# Patient Record
Sex: Male | Born: 1935 | Race: White | Hispanic: No | Marital: Married | Smoking: Never smoker
Health system: Southern US, Community
[De-identification: ages and names within clinical notes are randomized; demographics above are authoritative.]

## PROBLEM LIST (undated history)

## (undated) DIAGNOSIS — K5790 Diverticulosis of intestine, part unspecified, without perforation or abscess without bleeding: Secondary | ICD-10-CM

## (undated) DIAGNOSIS — K562 Volvulus: Secondary | ICD-10-CM

## (undated) DIAGNOSIS — D369 Benign neoplasm, unspecified site: Secondary | ICD-10-CM

## (undated) DIAGNOSIS — N189 Chronic kidney disease, unspecified: Secondary | ICD-10-CM

## (undated) DIAGNOSIS — I4891 Unspecified atrial fibrillation: Secondary | ICD-10-CM

## (undated) DIAGNOSIS — I509 Heart failure, unspecified: Secondary | ICD-10-CM

## (undated) DIAGNOSIS — C61 Malignant neoplasm of prostate: Secondary | ICD-10-CM

## (undated) DIAGNOSIS — K602 Anal fissure, unspecified: Secondary | ICD-10-CM

## (undated) DIAGNOSIS — D7389 Other diseases of spleen: Secondary | ICD-10-CM

## (undated) DIAGNOSIS — K219 Gastro-esophageal reflux disease without esophagitis: Secondary | ICD-10-CM

## (undated) DIAGNOSIS — D649 Anemia, unspecified: Secondary | ICD-10-CM

## (undated) DIAGNOSIS — M1990 Unspecified osteoarthritis, unspecified site: Secondary | ICD-10-CM

## (undated) DIAGNOSIS — D739 Disease of spleen, unspecified: Secondary | ICD-10-CM

## (undated) DIAGNOSIS — I639 Cerebral infarction, unspecified: Secondary | ICD-10-CM

## (undated) DIAGNOSIS — I739 Peripheral vascular disease, unspecified: Secondary | ICD-10-CM

## (undated) DIAGNOSIS — K648 Other hemorrhoids: Secondary | ICD-10-CM

## (undated) DIAGNOSIS — M436 Torticollis: Secondary | ICD-10-CM

## (undated) HISTORY — DX: Malignant neoplasm of prostate: C61

## (undated) HISTORY — PX: HERNIA REPAIR: SHX51

## (undated) HISTORY — DX: Gastro-esophageal reflux disease without esophagitis: K21.9

## (undated) HISTORY — DX: Benign neoplasm, unspecified site: D36.9

## (undated) HISTORY — DX: Unspecified osteoarthritis, unspecified site: M19.90

## (undated) HISTORY — DX: Other diseases of spleen: D73.89

## (undated) HISTORY — PX: COLONOSCOPY: SHX174

## (undated) HISTORY — DX: Chronic kidney disease, unspecified: N18.9

## (undated) HISTORY — DX: Peripheral vascular disease, unspecified: I73.9

## (undated) HISTORY — PX: HEMORRHOID SURGERY: SHX153

## (undated) HISTORY — PX: JOINT REPLACEMENT: SHX530

## (undated) HISTORY — DX: Other hemorrhoids: K64.8

## (undated) HISTORY — DX: Diverticulosis of intestine, part unspecified, without perforation or abscess without bleeding: K57.90

## (undated) HISTORY — PX: TONSILLECTOMY: SHX5217

## (undated) HISTORY — PX: LUMBAR LAMINECTOMY: SHX95

## (undated) HISTORY — DX: Torticollis: M43.6

## (undated) HISTORY — DX: Disease of spleen, unspecified: D73.9

## (undated) HISTORY — DX: Anal fissure, unspecified: K60.2

---

## 1997-10-22 ENCOUNTER — Other Ambulatory Visit: Admission: RE | Admit: 1997-10-22 | Discharge: 1997-10-22 | Payer: Self-pay | Admitting: Cardiovascular Disease

## 1997-12-01 ENCOUNTER — Other Ambulatory Visit: Admission: RE | Admit: 1997-12-01 | Discharge: 1997-12-01 | Payer: Self-pay | Admitting: Cardiovascular Disease

## 1998-04-12 DIAGNOSIS — K5790 Diverticulosis of intestine, part unspecified, without perforation or abscess without bleeding: Secondary | ICD-10-CM

## 1998-04-12 HISTORY — DX: Diverticulosis of intestine, part unspecified, without perforation or abscess without bleeding: K57.90

## 1998-09-06 ENCOUNTER — Encounter: Payer: Self-pay | Admitting: Orthopedic Surgery

## 1998-09-08 ENCOUNTER — Inpatient Hospital Stay (HOSPITAL_COMMUNITY): Admission: RE | Admit: 1998-09-08 | Discharge: 1998-09-15 | Payer: Self-pay | Admitting: Orthopedic Surgery

## 1998-09-08 ENCOUNTER — Encounter: Payer: Self-pay | Admitting: Orthopedic Surgery

## 1999-11-16 ENCOUNTER — Other Ambulatory Visit: Admission: RE | Admit: 1999-11-16 | Discharge: 1999-11-16 | Payer: Self-pay | Admitting: Urology

## 2000-02-12 ENCOUNTER — Encounter: Payer: Self-pay | Admitting: General Surgery

## 2000-02-13 ENCOUNTER — Encounter (INDEPENDENT_AMBULATORY_CARE_PROVIDER_SITE_OTHER): Payer: Self-pay

## 2000-02-13 ENCOUNTER — Ambulatory Visit (HOSPITAL_COMMUNITY): Admission: RE | Admit: 2000-02-13 | Discharge: 2000-02-13 | Payer: Self-pay | Admitting: General Surgery

## 2000-05-03 ENCOUNTER — Encounter: Payer: Self-pay | Admitting: Orthopedic Surgery

## 2000-05-08 ENCOUNTER — Encounter: Payer: Self-pay | Admitting: Orthopedic Surgery

## 2000-05-08 ENCOUNTER — Inpatient Hospital Stay (HOSPITAL_COMMUNITY): Admission: RE | Admit: 2000-05-08 | Discharge: 2000-05-14 | Payer: Self-pay | Admitting: Orthopedic Surgery

## 2000-07-10 ENCOUNTER — Encounter: Admission: RE | Admit: 2000-07-10 | Discharge: 2000-10-08 | Payer: Self-pay | Admitting: Orthopedic Surgery

## 2005-08-09 ENCOUNTER — Encounter: Admission: RE | Admit: 2005-08-09 | Discharge: 2005-08-09 | Payer: Self-pay | Admitting: Neurology

## 2005-09-17 ENCOUNTER — Encounter: Admission: RE | Admit: 2005-09-17 | Discharge: 2005-09-17 | Payer: Self-pay | Admitting: Family Medicine

## 2005-09-20 ENCOUNTER — Encounter: Admission: RE | Admit: 2005-09-20 | Discharge: 2005-09-20 | Payer: Self-pay | Admitting: Family Medicine

## 2005-10-08 ENCOUNTER — Inpatient Hospital Stay (HOSPITAL_COMMUNITY): Admission: RE | Admit: 2005-10-08 | Discharge: 2005-10-09 | Payer: Self-pay | Admitting: Neurosurgery

## 2007-03-24 ENCOUNTER — Ambulatory Visit (HOSPITAL_COMMUNITY): Admission: RE | Admit: 2007-03-24 | Discharge: 2007-03-24 | Payer: Self-pay | Admitting: Cardiovascular Disease

## 2007-07-29 ENCOUNTER — Encounter (HOSPITAL_BASED_OUTPATIENT_CLINIC_OR_DEPARTMENT_OTHER): Admission: RE | Admit: 2007-07-29 | Discharge: 2007-09-04 | Payer: Self-pay | Admitting: Surgery

## 2007-12-03 ENCOUNTER — Encounter: Admission: RE | Admit: 2007-12-03 | Discharge: 2007-12-03 | Payer: Self-pay | Admitting: Family Medicine

## 2009-01-14 ENCOUNTER — Encounter: Admission: RE | Admit: 2009-01-14 | Discharge: 2009-01-14 | Payer: Self-pay | Admitting: Family Medicine

## 2009-03-10 ENCOUNTER — Ambulatory Visit (HOSPITAL_COMMUNITY): Admission: RE | Admit: 2009-03-10 | Discharge: 2009-03-10 | Payer: Self-pay | Admitting: Cardiovascular Disease

## 2009-07-11 ENCOUNTER — Encounter (HOSPITAL_BASED_OUTPATIENT_CLINIC_OR_DEPARTMENT_OTHER): Admission: RE | Admit: 2009-07-11 | Discharge: 2009-09-12 | Payer: Self-pay | Admitting: Internal Medicine

## 2009-08-29 ENCOUNTER — Encounter (HOSPITAL_COMMUNITY): Admission: RE | Admit: 2009-08-29 | Discharge: 2009-11-27 | Payer: Self-pay | Admitting: Urology

## 2009-09-26 ENCOUNTER — Encounter: Admission: RE | Admit: 2009-09-26 | Discharge: 2009-09-26 | Payer: Self-pay | Admitting: Orthopaedic Surgery

## 2010-03-01 ENCOUNTER — Emergency Department (HOSPITAL_COMMUNITY): Admission: EM | Admit: 2010-03-01 | Discharge: 2010-03-01 | Payer: Self-pay | Admitting: Emergency Medicine

## 2010-04-20 ENCOUNTER — Encounter
Admission: RE | Admit: 2010-04-20 | Discharge: 2010-04-27 | Payer: Self-pay | Source: Home / Self Care | Attending: Physical Medicine & Rehabilitation | Admitting: Physical Medicine & Rehabilitation

## 2010-04-27 ENCOUNTER — Ambulatory Visit: Payer: Self-pay | Admitting: Physical Medicine & Rehabilitation

## 2010-05-04 ENCOUNTER — Ambulatory Visit (HOSPITAL_COMMUNITY)
Admission: RE | Admit: 2010-05-04 | Discharge: 2010-05-04 | Payer: Self-pay | Admitting: Physical Medicine & Rehabilitation

## 2010-07-07 ENCOUNTER — Encounter
Admission: RE | Admit: 2010-07-07 | Discharge: 2010-07-11 | Payer: Self-pay | Source: Home / Self Care | Attending: Physical Medicine & Rehabilitation | Admitting: Physical Medicine & Rehabilitation

## 2010-07-11 ENCOUNTER — Ambulatory Visit
Admission: RE | Admit: 2010-07-11 | Discharge: 2010-07-11 | Payer: Self-pay | Source: Home / Self Care | Attending: Physical Medicine & Rehabilitation | Admitting: Physical Medicine & Rehabilitation

## 2010-08-15 ENCOUNTER — Ambulatory Visit: Payer: Medicare Other | Admitting: Physical Medicine & Rehabilitation

## 2010-08-15 ENCOUNTER — Encounter: Payer: Medicare Other | Attending: Physical Medicine & Rehabilitation

## 2010-08-15 DIAGNOSIS — G243 Spasmodic torticollis: Secondary | ICD-10-CM | POA: Insufficient documentation

## 2010-08-15 DIAGNOSIS — R259 Unspecified abnormal involuntary movements: Secondary | ICD-10-CM | POA: Insufficient documentation

## 2010-09-07 LAB — DIFFERENTIAL
Basophils Absolute: 0 10*3/uL (ref 0.0–0.1)
Basophils Relative: 1 % (ref 0–1)
Eosinophils Absolute: 0 10*3/uL (ref 0.0–0.7)
Monocytes Absolute: 0.3 10*3/uL (ref 0.1–1.0)
Monocytes Relative: 8 % (ref 3–12)
Neutrophils Relative %: 67 % (ref 43–77)

## 2010-09-07 LAB — CK TOTAL AND CKMB (NOT AT ARMC): Total CK: 182 U/L (ref 7–232)

## 2010-09-07 LAB — BRAIN NATRIURETIC PEPTIDE: Pro B Natriuretic peptide (BNP): 38.7 pg/mL (ref 0.0–100.0)

## 2010-09-07 LAB — BASIC METABOLIC PANEL
CO2: 27 mEq/L (ref 19–32)
Calcium: 8.9 mg/dL (ref 8.4–10.5)
Chloride: 107 mEq/L (ref 96–112)
Creatinine, Ser: 1.29 mg/dL (ref 0.4–1.5)
GFR calc Af Amer: >60 mL/min (ref >60)
GFR calc non Af Amer: 53 mL/min — ABNORMAL LOW (ref >60)
Sodium: 141 mEq/L (ref 135–145)

## 2010-09-07 LAB — CBC
RBC: 3.91 MIL/uL — ABNORMAL LOW (ref 4.22–5.81)
RDW: 16.4 % — ABNORMAL HIGH (ref 11.5–15.5)
WBC: 4.1 10*3/uL (ref 4.0–10.5)

## 2010-09-19 ENCOUNTER — Encounter: Payer: Medicare Other | Attending: Physical Medicine & Rehabilitation

## 2010-09-19 ENCOUNTER — Ambulatory Visit: Payer: Medicare Other | Admitting: Physical Medicine & Rehabilitation

## 2010-09-19 DIAGNOSIS — G243 Spasmodic torticollis: Secondary | ICD-10-CM

## 2010-09-19 DIAGNOSIS — M542 Cervicalgia: Secondary | ICD-10-CM

## 2010-09-19 DIAGNOSIS — R259 Unspecified abnormal involuntary movements: Secondary | ICD-10-CM | POA: Insufficient documentation

## 2010-09-26 NOTE — Procedures (Signed)
Eduardo Deleon, ROSKOS NO.:  [id number]  MEDICAL RECORD NO.:  [id number]           PATIENT TYPE:  LOCATION:                                 FACILITY:  PHYSICIAN:  Erick Colace, M.D.DATE OF BIRTH:  1925-06-23  DATE OF PROCEDURE: DATE OF DISCHARGE:                              OPERATIVE REPORT  This is an EMG report.  This EMG without nerve conduction studies is done on multiple cervical musculature.  This is done for the purposes of evaluating cervical dystonia and planning for further injections.  He did not have good response to Dysport injection 300 units, further mapping was obtained.  Following, muscles were studied, right sternocleidomastoid 1+ motor unit action potential activity, left 3+; left scalenes were rated as 2+, right levator scapula 1+, left levator scapula 3+; right splenius capitis 1+, left 3+; and right trapezius 2+, left trapezius 2+.  No signs of abnormal spontaneous activity but this was muscle activity when the patient was trying to be relaxed.  His head tends to deviate downward toward the left side and lateral collis  to left side as well.  The patient will be treated with Botox at next visit.  I discussed with the patient, he agrees with plan, we will plan 200 units.  75 Units L SCM, 50 Units L scalenes, 25 Units L Splenius capitus, 50 Units L Levator scapula     Erick Colace, M.D. Electronically Signed    AEK/MEDQ  D:  09/19/2010 16:53:10  T:  09/20/2010 00:42:23  Job:  [id number]

## 2010-09-29 LAB — PROTIME-INR: Prothrombin Time: 26 seconds — ABNORMAL HIGH (ref 11.6–15.2)

## 2010-11-07 NOTE — Op Note (Signed)
NAMEDEQUAN, KINDRED NO.:  [id number]   MEDICAL RECORD NO.:  [id number]          PATIENT TYPE:  OIB   LOCATION:  2899                         FACILITY:  [facility]   PHYSICIAN:  Richard A. Alanda Amass, M.D.DATE OF BIRTH:  May 16, 1925   DATE OF PROCEDURE:  03/24/2007  DATE OF DISCHARGE:  03/24/2007                               OPERATIVE REPORT   PROCEDURE:  DC cardioversion under IV pentathol anesthesia administered  by Dr. Randa Evens anesthesiologist.   HISTORY:  Mr. Gerstner is an 77 year old white married gentleman  without children.  He is a nonsmoker and was initially seen by me for  evaluation of tachycardia and LV dysfunction with new onset hypertension  and abnormal 2D echo on October 16, 2006.  He was found to be in atrial  flutter with variable response.  He had symptoms of episodic mild  dyspnea and exercise intolerance associated with this.  No chest pain,  syncope or presyncope.  He has had a remote CVA in 1987 under the care  of Dr. Avie Echevaria and had been chronic aspirin and there has been no  recurrences.  A 2D echo in April 2008 showed EF of 35% to 45% with AF  with global hypokinesia, moderate atrial enlargement, and DUST with no  significant valvular disease and mild pulmonary hypertension.  He was  anticoagulated and treated with rate control and seen back as an  outpatient November 26, 2006.  It was elected to proceed with a DC  cardioversion in this setting with atrial flutter-fibrillation.  Length  unknown.  Symptoms as outlined above.  LV dysfunction to be probably  rate related.  The patient has been on adequate anticoagulant documented  as an outpatient.  Had been on digoxin 0.25, Coumadin, Cardizem CD 240,  one aspirin, Flomax, Toprol-XL 25 mg.  Informed consent had been  obtained to proceed with the DC cardioversion.  Physical examination had  not changed.  Blood pressure was stable at 120 to 130.  He was in atrial  fibrillation with ventricular  response of 80 to 90 per minute.  Fluttered waves were not visualized.   He was given a 125 mg of IV pentathol by Dr. Randa Evens for sedation.  A  single shock of 150 joules biphasic with AP paddles was given and  synchronized.  The patient converted to sinus rhythm at 65 per minute.  He awoke in outpatient area without any neurologic sequelae and  tolerated the procedure well.   He will be followed as an outpatient.  We will cut him down to two baby  aspirin a day since he is on Coumadin and continue to follow him as an  outpatient with medication adjustment as outlined.  We will plan to get  followup LV function if he maintains sinus rhythm in the hopes that this  will improve.      Richard A. Alanda Amass, M.D.  Electronically Signed     RAW/MEDQ  D:  03/24/2007  T:  03/25/2007  Job:  [id number]   cc:   [address] Dr.

## 2010-11-07 NOTE — Assessment & Plan Note (Signed)
[facility]   NAME:  Eduardo, Deleon             ACCOUNT NO.:  [id number]   MEDICAL RECORD NO.:  [id number]      DATE OF BIRTH:  Nov 01, 1924   PHYSICIAN:  Theresia Majors. Tanda Rockers, M.D. VISIT DATE:  08/14/2007                                   OFFICE VISIT   SUBJECTIVE:  [name] is an 77 year old man whom we have treated  for stasis ulcer involving his left lower extremity.  He in the interim  he has worn compressive wrap.  There is been no fever or drainage.   OBJECTIVE:  Blood pressure is 128/80, respirations 18, pulse rate 110,  temperature 98.3.  Inspection of the left lower extremity shows that the ulcers completely  resolved.  There remains a 1 to 2+ edema.  There is no inflammation and  no evidence of ischemia.   ASSESSMENT:  Resolved stasis ulcer.   PLAN:  We have made the transition of the patient from the compressive  wrap to of bilateral open toed compression garment of 30-40 mm of  compression to a 20-30 mm gradient.  We will discharge him from active  management in the [facility] and encourage him to keep his follow-up  with his primary care physician Dr. Coralee Pesa.      [name], M.D.  Electronically Signed     HAN/MEDQ  D:  08/14/2007  T:  08/15/2007  Job:  [id number]

## 2010-11-07 NOTE — Consult Note (Signed)
NAMECARON, TARDIF NO.:  [id number]   MEDICAL RECORD NO.:  [id number]          PATIENT TYPE:  REC   LOCATION:  FOOT                         FACILITY:  [facility]   PHYSICIAN:  Theresia Majors. Tanda Rockers, M.D.DATE OF BIRTH:  06-10-1925   DATE OF CONSULTATION:  07/31/2007  DATE OF DISCHARGE:                                 CONSULTATION   SUBJECTIVE:  [name] is an 77 year old man referred by Dr.  Leodis Rains for evaluation of nonhealing wound on the  medial left lower extremity.   IMPRESSION:  Stasis ulceration.   RECOMMENDATIONS:  Proceed with a compression multilayered wrap with  triamcinolone ointment and reevaluation of the patient in one week.   SUBJECTIVE:  [name] is an 77 year old man who noticed severe  swelling and erythema approximately 3 weeks ago.  This area was  subsequently associated with an ulceration and drainage.  There has been  no fever.  There has been no acknowledged trauma.   PAST MEDICAL HISTORY:  1. Chronic atrial fibrillation.  2. Hypertension.  3. Arthritis.   ALLERGIES:  He denies allergies.   CURRENT MEDICATIONS:  1. Avodart 0.5 mg daily.  2. Toprol XL 50 mg daily.  3. Omeprazole 20 mg daily.  4. Lanoxin 0.25 mg daily.  5. Coumadin 7 mg daily.  6. Aspirin 81 mg daily.  7. Diclofenac sodium 50 mg daily.  8. Fosamax 70 mg weekly.  9. Over-the-counter medications include Centrum Silver, calcium,      Metamucil and Colace.   PAST SURGICAL HISTORY:  1. Bilateral hip replacements for degenerative disease.  2. He has had a cardioversion.  3. Herniorrhaphy.   FAMILY HISTORY:  Positive for hypertension, negative for diabetes,  negative for cancer, positive for strokes.   SOCIAL HISTORY:  He is married.  He has no children.  He had his wife  live in [place].   REVIEW OF SYSTEMS:  The patient is relatively active.  He is able to  walk four blocks without difficulty.  He is able to negotiate a flight  of  stairs.  He denies syncope.  He did have some visual impairments  attributed to a mini stroke approximately 6 years ago.  The onset of his  atrial fibrillation was this past October.  He has never smoked.  He  denies hemoptysis.  His appetite is good.  His weight is stable.  He  specifically denies angina pectoris.  His bowel and bladder function are  normal.  His total knee replacements have resulted in relief of all of  his previously bothersome knee pain.  The remainder of his review of  systems is negative.   PHYSICAL EXAMINATION:  GENERAL:  He is an alert pleasant man in no acute  distress.  He is in good contact with reality.  VITAL SIGNS:  Blood pressure is 132/84, respirations 18, temperature is  98, pulse rate is 110 and irregularly irregular.  HEENT:  Exam is clear.  There are prominent V-waves.  Trachea is  midline.  Thyroid is nonpalpable.  LUNGS:  Clear.  HEART:  Sounds were distant with irregularity noted.  ABDOMEN:  Soft with no tenderness.  EXTREMITIES:  The extremity exam is remarkable for bilateral 2+ edema  with chronic changes of stasis the dorsalis pedis pulse is palpable  bilaterally.  On the left medial ankle is a full-thickness ulceration  with a halo of erythema and associated desquamation.  Protective  sensation is preserved as judged by the Semmes-Weinstein filament.   DISCUSSION:  [name] has a classic stasis ulcer with  compression.  We have given him a prescription for 30-40 mm below-the-  knee compression hose.  We have instructed him to procure the hose and  to begin wearing it on the right lower extremity, we anticipate that his  left stasis ulcer should respond to compression therapy.  We will  reevaluate him in one week.  We have given him an opportunity to ask  questions.  He seems to understand and expresses gratitude for having  been seen in the clinic.      [name], M.D.  Electronically Signed     HAN/MEDQ  D:   07/31/2007  T:  08/01/2007  Job:  [id number]   cc:   Samara Snide, MD

## 2010-11-07 NOTE — Assessment & Plan Note (Signed)
[facility]   NAME:  CLAYSON, RILING             ACCOUNT NO.:  [id number]   MEDICAL RECORD NO.:  [id number]      DATE OF BIRTH:  1925-02-12   PHYSICIAN:  Theresia Majors. Tanda Rockers, M.D. VISIT DATE:  08/07/2007                                   OFFICE VISIT   SUBJECTIVE:  Mr. Ditmer is a 77 year old man who we are seeing for  stasis ulcer involving the medial aspect of his left lower extremity.  In the interim he has worn a compressive wrap.  He repots that there has  been no drainage, mal odor, pain or fever.   OBJECTIVE:  Blood pressure 138/78, respirations 18, pulse rate 101,  temperature 97.7.  Inspection of the left medial ankle shows that there is a miniscule  residual ulcer with no evidence of inflammation or infection.  Capillary  refill is brisk.   ASSESSMENT:  Clinical response to compression.   PLAN:  We will resume the compression wrap with PCA ointment.  We will  reevaluate the patient in one week p.r.n.  In the interim the patient  has procured the recommended 30 to 40 mm below the knee compression  hose.  He will bring these stockings with him to his next visit, and  hopefully he will make the transition.      [name], M.D.  Electronically Signed     HAN/MEDQ  D:  08/07/2007  T:  08/08/2007  Job:  [id number]

## 2010-11-10 NOTE — Op Note (Signed)
[place]  Patient:    Eduardo Deleon, Eduardo Deleon                    MRN: [id number] Proc. Date: 05/08/00 Adm. Date:  [id number] Attending:  Colbert Ewing                           Operative Report  PREOPERATIVE DIAGNOSIS:  End-stage degenerative joint disease, right knee, with varus alignment and mild flexion contracture.  POSTOPERATIVE DIAGNOSIS:  End-stage degenerative joint disease, right knee, with varus alignment and mild flexion contracture.  PROCEDURE:  Right total knee replacement utilizing [employer] prosthesis. Press-fit cruciate-retaining #9 femoral component.  Cemented #9 tibial component with 15 mm polyethylene insert.  Cemented recessed non-metal backed 30 mm patellar component.  Appropriate soft tissue balancing with lateral retinacular release.  SURGEON:  Loreta Ave, M.D.  ASSISTANT:  Arlys Jarrett D. Petrarca, P.A.-C.  ANESTHESIA:  General.  ESTIMATED BLOOD LOSS:  Minimal.  TOURNIQUET TIME:  1 hour 10 minutes.  SPECIMENS:  Excised bone and soft tissue.  CULTURES:  None.  COMPLICATIONS:  None.  DRESSING:  Soft compressive.  DRAIN:  Hemovac x 2.  DESCRIPTION OF PROCEDURE:  Patient brought to the operating room and placed on the operating table in the supine position.  After adequate anesthesia had been obtained, the right knee was examined.  A 5 degree flexion contracture, further flexion to 100 degrees.  Alignment in varus, barely correctable to neutral.  Lateral patellofemoral tracking and tethering.  Tourniquet applied, prepped and draped in the usual sterile fashion.  Exsanguinated with elevation and Esmarch, tourniquet inflated to 350 mmHg.  Straight incision above the patella down to the tibial tubercle.  Medial parapatellar arthrotomy, exposing the knee.  Reactive clear effusion.  The knee exposed.  Remnants of menisci, anterior cruciate ligament, intra-articular adhesions all excised.  Extensive periarticular  spurs all debrided back to normal contour of the bony structures.  Distal femur exposed.  Extramedullary guide placed.  Distal cut set at 5 degrees of valgus, removing 10 mm.  Sized for a #9 component.  Jig was put in place, definitive cuts made for the cruciate-retaining #9 component.  Trial put in place and found to fit well.  Trial removed.  Tibial spine cut smoothly with a saw.  Posterior cruciate ligament and popliteal tendon retained.  The intramedullary guide placed in the tibia. Proximal cut, removing 4 mm off the deficient medial side with a 5 degree posterior slope cut.  Trials put in place.  A #9 above and below with a 15 mm insert, which gave the knee full extension, full flexion, and good stability after I did appropriate soft tissue release medially.  Tibia marked for rotation.  It was then hand-reamed for the definitive component.  Trials were removed.  Patella was then sized, reamed, and drilled for the 30 mm component.  All trials put in place.  Full extension, full flexion, nicely balanced knee at 5 degrees of valgus. Lateral tracking and tethering, requiring lateral release, which was performed with cautery from inside out.  Instruments and fluid removed.  The knee was then copiously irrigated with a pulse irrigating device.  Cement prepared and placed on tibial component, which was hammered in place. Polyethylene attached after excessive cement removed.  Patellar component was well-seated and cemented with excessive cement removed.  Femoral component hammered in place.  The knee was held in good position until the cement  hardened.  It was then re-examined.  Full extension, full flexion, and excellent stability.  No component lift-off with flexion.  Good patellofemoral tracking after lateral release.  Wound irrigated.  Hemovac was placed, brought out through separate stab wounds.  Arthrotomy closed with #1 Vicryl, and skin and subcutaneous tissue with Vicryl and staples.   Margins of the wound as well as the knee injected with Marcaine.  A sterile compressive dressing applied. Tourniquet deflated and removed.  Knee immobilizer applied.  Anesthesia reversed and brought to the recovery room.  He tolerated the surgery well with no complications. DD:  05/08/00 TD:  05/08/00 Job: [id number]

## 2010-11-10 NOTE — Op Note (Signed)
[facility]  Patient:    Eduardo Deleon, Eduardo Deleon                    MRN: [id number] Proc. Date: 02/13/00 Adm. Date:  [id number] Disc. Date: [id number] Attending:  Carson Myrtle                           Operative Report  PREOPERATIVE DIAGNOSIS:  Internal and external hemorrhoids, anal fissure.  POSTOPERATIVE DIAGNOSIS:  Internal and external hemorrhoids, anal fissure.  PROCEDURE:  Examination under anesthesia, repair of anal fissure, and hemorrhoidectomy.  SURGEON:  Timothy E. Earlene Plater, M.D.  ANESTHESIA:  General LMA.  INDICATIONS:  The patient has had a difficult time in the past year with anorectal complaints and problems.  He has been treated conservatively in the office with both medication, diet alteration, local wound care, and minor surgeries in the office.  He has continued to have pain, discomfort, and bleeding, and because of the persistence of an anal fissure and failure of the resumption to normal activity, resumption of healing, and the persistence of hemorrhoids, he has elected to proceed with surgery and risks were carefully discussed.  DESCRIPTION OF PROCEDURE:  The patient was brought to the operating room after prep at home, placed supine, and general LMA anesthesia provided.  He was placed in the lithotomy position.  The perianal area inspected, prepped and draped in the usual fashion.  Prominent hemorrhoids were noted to the right and left posterior position off the posterior midline fissure.  Each was thrombosed and quite large.  The fissure itself was acute and angry.  I was surprised that the anus and sphincters relaxed satisfactorily.  I injected Marcaine 0.5% with epinephrine around and about the anal orifice and massaged it in well.  There was no other pathology around the anorectum.  Each of the two hemorrhoids was removed in a similar fashion by placing a suture at the apex of 2-0 chromic, removing the external complex, and  then closing the wound with a running 2-0 chromic at the outside skin.  I was able to do this to remove the hemorrhoidal masses, and leave the skin aligning the fissure intact.  I did not want to remove a complex hemorrhoid on either side of the fissure which would have left the posterior of the anus bare and without mucosa.  However, this was accomplished satisfactorily.  There was skin anoderm and mucosa surrounding the fissure.  It was cauterized out to the perianal skin.  Because of the normal relaxation, I did not do a sphincterotomy in this elderly man.  Again, there was no other pathology, no bleeding, or complications.  Gelfoam gauze and a dry sterile dressing applied. He tolerated it well, was awakened, and taken to the recovery room in good condition.  Written and verbal instructions including Demerol were given to him, and he will be followed as an outpatient. DD:  02/15/00 TD:  02/16/00 Job: [id number]

## 2010-11-10 NOTE — Discharge Summary (Signed)
[place]. [facility]  Patient:    JOSETH, WEIGEL                    MRN: [id number] Adm. Date:  [id number] Disc. Date: [id number] Attending:  Colbert Ewing                           Discharge Summary  ADMISSION DIAGNOSIS:  Advanced degenerative joint disease of the right knee.  DISCHARGE DIAGNOSES: 1. Advanced degenerative joint disease of the right knee. 2. History of hypertension. 3. Constipation.  PROCEDURE:  Total knee replacement on the right.  HISTORY:  A 77 year old white male who has had long-standing difficulty with his right knee with pain/discomfort.  He is noted to have advanced DJD of the right knee radiographically.  He has been tried with conservative treatment but because of persistent pain and discomfort he is now indicated for total replacement.  HOSPITAL COURSE:  A 77 year old white male admitted May 08, 2000.  After appropriate laboratory studies were obtained patient was taken to the operating room where he underwent a right total knee replacement.  He tolerated the procedure well.  He was placed pre and postoperatively on Ancef prophylaxis.  Heparin and Coumadin antithrombotic prophylaxis was also ordered postoperatively ______ by [facility].  PT/OT rehabilitation consults were ordered.  Ambulation, weightbearing as tolerated on the right.  A PCA pump was used for postoperative pain as well as a femoral nerve block.  He had excellent relief in the first several days postoperative with his pain management.  He otherwise had an unremarkable hospital course and was discharged on the twentieth to return back to the office in follow-up in 10 days.  LABORATORIES:  EKG showed normal sinus rhythm with marked sinus arrhythmia with short TR, possible anterior infarct, age undetermined.  Radiographic studies showed satisfactory postoperative appearance of right knee. Preoperative hemoglobin 14.8, hematocrit 42.2%, white  count 7500, platelets 190,000.  Discharge hemoglobin 10.3, hematocrit 29.1%.  Chemistries preoperative:  Sodium 141, potassium 4.0, chloride 104, CO2 29, glucose 122, BUN 14, creatinine 1.1, calcium 9.6, total protein 6.8, albumin 4.0, AST 21, ALT 14, ALP 67, total bilirubin 1.0.  Discharge sodium 133, potassium 4.1, chloride 101, CO2 26, glucose 117, BUN 17, creatinine 0.8, calcium 7.8. Urinalysis was done for voided urine.  Blood type A-.  Antibody screen negative.  DISCHARGE INSTRUCTIONS:  Given prescription for Percocet 5/325 one to two q.4h. p.r.n. pain.  Iron sulfate 325 one p.o. q.d. with meals.  Coumadin 5 mg as directed by [facility] pharmacy.  Weightbearing as tolerated on the right. Regular house diet.  Keep the incision clean and dry.  Change the dressing daily.  Follow up in the office in 10 days for follow-up.  Call if there is any problem in the interim.  CONDITION ON DISCHARGE:  Improved. DD:  05/22/00 TD:  05/22/00 Job: [id number]

## 2010-11-10 NOTE — Op Note (Signed)
NAMEMARILYN, Deleon NO.:  [id number]   MEDICAL RECORD NO.:  [id number]          PATIENT TYPE:  INP   LOCATION:  3041                         FACILITY:  [facility]   PHYSICIAN:  Kathaleen Maser. Pool, M.D.    DATE OF BIRTH:  Dec 09, 1924   DATE OF PROCEDURE:  10/08/2005  DATE OF DISCHARGE:                                 OPERATIVE REPORT   PREOPERATIVE DIAGNOSES:  1.  Right T12-L1 herniated nucleus pulposus with radiculopathy.  2.  L3-4 stenosis.   POSTOPERATIVE DIAGNOSES:  1.  Right T12-L1 herniated nucleus pulposus with radiculopathy.  2.  L3-4 stenosis.   PROCEDURES:  1.  Right T12-L1 transpedicular microdiskectomy.  2.  Bilateral L3-4 decompressive laminotomies and foraminotomies.   SURGEON:  Kathaleen Maser. Pool, M.D.   ASSISTANT:  Donalee Citrin, M.D.   ANESTHESIA:  General orotracheal.   INDICATIONS:  [name] is a 77 year old male with a history of back  and lower extremity pain, paresthesias and weakness consistent with a mixed  problem.  Workup has demonstrated evidence of severe stenosis at L3-4.  The  patient also has evidence of a large right T12-L1 acute disk herniation.  We  have discussed options available for management, including the possibility  of undergoing  a microdiskectomy at T12-L1 and a lumbar decompression at L3-  4.  The patient is aware of the risks and benefits and wishes to proceed.   OPERATIVE NOTE:  The patient was taken to the operating room and placed on  the operating table in supine position.  After an adequate level of  anesthesia was achieved, the patient was positioned prone onto a Wilson  frame and appropriately padded.  The lumbar region was prepped and draped  sterilely.  A 10 blade was used to make a linear skin incision overlying the  L3-4 and T12-L1 levels.  Subperiosteal dissection then performed bilaterally  at L3-4 and on the right at T12-L1.  A deep self-retaining retractor was  placed, x-ray was taken and levels were  confirmed.  Starting at L3-4, a  decompressive laminotomy was then performed using a high-speed drill and  Kerrison used to remove the inferior aspect of the lamina of L3, medial  aspect of the L3-4 facet joint, and the superior rim of the L4 lamina.  The  ligamentum flavum was then elevated and resected in a piecemeal fashion  using the Kerrison rongeurs.  The facet joints were then undercut using the  microscope for wide foraminotomies of the exiting L4 nerve root.  This fully  decompressed the thecal sac and nerve roots.  There was no evidence of any  residual compression.  Attention then placed to the right side at T12-L1.  Laminotomy was then performed using high-speed drill and Kerrison rongeurs  to remove the inferior aspect of the lamina of T12, medial aspect of the T12-  L1 facet joint and the superior rim of the L1 lamina as well as the superior  aspect of the L1 pedicle.  Microscope brought into the field and used for  microdissection of the spinal canal.  The thecal sac was gently mobilized  and retracted toward the midline.  The disk herniation was readily apparent.  This was incised with a 15 blade in a rectangular fashion.  A wide disk  space clean-out was achieved using pituitary rongeurs, upward-angled  pituitary rongeurs and Epstein curettes.  All elements of the disk were  completely resected.  At this point a very thorough decompression had been  achieved.  There was no evidence of injury to the thecal sac or nerve roots.  Both wounds were then copiously irrigated with antibiotic solution.  Gelfoam  was placed topically for hemostasis.  The microscope and retractor system  were removed.  Hemostasis in the muscle was achieved with electrocautery.  The wound was then closed in layers with Vicryl sutures.  Steri-Strips and  sterile dressing were applied.  There were no apparent complications.  The  patient tolerated the procedure well, and he returns to the recovery room   postoperatively.           ______________________________  Kathaleen Maser Pool, M.D.     HAP/MEDQ  D:  10/08/2005  T:  10/08/2005  Job:  [id number]

## 2010-11-21 ENCOUNTER — Ambulatory Visit: Payer: Medicare Other | Admitting: Physical Medicine & Rehabilitation

## 2010-11-21 ENCOUNTER — Encounter: Payer: Medicare Other | Attending: Physical Medicine & Rehabilitation

## 2010-11-21 DIAGNOSIS — G243 Spasmodic torticollis: Secondary | ICD-10-CM | POA: Insufficient documentation

## 2010-11-21 DIAGNOSIS — R259 Unspecified abnormal involuntary movements: Secondary | ICD-10-CM | POA: Insufficient documentation

## 2010-11-22 NOTE — Procedures (Signed)
NAMELADONTE, VERSTRAETE NO.:  [id number]  MEDICAL RECORD NO.:  [id number]           PATIENT TYPE:  O  LOCATION:  TPC                          FACILITY:  [facility]  PHYSICIAN:  Erick Colace, M.D.DATE OF BIRTH:  11-Aug-1924  DATE OF PROCEDURE: DATE OF DISCHARGE:                              OPERATIVE REPORT  Botulinum toxin injection.  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  He has a left laterocollis primarily.  Informed consent was obtained after describing risks and benefits of the procedure with the patient.  These include bleeding, bruising, and infection.  He has reviewed the REMS form, I answered questions personally.  He elects to proceed.  Also signed [facility] standard consent form.  The patient placed in a seated position.  Area marked and prepped with Betadine after pre-marking the areas.  Then a 30-gauge 1 inch needle electrode under needle EMG guidance was inserted.  Positive strong EMG activity obtained.  In the left levator scapula, 50 units injected.  Strong needle EMG activity noted at splenius capitis, 75 units injected, divided into 2 sites. Intermittent moderate EMG activity at the left sternocleidomastoid, 50 units and intermittent mild-to-moderate left scalene, 25 units injected. The patient tolerated the procedure well.  Postprocedure instructions given.  We will see her back in 4 weeks.  Consider response, maybe able to shift the scalene and sternocleidomastoid dosage into the splenius capitis and levator scapula, putting on 100 units into each of those two.     Erick Colace, M.D. Electronically Signed    AEK/MEDQ  D:  11/21/2010 16:39:21  T:  11/22/2010 03:38:47  Job:  [id number]

## 2010-12-19 ENCOUNTER — Ambulatory Visit: Payer: Medicare Other | Admitting: Physical Medicine & Rehabilitation

## 2011-02-08 ENCOUNTER — Inpatient Hospital Stay (INDEPENDENT_AMBULATORY_CARE_PROVIDER_SITE_OTHER)
Admission: RE | Admit: 2011-02-08 | Discharge: 2011-02-08 | Disposition: A | Discharge status: Home / Self Care | Payer: Medicare Other | Source: Ambulatory Visit | Attending: Family Medicine | Admitting: Family Medicine

## 2011-02-08 DIAGNOSIS — M47812 Spondylosis without myelopathy or radiculopathy, cervical region: Secondary | ICD-10-CM

## 2011-02-08 DIAGNOSIS — L97909 Non-pressure chronic ulcer of unspecified part of unspecified lower leg with unspecified severity: Secondary | ICD-10-CM

## 2011-02-20 ENCOUNTER — Encounter (HOSPITAL_BASED_OUTPATIENT_CLINIC_OR_DEPARTMENT_OTHER): Payer: Medicare Other | Attending: General Surgery

## 2011-02-20 DIAGNOSIS — Z79899 Other long term (current) drug therapy: Secondary | ICD-10-CM | POA: Insufficient documentation

## 2011-02-20 DIAGNOSIS — R609 Edema, unspecified: Secondary | ICD-10-CM | POA: Insufficient documentation

## 2011-02-20 DIAGNOSIS — Z8673 Personal history of transient ischemic attack (TIA), and cerebral infarction without residual deficits: Secondary | ICD-10-CM | POA: Insufficient documentation

## 2011-02-20 DIAGNOSIS — Z8546 Personal history of malignant neoplasm of prostate: Secondary | ICD-10-CM | POA: Insufficient documentation

## 2011-02-20 DIAGNOSIS — I4891 Unspecified atrial fibrillation: Secondary | ICD-10-CM | POA: Insufficient documentation

## 2011-02-20 DIAGNOSIS — I83009 Varicose veins of unspecified lower extremity with ulcer of unspecified site: Secondary | ICD-10-CM | POA: Insufficient documentation

## 2011-02-27 ENCOUNTER — Encounter (HOSPITAL_BASED_OUTPATIENT_CLINIC_OR_DEPARTMENT_OTHER): Payer: Medicare Other | Attending: General Surgery

## 2011-02-27 DIAGNOSIS — Z79899 Other long term (current) drug therapy: Secondary | ICD-10-CM | POA: Insufficient documentation

## 2011-02-27 DIAGNOSIS — Z8673 Personal history of transient ischemic attack (TIA), and cerebral infarction without residual deficits: Secondary | ICD-10-CM | POA: Insufficient documentation

## 2011-02-27 DIAGNOSIS — L97909 Non-pressure chronic ulcer of unspecified part of unspecified lower leg with unspecified severity: Secondary | ICD-10-CM | POA: Insufficient documentation

## 2011-02-27 DIAGNOSIS — R609 Edema, unspecified: Secondary | ICD-10-CM | POA: Insufficient documentation

## 2011-02-27 DIAGNOSIS — I83009 Varicose veins of unspecified lower extremity with ulcer of unspecified site: Secondary | ICD-10-CM | POA: Insufficient documentation

## 2011-02-27 DIAGNOSIS — I4891 Unspecified atrial fibrillation: Secondary | ICD-10-CM | POA: Insufficient documentation

## 2011-02-27 DIAGNOSIS — Z8546 Personal history of malignant neoplasm of prostate: Secondary | ICD-10-CM | POA: Insufficient documentation

## 2011-04-05 LAB — PROTIME-INR: INR: 2 — ABNORMAL HIGH

## 2011-04-05 LAB — APTT: aPTT: 39 — ABNORMAL HIGH

## 2011-04-17 NOTE — H&P (Signed)
  Eduardo Deleon, Eduardo Deleon NO.:  [id number]  MEDICAL RECORD NO.:  [id number]  LOCATION:  FOOT                         FACILITY:  [facility]  PHYSICIAN:  Joanne Gavel, M.D.        DATE OF BIRTH:  [birth year]  DATE OF ADMISSION:  02/20/2011 DATE OF DISCHARGE:                             HISTORY & PHYSICAL   CHIEF COMPLAINT:  Wound, right leg.  HISTORY OF PRESENT ILLNESS:  This is an 77 year old male, previously treated at this clinic for the same condition, returns with a varicose venous ulcer near the right lateral malleolus.  He was last treated for this similar condition in January 2011.  PAST MEDICAL HISTORY:  Significant for atrial fibrillation, shortness of breath, swelling of the ankles.  SOCIAL HISTORY:  Cigarettes none.  Alcohol none.  MEDICATIONS:  ASA, warfarin, omeprazole, Avodart, doxazosin, diclofenac, furosemide, and Klor-Con.  ALLERGIES:  None.  REVIEW OF SYSTEMS:  As above.  PAST SURGICAL HISTORY:  Hernia, bilateral total knee replacement, tonsillectomy, herniated disk.  He is status post CVA and prostate cancer.  PHYSICAL EXAMINATION:  VITAL SIGNS:  Temperature 98.6, pulse 80 and regular, respirations 18, blood pressure 123/77. GENERAL:  Well developed, has marked torticollis. CHEST:  Negative. HEART:  Negative. ABDOMEN:  Not examined. EXTREMITIES:  Reveal bilateral 2+ pitting edema.  On the right medial ankle, there is a superficial 0.2 x 0.2 area of weeping and marked redness, which has the appearance of a very slight abrasion.  Right peripheral pulses are palpable.  Normal sensation and motion.  IMPRESSION:  Varicose venous ulcer.  PLAN OF TREATMENT:  Profore Lite and padding.  We will see the patient in 7 days.     Joanne Gavel, M.D.     RA/MEDQ  D:  02/20/2011  T:  02/20/2011  Job:  [id number]  Electronically Signed by Joanne Gavel M.D. on 04/17/2011 09:13:46 AM

## 2011-05-17 ENCOUNTER — Emergency Department (HOSPITAL_COMMUNITY)
Admission: EM | Admit: 2011-05-17 | Discharge: 2011-05-17 | Disposition: A | Discharge status: Home / Self Care | Payer: Medicare Other | Attending: Emergency Medicine | Admitting: Emergency Medicine

## 2011-05-17 ENCOUNTER — Encounter: Payer: Self-pay | Admitting: *Deleted

## 2011-05-17 DIAGNOSIS — Z7901 Long term (current) use of anticoagulants: Secondary | ICD-10-CM | POA: Insufficient documentation

## 2011-05-17 DIAGNOSIS — Z79899 Other long term (current) drug therapy: Secondary | ICD-10-CM | POA: Insufficient documentation

## 2011-05-17 DIAGNOSIS — T148XXA Other injury of unspecified body region, initial encounter: Secondary | ICD-10-CM | POA: Insufficient documentation

## 2011-05-17 DIAGNOSIS — M7989 Other specified soft tissue disorders: Secondary | ICD-10-CM | POA: Insufficient documentation

## 2011-05-17 DIAGNOSIS — Z8673 Personal history of transient ischemic attack (TIA), and cerebral infarction without residual deficits: Secondary | ICD-10-CM | POA: Insufficient documentation

## 2011-05-17 DIAGNOSIS — X58XXXA Exposure to other specified factors, initial encounter: Secondary | ICD-10-CM | POA: Insufficient documentation

## 2011-05-17 DIAGNOSIS — Z7982 Long term (current) use of aspirin: Secondary | ICD-10-CM | POA: Insufficient documentation

## 2011-05-17 HISTORY — DX: Cerebral infarction, unspecified: I63.9

## 2011-05-17 MED ORDER — ACETAMINOPHEN 325 MG PO TABS
650.0000 mg | ORAL_TABLET | Freq: Once | ORAL | Status: AC
  Administered 2011-05-17: 650 mg via ORAL
  Filled 2011-05-17: qty 2

## 2011-05-17 NOTE — ED Provider Notes (Addendum)
History     CSN: [id number] Arrival date & time: 05/17/2011  2:03 PM   First MD Initiated Contact with Patient 05/17/11 1405      Chief Complaint  Patient presents with  . Skin Ulcer    (Consider location/radiation/quality/duration/timing/severity/associated sxs/prior treatment) HPI Complains of fluid draining from right shin onset 2 days ago with large blister. Lesion is painless. No other complaint. No treatment prior to coming here no fever no injury nothing makes symptoms better or worse. No other associated symptom. Past Medical History  Diagnosis Date  . 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 Joint replacement bilaterial knees  . Lumbar laminectomy Per patient  heriated disc in mid spine    No family history on file.  History  Substance Use Topics  . Smoking status: Not on file  . Smokeless tobacco: Never Used  . Alcohol Use: No    Social history no tobacco no alcohol no drugs Review of Systems  Constitutional: Negative.   HENT: Negative.   Respiratory: Negative.   Cardiovascular: Positive for leg swelling.       Chronic leg edema  Gastrointestinal: Negative.   Musculoskeletal: Negative.   Skin: Negative.        Blister left shin  Neurological: Negative.   Hematological: Negative.   Psychiatric/Behavioral: Negative.     Allergies  Review of patient's allergies indicates no known allergies.  Home Medications   Current Outpatient Rx  Name Route Sig Dispense Refill  . AMIODARONE HCL 200 MG PO TABS Oral Take 200 mg by mouth daily.      . ASPIRIN EC 81 MG PO TBEC Oral Take 81 mg by mouth daily.      Marland Kitchen VITAMIN D3 2000 UNITS PO TABS Oral Take by mouth.      . DICLOFENAC SODIUM 50 MG PO TBEC Oral Take 50 mg by mouth 2 (two) times daily.      Marland Kitchen DOXAZOSIN MESYLATE 2 MG PO TABS Oral Take 2 mg by mouth at bedtime.      . DUTASTERIDE 0.5 MG PO CAPS Oral Take 0.5 mg by mouth daily.      . FUROSEMIDE 20 MG PO TABS Oral Take 20  mg by mouth daily.      Marland Kitchen OMEPRAZOLE 20 MG PO CPDR Oral Take 20 mg by mouth daily.      Marland Kitchen POTASSIUM CHLORIDE 10 MEQ PO TBCR Oral Take 10 mEq by mouth daily.      . WARFARIN SODIUM 5 MG PO TABS Oral Take 5 mg by mouth daily.        BP 125/68  Pulse 81  Temp(Src) 98 F (36.7 C) (Oral)  Resp 20  SpO2 97%  Physical Exam  Nursing note and vitals reviewed. Constitutional: He appears well-developed and well-nourished.  HENT:  Head: Normocephalic and atraumatic.  Eyes: Conjunctivae are normal. Pupils are equal, round, and reactive to light.  Neck: Neck supple. No tracheal deviation present. No thyromegaly present.  Cardiovascular: Normal rate and regular rhythm.   No murmur heard. Pulmonary/Chest: Effort normal and breath sounds normal.  Abdominal: Soft. Bowel sounds are normal. He exhibits no distension. There is no tenderness.  Musculoskeletal: Normal range of motion. He exhibits no edema and no tenderness.  Neurological: He is alert. Coordination normal.  Skin: Skin is warm and dry. No rash noted.       Left lower extremity with a big-sized blister containing serous fluid which is draining  spontaneously and loose at distal shin. There is a 2 mm blister at the dorsal surface of the great toe also containing clear fluid. Toenails are thickened. DP pulse 2+  Psychiatric: He has a normal mood and affect.    ED Course  Procedures (including critical care time)  Labs Reviewed - No data to display No results found.   No diagnosis found.  Procedure: Blister on shin was incised and drained by me. The area prepped with an alcohol wipe. The skin was easily incised with sterile scissors and debrided. Clear fluid drained easily. The debrided area was covered with bacitracin ointment and a sterile dressing.  MDM  Plan. Wound check 2 days Lee dressing in place until then. Diagnosis blister . Incised and drained in the emergency department        Doug Sou, MD 05/17/11  1440  Doug Sou, MD 05/17/11 1441

## 2011-05-17 NOTE — ED Notes (Signed)
Patient came in from home via EMS because of a left leg blister that was getting worse and the patient did not know how to treat the blister at home. Patient and his wife do not drive and EMS was their only option for transport.

## 2011-05-17 NOTE — ED Notes (Signed)
Per EMS: Patient called because the water blister on his left shin has gotten worse and he needed help caring for the blister. Patient denies pain, is ambulatory. VS WNL. Patient request help opening the blister and would care to keep from getting infection.

## 2011-05-18 ENCOUNTER — Encounter (HOSPITAL_COMMUNITY): Payer: Self-pay | Admitting: *Deleted

## 2011-05-18 ENCOUNTER — Inpatient Hospital Stay (HOSPITAL_COMMUNITY)
Admission: EM | Admit: 2011-05-18 | Discharge: 2011-05-21 | DRG: 603 | Disposition: A | Discharge status: Home Health | Payer: Medicare Other | Attending: Internal Medicine | Admitting: Internal Medicine

## 2011-05-18 DIAGNOSIS — I509 Heart failure, unspecified: Secondary | ICD-10-CM | POA: Diagnosis present

## 2011-05-18 DIAGNOSIS — I5022 Chronic systolic (congestive) heart failure: Secondary | ICD-10-CM | POA: Diagnosis present

## 2011-05-18 DIAGNOSIS — N400 Enlarged prostate without lower urinary tract symptoms: Secondary | ICD-10-CM | POA: Diagnosis present

## 2011-05-18 DIAGNOSIS — Z7901 Long term (current) use of anticoagulants: Secondary | ICD-10-CM

## 2011-05-18 DIAGNOSIS — L989 Disorder of the skin and subcutaneous tissue, unspecified: Secondary | ICD-10-CM

## 2011-05-18 DIAGNOSIS — Z8673 Personal history of transient ischemic attack (TIA), and cerebral infarction without residual deficits: Secondary | ICD-10-CM

## 2011-05-18 DIAGNOSIS — I4891 Unspecified atrial fibrillation: Secondary | ICD-10-CM | POA: Diagnosis present

## 2011-05-18 DIAGNOSIS — D649 Anemia, unspecified: Secondary | ICD-10-CM | POA: Diagnosis present

## 2011-05-18 DIAGNOSIS — L02419 Cutaneous abscess of limb, unspecified: Principal | ICD-10-CM | POA: Diagnosis present

## 2011-05-18 DIAGNOSIS — L03116 Cellulitis of left lower limb: Secondary | ICD-10-CM

## 2011-05-18 DIAGNOSIS — K219 Gastro-esophageal reflux disease without esophagitis: Secondary | ICD-10-CM | POA: Diagnosis present

## 2011-05-18 DIAGNOSIS — L03119 Cellulitis of unspecified part of limb: Secondary | ICD-10-CM

## 2011-05-18 DIAGNOSIS — Z96659 Presence of unspecified artificial knee joint: Secondary | ICD-10-CM

## 2011-05-18 HISTORY — DX: Heart failure, unspecified: I50.9

## 2011-05-18 HISTORY — DX: Unspecified atrial fibrillation: I48.91

## 2011-05-18 LAB — DIFFERENTIAL
Basophils Relative: 0 % (ref 0–1)
Eosinophils Absolute: 0.1 10*3/uL (ref 0.0–0.7)
Lymphs Abs: 1.6 10*3/uL (ref 0.7–4.0)
Monocytes Absolute: 0.3 10*3/uL (ref 0.1–1.0)
Monocytes Relative: 7 % (ref 3–12)

## 2011-05-18 LAB — CBC
HCT: 33 % — ABNORMAL LOW (ref 39.0–52.0)
Hemoglobin: 10.4 g/dL — ABNORMAL LOW (ref 13.0–17.0)
MCH: 25.1 pg — ABNORMAL LOW (ref 26.0–34.0)
MCHC: 31.5 g/dL (ref 30.0–36.0)
MCV: 79.7 fL (ref 78.0–100.0)

## 2011-05-18 LAB — POCT I-STAT, CHEM 8
Calcium, Ion: 1.17 mmol/L (ref 1.12–1.32)
Creatinine, Ser: 1.2 mg/dL (ref 0.50–1.35)
Glucose, Bld: 102 mg/dL — ABNORMAL HIGH (ref 70–99)
HCT: 33 % — ABNORMAL LOW (ref 39.0–52.0)
Hemoglobin: 11.2 g/dL — ABNORMAL LOW (ref 13.0–17.0)

## 2011-05-18 NOTE — ED Notes (Signed)
He was here yesterday and he had a blister that just came up on his lt lower leg drained.  The area has continued to drain and is not stopping and the pt is concerned.

## 2011-05-18 NOTE — ED Provider Notes (Signed)
History     CSN: [id number] Arrival date & time: 05/18/2011  4:46 PM   First MD Initiated Contact with Patient 05/18/11 2230      Chief Complaint  Patient presents with  . Blister    (Consider location/radiation/quality/duration/timing/severity/associated sxs/prior treatment) HPI Comments: This patient was seen 2 days ago for lower left leg edema.  Dr. Rennis Chris ruptured the blister.  Patient states that it has been draining copious amounts of serosanguineous fluid since he is very concerned that this is now infected.  This patient has been seen at the wound care clinic previously.  Dr. Alanda Amass.  His cardiologist is aware of the lower leg edema and suggested that the patient  increase his Lasix which he has done her appears to be less swelling of the leg, but now has his large draining superficially denuded area on the lateral aspect of the left calf  The history is provided by the patient.    Past Medical History  Diagnosis Date  . 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 Joint replacement bilaterial knees  . Lumbar laminectomy Per patient  heriated disc in mid spine    History reviewed. No pertinent family history.  History  Substance Use Topics  . Smoking status: Not on file  . Smokeless tobacco: Never Used  . Alcohol Use: No      Review of Systems  Constitutional: Negative.   HENT: Negative.   Eyes: Negative.   Respiratory: Negative.   Cardiovascular: Positive for leg swelling. Negative for chest pain.  Genitourinary: Negative.   Musculoskeletal: Negative.   Neurological: Negative.   Hematological: Negative.   Psychiatric/Behavioral: Negative.     Allergies  Review of patient's allergies indicates no known allergies.  Home Medications   Current Outpatient Rx  Name Route Sig Dispense Refill  . AMIODARONE HCL 200 MG PO TABS Oral Take 200 mg by mouth daily.      . ASPIRIN EC 81 MG PO TBEC Oral Take 81 mg by mouth  daily.      Marland Kitchen VITAMIN D3 2000 UNITS PO TABS Oral Take by mouth.     . DICLOFENAC SODIUM 50 MG PO TBEC Oral Take 50 mg by mouth 2 (two) times daily.      Marland Kitchen DOXAZOSIN MESYLATE 2 MG PO TABS Oral Take 2 mg by mouth at bedtime.      . DUTASTERIDE 0.5 MG PO CAPS Oral Take 0.5 mg by mouth daily.      . FUROSEMIDE 20 MG PO TABS Oral Take 20 mg by mouth daily.      Marland Kitchen OMEPRAZOLE 20 MG PO CPDR Oral Take 20 mg by mouth daily.      Marland Kitchen POTASSIUM CHLORIDE 10 MEQ PO TBCR Oral Take 10 mEq by mouth daily.      . WARFARIN SODIUM 5 MG PO TABS Oral Take 5 mg by mouth daily.        BP 106/58  Pulse 82  Temp(Src) 97.9 F (36.6 C) (Oral)  Resp 19  SpO2 99%  Physical Exam  Constitutional: He is oriented to person, place, and time. He appears well-developed.  HENT:  Head: Atraumatic.  Eyes: EOM are normal.  Cardiovascular: Normal rate.   Pulmonary/Chest: Breath sounds normal.  Abdominal: Soft.  Neurological: He is oriented to person, place, and time.  Skin:          5cm oval open oozing area with distal foot swelling + odor  Psychiatric: He has a normal mood and affect.    ED Course  Procedures (including critical care time)   Labs Reviewed  CBC  DIFFERENTIAL  I-STAT, CHEM 8   No results found.   No diagnosis found.    MDM  Statis ulcer Skin lesion      Medical screening examination/treatment/procedure(s) were performed by non-physician practitioner and as supervising physician I was immediately available for consultation/collaboration.    Arman Filter, NP 05/18/11 4782  Sunnie Nielsen, MD 05/19/11 [phone]

## 2011-05-19 ENCOUNTER — Emergency Department (HOSPITAL_COMMUNITY): Payer: Medicare Other

## 2011-05-19 ENCOUNTER — Encounter (HOSPITAL_COMMUNITY): Payer: Self-pay | Admitting: Emergency Medicine

## 2011-05-19 ENCOUNTER — Inpatient Hospital Stay (HOSPITAL_COMMUNITY): Payer: Medicare Other

## 2011-05-19 ENCOUNTER — Encounter (HOSPITAL_COMMUNITY): Payer: Self-pay | Admitting: *Deleted

## 2011-05-19 DIAGNOSIS — I4891 Unspecified atrial fibrillation: Secondary | ICD-10-CM | POA: Diagnosis present

## 2011-05-19 DIAGNOSIS — K219 Gastro-esophageal reflux disease without esophagitis: Secondary | ICD-10-CM | POA: Diagnosis present

## 2011-05-19 DIAGNOSIS — N400 Enlarged prostate without lower urinary tract symptoms: Secondary | ICD-10-CM | POA: Diagnosis present

## 2011-05-19 DIAGNOSIS — L03116 Cellulitis of left lower limb: Secondary | ICD-10-CM | POA: Diagnosis present

## 2011-05-19 DIAGNOSIS — I5022 Chronic systolic (congestive) heart failure: Secondary | ICD-10-CM | POA: Diagnosis present

## 2011-05-19 LAB — BASIC METABOLIC PANEL
CO2: 25 mEq/L (ref 19–32)
Calcium: 8.5 mg/dL (ref 8.4–10.5)
GFR calc non Af Amer: 51 mL/min — ABNORMAL LOW (ref >90)
Glucose, Bld: 105 mg/dL — ABNORMAL HIGH (ref 70–99)
Potassium: 3.9 mEq/L (ref 3.5–5.1)
Sodium: 138 mEq/L (ref 135–145)

## 2011-05-19 LAB — PROTIME-INR: INR: 2.2 — ABNORMAL HIGH (ref 0.00–1.49)

## 2011-05-19 MED ORDER — POTASSIUM CHLORIDE CRYS ER 10 MEQ PO TBCR
10.0000 meq | EXTENDED_RELEASE_TABLET | Freq: Every day | ORAL | Status: DC
  Administered 2011-05-19 – 2011-05-21 (×3): 10 meq via ORAL
  Filled 2011-05-19 (×3): qty 1

## 2011-05-19 MED ORDER — SODIUM CHLORIDE 0.9 % IJ SOLN: 3.0000 mL | INTRAMUSCULAR | Status: DC | PRN

## 2011-05-19 MED ORDER — LEVOFLOXACIN IN D5W 250 MG/50ML IV SOLN
250.0000 mg | INTRAVENOUS | Status: DC
  Administered 2011-05-20: 250 mg via INTRAVENOUS
  Filled 2011-05-19 (×2): qty 50

## 2011-05-19 MED ORDER — VANCOMYCIN HCL IN DEXTROSE 1-5 GM/200ML-% IV SOLN
1000.0000 mg | INTRAVENOUS | Status: DC
  Administered 2011-05-20: 1000 mg via INTRAVENOUS
  Filled 2011-05-19: qty 200

## 2011-05-19 MED ORDER — DICLOFENAC SODIUM 50 MG PO TBEC
50.0000 mg | DELAYED_RELEASE_TABLET | Freq: Two times a day (BID) | ORAL | Status: DC
  Administered 2011-05-19 – 2011-05-21 (×5): 50 mg via ORAL
  Filled 2011-05-19 (×6): qty 1

## 2011-05-19 MED ORDER — VANCOMYCIN HCL IN DEXTROSE 1-5 GM/200ML-% IV SOLN
1000.0000 mg | Freq: Once | INTRAVENOUS | Status: AC
  Administered 2011-05-19: 1000 mg via INTRAVENOUS
  Filled 2011-05-19: qty 200

## 2011-05-19 MED ORDER — AMIODARONE HCL 200 MG PO TABS
200.0000 mg | ORAL_TABLET | Freq: Every day | ORAL | Status: DC
  Administered 2011-05-19 – 2011-05-21 (×3): 200 mg via ORAL
  Filled 2011-05-19 (×3): qty 1

## 2011-05-19 MED ORDER — LEVOFLOXACIN IN D5W 500 MG/100ML IV SOLN
500.0000 mg | INTRAVENOUS | Status: AC
  Administered 2011-05-19: 500 mg via INTRAVENOUS
  Filled 2011-05-19: qty 100

## 2011-05-19 MED ORDER — SODIUM CHLORIDE 0.9 % IV SOLN
250.0000 mL | INTRAVENOUS | Status: DC
  Administered 2011-05-19: 250 mL via INTRAVENOUS

## 2011-05-19 MED ORDER — FUROSEMIDE 20 MG PO TABS
20.0000 mg | ORAL_TABLET | Freq: Every day | ORAL | Status: DC
  Administered 2011-05-20 – 2011-05-21 (×2): 20 mg via ORAL
  Filled 2011-05-19 (×3): qty 1

## 2011-05-19 MED ORDER — WARFARIN SODIUM 5 MG PO TABS: 5.0000 mg | ORAL_TABLET | Freq: Every day | ORAL | Status: DC

## 2011-05-19 MED ORDER — DOXAZOSIN MESYLATE 2 MG PO TABS
2.0000 mg | ORAL_TABLET | Freq: Every day | ORAL | Status: DC
  Administered 2011-05-20 (×2): 2 mg via ORAL
  Filled 2011-05-19 (×3): qty 1

## 2011-05-19 MED ORDER — ASPIRIN EC 81 MG PO TBEC
81.0000 mg | DELAYED_RELEASE_TABLET | Freq: Every day | ORAL | Status: DC
  Administered 2011-05-19 – 2011-05-21 (×3): 81 mg via ORAL
  Filled 2011-05-19 (×3): qty 1

## 2011-05-19 MED ORDER — WARFARIN SODIUM 5 MG PO TABS
5.0000 mg | ORAL_TABLET | Freq: Once | ORAL | Status: AC
  Administered 2011-05-20: 5 mg via ORAL
  Filled 2011-05-19: qty 1

## 2011-05-19 MED ORDER — DUTASTERIDE 0.5 MG PO CAPS
0.5000 mg | ORAL_CAPSULE | Freq: Every day | ORAL | Status: DC
  Administered 2011-05-19: 0.5 mg via ORAL
  Filled 2011-05-19 (×3): qty 1

## 2011-05-19 MED ORDER — PANTOPRAZOLE SODIUM 40 MG PO TBEC
40.0000 mg | DELAYED_RELEASE_TABLET | Freq: Every day | ORAL | Status: DC
  Administered 2011-05-19 – 2011-05-21 (×3): 40 mg via ORAL
  Filled 2011-05-19 (×2): qty 1

## 2011-05-19 MED ORDER — SODIUM CHLORIDE 0.9 % IJ SOLN: 3.0000 mL | Freq: Two times a day (BID) | INTRAMUSCULAR | Status: DC

## 2011-05-19 NOTE — ED Notes (Signed)
Completed on 11-22 

## 2011-05-19 NOTE — H&P (Signed)
PCP:    Dr Maryelizabeth Rowan  Chief Complaint:  Drainage of left leg wound  HPI: Eduardo Deleon came in with drainage of a left leg wound which started as a blister which was incised in the ED a few days ago. He felt that it was draining more, hence coming to the ED. He has had edema of the legs followed by his cardiologist but this was worse. He denies fever but has occasional SOB. Patient denies trauma. He was given vancomycin and was referred to the Hospitalist service for further management.  Review of Systems:  The patient denies anorexia, fever, weight loss,, vision loss, decreased hearing, hoarseness, chest pain, syncope, dyspnea on exertion, peripheral edema, balance deficits, hemoptysis, abdominal pain, melena, hematochezia, severe indigestion/heartburn, hematuria, incontinence, genital sores, muscle weakness, suspicious skin lesions, transient blindness, difficulty walking, depression, unusual weight change, abnormal bleeding, enlarged lymph nodes, angioedema, and breast masses.  Past Medical History: Past Medical History  Diagnosis Date  . 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 Atrial fibrillation   . Congestive heart failure    Past Surgical History  Procedure Date  . Joint replacement bilaterial knees  . Lumbar laminectomy Per patient  heriated disc in mid spine    Medications: Prior to Admission medications   Medication Sig Start Date End Date Taking? Authorizing Provider  amiodarone (PACERONE) 200 MG tablet Take 200 mg by mouth daily.     Yes Historical Provider, MD  aspirin EC 81 MG tablet Take 81 mg by mouth daily.     Yes Historical Provider, MD  diclofenac (VOLTAREN) 50 MG EC tablet Take 50 mg by mouth 2 (two) times daily.     Yes Historical Provider, MD  doxazosin (CARDURA) 2 MG tablet Take 2 mg by mouth at bedtime.     Yes Historical Provider, MD  dutasteride (AVODART) 0.5 MG capsule Take 0.5 mg by mouth daily.     Yes Historical Provider, MD  furosemide (LASIX) 20 MG  tablet Take 20 mg by mouth daily.     Yes Historical Provider, MD  omeprazole (PRILOSEC) 20 MG capsule Take 20 mg by mouth daily.     Yes Historical Provider, MD  potassium chloride (KLOR-CON) 10 MEQ CR tablet Take 10 mEq by mouth daily.     Yes Historical Provider, MD  warfarin (COUMADIN) 5 MG tablet Take 5 mg by mouth daily.     Yes Historical Provider, MD    Allergies:  No Known Allergies  Social History:  does not have a smoking history on file. He has never used smokeless tobacco. He reports that he does not drink alcohol or use illicit drugs.  Family History: History reviewed. No pertinent family history.  Physical Exam: Filed Vitals:   05/18/11 1657 05/19/11 0240 05/19/11 0316 05/19/11 1341  BP: 106/58 123/78 134/69 112/65  Pulse: 82 80 80 82  Temp: 97.9 F (36.6 C) 97.9 F (36.6 C) 97.6 F (36.4 C) 98.1 F (36.7 C)  TempSrc: Oral Oral Oral Oral  Resp: 19 14 18 18   SpO2: 99% 97% 99% 97%   General appearance: alert, cooperative and appears stated age   Labs on Admission:   Basename 05/18/11 2351  NA 143  K 3.8  CL 109  CO2 --  GLUCOSE 102*  BUN 24*  CREATININE 1.20  CALCIUM --  MG --  PHOS --   No results found for this basename: AST:2,ALT:2,ALKPHOS:2,BILITOT:2,PROT:2,ALBUMIN:2 in the last 72 hours No results found for this basename: LIPASE:2,AMYLASE:2 in the last  72 hours  Basename 05/18/11 2351 05/18/11 2317  WBC -- 4.5  NEUTROABS -- 2.6  HGB 11.2* 10.4*  HCT 33.0* 33.0*  MCV -- 79.7  PLT -- 164   No results found for this basename: CKTOTAL:3,CKMB:3,CKMBINDEX:3,TROPONINI:3 in the last 72 hours No results found for this basename: TSH,T4TOTAL,FREET3,T3FREE,THYROIDAB in the last 72 hours No results found for this basename: VITAMINB12:2,FOLATE:2,FERRITIN:2,TIBC:2,IRON:2,RETICCTPCT:2 in the last 72 hours  Radiological Exams on Admission: Dg Tibia/fibula Left  05/19/2011  *RADIOLOGY REPORT*  Clinical Data: Left lower leg pain and swelling; soft tissue  infection.  Assess for osteomyelitis.  LEFT TIBIA AND FIBULA - 2 VIEW  Comparison: None.  Findings: No osseous erosions are seen to suggest osteomyelitis. The tibia and fibula appear intact.  Mild bowing at the proximal fibula likely reflects remote injury.  The ankle mortise is incompletely assessed, but appears grossly unremarkable.  The patient is status post total knee arthroplasty; arthroplasty components appear grossly intact.  No knee joint effusion is identified.  IMPRESSION:  1.  No osseous erosions seen to suggest osteomyelitis; mild bowing of the proximal fibula likely reflects remote injury. 2.  Total knee arthroplasty appears grossly intact.  No knee joint effusion seen.  Original Report Authenticated By: Tonia Ghent, M.D.   Dg Foot Complete Left  05/19/2011  *RADIOLOGY REPORT*  Clinical Data: Left foot pain and swelling; left foot infection. Assess for osteomyelitis.  LEFT FOOT - COMPLETE 3+ VIEW  Comparison: None.  Findings: There is no evidence of fracture or dislocation.  No definite osseous erosions are seen to suggest osteomyelitis.  The joint spaces are preserved.  There is no evidence of talar subluxation; the subtalar joint is unremarkable in appearance.  A small posterior calcaneal spur is noted.  No significant soft tissue abnormalities are seen.  IMPRESSION: No osseous erosions seen to suggest osteomyelitis.  No evidence of fracture or dislocation.  If there is significant clinical concern for osteomyelitis, MRI could be considered for further evaluation.  Original Report Authenticated By: Tonia Ghent, M.D.    Assessment Pleasant 77 year old male who has multiple comorbids, including Afib on coumadin, who is coming in with likely left leg cellulitis. Plan 1. Left leg cellulitis- Will place patient on vanc/levaquin to cover for mrsa/pseudomonas. Consult wound care for wound dressings. Wound maybe colonized already hence will not culture. 2. AFIB on amiodarone- rate controlled.  Continue coumadin per pharmacy. [facility] 2-3. 3. History of CHF- ?EF. Seems compensated, continue home meds per cardiology. Will not need cardiac workup inpatient unless condition changes. 4. Gerd- on ppi, continue. 5. Disposition- eventually home. Condition fair.  Cannon Quinton 05/19/2011, 4:42 PM

## 2011-05-19 NOTE — Progress Notes (Signed)
ANTIBIOTIC CONSULT NOTE - INITIAL  Pharmacy Consult for Vancomycin and Coumadin Indication: Cellulitis, Hx Afib and CVA  No Known Allergies  Patient Measurements: Height: 5\' 8"  (172.7 cm) (per patient) Weight: 165 lb (74.844 kg) (per patient) IBW/kg (Calculated) : 68.4   Vital Signs: Temp: 98.1 F (36.7 C) (11/24 1341) Temp src: Oral (11/24 1341) BP: 112/65 mmHg (11/24 1341) Pulse Rate: 82  (11/24 1341) Intake/Output from previous day: 11/23 0701 - 11/24 0700 In: 62.4 [I.V.:62.4] Out: -  Intake/Output from this shift: Total I/O In: 480 [P.O.:480] Out: -   Labs:  Basename 05/18/11 2351 05/18/11 2317  WBC -- 4.5  HGB 11.2* 10.4*  PLT -- 164  LABCREA -- --  CREATININE 1.20 --   Estimated Creatinine Clearance: 44.3 ml/min (by C-G formula based on Cr of 1.2).  INR 2.2  Microbiology: No results found for this or any previous visit (from the past 720 hour(s)).  Medical History: Past Medical History  Diagnosis Date  . 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 Atrial fibrillation   . Congestive heart failure     Medications:  Anti-infectives     Start     Dose/Rate Route Frequency Ordered Stop   05/19/11 1800   levofloxacin (LEVAQUIN) IVPB 500 mg        500 mg 100 mL/hr over 60 Minutes Intravenous Every 24 hours 05/19/11 1606 05/26/11 1759   05/19/11 0145   vancomycin (VANCOCIN) IVPB 1000 mg/200 mL premix        1,000 mg 200 mL/hr over 60 Minutes Intravenous  Once 05/19/11 0145 05/19/11 1610         He reports taking Warfarin 5mg  daily except for 2.5mg  on Mondays and Fridays.  Assessment: LLE cellulitis:  To continue empiric antibiotic therapy with Vancomycin and Levaquin.  He has some renal insufficiency so will adjust accordingly.  Afib, hx CVA:  INR is therapeutic on his home Coumadin regimen but with reported diarrhea and antibiotic interactions will monitor for need for dose reduction.   Goal of Therapy:  Vancomycin trough level 10-15 mcg/ml INR  2-3  Plan:  Vancomycin 1gm IV q24h Adjust Levaquin to 250mg  IV q24h after today's dose of 500mg . Monitor renal function. Coumadin 5mg  today Daily PT/INR monitoring.  Estella Husk, Pharm.D., BCPS Clinical Pharmacist  Pager [phone] 05/19/2011, 6:47 PM

## 2011-05-19 NOTE — ED Notes (Signed)
Completed on 11-22

## 2011-05-20 DIAGNOSIS — D649 Anemia, unspecified: Secondary | ICD-10-CM | POA: Diagnosis present

## 2011-05-20 LAB — FERRITIN: Ferritin: 19 ng/mL — ABNORMAL LOW (ref 22–322)

## 2011-05-20 LAB — COMPREHENSIVE METABOLIC PANEL
AST: 20 U/L (ref 0–37)
Albumin: 2.7 g/dL — ABNORMAL LOW (ref 3.5–5.2)
Alkaline Phosphatase: 50 U/L (ref 39–117)
Chloride: 109 mEq/L (ref 96–112)
Potassium: 3.8 mEq/L (ref 3.5–5.1)
Sodium: 140 mEq/L (ref 135–145)
Total Bilirubin: 0.4 mg/dL (ref 0.3–1.2)
Total Protein: 5.6 g/dL — ABNORMAL LOW (ref 6.0–8.3)

## 2011-05-20 LAB — RETICULOCYTES
RBC.: 3.99 MIL/uL — ABNORMAL LOW (ref 4.22–5.81)
Retic Ct Pct: 1.1 % (ref 0.4–3.1)

## 2011-05-20 LAB — MAGNESIUM: Magnesium: 2.1 mg/dL (ref 1.5–2.5)

## 2011-05-20 LAB — CBC
MCHC: 31.3 g/dL (ref 30.0–36.0)
Platelets: 158 10*3/uL (ref 150–400)
RDW: 16.9 % — ABNORMAL HIGH (ref 11.5–15.5)
WBC: 4 10*3/uL (ref 4.0–10.5)

## 2011-05-20 LAB — IRON AND TIBC
Iron: 19 ug/dL — ABNORMAL LOW (ref 42–135)
TIBC: 345 ug/dL (ref 215–435)

## 2011-05-20 LAB — PROTIME-INR
INR: 2.45 — ABNORMAL HIGH (ref 0.00–1.49)
Prothrombin Time: 27 seconds — ABNORMAL HIGH (ref 11.6–15.2)

## 2011-05-20 LAB — VITAMIN B12: Vitamin B-12: 556 pg/mL (ref 211–911)

## 2011-05-20 MED ORDER — DUTASTERIDE 0.5 MG PO CAPS
0.5000 mg | ORAL_CAPSULE | Freq: Every day | ORAL | Status: DC
  Administered 2011-05-20 – 2011-05-21 (×2): 0.5 mg via ORAL
  Filled 2011-05-20: qty 1

## 2011-05-20 MED ORDER — WARFARIN SODIUM 5 MG PO TABS
5.0000 mg | ORAL_TABLET | Freq: Once | ORAL | Status: AC
Start: 2011-05-20 — End: 2011-05-20
  Administered 2011-05-20: 5 mg via ORAL
  Filled 2011-05-20: qty 1

## 2011-05-20 NOTE — Progress Notes (Signed)
ANTICOAGULATION CONSULT NOTE - Follow Up Consult  Pharmacy Consult for Coumadin Indication: Hx of Afib and CVA  No Known Allergies  Patient Measurements: Height: 5\' 8"  (172.7 cm) (per patient) Weight: 165 lb (74.844 kg) (per patient) IBW/kg (Calculated) : 68.4   Vital Signs: Temp: 97.8 F (36.6 C) (11/25 0700) Temp src: Oral (11/25 0031) BP: 135/81 mmHg (11/25 0700) Pulse Rate: 79  (11/25 0700)  Labs:  Basename 05/20/11 0625 05/20/11 0500 05/19/11 1728 05/18/11 2351 05/18/11 2317  HGB 9.3* -- -- 11.2* --  HCT 29.7* -- -- 33.0* 33.0*  PLT 158 -- -- -- 164  APTT -- -- -- -- --  LABPROT -- 27.0* 24.8* -- --  INR -- 2.45* 2.20* -- --  HEPARINUNFRC -- -- -- -- --  CREATININE 1.14 -- 1.26 1.20 --  CKTOTAL -- -- -- -- --  CKMB -- -- -- -- --  TROPONINI -- -- -- -- --   Estimated Creatinine Clearance: 46.7 ml/min (by C-G formula based on Cr of 1.14).   Medications:  Scheduled:    . amiodarone  200 mg Oral Daily  . aspirin EC  81 mg Oral Daily  . diclofenac  50 mg Oral BID WC  . doxazosin  2 mg Oral QHS  . dutasteride  0.5 mg Oral Daily  . furosemide  20 mg Oral Daily  . levofloxacin (LEVAQUIN) IV  250 mg Intravenous Q24H  . levofloxacin (LEVAQUIN) IV  500 mg Intravenous Q24H  . pantoprazole  40 mg Oral Q1200  . potassium chloride  10 mEq Oral Daily  . sodium chloride  3 mL Intravenous Q12H  . vancomycin  1,000 mg Intravenous Q24H  . warfarin  5 mg Oral Once  . warfarin  5 mg Oral ONCE-1800  . DISCONTD: warfarin  5 mg Oral Daily   Infusions:    . sodium chloride 250 mL (05/19/11 1531)   Anti-infectives     Start     Dose/Rate Route Frequency Ordered Stop   05/20/11 1800   Levofloxacin (LEVAQUIN) IVPB 250 mg        250 mg 50 mL/hr over 60 Minutes Intravenous Every 24 hours 05/19/11 1846 05/26/11 1759   05/20/11 0100   vancomycin (VANCOCIN) IVPB 1000 mg/200 mL premix        1,000 mg 200 mL/hr over 60 Minutes Intravenous Every 24 hours 05/19/11 1844     05/19/11 1800   levofloxacin (LEVAQUIN) IVPB 500 mg        500 mg 100 mL/hr over 60 Minutes Intravenous Every 24 hours 05/19/11 1606 05/19/11 1939   05/19/11 0145   vancomycin (VANCOCIN) IVPB 1000 mg/200 mL premix        1,000 mg 200 mL/hr over 60 Minutes Intravenous  Once 05/19/11 0145 05/19/11 1610          Assessment: 77 yo M with Hx of Afib and CVA he reports taking coumadin 5mg  daily at home except for 2.5 mg on Mondays and Fridays. INR is therapeutic.   Goal of Therapy:  INR 2-3   Plan:  Coumadin 5mg  po today x1 F/U daily INR Cont vanc and monitor renal fx  Janace Litten, PharmD [phone] 05/20/2011,9:32 AM

## 2011-05-20 NOTE — Progress Notes (Signed)
OT attempted eval.  Upon entering the room, patient was sitting EOB and looked unsteady.  OT verbally cued patient to scoot back on the bed.   Patient informed OT that he had gotten up by himself to use his hand urinal and had gotten urine on the floor, but had just finished cleaning it up.  OT encouraged patient to call for nursing staff and educated about red call button.  Patient said he was just fine to get up on his own.  OT educated re: fall risk.  Patient asked OT to come back another time because "I'm eating dinner".  OT set bed alarm and informed RN Judeth Cornfield) that patient got OOB alone.    OT to attempt eval another time, most likely tomorrow.  05/20/2011 Manfred Laspina K. Beatrice-Mitchell, MS, OTR/L Occupational Therapist Acute Rehabilitation - [facility] Phone: [phone] Pager: [phone] [email or web address]

## 2011-05-20 NOTE — Progress Notes (Signed)
Physical Therapy Evaluation Patient Details Name: Eduardo Deleon MRN: [id number] DOB: [birth year] Today's Date: 05/20/2011  Problem List:  Patient Active Problem List  Diagnoses  . Cellulitis of left leg  . A-fib  . GERD (gastroesophageal reflux disease)  . BPH (benign prostatic hyperplasia)  . CHF (congestive heart failure)    Past Medical History:  Past Medical History  Diagnosis Date  . 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 Atrial fibrillation   . Congestive heart failure    Past Surgical History:  Past Surgical History  Procedure Date  . Joint replacement bilaterial knees  . Lumbar laminectomy Per patient  heriated disc in mid spine    PT Assessment/Plan/Recommendation PT Assessment Clinical Impression Statement: pt mobility not affected by wound on L LE, however has decreased balance, decreased safety awareness and awareness of deficits.   PT Recommendation/Assessment: Patient will need skilled PT in the acute care venue PT Problem List: Decreased activity tolerance;Decreased balance;Decreased mobility;Decreased knowledge of use of DME;Decreased safety awareness Barriers to Discharge: Decreased caregiver support PT Therapy Diagnosis : Difficulty walking PT Plan PT Frequency: Min 3X/week PT Treatment/Interventions: DME instruction;Gait training;Stair training;Functional mobility training;Therapeutic exercise;Balance training;Patient/family education PT Recommendation Recommendations for Other Services: OT consult Follow Up Recommendations: Home health PT;Skilled nursing facility (Would benefit from SNF, however ?if pt would agree.  ) Equipment Recommended: 3 in 1 bedside comode;Tub/shower bench PT Goals  Acute Rehab PT Goals PT Goal Formulation: With patient Time For Goal Achievement: 2 weeks Pt will go Supine/Side to Sit: Independently;with HOB 0 degrees PT Goal: Supine/Side to Sit - Progress: Progressing toward goal Pt will Transfer Sit to Stand/Stand to Sit: with  modified independence;with upper extremity assist PT Transfer Goal: Sit to Stand/Stand to Sit - Progress: Progressing toward goal Pt will Ambulate: >150 feet;with modified independence;with rolling walker PT Goal: Ambulate - Progress: Progressing toward goal Pt will Go Up / Down Stairs: 3-5 stairs;with supervision;with least restrictive assistive device PT Goal: Up/Down Stairs - Progress: Not met  PT Evaluation Precautions/Restrictions  Precautions Precautions: Fall Restrictions Weight Bearing Restrictions: No Prior Functioning  Home Living Lives With: Spouse Receives Help From: [facility]" for house cleaning/chores) Type of Home: House Home Layout: One level Home Access: Stairs to enter Entrance Stairs-Rails: Can reach both (at front steps.  No rail at back steps) Entrance Stairs-Number of Steps: 5 at front, 2 at back Bathroom Shower/Tub: Tub/shower unit (pt sponge bathes at sink) Bathroom Toilet: Handicapped height Home Adaptive Equipment: Walker - rolling (3-wheeled RW) Prior Function Level of Independence: Needs assistance with homemaking;Requires assistive device for independence Able to Take Stairs?: Yes Driving: Yes Vocation: Retired Financial risk analyst Arousal/Alertness: Awake/alert Overall Cognitive Status: Appears within functional limits for tasks assessed Sensation/Coordination   Extremity Assessment RLE Assessment RLE Assessment: Within Functional Limits LLE Assessment LLE Assessment: Within Functional Limits Mobility (including Balance) Bed Mobility Bed Mobility: Yes Supine to Sit: 6: Modified independent (Device/Increase time);With rails Sitting - Scoot to Edge of Bed: 7: Independent Transfers Transfers: Yes Sit to Stand: 4: Min assist;With upper extremity assist;From bed Sit to Stand Details (indicate cue type and reason): cues for use of UEs Stand to Sit: 4: Min assist;With upper extremity assist;To chair/3-in-1 Stand to Sit Details: cues to  control descent and get closer to recliner prior to sitting Ambulation/Gait Ambulation/Gait: Yes Ambulation/Gait Assistance: 4: Min assist Ambulation/Gait Assistance Details (indicate cue type and reason): cues for postioning in RW, safety with RW Ambulation Distance (Feet): 150 Feet Assistive device: Rolling walker Gait Pattern: Shuffle;Trunk  flexed (Very flexed posture)    Exercise    End of Session PT - End of Session Equipment Utilized During Treatment: Gait belt Activity Tolerance: Patient tolerated treatment well Patient left: in chair;with call bell in reach Nurse Communication: Mobility status for transfers (Need for use of RW) General Behavior During Session: [facility] for tasks performed Cognition: [facility] for tasks performed  Sunny Schlein, [place] [phone] 05/20/2011, 12:35 PM

## 2011-05-20 NOTE — Consult Note (Signed)
WOC consult Note Reason for Consult: left lower extremity Wound type: ulceration due to venous insufficiency, initially presented as a fluid-filled blister (unroofed in ED). Patient reports long history of edema being managed/followed by his cardiologist. Measurement:7.5 x 8cm x .2cm  Wound bed: 90% clean, pink and granulating, 10% necrotic slough at 6 o'clock. Drainage (amount, consistency, odor) moderate amount of serous drainage. Periwound: discolored by hemosiderin staining, small open area at 11 o'clock measuring .4cm round that bleeds freely Dressing procedure/placement/frequency: I have implemented a silver hydrofiber dressing (Aquacel Ag+) with a change today and tomorrow, then 3xWeek. (m-w-f).  Elevation of the extremity Patient has been seen twice in the past (albeit not recently) for venous ulcerations, but never one to this extent (size).  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  Treatment is likely to be compression wraps, as when he is home and legs are in the dependent position, edema is worse, which predisposes him to ulceration.  Both options are acceptable and will result in resolution of his ulcer. I will not follow.  Please re-consult if needed. Thanks, Ladona Mow, MSN, RN, [facility], CWOCN [phone])

## 2011-05-20 NOTE — Progress Notes (Signed)
WOC/PT appreciated. SUBJECTIVE Feels ok, no complaints.   1. Cellulitis and abscess of foot   2. Skin lesion of left leg     Past Medical History  Diagnosis Date  . 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 Atrial fibrillation   . Congestive heart failure    Current Facility-Administered Medications  Medication Dose Route Frequency Provider Last Rate Last Dose  . amiodarone (PACERONE) tablet 200 mg  200 mg Oral Daily Adriane Gabbert   200 mg at 05/20/11 1059  . aspirin EC tablet 81 mg  81 mg Oral Daily Mickie Badders   81 mg at 05/20/11 1059  . diclofenac (VOLTAREN) EC tablet 50 mg  50 mg Oral BID WC Kayann Maj   50 mg at 05/20/11 0812  . doxazosin (CARDURA) tablet 2 mg  2 mg Oral QHS Ankush Gintz   2 mg at 05/20/11 0017  . dutasteride (AVODART) capsule 0.5 mg  0.5 mg Oral Daily Hessie Diener Markle, PHARMD      . furosemide (LASIX) tablet 20 mg  20 mg Oral Daily Naba Sneed   20 mg at 05/20/11 1059  . Levofloxacin (LEVAQUIN) IVPB 250 mg  250 mg Intravenous Q24H Madolyn Frieze, PHARMD      . levofloxacin (LEVAQUIN) IVPB 500 mg  500 mg Intravenous Q24H Madolyn Frieze, PHARMD   500 mg at 05/19/11 1839  . pantoprazole (PROTONIX) EC tablet 40 mg  40 mg Oral Q1200 Kimesha Claxton   40 mg at 05/20/11 1104  . potassium chloride (K-DUR,KLOR-CON) CR tablet 10 mEq  10 mEq Oral Daily Anilah Huck   10 mEq at 05/20/11 1059  . vancomycin (VANCOCIN) IVPB 1000 mg/200 mL premix  1,000 mg Intravenous Q24H Madolyn Frieze, PHARMD   1,000 mg at 05/20/11 0017  . warfarin (COUMADIN) tablet 5 mg  5 mg Oral Once Madolyn Frieze, PHARMD   5 mg at 05/20/11 0017  . warfarin (COUMADIN) tablet 5 mg  5 mg Oral ONCE-1800 Christopher T Elder, PHARMD      . DISCONTD: 0.9 %  sodium chloride infusion  250 mL Intravenous Continuous Arman Filter, NP 1 mL/hr at 05/19/11 1531 250 mL at 05/19/11 1531  . DISCONTD: dutasteride (AVODART) capsule 0.5 mg  0.5 mg Oral Daily Marcio Hoque   0.5 mg at  05/19/11 1841  . DISCONTD: sodium chloride 0.9 % injection 3 mL  3 mL Intravenous Q12H Arman Filter, NP      . DISCONTD: sodium chloride 0.9 % injection 3 mL  3 mL Intravenous PRN Arman Filter, NP      . DISCONTD: warfarin (COUMADIN) tablet 5 mg  5 mg Oral Daily Ilamae Geng       No Known Allergies Active Problems:  Cellulitis of left leg  A-fib  GERD (gastroesophageal reflux disease)  BPH (benign prostatic hyperplasia)  CHF (congestive heart failure)   Vital signs in last 24 hours: Temp:  [97.2 F (36.2 C)-97.8 F (36.6 C)] 97.8 F (36.6 C) (11/25 0700) Pulse Rate:  [79-82] 79  (11/25 0700) Resp:  [18] 18  (11/25 0700) BP: (114-135)/(60-81) 135/81 mmHg (11/25 0700) SpO2:  [99 %-100 %] 99 % (11/25 0700) Weight:  [74.844 kg (165 lb)] 165 lb (74.844 kg) (11/24 1700) Weight change:  Last BM Date: 05/18/11  Intake/Output from previous day: 11/24 0701 - 11/25 0700 In: 720 [P.O.:720] Out: -  Intake/Output this shift: Total I/O In: -  Out: 300 [Urine:300]  Lab Results:  [facility] 05/20/11 1610  05/18/11 2351 05/18/11 2317  WBC 4.0 -- 4.5  HGB 9.3* 11.2* --  HCT 29.7* 33.0* --  PLT 158 -- 164   BMET  Basename 05/20/11 0625 05/19/11 1728  NA 140 138  K 3.8 3.9  CL 109 104  CO2 24 25  GLUCOSE 90 105*  BUN 20 21  CREATININE 1.14 1.26  CALCIUM 8.0* 8.5    Studies/Results: Dg Tibia/fibula Left  05/19/2011  *RADIOLOGY REPORT*  Clinical Data: Left lower leg pain and swelling; soft tissue infection.  Assess for osteomyelitis.  LEFT TIBIA AND FIBULA - 2 VIEW  Comparison: None.  Findings: No osseous erosions are seen to suggest osteomyelitis. The tibia and fibula appear intact.  Mild bowing at the proximal fibula likely reflects remote injury.  The ankle mortise is incompletely assessed, but appears grossly unremarkable.  The patient is status post total knee arthroplasty; arthroplasty components appear grossly intact.  No knee joint effusion is identified.  IMPRESSION:   1.  No osseous erosions seen to suggest osteomyelitis; mild bowing of the proximal fibula likely reflects remote injury. 2.  Total knee arthroplasty appears grossly intact.  No knee joint effusion seen.  Original Report Authenticated By: Tonia Ghent, M.D.   Dg Chest Port 1 View  05/19/2011  *RADIOLOGY REPORT*  Clinical Data: Shortness of breath; history of congestive heart failure.  PORTABLE CHEST - 1 VIEW  Comparison: Chest radiograph performed 03/01/2010, and prior CT of the cervical spine performed 09/26/2009  Findings: The lungs are mildly hypoexpanded.  Vascular crowding and vascular congestion are noted, without significant edema.  Mild bilateral atelectasis is noted.  There is no evidence of pleural effusion or pneumothorax.  Density at the right paratracheal region reflects tortuous vasculature, as characterized on prior CT.  The cardiomediastinal silhouette is enlarged; there is mild tortuosity of the thoracic aorta.  No acute osseous abnormalities are seen.  IMPRESSION:  1.  Vascular congestion and cardiomegaly noted, without significant pulmonary edema. 2.  Mild bilateral atelectasis noted; lungs mildly hypoexpanded.  Original Report Authenticated By: Tonia Ghent, M.D.   Dg Foot Complete Left  05/19/2011  *RADIOLOGY REPORT*  Clinical Data: Left foot pain and swelling; left foot infection. Assess for osteomyelitis.  LEFT FOOT - COMPLETE 3+ VIEW  Comparison: None.  Findings: There is no evidence of fracture or dislocation.  No definite osseous erosions are seen to suggest osteomyelitis.  The joint spaces are preserved.  There is no evidence of talar subluxation; the subtalar joint is unremarkable in appearance.  A small posterior calcaneal spur is noted.  No significant soft tissue abnormalities are seen.  IMPRESSION: No osseous erosions seen to suggest osteomyelitis.  No evidence of fracture or dislocation.  If there is significant clinical concern for osteomyelitis, MRI could be considered for  further evaluation.  Original Report Authenticated By: Tonia Ghent, M.D.    Medications: I have reviewed the patient's current medications.   Physical exam GENERAL- alert and well HEAD- normal atraumatic, no neck masses, normal thyroid, no jvd RESPIRATORY- chest clear, no wheezing, crepitations, rhonchi, normal symmetric air entry CVS- regular rate and rhythm, S1, S2 normal, no murmur, click, rub or gallop ABDOMEN- abdomen is soft without significant tenderness, masses, organomegaly or guarding NEURO- Grossly normal EXTREMITIES- no drainage left leg ulcer.  Plan 1. Left leg cellulitis- seems healing. Explore possibility of pvd- check ABI. Wound care appreciated. For home RN at d/c. Will deescalate abx to Augmentin/levaquin for now. 2. AFIB on amiodarone- rate controlled. Pharmacy appreciated.  INR therapeutic,  expect fluctuation with quinolone.  3. History of CHF- ?EF. Seems compensated, continue home meds per cardiology. Will not need cardiac workup inpatient unless condition changes.  4. Gerd- on ppi, continue.  5. Normocytic anemia- anemia panel. 6. Disposition- eventually home with HHS.  Condition fair.     Eduardo Deleon 05/20/2011 1:47 PM Pager: [phone].

## 2011-05-21 DIAGNOSIS — L97909 Non-pressure chronic ulcer of unspecified part of unspecified lower leg with unspecified severity: Secondary | ICD-10-CM

## 2011-05-21 MED ORDER — LEVOFLOXACIN 250 MG PO TABS
250.0000 mg | ORAL_TABLET | Freq: Every day | ORAL | Status: DC
  Administered 2011-05-21: 250 mg via ORAL
  Filled 2011-05-21 (×2): qty 1

## 2011-05-21 MED ORDER — FERROUS SULFATE 325 (65 FE) MG PO TABS: 325.0000 mg | ORAL_TABLET | Freq: Two times a day (BID) | ORAL | Status: DC

## 2011-05-21 MED ORDER — WARFARIN SODIUM 5 MG PO TABS
5.0000 mg | ORAL_TABLET | Freq: Once | ORAL | Status: AC
  Administered 2011-05-21: 5 mg via ORAL
  Filled 2011-05-21: qty 1

## 2011-05-21 MED ORDER — FERROUS SULFATE 325 (65 FE) MG PO TABS
325.0000 mg | ORAL_TABLET | Freq: Two times a day (BID) | ORAL | Status: DC
  Administered 2011-05-21: 325 mg via ORAL
  Filled 2011-05-21 (×2): qty 1

## 2011-05-21 MED ORDER — LEVOFLOXACIN 250 MG PO TABS: 250.0000 mg | ORAL_TABLET | Freq: Every day | ORAL | Status: AC

## 2011-05-21 MED ORDER — OMEPRAZOLE 20 MG PO CPDR: 20.0000 mg | DELAYED_RELEASE_CAPSULE | Freq: Two times a day (BID) | ORAL | Status: DC

## 2011-05-21 MED ORDER — VITAMIN D3 50 MCG (2000 UT) PO TABS: 1.0000 | ORAL_TABLET | ORAL | Status: DC

## 2011-05-21 NOTE — Progress Notes (Signed)
Utilization review completed.  

## 2011-05-21 NOTE — Discharge Summary (Signed)
DISCHARGE SUMMARY  Eduardo Deleon  MR#: [id number]  DOB:1925-01-05  Date of Admission: 05/18/2011 Date of Discharge: 05/21/2011  Attending Physician:Seniah Lawrence  Patient's PCP: Dr Maryelizabeth Rowan.  Consults: Wound Care.  Discharge Diagnoses: Present on Admission:  .Cellulitis of left leg .A-fib .GERD (gastroesophageal reflux disease) .BPH (benign prostatic hyperplasia) .CHF (congestive heart failure) .Anemia- IDA.    Current Discharge Medication List    START taking these medications   Details  ferrous sulfate 325 (65 FE) MG tablet Take 1 tablet (325 mg total) by mouth 2 (two) times daily with a meal. Qty: 30 tablet, Refills: 0    levofloxacin (LEVAQUIN) 250 MG tablet Take 1 tablet (250 mg total) by mouth daily at 6 PM. Qty: 7 tablet, Refills: 0      CONTINUE these medications which have CHANGED   Details  Cholecalciferol (VITAMIN D3) 2000 UNITS TABS Take 1 tablet by mouth 1 day or 1 dose. Qty: 30 tablet, Refills: 0    omeprazole (PRILOSEC) 20 MG capsule Take 1 capsule (20 mg total) by mouth 2 (two) times daily. Qty: 30 capsule, Refills: 0      CONTINUE these medications which have NOT CHANGED   Details  amiodarone (PACERONE) 200 MG tablet Take 200 mg by mouth daily.      aspirin EC 81 MG tablet Take 81 mg by mouth daily.      diclofenac (VOLTAREN) 50 MG EC tablet Take 50 mg by mouth 2 (two) times daily.      doxazosin (CARDURA) 2 MG tablet Take 2 mg by mouth at bedtime.      dutasteride (AVODART) 0.5 MG capsule Take 0.5 mg by mouth daily.      furosemide (LASIX) 20 MG tablet Take 20 mg by mouth daily.      potassium chloride (KLOR-CON) 10 MEQ CR tablet Take 10 mEq by mouth daily.      warfarin (COUMADIN) 5 MG tablet Take 5 mg by mouth daily.           Hospital Course: Mr Sandra Cockayne was admitted with a draining ulcer of the left leg which started as a blister that opened and started oozing. There was concern for cellulitis hence patient started  on vanc/levaquin, later kept on levaquin alone to complete a week. He was seen by WOC who directed the wound dressings, and he will have a nurse help with dressings at home. In this admission, patient was noted to have IDA ? Nutritional vs gi source. I started ferrous sulfate and deferred decision for further w/u to DR Duanne Guess as patient is on coumadin. I am not sure what his baseline is and it seems like he has good gi follow up outpatient. He is discharged in stable condition.   Day of Discharge BP 118/71  Pulse 80  Temp(Src) 98.4 F (36.9 C) (Oral)  Resp 20  Ht 5\' 8"  (1.727 m)  Wt 74.844 kg (165 lb)  BMI 25.09 kg/m2  SpO2 98%  Physical Exam: No drainage from left leg ulcer.  Results for orders placed during the hospital encounter of 05/18/11 (from the past 24 hour(s))  PROTIME-INR     Status: Abnormal   Collection Time   05/21/11  8:00 AM      Component Value Range   Prothrombin Time 24.0 (*) 11.6 - 15.2 (seconds)   INR 2.11 (*) 0.00 - 1.49     Disposition: Home with visiting RN.   Follow-up Appts: Discharge Orders    Future Orders Please Complete By Expires  Diet - low sodium heart healthy      Increase activity slowly      Change dressing (specify)      Comments:   Dressing change: once daily.      Follow-up with Dr. Duanne Guess,  in 1 week.   Tests Needing Follow-up: May need EGD/Colonoscopy for IDA. Time spent for d/c prep 25 minutes. Signed: Madeleine Fenn 05/21/2011, 2:55 PM

## 2011-05-21 NOTE — Progress Notes (Signed)
   CARE MANAGEMENT NOTE 05/21/2011  Patient:  Eduardo Deleon, Eduardo Deleon   Account Number:  [id number]  Date Initiated:  05/21/2011  Documentation initiated by:  Letha Cape  Subjective/Objective Assessment:   dx cellulitis of left leg  admit- lives with wife.     Action/Plan:   pt eval- rec hhpt/ot   Anticipated DC Date:  05/21/2011   Anticipated DC Plan:  HOME W HOME HEALTH SERVICES      DC Planning Services  CM consult      [facility] Choice  HOME HEALTH   Choice offered to / List presented to:  [name]        HH arranged  HH-1 RN      [facility] agency  [facility]   Status of service:  Completed, signed off Medicare Important Message given?   (If response is "NO", the following Medicare IM given date fields will be blank) Date Medicare IM given:   Date Additional Medicare IM given:    Discharge Disposition:  HOME W HOME HEALTH SERVICES  Per UR Regulation:  Reviewed for med. necessity/level of care/duration of stay  Comments:  05/21/11 15:21 Letha Cape RN, BSN [phone] Patient for discharge today with [facility] for dressing changes. Patient chose Eduardo Deleon from agency list, referral sent to [facility] via [facility] and Debbie notified.  Soc will begin 24-48 hrs post discharge.

## 2011-05-21 NOTE — Progress Notes (Signed)
*  PRELIMINARY RESULTS*  ABI has been performed. Right ABI = 1.32 and the Left ABI = 1.21.  Eduardo Deleon 05/21/2011, 10:11 AM

## 2011-05-21 NOTE — Progress Notes (Signed)
Physical Therapy Treatment Patient Details Name: Eduardo Deleon MRN: [id number] DOB: June 28, 1924 Today's Date: 05/21/2011  PT Assessment/Plan  PT - Assessment/Plan PT Plan: Discharge plan remains appropriate PT Frequency: Min 3X/week Follow Up Recommendations: Home health PT Equipment Recommended: 3 in 1 bedside comode;Tub/shower seat PT Goals  Acute Rehab PT Goals PT Goal: Supine/Side to Sit - Progress: Progressing toward goal PT Transfer Goal: Sit to Stand/Stand to Sit - Progress: Progressing toward goal PT Goal: Ambulate - Progress: Progressing toward goal PT Goal: Up/Down Stairs - Progress: Progressing toward goal  PT Treatment Precautions/Restrictions  Precautions Precautions: Fall Restrictions Weight Bearing Restrictions: No Mobility (including Balance) Bed Mobility Supine to Sit: 6: Modified independent (Device/Increase time) Sitting - Scoot to Edge of Bed: 6: Modified independent (Device/Increase time) Sit to Supine - Left: 6: Modified independent (Device/Increase time) Transfers Transfers: Yes Sit to Stand: Other (comment);From bed;From chair/3-in-1;Without upper extremity assist (MinGuard a) Sit to Stand Details (indicate cue type and reason): Practice serial standing/sitting x5 without upper extremities for increased strengthening and challenge.  Stand to Sit: To chair/3-in-1;To bed;Without upper extremity assist (MinGuard A) Stand to Sit Details: See above standing comments. Pt states he has difficulty standing from his recliner at home. Educated to "Build Up" chair with blankets to ease stand and heighten chair at home.  Ambulation/Gait Ambulation/Gait: Yes Ambulation/Gait Assistance: Other (comment) (MinGuard A) Ambulation/Gait Assistance Details (indicate cue type and reason): Cues for safe positioning of RW and to avoided obstacles.  Ambulation Distance (Feet): 320 Feet Assistive device: Rolling walker Gait Pattern: Decreased stride length;Shuffle;Trunk  flexed Gait velocity: 1.29 feet per second. Score less than 1.8 indicates pt at risk for falls and a score of <1.31 puts pt at category to be a household ambulator.  Stairs: Yes Stairs Assistance: Other (comment) (minGuard A) Stair Management Technique: Two rails Number of Stairs: 6  Wheelchair Mobility Wheelchair Mobility: No    Exercise    End of Session PT - End of Session Equipment Utilized During Treatment: Gait belt Activity Tolerance: Patient tolerated treatment well Patient left: in bed;with call bell in reach Nurse Communication: Mobility status for transfers;Mobility status for ambulation General Behavior During Session: [facility] for tasks performed Cognition: [facility] for tasks performed  Arretta Toenjes, Adline Potter 05/21/2011, 12:09 PM 05/21/2011 Fredrich Birks PTA [phone] pager [phone] office

## 2011-05-21 NOTE — Progress Notes (Signed)
ANTICOAGULATION CONSULT NOTE - Follow Up Consult  Pharmacy Consult for Coumadin Indication: atrial fibrillation   Vital Signs: Temp: 98.2 F (36.8 C) (11/26 0543) Temp src: Oral (11/26 0543) BP: 121/82 mmHg (11/26 0543) Pulse Rate: 81  (11/26 0543)  Labs:  Basename 05/21/11 0800 05/20/11 0625 05/20/11 0500 05/19/11 1728 05/18/11 2351 05/18/11 2317  HGB -- 9.3* -- -- 11.2* --  HCT -- 29.7* -- -- 33.0* 33.0*  PLT -- 158 -- -- -- 164  APTT -- -- -- -- -- --  LABPROT 24.0* -- 27.0* 24.8* -- --  INR 2.11* -- 2.45* 2.20* -- --  HEPARINUNFRC -- -- -- -- -- --  CREATININE -- 1.14 -- 1.26 1.20 --  CKTOTAL -- -- -- -- -- --  CKMB -- -- -- -- -- --  TROPONINI -- -- -- -- -- --    Assessment: 77 yo M on coumadin for Hx of Afib and CVA ( PTA dose: 5mg  daily at home except for 2.5 mg on Mondays. INR is low-end therapeutic (2.11) and decreased from yesterday.    Goal of Therapy:  INR 2-3   Plan:  Coumadin 5mg  po today x1  F/U daily INR   Riki Rusk 05/21/2011,10:49 AM

## 2011-05-21 NOTE — Plan of Care (Signed)
Problem: Phase III Progression Outcomes Goal: Activity at appropriate level-compared to baseline (UP IN CHAIR FOR HEMODIALYSIS)  Outcome: Progressing Pt with balance deficits and declines OOB<>chair however was willing to stand at EOB. Pt with lunch present and OT to follow up 05/22/11 to help progress pt further with balance testing.

## 2011-05-21 NOTE — Progress Notes (Signed)
Occupational Therapy Evaluation Patient Details Name: Eduardo Deleon MRN: [id number] DOB: Jul 20, 1924 Today's Date: 05/21/2011  Problem List:  Patient Active Problem List  Diagnoses  . Cellulitis of left leg  . A-fib  . GERD (gastroesophageal reflux disease)  . BPH (benign prostatic hyperplasia)  . CHF (congestive heart failure)  . Anemia    Past Medical History:  Past Medical History  Diagnosis Date  . 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 Atrial fibrillation   . Congestive heart failure    Past Surgical History:  Past Surgical History  Procedure Date  . Joint replacement bilaterial knees  . Lumbar laminectomy Per patient  heriated disc in mid spine    OT Assessment/Plan/Recommendation OT Assessment Clinical Impression Statement: decreased balance, decreased awareness of deficits, decreased activity tolerance OT Recommendation/Assessment: Patient will need skilled OT in the acute care venue Barriers to Discharge: Decreased caregiver support Barriers to Discharge Comments: pt declining HHOT or SNF level assistance. Pt wishes to d/c home I without therapy OT Therapy Diagnosis : Generalized weakness OT Plan OT Frequency: Min 2X/week OT Treatment/Interventions: Self-care/ADL training;Energy conservation;DME and/or AE instruction;Therapeutic activities;Cognitive remediation/compensation;Patient/family education;Balance training OT Recommendation Recommendations for Other Services: PT consult Follow Up Recommendations: Skilled nursing facility (pt declining- recommend outpatient pt only agreeable to considering outpatient services and states "I am not sure about that because someone would have to take me. I just think I'll be all right" Equipment Recommended: 3 in 1 bedside comode;Tub/shower bench Individuals Consulted Consulted and Agree with Results and Recommendations: Patient OT Goals Acute Rehab OT Goals OT Goal Formulation: With patient Time For Goal Achievement: 2  weeks ADL Goals Pt Will Transfer to Toilet: with modified independence;Supine HOB flat, rolling right/left for bedpan;Stand pivot transfer Pt Will Perform Toileting - Clothing Manipulation: with modified independence;with caregiver independent in assisting;Sitting on 3-in-1 or toilet Pt Will Perform Toileting - Hygiene: with modified independence;Sitting on 3-in-1 or toilet Additional ADL Goal #1: pt will perform berg with score >40 to demonstrated decreased fall risk with ADLS  OT Evaluation Precautions/Restrictions  Precautions Precautions: Fall Restrictions Weight Bearing Restrictions: No Prior Functioning Home Living Lives With: Spouse Receives Help From: Family Type of Home: House Home Layout: One level Home Access: Stairs to enter Entrance Stairs-Rails: Can reach both Entrance Stairs-Number of Steps: 5 at front, 2 at back [employer] Shower/Tub: Tub/shower unit [employer]: Handicapped height Bathroom Accessibility: Yes Home Adaptive Equipment: Walker - rolling (only uses when out of house or walking longer than "a block") Additional Comments: pt takes sponge bath sitting on toilet. Fearful of using tub/shower unit Prior Function Level of Independence: Needs assistance with homemaking;Requires assistive device for independence Able to Take Stairs?: Yes Driving: No (family friend or POA drives. sometimes takes a taxi) Vocation: Retired Comments: pt states "my wife does not like people in the house well let me see how to say this we dont like a lot of visitors" (pt declined home health or SNF level assistance) ADL ADL Eating/Feeding: Performed;Set up Where Assessed - Eating/Feeding: Edge of bed (declined OOB with therapist) Toilet Transfer: Simulated;Supervision/safety (definite use of BIL UE on bed surface and environmenta) ADL Comments: pt using urinal per pt statement. pt sit<>stand EOB S level with use of BIL UE for stability. Pt declined RW. pt states "i just hold onto  the doorways" Pt with Bil LE braced against EOB for reaching task. Pt agreeable to inpatient OT only. Declines all further follow up . However pt demonstrates high fall risk during limited mobility  during session. Pt BIL UE WFL ROM shoulder elbow and digits. Pt able to reach buttock for hyigene with standing. Pt describes SOB with mobility. Pt declined OOB<>chair stating "the lasix they have been on keeps me using the bottle and this is better" Pt supine<>sit EOB MOD I with rails and hob ~30 degrees (pt describes wife with balance deficits and "bad knees" ) Vision/Perception  Vision - History Baseline Vision: No visual deficits Patient Visual Report: No change from baseline Vision - Assessment Additional Comments: Pt with Lt lateral neck flexion due to contracture sitting EOB. pt unable to straighten neck to midline. Cognition Cognition Arousal/Alertness: Awake/alert Overall Cognitive Status: Appears within functional limits for tasks assessed Cognition - Other Comments: decreased awareness of balance deficits and when discussing deficits pt acknowledged deficits at baseline but states "i dont think i need all that right now" Sensation/Coordination   Extremity Assessment   Mobility    Exercises   End of Session OT - End of Session Activity Tolerance: Patient tolerated treatment well Patient left: in bed;with call bell in reach General Behavior During Session: [name] for tasks performed Cognition: [name] for tasks performed   Eduardo Deleon, Eduardo Deleon 05/21/2011, 12:03 PM  Pager: [phone]

## 2011-06-17 ENCOUNTER — Emergency Department (HOSPITAL_COMMUNITY): Payer: Medicare Other

## 2011-06-17 ENCOUNTER — Emergency Department (HOSPITAL_COMMUNITY)
Admission: EM | Admit: 2011-06-17 | Discharge: 2011-06-17 | Disposition: A | Discharge status: Home / Self Care | Payer: Medicare Other | Attending: Emergency Medicine | Admitting: Emergency Medicine

## 2011-06-17 DIAGNOSIS — Z7901 Long term (current) use of anticoagulants: Secondary | ICD-10-CM | POA: Insufficient documentation

## 2011-06-17 DIAGNOSIS — Z8673 Personal history of transient ischemic attack (TIA), and cerebral infarction without residual deficits: Secondary | ICD-10-CM | POA: Insufficient documentation

## 2011-06-17 DIAGNOSIS — Z79899 Other long term (current) drug therapy: Secondary | ICD-10-CM | POA: Insufficient documentation

## 2011-06-17 DIAGNOSIS — R55 Syncope and collapse: Secondary | ICD-10-CM | POA: Insufficient documentation

## 2011-06-17 DIAGNOSIS — I4891 Unspecified atrial fibrillation: Secondary | ICD-10-CM | POA: Insufficient documentation

## 2011-06-17 DIAGNOSIS — R51 Headache: Secondary | ICD-10-CM | POA: Insufficient documentation

## 2011-06-17 DIAGNOSIS — Z96659 Presence of unspecified artificial knee joint: Secondary | ICD-10-CM | POA: Insufficient documentation

## 2011-06-17 LAB — BASIC METABOLIC PANEL
CO2: 25 mEq/L (ref 19–32)
Calcium: 8.9 mg/dL (ref 8.4–10.5)
Chloride: 105 mEq/L (ref 96–112)
Creatinine, Ser: 2.12 mg/dL — ABNORMAL HIGH (ref 0.50–1.35)
Glucose, Bld: 104 mg/dL — ABNORMAL HIGH (ref 70–99)

## 2011-06-17 LAB — CBC
HCT: 30.7 % — ABNORMAL LOW (ref 39.0–52.0)
Hemoglobin: 9.6 g/dL — ABNORMAL LOW (ref 13.0–17.0)
MCHC: 31.3 g/dL (ref 30.0–36.0)
MCV: 82.3 fL (ref 78.0–100.0)
RDW: 20.1 % — ABNORMAL HIGH (ref 11.5–15.5)

## 2011-06-17 NOTE — ED Notes (Signed)
Brain stem stroke x 1 yr. Ago.

## 2011-06-17 NOTE — ED Notes (Signed)
~   1hr. Ago near syncope episode- dizzy, weak; fell against door in house; no loc. No pain. Wants to be evaluated for blurry vision around the edges. Head doesn't feel right. States, "stuffiness."

## 2011-06-17 NOTE — ED Provider Notes (Signed)
History     CSN: [id number]  Arrival date & time 06/17/11  1545   First MD Initiated Contact with Patient 06/17/11 1549      Chief Complaint  Patient presents with  . Near Syncope    (Consider location/radiation/quality/duration/timing/severity/associated sxs/prior treatment) The history is provided by the patient.   patient reports developing an acute sudden abnormal sensation in his head.  He describes it as a "weakness" of his brain.  He denies nausea and vomiting.  He denies chest pain shortness of breath.  He has a history of atrial fibrillation and is on chronic Coumadin therapy.  He denies recent fall or head trauma.  He denies weakness in his upper lower extremities.  He denies upper respiratory symptoms.  He denies difficulty swallowing.  There's been no changes in his vision.  He reports when this occurred he had a near-syncopal episode without syncope.  He denied palpitations.  He has not tried any medication for his abnormal sensation in his head.  Nothing relieves his symptoms.  Nothing improves his symptoms.  Symptoms are improving on their own.  Past Medical History  Diagnosis Date  . 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 Atrial fibrillation   . Congestive heart failure     Past Surgical History  Procedure Date  . Joint replacement bilaterial knees  . Lumbar laminectomy Per patient  heriated disc in mid spine    No family history on file.  History  Substance Use Topics  . Smoking status: Not on file  . Smokeless tobacco: Never Used  . Alcohol Use: No      Review of Systems  All other systems reviewed and are negative.    Allergies  Review of patient's allergies indicates no known allergies.  Home Medications   Current Outpatient Rx  Name Route Sig Dispense Refill  . AMIODARONE HCL 200 MG PO TABS Oral Take 200 mg by mouth daily.      . ASPIRIN EC 81 MG PO TBEC Oral Take 81 mg by mouth daily.      Marland Kitchen VITAMIN D3 2000 UNITS PO TABS Oral Take 1 tablet by  mouth 1 day or 1 dose. 30 tablet 0  . DICLOFENAC SODIUM 50 MG PO TBEC Oral Take 50 mg by mouth 2 (two) times daily.      Marland Kitchen DOXAZOSIN MESYLATE 2 MG PO TABS Oral Take 2 mg by mouth at bedtime.      . DUTASTERIDE 0.5 MG PO CAPS Oral Take 0.5 mg by mouth daily.      Marland Kitchen FERROUS SULFATE 325 (65 FE) MG PO TABS Oral Take 1 tablet (325 mg total) by mouth 2 (two) times daily with a meal. 30 tablet 0  . FUROSEMIDE 20 MG PO TABS Oral Take 20 mg by mouth daily.      Marland Kitchen OMEPRAZOLE 20 MG PO CPDR Oral Take 1 capsule (20 mg total) by mouth 2 (two) times daily. 30 capsule 0  . POTASSIUM CHLORIDE 10 MEQ PO TBCR Oral Take 10 mEq by mouth daily.      . WARFARIN SODIUM 5 MG PO TABS Oral Take 5 mg by mouth daily.       BP 132/70  Pulse 54  Temp(Src) 98.2 F (36.8 C) (Oral)  Resp 18  SpO2 100%  Physical Exam  Nursing note and vitals reviewed. Constitutional: He is oriented to person, place, and time. He appears well-developed and well-nourished.  HENT:  Head: Normocephalic and atraumatic.  Eyes: EOM are normal.  Pupils are equal, round, and reactive to light.  Neck: Normal range of motion. Neck supple.       Patient with what appears to be torticollis and chronic tilting of his head to the left.  Cardiovascular: Normal rate, regular rhythm, normal heart sounds and intact distal pulses.   Pulmonary/Chest: Effort normal and breath sounds normal. No respiratory distress.  Abdominal: Soft. He exhibits no distension. There is no tenderness.  Musculoskeletal: Normal range of motion.  Neurological: He is alert and oriented to person, place, and time.  Skin: Skin is warm and dry.  Psychiatric: He has a normal mood and affect. Judgment normal.    ED Course  Procedures (including critical care time)  Labs Reviewed  CBC - Abnormal; Notable for the following:    RBC 3.73 (*)    Hemoglobin 9.6 (*)    HCT 30.7 (*)    MCH 25.7 (*)    RDW 20.1 (*)    All other components within normal limits  PROTIME-INR -  Abnormal; Notable for the following:    Prothrombin Time 29.9 (*)    INR 2.79 (*)    All other components within normal limits  BASIC METABOLIC PANEL - Abnormal; Notable for the following:    Glucose, Bld 104 (*)    BUN 44 (*)    Creatinine, Ser 2.12 (*)    GFR calc non Af Amer 27 (*)    GFR calc Af Amer 31 (*)    All other components within normal limits   Ct Head Wo Contrast  06/17/2011  *RADIOLOGY REPORT*  Clinical Data: Altered mental status.  Confusion.  CT HEAD WITHOUT CONTRAST  Technique:  Contiguous axial images were obtained from the base of the skull through the vertex without contrast.  Comparison: None.  Findings: Positioning is nonstandard as the patient cannot straighten his head.  No evidence of acute abnormality including acute infarction, hemorrhage, mass lesion, mass effect, midline shift or abnormal extra-axial fluid collection is identified.  No fracture. No hydrocephalus is identified.  IMPRESSION: No acute finding.  Original Report Authenticated By: Bernadene Bell. Maricela Curet, M.D.   I personally reviewed his CT scan  1. Headache       MDM  Unclear what this patient's symptoms are however given his discomfort in his head as well as his Coumadin therapy and will obtain a CT scan of his head to evaluate for intracranial pathology including bleeding.  His normal upper lower extremity exam and a normal neurologic exam.  Altered his basic labs and evaluate his INR today  5:44 PM Patient continues to feel better at this time.  Unclear etiology.  Close followup with his primary care doctor.  Doubt stroke.  Normal neurologic exam.        Lyanne Co, MD 06/17/11 1744

## 2011-06-17 NOTE — ED Notes (Signed)
Pt. Started on new blood pressure medications and doses upped on some bp medications. Symptoms started last night.

## 2011-06-28 ENCOUNTER — Inpatient Hospital Stay (HOSPITAL_COMMUNITY)
Admission: EM | Admit: 2011-06-28 | Discharge: 2011-07-01 | DRG: 603 | Disposition: A | Discharge status: Home Health | Payer: Medicare Other | Attending: Internal Medicine | Admitting: Internal Medicine

## 2011-06-28 ENCOUNTER — Other Ambulatory Visit: Payer: Self-pay

## 2011-06-28 ENCOUNTER — Encounter (HOSPITAL_COMMUNITY): Payer: Self-pay

## 2011-06-28 DIAGNOSIS — L03116 Cellulitis of left lower limb: Secondary | ICD-10-CM | POA: Diagnosis present

## 2011-06-28 DIAGNOSIS — I872 Venous insufficiency (chronic) (peripheral): Secondary | ICD-10-CM

## 2011-06-28 DIAGNOSIS — Z96659 Presence of unspecified artificial knee joint: Secondary | ICD-10-CM

## 2011-06-28 DIAGNOSIS — I4891 Unspecified atrial fibrillation: Secondary | ICD-10-CM | POA: Diagnosis present

## 2011-06-28 DIAGNOSIS — D649 Anemia, unspecified: Secondary | ICD-10-CM | POA: Diagnosis present

## 2011-06-28 DIAGNOSIS — M436 Torticollis: Secondary | ICD-10-CM | POA: Diagnosis present

## 2011-06-28 DIAGNOSIS — L97909 Non-pressure chronic ulcer of unspecified part of unspecified lower leg with unspecified severity: Secondary | ICD-10-CM | POA: Diagnosis present

## 2011-06-28 DIAGNOSIS — R195 Other fecal abnormalities: Secondary | ICD-10-CM | POA: Diagnosis present

## 2011-06-28 DIAGNOSIS — I8310 Varicose veins of unspecified lower extremity with inflammation: Secondary | ICD-10-CM | POA: Diagnosis present

## 2011-06-28 DIAGNOSIS — R001 Bradycardia, unspecified: Secondary | ICD-10-CM | POA: Diagnosis present

## 2011-06-28 DIAGNOSIS — L03115 Cellulitis of right lower limb: Secondary | ICD-10-CM | POA: Diagnosis present

## 2011-06-28 DIAGNOSIS — I83009 Varicose veins of unspecified lower extremity with ulcer of unspecified site: Secondary | ICD-10-CM | POA: Diagnosis present

## 2011-06-28 DIAGNOSIS — Z7901 Long term (current) use of anticoagulants: Secondary | ICD-10-CM

## 2011-06-28 DIAGNOSIS — M7989 Other specified soft tissue disorders: Secondary | ICD-10-CM

## 2011-06-28 DIAGNOSIS — I878 Other specified disorders of veins: Secondary | ICD-10-CM | POA: Diagnosis present

## 2011-06-28 DIAGNOSIS — L02419 Cutaneous abscess of limb, unspecified: Principal | ICD-10-CM | POA: Diagnosis present

## 2011-06-28 DIAGNOSIS — I5022 Chronic systolic (congestive) heart failure: Secondary | ICD-10-CM | POA: Diagnosis present

## 2011-06-28 DIAGNOSIS — K219 Gastro-esophageal reflux disease without esophagitis: Secondary | ICD-10-CM | POA: Diagnosis present

## 2011-06-28 DIAGNOSIS — Z9229 Personal history of other drug therapy: Secondary | ICD-10-CM

## 2011-06-28 DIAGNOSIS — N289 Disorder of kidney and ureter, unspecified: Secondary | ICD-10-CM

## 2011-06-28 DIAGNOSIS — N179 Acute kidney failure, unspecified: Secondary | ICD-10-CM | POA: Diagnosis present

## 2011-06-28 DIAGNOSIS — I509 Heart failure, unspecified: Secondary | ICD-10-CM | POA: Diagnosis present

## 2011-06-28 DIAGNOSIS — D500 Iron deficiency anemia secondary to blood loss (chronic): Secondary | ICD-10-CM | POA: Diagnosis present

## 2011-06-28 DIAGNOSIS — N189 Chronic kidney disease, unspecified: Secondary | ICD-10-CM | POA: Diagnosis present

## 2011-06-28 HISTORY — DX: Anemia, unspecified: D64.9

## 2011-06-28 LAB — COMPREHENSIVE METABOLIC PANEL
ALT: 17 U/L (ref 0–53)
AST: 20 U/L (ref 0–37)
Albumin: 3.4 g/dL — ABNORMAL LOW (ref 3.5–5.2)
Alkaline Phosphatase: 74 U/L (ref 39–117)
CO2: 26 mEq/L (ref 19–32)
Chloride: 106 mEq/L (ref 96–112)
GFR calc non Af Amer: 36 mL/min — ABNORMAL LOW (ref >90)
Potassium: 3.8 mEq/L (ref 3.5–5.1)
Sodium: 142 mEq/L (ref 135–145)
Total Bilirubin: 0.4 mg/dL (ref 0.3–1.2)

## 2011-06-28 LAB — PROTIME-INR: Prothrombin Time: 27.2 seconds — ABNORMAL HIGH (ref 11.6–15.2)

## 2011-06-28 LAB — CBC
HCT: 27.7 % — ABNORMAL LOW (ref 39.0–52.0)
MCHC: 31.8 g/dL (ref 30.0–36.0)
RDW: 20.1 % — ABNORMAL HIGH (ref 11.5–15.5)
WBC: 4.1 10*3/uL (ref 4.0–10.5)

## 2011-06-28 LAB — DIFFERENTIAL
Basophils Absolute: 0 10*3/uL (ref 0.0–0.1)
Basophils Relative: 1 % (ref 0–1)
Lymphocytes Relative: 24 % (ref 12–46)
Neutro Abs: 2.8 10*3/uL (ref 1.7–7.7)
Neutrophils Relative %: 68 % (ref 43–77)

## 2011-06-28 LAB — URINALYSIS, ROUTINE W REFLEX MICROSCOPIC
Bilirubin Urine: NEGATIVE
Glucose, UA: NEGATIVE mg/dL
Hgb urine dipstick: NEGATIVE
Ketones, ur: NEGATIVE mg/dL
Protein, ur: NEGATIVE mg/dL

## 2011-06-28 LAB — OCCULT BLOOD, POC DEVICE: Fecal Occult Bld: POSITIVE

## 2011-06-28 MED ORDER — ACETAMINOPHEN 325 MG PO TABS: 650.0000 mg | ORAL_TABLET | Freq: Four times a day (QID) | ORAL | Status: DC | PRN

## 2011-06-28 MED ORDER — METOPROLOL TARTRATE 25 MG PO TABS
25.0000 mg | ORAL_TABLET | Freq: Two times a day (BID) | ORAL | Status: DC
  Administered 2011-06-29: 25 mg via ORAL
  Filled 2011-06-28 (×3): qty 1

## 2011-06-28 MED ORDER — ACETAMINOPHEN 650 MG RE SUPP: 650.0000 mg | Freq: Four times a day (QID) | RECTAL | Status: DC | PRN

## 2011-06-28 MED ORDER — PANTOPRAZOLE SODIUM 40 MG PO TBEC
40.0000 mg | DELAYED_RELEASE_TABLET | Freq: Every day | ORAL | Status: DC
  Administered 2011-06-29 – 2011-07-01 (×3): 40 mg via ORAL
  Filled 2011-06-28 (×3): qty 1

## 2011-06-28 MED ORDER — VANCOMYCIN HCL IN DEXTROSE 1-5 GM/200ML-% IV SOLN
1000.0000 mg | Freq: Once | INTRAVENOUS | Status: AC
  Administered 2011-06-28: 1000 mg via INTRAVENOUS
  Filled 2011-06-28: qty 200

## 2011-06-28 MED ORDER — WARFARIN SODIUM 5 MG PO TABS: 5.0000 mg | ORAL_TABLET | ORAL | Status: DC

## 2011-06-28 MED ORDER — DUTASTERIDE 0.5 MG PO CAPS
0.5000 mg | ORAL_CAPSULE | Freq: Every day | ORAL | Status: DC
  Administered 2011-06-29 – 2011-07-01 (×3): 0.5 mg via ORAL
  Filled 2011-06-28 (×3): qty 1

## 2011-06-28 MED ORDER — DOXAZOSIN MESYLATE 2 MG PO TABS
2.0000 mg | ORAL_TABLET | Freq: Every day | ORAL | Status: DC
  Administered 2011-06-29 – 2011-06-30 (×2): 2 mg via ORAL
  Filled 2011-06-28 (×3): qty 1

## 2011-06-28 MED ORDER — SODIUM CHLORIDE 0.9 % IV SOLN: 250.0000 mL | INTRAVENOUS | Status: DC | PRN

## 2011-06-28 MED ORDER — VANCOMYCIN HCL IN DEXTROSE 1-5 GM/200ML-% IV SOLN
1000.0000 mg | INTRAVENOUS | Status: DC
  Filled 2011-06-28: qty 200

## 2011-06-28 MED ORDER — VANCOMYCIN HCL 10 G IV SOLR: 1000.0000 mg | Freq: Once | INTRAVENOUS | Status: DC

## 2011-06-28 MED ORDER — MUPIROCIN CALCIUM 2 % EX CREA
TOPICAL_CREAM | Freq: Two times a day (BID) | CUTANEOUS | Status: DC
  Administered 2011-06-28 – 2011-07-01 (×6): via TOPICAL
  Filled 2011-06-28: qty 15

## 2011-06-28 MED ORDER — WARFARIN SODIUM 2.5 MG PO TABS: 2.5000 mg | ORAL_TABLET | Freq: Every day | ORAL | Status: DC

## 2011-06-28 MED ORDER — FERROUS SULFATE 325 (65 FE) MG PO TABS
325.0000 mg | ORAL_TABLET | Freq: Every day | ORAL | Status: DC
Start: 2011-06-29 — End: 2011-07-01
  Administered 2011-06-29 – 2011-07-01 (×3): 325 mg via ORAL
  Filled 2011-06-28 (×3): qty 1

## 2011-06-28 MED ORDER — SENNOSIDES-DOCUSATE SODIUM 8.6-50 MG PO TABS
1.0000 | ORAL_TABLET | Freq: Every evening | ORAL | Status: DC | PRN
  Filled 2011-06-28: qty 1

## 2011-06-28 MED ORDER — ASPIRIN EC 81 MG PO TBEC
81.0000 mg | DELAYED_RELEASE_TABLET | Freq: Every day | ORAL | Status: DC
  Administered 2011-06-29 – 2011-07-01 (×3): 81 mg via ORAL
  Filled 2011-06-28 (×3): qty 1

## 2011-06-28 MED ORDER — FUROSEMIDE 20 MG PO TABS
20.0000 mg | ORAL_TABLET | Freq: Every day | ORAL | Status: DC
  Administered 2011-06-29: 20 mg via ORAL
  Filled 2011-06-28: qty 1

## 2011-06-28 MED ORDER — LEVOFLOXACIN IN D5W 750 MG/150ML IV SOLN
750.0000 mg | INTRAVENOUS | Status: DC
  Administered 2011-06-28 – 2011-06-30 (×2): 750 mg via INTRAVENOUS
  Filled 2011-06-28 (×2): qty 150

## 2011-06-28 MED ORDER — SODIUM CHLORIDE 0.9 % IJ SOLN: 3.0000 mL | INTRAMUSCULAR | Status: DC | PRN

## 2011-06-28 MED ORDER — POTASSIUM CHLORIDE CRYS ER 10 MEQ PO TBCR
10.0000 meq | EXTENDED_RELEASE_TABLET | Freq: Every day | ORAL | Status: DC
  Administered 2011-06-29: 10 meq via ORAL
  Filled 2011-06-28: qty 1

## 2011-06-28 MED ORDER — WARFARIN SODIUM 5 MG PO TABS
5.0000 mg | ORAL_TABLET | Freq: Once | ORAL | Status: DC
  Filled 2011-06-28: qty 1

## 2011-06-28 MED ORDER — INFLUENZA VIRUS VACC SPLIT PF IM SUSP
0.5000 mL | INTRAMUSCULAR | Status: AC
  Administered 2011-06-29: 0.5 mL via INTRAMUSCULAR
  Filled 2011-06-28: qty 0.5

## 2011-06-28 MED ORDER — SODIUM CHLORIDE 0.9 % IJ SOLN
3.0000 mL | Freq: Two times a day (BID) | INTRAMUSCULAR | Status: DC
  Administered 2011-06-29 – 2011-07-01 (×4): 3 mL via INTRAVENOUS

## 2011-06-28 MED ORDER — HYDROCODONE-ACETAMINOPHEN 5-325 MG PO TABS
1.0000 | ORAL_TABLET | Freq: Four times a day (QID) | ORAL | Status: DC | PRN
  Administered 2011-06-29: 1 via ORAL
  Filled 2011-06-28: qty 1

## 2011-06-28 MED ORDER — WARFARIN SODIUM 2.5 MG PO TABS
2.5000 mg | ORAL_TABLET | ORAL | Status: DC
  Filled 2011-06-28: qty 1

## 2011-06-28 NOTE — ED Notes (Signed)
Pts family friend left contact information. Beth Bramhall [phone]

## 2011-06-28 NOTE — ED Provider Notes (Addendum)
History     CSN: [id number]  Arrival date & time 06/28/11  1241   First MD Initiated Contact with Patient 06/28/11 1524      Chief Complaint  Patient presents with  . Wound Infection    (Consider location/radiation/quality/duration/timing/severity/associated sxs/prior treatment) The history is provided by the patient.  Chief complaint is a draining blister of his right leg. 78 year old male had been admitted to the hospital in November for a draining blister on his left leg which apparently had been treated cellulitis. He was discharged home with wound care and has been having dressings changed 3 times a week. He states that 2 weeks ago, the drainage from his left leg finally stopped. At about that time, his right leg has started having some increased swelling and blistering and it started draining yesterday. He denies pain and denies fever, chills, or sweats. He denies dyspnea. He is also noted some swelling of his thighs. Symptoms are moderate to severe. Nothing makes it better and nothing makes it worse.  Past Medical History  Diagnosis Date  . 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 Campath-induced atrial fibrillation   . Congestive heart failure     Past Surgical History  Procedure Date  . Joint replacement bilaterial knees  . Lumbar laminectomy Per patient  heriated disc in mid spine    No family history on file.  History  Substance Use Topics  . Smoking status: Not on file  . Smokeless tobacco: Never Used  . Alcohol Use: No      Review of Systems  All other systems reviewed and are negative.    Allergies  Review of patient's allergies indicates no known allergies.  Home Medications   Current Outpatient Rx  Name Route Sig Dispense Refill  . ASPIRIN EC 81 MG PO TBEC Oral Take 81 mg by mouth daily.      Marland Kitchen VITAMIN D 1000 UNITS PO TABS Oral Take 2,000 Units by mouth daily.      Marland Kitchen DOXAZOSIN MESYLATE 2 MG PO TABS Oral Take 2 mg by mouth at bedtime.      . DUTASTERIDE  0.5 MG PO CAPS Oral Take 0.5 mg by mouth daily.      . FUROSEMIDE 20 MG PO TABS Oral Take 20 mg by mouth daily.      Marland Kitchen METOPROLOL TARTRATE 25 MG PO TABS Oral Take 25 mg by mouth 2 (two) times daily.      Marland Kitchen OMEPRAZOLE 20 MG PO CPDR Oral Take 1 capsule (20 mg total) by mouth 2 (two) times daily. 30 capsule 0  . POTASSIUM CHLORIDE 10 MEQ PO TBCR Oral Take 10 mEq by mouth daily.     . WARFARIN SODIUM 5 MG PO TABS Oral Take 2.5-5 mg by mouth at bedtime. 1 tablet daily except on Fridays and Mondays patient takes 0.5 tablet    . FERROUS SULFATE 325 (65 FE) MG PO TABS Oral Take 1 tablet (325 mg total) by mouth 2 (two) times daily with a meal. 30 tablet 0    BP 116/54  Pulse 49  Temp(Src) 98.4 F (36.9 C) (Oral)  Resp 18  SpO2 99%  Physical Exam  Nursing note and vitals reviewed.  78 year old male who is resting comfortably and in no acute distress. Vital signs are significant for bradycardia with pulse rate of 49. Oxygen saturation is 99% which is normal. Head is normocephalic and atraumatic. A left facial droop is noted, and he holds his head tilted to the left.  Neck is supple without adenopathy or JVD. Back is nontender with no presacral edema. Lungs are clear without rales, wheezes, rhonchi. Heart tones are crisp and no murmur is heard. Abdomen is soft, flat, nontender without masses or hepatosplenomegaly. Rectal exam shows decreased sphincter tone, small external hemorrhoids present, stool greenish black and sent for Hemoccult testing. Extremities: Severe venous stasis changes are present bilaterally. Dressings are present in both lower legs which are removed. There is 2-3+ edema of the legs extending up to the proximal thighs. There is no tenderness to palpation over the inguinal vessels. A large bulla is present on the left leg posteriorly, and a smaller bulla present on the left lower leg anteromedially. There is rather intense erythema of the lower portion of the lower leg with significant warmth.  There is no significant tenderness. Right lower leg has less intense erythema but is also warm to the touch. There is an ulceration which has slight drainage but does not appear overtly infected at this time. Distal neurovascular exam is intact with capillary refill of one to 2 seconds and normal pulses. Skin is otherwise warm and moist without other rash present. Neurologic: Mental status is normal, cranial nerves are significant for the facial droop and head being held tilted to the left. Otherwise, there no focal motor or sensory deficits. Psychiatric: No abnormalities of mood or affect.  ED Course  Procedures (including critical care time)   Labs Reviewed  CBC  DIFFERENTIAL  COMPREHENSIVE METABOLIC PANEL  URINALYSIS, ROUTINE W REFLEX MICROSCOPIC  PROTIME-INR  SEDIMENTATION RATE   No results found. Results for orders placed during the hospital encounter of 06/28/11  CBC      Component Value Range   WBC 4.1  4.0 - 10.5 (K/uL)   RBC 3.36 (*) 4.22 - 5.81 (MIL/uL)   Hemoglobin 8.8 (*) 13.0 - 17.0 (g/dL)   HCT 16.1 (*) 09.6 - 52.0 (%)   MCV 82.4  78.0 - 100.0 (fL)   MCH 26.2  26.0 - 34.0 (pg)   MCHC 31.8  30.0 - 36.0 (g/dL)   RDW 04.5 (*) 40.9 - 15.5 (%)   Platelets 156  150 - 400 (K/uL)  DIFFERENTIAL      Component Value Range   Neutrophils Relative 68  43 - 77 (%)   Neutro Abs 2.8  1.7 - 7.7 (K/uL)   Lymphocytes Relative 24  12 - 46 (%)   Lymphs Abs 1.0  0.7 - 4.0 (K/uL)   Monocytes Relative 6  3 - 12 (%)   Monocytes Absolute 0.2  0.1 - 1.0 (K/uL)   Eosinophils Relative 2  0 - 5 (%)   Eosinophils Absolute 0.1  0.0 - 0.7 (K/uL)   Basophils Relative 1  0 - 1 (%)   Basophils Absolute 0.0  0.0 - 0.1 (K/uL)  COMPREHENSIVE METABOLIC PANEL      Component Value Range   Sodium 142  135 - 145 (mEq/L)   Potassium 3.8  3.5 - 5.1 (mEq/L)   Chloride 106  96 - 112 (mEq/L)   CO2 26  19 - 32 (mEq/L)   Glucose, Bld 94  70 - 99 (mg/dL)   BUN 30 (*) 6 - 23 (mg/dL)   Creatinine, Ser 8.11  (*) 0.50 - 1.35 (mg/dL)   Calcium 8.9  8.4 - 91.4 (mg/dL)   Total Protein 6.6  6.0 - 8.3 (g/dL)   Albumin 3.4 (*) 3.5 - 5.2 (g/dL)   AST 20  0 - 37 (U/L)   ALT  17  0 - 53 (U/L)   Alkaline Phosphatase 74  39 - 117 (U/L)   Total Bilirubin 0.4  0.3 - 1.2 (mg/dL)   GFR calc non Af Amer 36 (*) >90 (mL/min)   GFR calc Af Amer 42 (*) >90 (mL/min)  URINALYSIS, ROUTINE W REFLEX MICROSCOPIC      Component Value Range   Color, Urine YELLOW  YELLOW    APPearance CLEAR  CLEAR    Specific Gravity, Urine 1.012  1.005 - 1.030    pH 5.5  5.0 - 8.0    Glucose, UA NEGATIVE  NEGATIVE (mg/dL)   Hgb urine dipstick NEGATIVE  NEGATIVE    Bilirubin Urine NEGATIVE  NEGATIVE    Ketones, ur NEGATIVE  NEGATIVE (mg/dL)   Protein, ur NEGATIVE  NEGATIVE (mg/dL)   Urobilinogen, UA 0.2  0.0 - 1.0 (mg/dL)   Nitrite NEGATIVE  NEGATIVE    Leukocytes, UA NEGATIVE  NEGATIVE   PROTIME-INR      Component Value Range   Prothrombin Time 27.2 (*) 11.6 - 15.2 (seconds)   INR 2.47 (*) 0.00 - 1.49   SEDIMENTATION RATE      Component Value Range   Sed Rate 16  0 - 16 (mm/hr)  OCCULT BLOOD, POC DEVICE      Component Value Range   Fecal Occult Bld POSITIVE     Ct Head Wo Contrast  06/17/2011  *RADIOLOGY REPORT*  Clinical Data: Altered mental status.  Confusion.  CT HEAD WITHOUT CONTRAST  Technique:  Contiguous axial images were obtained from the base of the skull through the vertex without contrast.  Comparison: None.  Findings: Positioning is nonstandard as the patient cannot straighten his head.  No evidence of acute abnormality including acute infarction, hemorrhage, mass lesion, mass effect, midline shift or abnormal extra-axial fluid collection is identified.  No fracture. No hydrocephalus is identified.  IMPRESSION: No acute finding.  Original Report Authenticated By: Bernadene Bell. Maricela Curet, M.D.    Date: 06/28/2011  Rate: 51  Rhythm: sinus bradycardia  QRS Axis: normal  Intervals: normal  ST/T Wave abnormalities:  normal  Conduction Disutrbances:Incomplete right bundle-branch block  Narrative Interpretation: Low voltage, incomplete right bundle branch block, sinus bradycardia. When compared with ECG of 03/01/2010, rate is slower, but no other significant changes are present.  Old EKG Reviewed: unchanged     No diagnosis found.  Case is discussed with Dr. Laural Benes of [facility] who agrees to come and admit the patient. Because of recurrent episode of cellulitis, you started empirically on vancomycin.  MDM  Previous records have been reviewed. He had been admitted for cellulitis and draining ulceration of his left leg in November. He had been discharged with referral to the wound care clinic. Today, I am very concerned about possibility of superinfection with recurrence of cellulitis. I am also concerned about possibility of DVT in spite of therapy with Coumadin.       Dione Booze, MD 06/28/11 1807  Dione Booze, MD 06/28/11 [phone]

## 2011-06-28 NOTE — Progress Notes (Signed)
*  PRELIMINARY RESULTS* Bilateral lower extremity venous duplex completed. No evidence of DVT, superficial thrombosis, or Baker's cystMilta Deiters, [place] D 06/28/2011, 4:50 PM

## 2011-06-28 NOTE — Progress Notes (Signed)
ANTIBIOTIC/ANTICOAGULATION CONSULT NOTE - INITIAL  Pharmacy Consult for Levaquin, Vanc, Warfarin Indication: bilateral LLE cellulitis, afib  No Known Allergies  Patient Measurements:   Adjusted Body Weight:   Vital Signs: Temp: 98.4 F (36.9 C) (01/03 1323) Temp src: Oral (01/03 1323) BP: 116/54 mmHg (01/03 1323) Pulse Rate: 49  (01/03 1323) Intake/Output from previous day:   Intake/Output from this shift:    Labs:  Basename 06/28/11 1600  WBC 4.1  HGB 8.8*  PLT 156  LABCREA --  CREATININE 1.66*   Labs:  Basename 06/28/11 1600  HGB 8.8*  HCT 27.7*  PLT 156  APTT --  LABPROT 27.2*  INR 2.47*  HEPARINUNFRC --  CREATININE 1.66*  CKTOTAL --  CKMB --  TROPONINI --   The CrCl is unknown because both a height and weight (above a minimum accepted value) are required for this calculation.  No results found for this basename: VANCOTROUGH:2,VANCOPEAK:2,VANCORANDOM:2,GENTTROUGH:2,GENTPEAK:2,GENTRANDOM:2,TOBRATROUGH:2,TOBRAPEAK:2,TOBRARND:2,AMIKACINPEAK:2,AMIKACINTROU:2,AMIKACIN:2, in the last 72 hours   Microbiology: No results found for this or any previous visit (from the past 720 hour(s)).  Medical History: Past Medical History  Diagnosis Date  . 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 Campath-induced atrial fibrillation   . Congestive heart failure   . Anemia   . Arthritis     oa    Medications:  Scheduled:    . influenza  inactive virus vaccine  0.5 mL Intramuscular Tomorrow-1000  . mupirocin cream   Topical BID  . vancomycin  1,000 mg Intravenous Once  . DISCONTD: vancomycin  1,000 mg Intravenous Once   Infusions:   Assessment:  21 YOM admitted with recurrent lower extremity cellulitis, MD concerning for superinfection to start Vanc and Levaquin per pharmacy. No X-ray to r/o osteo so will aim for higher vanc trough for now.   Vancomycin 1gm IV x 1 given at 1851 on 06/28/11  Scr 1.66, CrCl 32 ml/min   Coumadin 5 mg daily except 2.5 mg on Mondays for  h/o afib. INR therapeutic at 2.45, last Coumadin dose 06/27/2011  Hgb 8.8, no bleeding/complications reported  Goal of Therapy:  Vancomycin trough level 15-20 mcg/ml  Plan:   Levaquin 750 mg IV q48hours, first dose now  Vancomycin 1 gm IV q24h, next dose due 1/4 at 1900  Coumadin 5 mg po x 1 tonight  Daily PT/INR  If no concern for osteo, would decrease Vanc to achieve trough level of 10-15 mcg/mL  Geoffry Paradise Thi 06/28/2011,9:35 PM

## 2011-06-28 NOTE — H&P (Signed)
History and Physical Examination  Date: 06/28/2011  Patient name: Eduardo Deleon Medical record number: [id number] Date of birth: September 30, 1924 Age: 78 y.o. Gender: male PCP: Dr. Hulda Marin [facility]  Attending physician: Osvaldo Shipper MD   Chief Complaint  Patient presents with  . Wound Infection - right leg looks red    History of Present Illness: Eduardo Deleon is an 78 y.o. male with a long history of chronic venous stasis dermatitis and edema of both lower extremities with peripheral vascular disease who presents to the emergency department today because he became concerned about noticing increasing redness in the right leg above the knee cap.  He has recently traveled cellulitis of the left lower extremity just recently completing a course of antibiotics.  He was seen in the emergency department in November of 2012 for the left leg.  He has been receiving wound care at home.  He develops extensive bullous lesions on the lower extremities that have become fluid-filled and require incision and drainage. He has developed two large bullous lesions on the left lower extremity in addition to redness and drainage in some areas. He denies fever and chills.  He does report that he experienced pain in the left  leg last night and current discomfort in the right leg. He has atrial fibrillation and taking Coumadin and his PT is currently therapeutic.  Hospital admission was requested for treatment of his presumed cellulitis of the right lower extremity and questionable cellulitis of the left lower extremity.  Past Medical History Past Medical History  Diagnosis Date  . 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 Campath-induced atrial fibrillation   . Congestive heart failure   . Anemia   . Arthritis     oa  chronic venous stasis bilateral lower extremities Current Torticollis of neck Cerebrovascular disease Peripheral vascular disease  Past Surgical History Past Surgical History    Procedure Date  . Joint replacement bilaterial knees  . Lumbar laminectomy Per patient  heriated disc in mid spine  . Hernia repair     [phone], right - 1985    Home Meds: Prior to Admission medications   Medication Sig Start Date End Date Taking? Authorizing Provider  aspirin EC 81 MG tablet Take 81 mg by mouth daily.     Yes Historical Provider, MD  cholecalciferol (VITAMIN D) 1000 UNITS tablet Take 2,000 Units by mouth daily.     Yes Historical Provider, MD  doxazosin (CARDURA) 2 MG tablet Take 2 mg by mouth at bedtime.     Yes Historical Provider, MD  dutasteride (AVODART) 0.5 MG capsule Take 0.5 mg by mouth daily.     Yes Historical Provider, MD  furosemide (LASIX) 20 MG tablet Take 20 mg by mouth daily.     Yes Historical Provider, MD  metoprolol tartrate (LOPRESSOR) 25 MG tablet Take 25 mg by mouth 2 (two) times daily.     Yes Historical Provider, MD  omeprazole (PRILOSEC) 20 MG capsule Take 1 capsule (20 mg total) by mouth 2 (two) times daily. 05/21/11  Yes Simbiso Ranga  potassium chloride (KLOR-CON) 10 MEQ CR tablet Take 10 mEq by mouth daily.    Yes Historical Provider, MD  warfarin (COUMADIN) 5 MG tablet Take 2.5-5 mg by mouth at bedtime. 1 tablet daily except on Fridays and Mondays patient takes 0.5 tablet   Yes Historical Provider, MD  ferrous sulfate 325 (65 FE) MG tablet Take 1 tablet (325 mg total) by mouth 2 (two) times daily with  a meal. 05/21/11 05/20/12  Simbiso Ranga    Allergies: Review of patient's allergies indicates no known allergies.  Social History:  History   Social History  . Marital Status: Married    Spouse Name: N/A    Number of Children: N/A  . Years of Education: N/A   Occupational History  . Not on file.   Social History Main Topics  . Smoking status: Never Smoker   . Smokeless tobacco: Never Used  . Alcohol Use: No  . Drug Use: No  . Sexually Active:    Other Topics Concern  . Not on file   Social History Narrative  . No  narrative on file   Family History: History reviewed. No pertinent family history.  Review of Systems: Pertinent items are noted in HPI. All other systems reviewed and reported as negative.   Physical Exam: Blood pressure 116/54, pulse 49, temperature 98.4 F (36.9 C), temperature source Oral, resp. rate 18, SpO2 99.00%. General appearance: alert, cooperative, appears older than stated age and no distress Head: Normocephalic, without obvious abnormality, atraumatic Eyes: negative Nose: Nares normal. Septum midline. Mucosa normal. No drainage or sinus tenderness., no discharge Throat: moist mucous membranes Neck: no adenopathy, no JVD, supple, symmetrical, trachea midline, thyroid not enlarged, symmetric, no tenderness/mass/nodules and torticollis Lungs: clear to auscultation bilaterally Heart: irregularly irregular rhythm and S1, S2 normal Abdomen: soft, non-tender; bowel sounds normal; no masses,  no organomegaly Rectal: guaiac-positive stool Extremities: varicose veins noted, venous stasis dermatitis noted and scan up over her right knee with surrounding erythema and heat consistent with cellulitis, multiple areas in the right lower extremity redness and ulceration,,  excoriation scaling and ulceration noted on the left lower extremity with 2 large bolus lesions fluid-filled present,  toe amputation noted  wound  healing Pulses: 1+ symmetric pulses bilateral lower extremities Skin: skin exam as above Neurologic: Grossly normal  Lab  And Imaging results:  Results for orders placed during the hospital encounter of 06/28/11 (from the past 24 hour(s))  URINALYSIS, ROUTINE W REFLEX MICROSCOPIC     Status: Normal   Collection Time   06/28/11  3:52 PM      Component Value Range   Color, Urine YELLOW  YELLOW    APPearance CLEAR  CLEAR    Specific Gravity, Urine 1.012  1.005 - 1.030    pH 5.5  5.0 - 8.0    Glucose, UA NEGATIVE  NEGATIVE (mg/dL)   Hgb urine dipstick NEGATIVE  NEGATIVE     Bilirubin Urine NEGATIVE  NEGATIVE    Ketones, ur NEGATIVE  NEGATIVE (mg/dL)   Protein, ur NEGATIVE  NEGATIVE (mg/dL)   Urobilinogen, UA 0.2  0.0 - 1.0 (mg/dL)   Nitrite NEGATIVE  NEGATIVE    Leukocytes, UA NEGATIVE  NEGATIVE   CBC     Status: Abnormal   Collection Time   06/28/11  4:00 PM      Component Value Range   WBC 4.1  4.0 - 10.5 (K/uL)   RBC 3.36 (*) 4.22 - 5.81 (MIL/uL)   Hemoglobin 8.8 (*) 13.0 - 17.0 (g/dL)   HCT 81.1 (*) 91.4 - 52.0 (%)   MCV 82.4  78.0 - 100.0 (fL)   MCH 26.2  26.0 - 34.0 (pg)   MCHC 31.8  30.0 - 36.0 (g/dL)   RDW 78.2 (*) 95.6 - 15.5 (%)   Platelets 156  150 - 400 (K/uL)  DIFFERENTIAL     Status: Normal   Collection Time   06/28/11  4:00 PM      Component Value Range   Neutrophils Relative 68  43 - 77 (%)   Neutro Abs 2.8  1.7 - 7.7 (K/uL)   Lymphocytes Relative 24  12 - 46 (%)   Lymphs Abs 1.0  0.7 - 4.0 (K/uL)   Monocytes Relative 6  3 - 12 (%)   Monocytes Absolute 0.2  0.1 - 1.0 (K/uL)   Eosinophils Relative 2  0 - 5 (%)   Eosinophils Absolute 0.1  0.0 - 0.7 (K/uL)   Basophils Relative 1  0 - 1 (%)   Basophils Absolute 0.0  0.0 - 0.1 (K/uL)  COMPREHENSIVE METABOLIC PANEL     Status: Abnormal   Collection Time   06/28/11  4:00 PM      Component Value Range   Sodium 142  135 - 145 (mEq/L)   Potassium 3.8  3.5 - 5.1 (mEq/L)   Chloride 106  96 - 112 (mEq/L)   CO2 26  19 - 32 (mEq/L)   Glucose, Bld 94  70 - 99 (mg/dL)   BUN 30 (*) 6 - 23 (mg/dL)   Creatinine, Ser 0.45 (*) 0.50 - 1.35 (mg/dL)   Calcium 8.9  8.4 - 40.9 (mg/dL)   Total Protein 6.6  6.0 - 8.3 (g/dL)   Albumin 3.4 (*) 3.5 - 5.2 (g/dL)   AST 20  0 - 37 (U/L)   ALT 17  0 - 53 (U/L)   Alkaline Phosphatase 74  39 - 117 (U/L)   Total Bilirubin 0.4  0.3 - 1.2 (mg/dL)   GFR calc non Af Amer 36 (*) >90 (mL/min)   GFR calc Af Amer 42 (*) >90 (mL/min)  PROTIME-INR     Status: Abnormal   Collection Time   06/28/11  4:00 PM      Component Value Range   Prothrombin Time 27.2 (*) 11.6 -  15.2 (seconds)   INR 2.47 (*) 0.00 - 1.49   SEDIMENTATION RATE     Status: Normal   Collection Time   06/28/11  4:00 PM      Component Value Range   Sed Rate 16  0 - 16 (mm/hr)  OCCULT BLOOD, POC DEVICE     Status: Normal   Collection Time   06/28/11  6:01 PM      Component Value Range   Fecal Occult Bld POSITIVE      EKG Results:  Orders placed during the hospital encounter of 06/28/11  . EKG 12-LEAD  . EKG 12-LEAD     Impression  Principal Problem:  *Cellulitis of right lower extremity Active Problems:  Cellulitis of left leg  A-fib  GERD (gastroesophageal reflux disease)  CHF (congestive heart failure)  Anemia  Lower extremity venous stasis  Venous stasis ulcer  Bradycardia  Torticollis  CRI (chronic renal insufficiency)  Lower GI bleeding  HX: long term anticoagulant use   Plan  The patient has been started on vancomycin and Levaquin IV through the emergency department we'll continue the IV antibiotics given this patient's long history of recurrent episodes of cellulitis related to his venous stasis in the lower terminates.  I'm concerned about his bradycardia we'll place him on telemetry for monitoring and consulting his cardiologist.  Local wound care and wound care team consult has been ordered.  Unless his pharmacy to monitor his Coumadin therapy in the hospital and follow PT.  Also asked for the pharmacists assistance with dosing his antibiotics.  Please see orders.  Standley Dakins MD  [facility] [place], [place] [phone] 06/28/2011, 6:53 PM

## 2011-06-28 NOTE — ED Notes (Signed)
Patient arrived by EMS with complaint of ongoing lower extremity blistering and drainage, arrived with bilateral legs wrapped. Seen in ED in November and December for same and has had blisters drained and re-bandged. Was admitted to hospital for same, reports ongoing edema and wound care center for same. Here for further evaluation and treatment,

## 2011-06-28 NOTE — ED Notes (Signed)
Attempted to give report x1 - RN unavailable at this time d/t getting report on another pt.

## 2011-06-29 ENCOUNTER — Other Ambulatory Visit: Payer: Self-pay

## 2011-06-29 DIAGNOSIS — R195 Other fecal abnormalities: Secondary | ICD-10-CM | POA: Diagnosis present

## 2011-06-29 LAB — COMPREHENSIVE METABOLIC PANEL
ALT: 14 U/L (ref 0–53)
Alkaline Phosphatase: 62 U/L (ref 39–117)
BUN: 28 mg/dL — ABNORMAL HIGH (ref 6–23)
CO2: 26 mEq/L (ref 19–32)
GFR calc Af Amer: 44 mL/min — ABNORMAL LOW (ref >90)
GFR calc non Af Amer: 38 mL/min — ABNORMAL LOW (ref >90)
Glucose, Bld: 97 mg/dL (ref 70–99)
Potassium: 4.2 mEq/L (ref 3.5–5.1)
Sodium: 141 mEq/L (ref 135–145)

## 2011-06-29 LAB — CBC
HCT: 27 % — ABNORMAL LOW (ref 39.0–52.0)
Hemoglobin: 8.8 g/dL — ABNORMAL LOW (ref 13.0–17.0)
MCH: 27 pg (ref 26.0–34.0)
RBC: 3.26 MIL/uL — ABNORMAL LOW (ref 4.22–5.81)

## 2011-06-29 LAB — C-REACTIVE PROTEIN: CRP: 0.55 mg/dL — ABNORMAL LOW (ref <0.60)

## 2011-06-29 LAB — PROTIME-INR
INR: 2.73 — ABNORMAL HIGH (ref 0.00–1.49)
Prothrombin Time: 29.4 seconds — ABNORMAL HIGH (ref 11.6–15.2)

## 2011-06-29 MED ORDER — VANCOMYCIN HCL 1000 MG IV SOLR
750.0000 mg | INTRAVENOUS | Status: DC
  Administered 2011-06-29 – 2011-06-30 (×2): 750 mg via INTRAVENOUS
  Filled 2011-06-29 (×3): qty 750

## 2011-06-29 MED ORDER — METOPROLOL TARTRATE 12.5 MG HALF TABLET
12.5000 mg | ORAL_TABLET | Freq: Every day | ORAL | Status: DC
  Filled 2011-06-29: qty 1

## 2011-06-29 MED ORDER — FUROSEMIDE 10 MG/ML IJ SOLN
40.0000 mg | Freq: Every day | INTRAMUSCULAR | Status: DC
  Administered 2011-06-29 – 2011-07-01 (×3): 40 mg via INTRAVENOUS
  Filled 2011-06-29 (×3): qty 4

## 2011-06-29 MED ORDER — WARFARIN SODIUM 2.5 MG PO TABS
2.5000 mg | ORAL_TABLET | Freq: Once | ORAL | Status: AC
  Administered 2011-06-29: 2.5 mg via ORAL
  Filled 2011-06-29: qty 1

## 2011-06-29 MED ORDER — POTASSIUM CHLORIDE CRYS ER 20 MEQ PO TBCR
20.0000 meq | EXTENDED_RELEASE_TABLET | Freq: Every day | ORAL | Status: DC
  Administered 2011-06-30 – 2011-07-01 (×2): 20 meq via ORAL
  Filled 2011-06-29 (×2): qty 1

## 2011-06-29 NOTE — Progress Notes (Signed)
Physical Therapy Evaluation Patient Details Name: Eduardo Deleon MRN: [id number] DOB: 1924-08-21 Today's Date: 06/29/2011 [id number] Ev2 Discharge Recommendation: Will benefit from HHPT and social services upon d/c  Problem List:  Patient Active Problem List  Diagnoses  . Cellulitis of left leg  . A-fib  . GERD (gastroesophageal reflux disease)  . BPH (benign prostatic hyperplasia)  . CHF (congestive heart failure)  . Anemia  . Lower extremity venous stasis  . Venous stasis ulcer  . Cellulitis of right lower extremity  . Bradycardia  . Torticollis  . CRI (chronic renal insufficiency)  . HX: long term anticoagulant use  . Heme positive stool    Past Medical History:  Past Medical History  Diagnosis Date  . 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 Campath-induced atrial fibrillation   . Congestive heart failure   . Anemia   . Arthritis     oa   Past Surgical History:  Past Surgical History  Procedure Date  . Joint replacement bilaterial knees  . Lumbar laminectomy Per patient  heriated disc in mid spine  . Hernia repair     [id number], right - 1985    PT Assessment/Plan/Recommendation PT Assessment Clinical Impression Statement: Patient admitted with LE cellulitis presents with history of falls in the home and with family member reporting unsafe home situation.  Likely to benefit from HHPT and social services to address issues and improve safety.  Will benefit from skilled PT in acute setting to maximize independence with gait/balance and stair negotiation for safe return home, PT Recommendation/Assessment: Patient will need skilled PT in the acute care venue PT Problem List: Decreased balance;Decreased mobility;Decreased safety awareness Barriers to Discharge: Inaccessible home environment PT Therapy Diagnosis : Abnormality of gait PT Plan PT Frequency: Min 3X/week PT Treatment/Interventions: Gait training;DME instruction;Patient/family education;Stair training;Balance  training PT Recommendation Follow Up Recommendations: Home health PT (HH social services) Equipment Recommended: None recommended by PT PT Goals  Acute Rehab PT Goals PT Goal Formulation: With patient Time For Goal Achievement: 7 days Pt will Ambulate: 51 - 150 feet;with modified independence;with least restrictive assistive device PT Goal: Ambulate - Progress: Not met Pt will Go Up / Down Stairs: 1-2 stairs;with least restrictive assistive device;with supervision PT Goal: Up/Down Stairs - Progress: Not met Pt will Perform Home Exercise Program: with supervision, verbal cues required/provided (for balance training) PT Goal: Perform Home Exercise Program - Progress: Not met  PT Evaluation Precautions/Restrictions  Precautions Precautions: Fall Required Braces or Orthoses: No Restrictions Weight Bearing Restrictions: No Prior Functioning  Home Living Lives With: Spouse (wife not in good health.) Receives Help From: Other (Comment) (Wife has HH aide.) Type of Home: House Home Layout: One level Home Access: Stairs to enter Entrance Stairs-Rails: None Entrance Stairs-Number of Steps: 2 Bathroom Shower/Tub: Other (comment);Tub/shower unit (Pt only spongebathes.) Bathroom Toilet: Handicapped height Bathroom Accessibility: No Home Adaptive Equipment: Walker - four wheeled;Straight cane;Bedside commode/3-in-1 Prior Function Level of Independence: Independent with basic ADLs;Requires assistive device for independence;Needs assistance with homemaking Meal Prep: Minimal Light Housekeeping: Total Driving: No Comments: reports not using device in the home, but uses walker when out (daughter states no room for walker in the home and reports both patient and wife tripping over obstacles in the home) Cognition Cognition Arousal/Alertness: Awake/alert Overall Cognitive Status: Appears within functional limits for tasks assessed Orientation Level: Oriented X4 Cognition - Other Comments:  potentially impaired judgement as per daughter 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 Sensation/Coordination Sensation Light Touch: Appears Intact  Extremity Assessment RUE Assessment RUE Assessment: Within Functional Limits LUE Assessment LUE Assessment: Within Functional Limits RLE Assessment RLE Assessment: Within Functional Limits (wrapped with Kerlix bandage length of lower leg and knee) LLE Assessment LLE Assessment: Within Functional Limits (wrapped with Kerlix bandage length of lower leg) Mobility (including Balance) Bed Mobility Bed Mobility: Yes Supine to Sit: 6: Modified independent (Device/Increase time) Sit to Supine - Right: 6: Modified independent (Device/Increase time) Transfers Sit to Stand: 5: Supervision;From bed Sit to Stand Details (indicate cue type and reason): for safety Stand to Sit: 6: Modified independent (Device/Increase time);To bed Stand to Sit Details: Pt states he only uses RW when out in the community. Ambulation/Gait Ambulation/Gait: Yes Ambulation/Gait Assistance: 5: Supervision Ambulation/Gait Assistance Details (indicate cue type and reason): for safety  Ambulation Distance (Feet): 50 Feet Assistive device: Rolling walker Gait Pattern: Decreased stride length  Posture/Postural Control Posture/Postural Control: Postural limitations Postural Limitations: h/o brain stem stroke with impaired cervical muscular control head/ neck rotated right with left lateral flexion and capital flexion Balance Balance Assessed: No Exercise    End of Session PT - End of Session Activity Tolerance: Patient tolerated treatment well Patient left: in bed;with call bell in reach General Behavior During Session: [facility] for tasks performed Cognition: [facility] for tasks performed  [facility] 06/29/2011, 4:32 PM

## 2011-06-29 NOTE — Consult Note (Signed)
WOC consult Note Reason for Consult: Bilateral LE wounds and blisters.  Patient is well known to me from previous admissions. He is supported at home by [facility] [name], he states), who visit every M-W-F.  Patient spends a great deal of time each day sitting in a recliner chair at home. Wound type: Venous insufficiency related ulcerations Measurement: Right knee:  3cm x 2.5cm.  Wound bed obscured by necrotic slough (brown). Right medial LE: 7cm x 6cm x .2cm  Unroofed blister/partial thickness wound.   Right lateral LE: 3cm x 3cm x .2cm  Partial thickness open area.  Left medial LE: 2cm x 1.5cm x .2cm partial thickness open area. Wound bed: except for right patellar wound, all are clean, pink, moist, wounds secondary to unroofed blisters. Drainage (amount, consistency, odor)  Wound drainage is moderate to large as LEs are quite edematous. Periwound:Three to five Intact blisters (smaller than those previously ruptured)  are present, Dry, scaling skin is noted on left LE at medial and lateral malleolus.  No maceration noted. Dressing procedure/placement/frequency:I will opt for conservative management at this time: compression is not indicated when LEs are weeping to this degree.  I will cover the open areas with xeroform gauze (antimicrobial and astringent) except for the right knee which we will cover with bactroban as initially ordered by MD.  Dressing changes will be once today, then twice daily for two days, then decrease to daily on Monday, 07/02/11.  Elevation of both LEs on pillows with heels floated while in bed will remedy the edema more quickly than compression. It may be appropriate to order case management to evaluate for a level of care for this patient that is higher than at home supported by [facility].  If you agree, please order. I will not follow, but will remain available to this patient and his medical team.  Please re-consult if needed. Thanks, Ladona Mow, MSN, RN, [facility], CWOCN  [phone])

## 2011-06-29 NOTE — Progress Notes (Signed)
PCP: Maryelizabeth Rowan, MD, MD  Brief HPI:  Eduardo Deleon is an 78 y.o. male with a long history of chronic venous stasis dermatitis and edema of both lower extremities with peripheral vascular disease who presents to the emergency department today because he became concerned about noticing increasing redness in the right leg above the knee cap. He has recently completed a course of antibiotics. He was seen in the emergency department in November of 2012 for the left leg. He has been receiving wound care at home. He develops extensive bullous lesions on the lower extremities that have become fluid-filled and require incision and drainage. He has developed two large bullous lesions on the left lower extremity in addition to redness and drainage in some areas. He denies fever and chills. He does report that he experienced pain in the left leg the night before admission. He has atrial fibrillation and taking Coumadin and his INR is currently therapeutic. Hospital admission was requested for treatment of his presumed cellulitis of the right lower extremity and questionable cellulitis of the left lower extremity.   Past medical history:   Past Medical History   Diagnosis  Date   .  Stroke  1978 lower brain stem   .  Campath-induced atrial fibrillation    .  Congestive heart failure    .  Anemia    .  Arthritis      oa   chronic venous stasis bilateral lower extremities  Current  Torticollis of neck  Cerebrovascular disease  Peripheral vascular disease  Consultants: [facility]  Procedures: None so far  Subjective: Patient concerned about his legs and the blisters. Denies pain at this time. No red blood in stool. No black stools.  Objective: Vital signs in last 24 hours: Temp:  [97.9 F (36.6 C)-98.4 F (36.9 C)] 98.2 F (36.8 C) (01/04 0459) Pulse Rate:  [42-51] 51  (01/04 0459) Resp:  [18-20] 18  (01/04 0459) BP: (98-119)/(46-69) 119/69 mmHg (01/04 0459) SpO2:  [95 %-99 %] 95 % (01/04  0459) Weight:  [78.2 kg (172 lb 6.4 oz)] 172 lb 6.4 oz (78.2 kg) (01/03 2157) Weight change:  Last BM Date: 06/28/11  Intake/Output from previous day: 01/03 0701 - 01/04 0700 In: 150 [IV Piggyback:150] Out: 400 [Urine:400] Intake/Output this shift:    General appearance: alert, cooperative, appears stated age and no distress Head: Normocephalic, without obvious abnormality, atraumatic Neck: no adenopathy, no carotid bruit, no JVD, supple, symmetrical, trachea midline and thyroid not enlarged, symmetric, no tenderness/mass/nodules Resp: clear to auscultation bilaterally Cardio: regular rate and rhythm, S1, S2 normal, no murmur, click, rub or gallop GI: soft, non-tender; bowel sounds normal; no masses,  no organomegaly Extremities: blisters and bulla noted in both legs. Draining clear liquid. Some edema and erythema is noted. Some warmth is present. Pulses: poor pulses Skin: as above Neurologic: Grossly normal  Lab Results:  Basename 06/29/11 0500 06/28/11 1600  WBC 3.6* 4.1  HGB 8.8* 8.8*  HCT 27.0* 27.7*  PLT 152 156   BMET  Basename 06/29/11 0500 06/28/11 1600  NA 141 142  K 4.2 3.8  CL 108 106  CO2 26 26  GLUCOSE 97 94  BUN 28* 30*  CREATININE 1.59* 1.66*  CALCIUM 8.6 8.9    Studies/Results: No results found.  Medications: I have reviewed the patient's current medications.  Assessment/Plan:  Principal Problem:  *Cellulitis of right lower extremity Active Problems:  Cellulitis of left leg  A-fib  GERD (gastroesophageal reflux disease)  CHF (congestive  heart failure)  Anemia  Lower extremity venous stasis  Venous stasis ulcer  Bradycardia  Torticollis  CRI (chronic renal insufficiency)  HX: long term anticoagulant use  Heme positive stool    1. Cellulitis of bilateral Lower Extremities with blisters: Continue IV antibiotics (Levaquin and Vanc). Wound care consult. No DVT noted. Try IV lasix to reduce edema.  2. Heme Positive Stool: Is also anemic  though its normocytic. Will consult GI as patient is on warfarin. He is followed by Dr. Juanda Chance as OP. Reports history of hemorrhoids. No recent colonoscopy.  3. A-fib: rate is controlled though somewhat bradycardic. Cut back on dose of metoprolol. BP sable. On warfarin as well. Pharmacy managing.  4. Acute on Chronic Renal Failure: Stable.  5. H/o GERD: PPI.  6. H/o CHF Unknown type.: No ECHo reports available. Patient is compensated. No need to obtain ECHo as inpatient. Can f/u with his cardiologist.  7. Anemia: ? Sec to GI loss. Hgb was 11.2 in November 2012. Await GI input.  8. Other issues stable.  9. Full Code  10. DVT Proph: On warfarin.   LOS: 1 day   [facility] Pager [phone] 06/29/2011, 10:46 AM

## 2011-06-29 NOTE — Progress Notes (Signed)
ANTIBIOTIC/ ANTICOAGULATION CONSULT NOTE - Follow Up Consult  Pharmacy Consult for Levaquin, Vancomycin; Warfarin Indication: LE Cellulitis; Atrial Fibrillation  No Known Allergies  Patient Measurements: Height: 5\' 8"  (172.7 cm) Weight: 172 lb 6.4 oz (78.2 kg) IBW/kg (Calculated) : 68.4    Vital Signs: Temp: 98.2 F (36.8 C) (01/04 0459) Temp src: Oral (01/04 0459) BP: 119/69 mmHg (01/04 0459) Pulse Rate: 51  (01/04 0459)  Labs:  Basename 06/29/11 0500 06/28/11 1600  HGB 8.8* 8.8*  HCT 27.0* 27.7*  PLT 152 156  APTT -- --  LABPROT 29.4* 27.2*  INR 2.73* 2.47*  HEPARINUNFRC -- --  CREATININE 1.59* 1.66*  CKTOTAL -- --  CKMB -- --  TROPONINI -- --   Estimated Creatinine Clearance: 33.5 ml/min (by C-G formula based on Cr of 1.59).   Medications:  Scheduled:    . aspirin EC  81 mg Oral Daily  . doxazosin  2 mg Oral QHS  . dutasteride  0.5 mg Oral Daily  . ferrous sulfate  325 mg Oral QPC breakfast  . furosemide  20 mg Oral Daily  . influenza  inactive virus vaccine  0.5 mL Intramuscular Tomorrow-1000  . levofloxacin (LEVAQUIN) IV  750 mg Intravenous Q48H  . metoprolol tartrate  25 mg Oral BID  . mupirocin cream   Topical BID  . pantoprazole  40 mg Oral Q1200  . potassium chloride  10 mEq Oral Daily  . sodium chloride  3 mL Intravenous Q12H  . vancomycin  1,000 mg Intravenous Once  . vancomycin  1,000 mg Intravenous Q24H  . warfarin  2.5 mg Oral Custom  . warfarin  5 mg Oral ONCE-1800  . warfarin  5 mg Oral Custom  . DISCONTD: vancomycin  1,000 mg Intravenous Once  . DISCONTD: warfarin  2.5-5 mg Oral QHS    Patient's usual warfarin dosage is reportedly 5mg  daily except 2.5mg  on Fridays.   Infusions:   PRN: sodium chloride, acetaminophen, acetaminophen, HYDROcodone-acetaminophen, senna-docusate, sodium chloride   Goal of Therapy:  -Vancomycin trough 10-15 -INR 2-3  Assessment: -78 y/o M on empiric Levaquin / Vancomycin for LE cellulitis, continuing  on warfarin for atrial fibrillation. -SCr stable. -INR therapeutic.    Plan:  -Reduce Vancomycin to 750mg  IV q24h to match targeted trough levels. Follow SCr.  Trough at steady-state. -Continue present Levaquin dosage (750mg  IV q48h). -Warfarin 2.5mg  PO today as per patient's usual regimen. Potential noted for change in warfarin requirements while on Levaquin; follow INR daily while inpatient.  Elie Goody, Pharm.D.  [phone] 06/29/2011 8:06 AM

## 2011-06-29 NOTE — Consult Note (Signed)
[facility] Gastroenterology Consultation Note  Referring Provider: Dr. Osvaldo Shipper  Reason for Consultation:  Anemia, hemoccult-positive stool  HPI: Eduardo Deleon is a 78 y.o. male admitted with recurrent weeping edema of lower extremities.  Was asked to see him for anemia with hemoccult-positive stool.  No abdominal pain and no overt bleeding.  Endorses history of colonoscopy > 20 years ago per report, with few intermixed sigmoidoscopies, no obvious findings per patient.  Has seen Dr. Juanda Chance in past, > 10 years ago last appointment per patient.  No nausea, vomiting, dysphagia, loss-of-appetite, unintentional weight loss.  No change in bowel habits.  There is no known family history of colon cancer or polyps.   Past Medical History  Diagnosis Date  . 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 Campath-induced atrial fibrillation   . Congestive heart failure   . Anemia   . Arthritis     oa    Past Surgical History  Procedure Date  . Joint replacement bilaterial knees  . Lumbar laminectomy Per patient  heriated disc in mid spine  . Hernia repair     [phone], right - 1985    Prior to Admission medications   Medication Sig Start Date End Date Taking? Authorizing Provider  aspirin EC 81 MG tablet Take 81 mg by mouth daily.     Yes Historical Provider, MD  cholecalciferol (VITAMIN D) 1000 UNITS tablet Take 2,000 Units by mouth daily.     Yes Historical Provider, MD  doxazosin (CARDURA) 2 MG tablet Take 2 mg by mouth at bedtime.     Yes Historical Provider, MD  dutasteride (AVODART) 0.5 MG capsule Take 0.5 mg by mouth daily.     Yes Historical Provider, MD  furosemide (LASIX) 20 MG tablet Take 20 mg by mouth daily.     Yes Historical Provider, MD  metoprolol tartrate (LOPRESSOR) 25 MG tablet Take 25 mg by mouth 2 (two) times daily.     Yes Historical Provider, MD  omeprazole (PRILOSEC) 20 MG capsule Take 1 capsule (20 mg total) by mouth 2 (two) times daily. 05/21/11  Yes Simbiso Ranga  potassium  chloride (KLOR-CON) 10 MEQ CR tablet Take 10 mEq by mouth daily.    Yes Historical Provider, MD  warfarin (COUMADIN) 5 MG tablet Take 2.5-5 mg by mouth at bedtime. 1 tablet daily except on Fridays and Mondays patient takes 0.5 tablet   Yes Historical Provider, MD  ferrous sulfate 325 (65 FE) MG tablet Take 1 tablet (325 mg total) by mouth 2 (two) times daily with a meal. 05/21/11 05/20/12  Simbiso Ranga    Current Facility-Administered Medications  Medication Dose Route Frequency Provider Last Rate Last Dose  . 0.9 %  sodium chloride infusion  250 mL Intravenous PRN Clanford Cyndie Mull, MD      . acetaminophen (TYLENOL) tablet 650 mg  650 mg Oral Q6H PRN Clanford Cyndie Mull, MD       Or  . acetaminophen (TYLENOL) suppository 650 mg  650 mg Rectal Q6H PRN Clanford Cyndie Mull, MD      . aspirin EC tablet 81 mg  81 mg Oral Daily Clanford Cyndie Mull, MD   81 mg at 06/29/11 0956  . doxazosin (CARDURA) tablet 2 mg  2 mg Oral QHS Clanford L Johnson, MD      . dutasteride (AVODART) capsule 0.5 mg  0.5 mg Oral Daily Clanford Cyndie Mull, MD   0.5 mg at 06/29/11 0956  . ferrous sulfate tablet 325 mg  325  mg Oral QPC breakfast Clanford Cyndie Mull, MD   325 mg at 06/29/11 0955  . furosemide (LASIX) injection 40 mg  40 mg Intravenous Daily Gokul Krishnan   40 mg at 06/29/11 1212  . HYDROcodone-acetaminophen (NORCO) 5-325 MG per tablet 1 tablet  1 tablet Oral Q6H PRN Clanford L Johnson, MD      . influenza  inactive virus vaccine (FLUZONE/FLUARIX) injection 0.5 mL  0.5 mL Intramuscular Tomorrow-1000 Hurman Horn, MD   0.5 mL at 06/29/11 0953  . Levofloxacin (LEVAQUIN) IVPB 750 mg  750 mg Intravenous Q48H Thuyvan Thi Phan, PHARMD   750 mg at 06/28/11 2343  . metoprolol tartrate (LOPRESSOR) tablet 12.5 mg  12.5 mg Oral Daily Gokul Krishnan      . mupirocin cream (BACTROBAN) 2 %   Topical BID Clanford L Johnson, MD      . pantoprazole (PROTONIX) EC tablet 40 mg  40 mg Oral Q1200 Clanford Cyndie Mull, MD   40 mg at  06/29/11 1212  . potassium chloride SA (K-DUR,KLOR-CON) CR tablet 20 mEq  20 mEq Oral Daily Gokul Krishnan      . senna-docusate (Senokot-S) tablet 1 tablet  1 tablet Oral QHS PRN Clanford L Johnson, MD      . sodium chloride 0.9 % injection 3 mL  3 mL Intravenous Q12H Clanford Cyndie Mull, MD   3 mL at 06/29/11 0956  . sodium chloride 0.9 % injection 3 mL  3 mL Intravenous PRN Clanford L Johnson, MD      . vancomycin (VANCOCIN) 750 mg in sodium chloride 0.9 % 150 mL IVPB  750 mg Intravenous Q24H Randall K Absher, PHARMD      . vancomycin (VANCOCIN) IVPB 1000 mg/200 mL premix  1,000 mg Intravenous Once Gokul Krishnan   1,000 mg at 06/28/11 1851  . warfarin (COUMADIN) tablet 2.5 mg  2.5 mg Oral ONCE-1800 Randall K Absher, PHARMD      . DISCONTD: furosemide (LASIX) tablet 20 mg  20 mg Oral Daily Clanford Cyndie Mull, MD   20 mg at 06/29/11 0955  . DISCONTD: metoprolol tartrate (LOPRESSOR) tablet 25 mg  25 mg Oral BID Clanford Cyndie Mull, MD   25 mg at 06/29/11 0956  . DISCONTD: potassium chloride (K-DUR,KLOR-CON) CR tablet 10 mEq  10 mEq Oral Daily Clanford Cyndie Mull, MD   10 mEq at 06/29/11 0955  . DISCONTD: vancomycin (VANCOCIN) injection 1,000 mg  1,000 mg Intravenous Once Dione Booze, MD      . DISCONTD: vancomycin (VANCOCIN) IVPB 1000 mg/200 mL premix  1,000 mg Intravenous Q24H Thuyvan Stacey Drain, PHARMD      . DISCONTD: warfarin (COUMADIN) tablet 2.5 mg  2.5 mg Oral Custom Lorenza Evangelist, PHARMD      . DISCONTD: warfarin (COUMADIN) tablet 2.5-5 mg  2.5-5 mg Oral QHS Clanford Cyndie Mull, MD      . DISCONTD: warfarin (COUMADIN) tablet 5 mg  5 mg Oral ONCE-1800 Thuyvan Thi Marshall Cork, PHARMD      . DISCONTD: warfarin (COUMADIN) tablet 5 mg  5 mg Oral Custom Lorenza Evangelist, PHARMD        Allergies as of 06/28/2011  . (No Known Allergies)    History reviewed. No pertinent family history.  History   Social History  . Marital Status: Married    Spouse Name: N/A    Number of Children: N/A  . Years  of Education: N/A   Occupational History  . Not on file.   Social History Main  Topics  . Smoking status: Never Smoker   . Smokeless tobacco: Never Used  . Alcohol Use: No  . Drug Use: No  . Sexually Active:    Other Topics Concern  . Not on file   Social History Narrative  . No narrative on file    Review of Systems: Positive for: anorexia, fatigue, weakness, malaise, unhealing ulcers,  joint pain or swelling Gen: Denies any fever, chills, rigors, night sweats, , involuntary weight loss, and sleep disorder CV: Denies chest pain, angina, palpitations, syncope, orthopnea, PND, peripheral edema, and claudication. Resp: Denies dyspnea, cough, sputum, wheezing, coughing up blood. GI: Described in detail in HPI.    GU : Denies urinary burning, blood in urine, urinary frequency, urinary hesitancy, nocturnal urination, and urinary incontinence. MS: Denies.  Denies muscle weakness, cramps, atrophy.  Derm: Denies rash, itching, oral ulcerations, hives,   Psych: Denies depression, anxiety, memory loss, suicidal ideation, hallucinations,  and confusion. Heme: Denies bruising, bleeding, and enlarged lymph nodes. Neuro:  Denies any headaches, dizziness, paresthesias. Endo:  Denies any problems with DM, thyroid, adrenal function.  Physical Exam: Vital signs in last 24 hours: Temp:  [97.9 F (36.6 C)-98.2 F (36.8 C)] 98.2 F (36.8 C) (01/04 1353) Pulse Rate:  [41-51] 41  (01/04 1353) Resp:  [18-20] 20  (01/04 1353) BP: (91-119)/(46-69) 91/51 mmHg (01/04 1353) SpO2:  [95 %-98 %] 95 % (01/04 1353) Weight:  [78.2 kg (172 lb 6.4 oz)] 172 lb 6.4 oz (78.2 kg) (01/03 2157) Last BM Date: 06/28/11 General:   Alert,  Chronically ill-appearing, is in no acute distress. Head:  Normocephalic and atraumatic. Eyes:  Sclera clear, no icterus.   Conjunctiva pink. Ears:  Normal auditory acuity. Nose:  No deformity, discharge,  or lesions. Mouth:  No deformity or lesions.  Oropharynx pink &  moist. Neck:  Supple; no masses or thyromegaly. Lungs:  Clear throughout to auscultation.   No wheezes, crackles, or rhonchi. No acute distress. Heart:  Regular rate and rhythm; no murmurs, clicks, rubs,  or gallops. Abdomen:  Soft, nontender and nondistended. No masses, hepatosplenomegaly or hernias noted. Normal bowel sounds, without guarding, and without rebound.     Msk:  Symmetrical without gross deformities. Normal posture. Pulses:  Normal pulses noted. Extremities:  Bilateral lower extremities in wrapping. Neurologic:  Alert and  oriented x4;  Neck contortion to left Skin:  Weeping non-healing lesions bilateral lower extremities (wrapped). Cervical Nodes:  No significant cervical adenopathy. Psych:  Alert and cooperative. Normal mood and affect.   Lab Results:  Basename 06/29/11 0500 06/28/11 1600  WBC 3.6* 4.1  HGB 8.8* 8.8*  HCT 27.0* 27.7*  PLT 152 156   BMET  Basename 06/29/11 0500 06/28/11 1600  NA 141 142  K 4.2 3.8  CL 108 106  CO2 26 26  GLUCOSE 97 94  BUN 28* 30*  CREATININE 1.59* 1.66*  CALCIUM 8.6 8.9   LFT  Basename 06/29/11 0500  PROT 5.6*  ALBUMIN 2.7*  AST 17  ALT 14  ALKPHOS 62  BILITOT 0.3  BILIDIR --  IBILI --   PT/INR  Basename 06/29/11 0500 06/28/11 1600  LABPROT 29.4* 27.2*  INR 2.73* 2.47*    Studies/Results: No results found.  Impression:  1.  Anemia, normocytic, with hemoccult-positive stool. 2.  Weeping non-healing lower extremity lesions. 3.  Atrial fibrillation, on warfarin.  Plan:  1.  I have discussed endoscopy and colonoscopy with patient. 2.  At this time, his non-healing weeping lower extremity lesions seem  to be the most pressing issue for patient.   3.  I would suggest medical management of his lower extremity lesions (atbx, wound care, etc.) 4.  Once his lower extremity weeping lesions resolve (or at least markedly improve), we can consider colonoscopy and endoscopy at that time.  He would have to be off  warfarin as well, with INR </= 1.8 in order to consider endoscopic evaluation as well. 5.  Please call us back once patient is ready for colonoscopy, or upon discharge (whereupon we can arrange outpatient follow-up for his endoscopic procedures). 6.  Thank you for the consultation.  Will sign off for now (see #5 above).   LOS: 1 day   Boniface Goffe M  06/29/2011, 2:49 PM

## 2011-06-29 NOTE — Progress Notes (Signed)
UR COMPLETED.QUALIFES FOR INDIGENT FUNDS. FROM HOME.

## 2011-06-29 NOTE — Progress Notes (Signed)
Occupational Therapy Evaluation Patient Details Name: Eduardo Deleon MRN: [id number] DOB: 1924/09/09 Today's Date: 06/29/2011 EV2 [id number] Problem List:  Patient Active Problem List  Diagnoses  . Cellulitis of left leg  . A-fib  . GERD (gastroesophageal reflux disease)  . BPH (benign prostatic hyperplasia)  . CHF (congestive heart failure)  . Anemia  . Lower extremity venous stasis  . Venous stasis ulcer  . Cellulitis of right lower extremity  . Bradycardia  . Torticollis  . CRI (chronic renal insufficiency)  . HX: long term anticoagulant use  . Heme positive stool    Past Medical History:  Past Medical History  Diagnosis Date  . 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 Campath-induced atrial fibrillation   . Congestive heart failure   . Anemia   . Arthritis     oa   Past Surgical History:  Past Surgical History  Procedure Date  . Joint replacement bilaterial knees  . Lumbar laminectomy Per patient  heriated disc in mid spine  . Hernia repair     [phone], right - 1985    OT Assessment/Plan/Recommendation OT Assessment Clinical Impression Statement: Pt appears to be close to functional baseline and will likely not need f/u OT at home. Pt is demonstrating a decline from baseline. Skilled OT recommended to improve BADL performance back to baseline for safe d/c home. OT Recommendation/Assessment: Patient will need skilled OT in the acute care venue OT Problem List: Decreased activity tolerance;Decreased safety awareness;Decreased knowledge of use of DME or AE;Increased edema;Decreased range of motion;Impaired tone OT Therapy Diagnosis : Generalized weakness OT Plan OT Frequency: Min 2X/week OT Treatment/Interventions: Self-care/ADL training;DME and/or AE instruction;Therapeutic activities;Patient/family education OT Recommendation Follow Up Recommendations: None Equipment Recommended: None recommended by OT Individuals Consulted- Recommend SW consult. Per pt's family  member, pt is a Chartered loss adjuster, house is cluttered & is unsafe for the pt and his wife. Consulted and Agree with Results and Recommendations: Patient OT Goals Acute Rehab OT Goals OT Goal Formulation: With patient Time For Goal Achievement: 2 weeks ADL Goals Pt Will Perform Grooming: with modified independence;Standing at sink (X 3 tasks to improve standing activity tolerancer.) ADL Goal: Grooming - Progress: Progressing toward goals Pt Will Perform Lower Body Bathing: with modified independence;with adaptive equipment;Sit to stand from chair;Sit to stand from bed ADL Goal: Lower Body Bathing - Progress: Not met Pt Will Perform Lower Body Dressing: with modified independence;Sit to stand from chair;Sit to stand from bed ADL Goal: Lower Body Dressing - Progress: Not met Pt Will Transfer to Toilet: with modified independence;Ambulation;with DME;Regular height toilet ADL Goal: Toilet Transfer - Progress: Progressing toward goals Pt Will Perform Toileting - Clothing Manipulation: with modified independence;Standing ADL Goal: Toileting - Clothing Manipulation - Progress: Progressing toward goals Pt Will Perform Toileting - Hygiene: with modified independence;Sit to stand from 3-in-1/toilet ADL Goal: Toileting - Hygiene - Progress: Progressing toward goals  OT Evaluation Precautions/Restrictions  Precautions Precautions: Fall Required Braces or Orthoses: No Restrictions Weight Bearing Restrictions: No Prior Functioning Home Living Lives With: Spouse (wife not in good health.) Receives Help From: Other (Comment) (Wife has HH aide.) Type of Home: House Home Layout: One level Home Access: Stairs to enter Entrance Stairs-Rails: None Entrance Stairs-Number of Steps: 2 Bathroom Shower/Tub: Other (comment);Tub/shower unit (Pt only spongebathes.) Bathroom Toilet: Handicapped height Bathroom Accessibility: No Home Adaptive Equipment: Walker - four wheeled;Straight cane;Bedside commode/3-in-1 Prior  Function Level of Independence: Independent with basic ADLs;Requires assistive device for independence;Needs assistance with homemaking Meal Prep: Minimal Light Housekeeping:  Total Driving: No ADL ADL Grooming: Performed;Wash/dry hands;Supervision/safety Where Assessed - Grooming: Standing at sink Upper Body Bathing: Simulated;Set up Where Assessed - Upper Body Bathing: Sitting, bed;Unsupported Lower Body Bathing: Simulated;Minimal assistance Where Assessed - Lower Body Bathing: Sit to stand from bed Upper Body Dressing: Simulated;Set up Where Assessed - Upper Body Dressing: Unsupported;Sitting, chair Lower Body Dressing: Simulated;Minimal assistance Where Assessed - Lower Body Dressing: Sit to stand from bed Toilet Transfer: Performed;Supervision/safety Toilet Transfer Method: Ambulating Toilet Transfer Equipment: Regular height toilet Toileting - Clothing Manipulation: Performed;Supervision/safety Where Assessed - Toileting Clothing Manipulation: Sit to stand from 3-in-1 or toilet Toileting - Hygiene: Supervision/safety Where Assessed - Toileting Hygiene: Sit to stand from 3-in-1 or toilet Tub/Shower Transfer: Not assessed Tub/Shower Transfer Method: Not assessed ADL Comments: Pt appears close to baseline with regards to ADLs. No f/u OT likely. TBD based on progress. Vision/Perception  Vision - History Baseline Vision: Wears glasses only for reading Patient Visual Report: No change from baseline Vision - Assessment Eye Alignment: Within Functional Limits Vision Assessment: Vision not tested Cognition Cognition Arousal/Alertness: Awake/alert Overall Cognitive Status: Appears within functional limits for tasks assessed Orientation Level: Oriented X4 Sensation/Coordination   Pt w/severe torticollis. Fixed forward, lateral flexion of the head to the L Extremity Assessment RUE Assessment RUE Assessment: Within Functional Limits LUE Assessment LUE Assessment: Within  Functional Limits Mobility  Bed Mobility Bed Mobility: Yes Supine to Sit: 6: Modified independent (Device/Increase time);With rails;HOB elevated (Comment degrees) Transfers Transfers: Yes Sit to Stand: 5: Supervision;From bed;From toilet Stand to Sit: 5: Supervision;To bed;To toilet Stand to Sit Details: Pt states he only uses RW when out in the community. Exercises   End of Session OT - End of Session Activity Tolerance: Patient tolerated treatment well Patient left: in bed;with call bell in reach;with family/visitor present General Behavior During Session: [facility] for tasks performed Cognition: [facility] for tasks performed   Alvaretta Eisenberger [phone] 06/29/2011, 3:48 PM

## 2011-06-30 LAB — BASIC METABOLIC PANEL
GFR calc Af Amer: 38 mL/min — ABNORMAL LOW (ref >90)
GFR calc non Af Amer: 33 mL/min — ABNORMAL LOW (ref >90)
Potassium: 3.9 mEq/L (ref 3.5–5.1)
Sodium: 137 mEq/L (ref 135–145)

## 2011-06-30 LAB — PROTIME-INR
INR: 2.28 — ABNORMAL HIGH (ref 0.00–1.49)
Prothrombin Time: 25.5 seconds — ABNORMAL HIGH (ref 11.6–15.2)

## 2011-06-30 LAB — CBC
MCHC: 31.3 g/dL (ref 30.0–36.0)
Platelets: 155 10*3/uL (ref 150–400)
RDW: 20.3 % — ABNORMAL HIGH (ref 11.5–15.5)
WBC: 4.1 10*3/uL (ref 4.0–10.5)

## 2011-06-30 MED ORDER — INFLUENZA VIRUS VACC SPLIT PF IM SUSP: 0.5000 mL | INTRAMUSCULAR | Status: DC

## 2011-06-30 MED ORDER — WARFARIN SODIUM 5 MG PO TABS
5.0000 mg | ORAL_TABLET | Freq: Once | ORAL | Status: AC
Start: 2011-06-30 — End: 2011-06-30
  Administered 2011-06-30: 5 mg via ORAL
  Filled 2011-06-30: qty 1

## 2011-06-30 MED ORDER — SODIUM CHLORIDE 0.9 % IV SOLN: 250.0000 mL | INTRAVENOUS | Status: DC | PRN

## 2011-06-30 NOTE — Progress Notes (Signed)
ANTICOAGULATION CONSULT NOTE - Follow Up Consult  Pharmacy Consult for Warfarin Indication: Atrial Fibrillation  No Known Allergies  Patient Measurements: Height: 5\' 8"  (172.7 cm) Weight: 172 lb 6.4 oz (78.2 kg) IBW/kg (Calculated) : 68.4    Vital Signs: Temp: 97.6 F (36.4 C) (01/05 0603) Temp src: Oral (01/05 0603) BP: 109/56 mmHg (01/05 0603) Pulse Rate: 42  (01/05 0603)  Labs:  Basename 06/30/11 0635 06/29/11 0500 06/28/11 1600  HGB 9.3* 8.8* --  HCT 29.7* 27.0* 27.7*  PLT 155 152 156  APTT -- -- --  LABPROT 25.5* 29.4* 27.2*  INR 2.28* 2.73* 2.47*  HEPARINUNFRC -- -- --  CREATININE -- 1.59* 1.66*  CKTOTAL -- -- --  CKMB -- -- --  TROPONINI -- -- --   Estimated Creatinine Clearance: 33.5 ml/min (by C-G formula based on Cr of 1.59).   Medications:  Scheduled:    . aspirin EC  81 mg Oral Daily  . doxazosin  2 mg Oral QHS  . dutasteride  0.5 mg Oral Daily  . ferrous sulfate  325 mg Oral QPC breakfast  . furosemide  40 mg Intravenous Daily  . influenza  inactive virus vaccine  0.5 mL Intramuscular Tomorrow-1000  . levofloxacin (LEVAQUIN) IV  750 mg Intravenous Q48H  . mupirocin cream   Topical BID  . pantoprazole  40 mg Oral Q1200  . potassium chloride  20 mEq Oral Daily  . sodium chloride  3 mL Intravenous Q12H  . vancomycin  750 mg Intravenous Q24H  . warfarin  2.5 mg Oral ONCE-1800  . DISCONTD: furosemide  20 mg Oral Daily  . DISCONTD: metoprolol tartrate  12.5 mg Oral Daily  . DISCONTD: metoprolol tartrate  25 mg Oral BID  . DISCONTD: potassium chloride  Eduardo mEq Oral Daily   Infusions:   PRN: sodium chloride, acetaminophen, acetaminophen, HYDROcodone-acetaminophen, senna-docusate, sodium chloride  Assessment:  78 yo Deleon continuing coumadin for A.fib  Home dose = 5 mg daily except 2.5 mg on mondays  INR therapeutic on Home regimen, although trended down from yesterday. Will monitor.   Anemia with heme + stool noted.  No overt bleeding, will  continue with coumadin dosing now as colonoscopy will be scheduled after resolution/improvement of LLE cellulitis/wounds.   CBC stable today  Potential DI with levaquin started 1/3  Goal INR 2-3  Plan:  1.) Continue with home dosing - Coumadin 5 mg x 1 today 2.) f/u with AM PT/INR  Sebrina Kessner, Loma Messing PharmD 8:35 AM 06/30/2011

## 2011-06-30 NOTE — Progress Notes (Signed)
PCP: Maryelizabeth Rowan, MD, MD  Brief HPI:  Eduardo Deleon is an 78 y.o. male with a long history of chronic venous stasis dermatitis and edema of both lower extremities with peripheral vascular disease who presents to the emergency department today because he became concerned about noticing increasing redness in the right leg above the knee cap. He has recently completed a course of antibiotics. He was seen in the emergency department in November of 2012 for the left leg. He has been receiving wound care at home. He develops extensive bullous lesions on the lower extremities that have become fluid-filled and require incision and drainage. He has developed two large bullous lesions on the left lower extremity in addition to redness and drainage in some areas. He denies fever and chills. He does report that he experienced pain in the left leg the night before admission. He has atrial fibrillation and taking Coumadin and his INR is currently therapeutic. Hospital admission was requested for treatment of his presumed cellulitis of the right lower extremity and questionable cellulitis of the left lower extremity.   Past medical history:   Past Medical History   Diagnosis  Date   .  Stroke  1978 lower brain stem   .  Campath-induced atrial fibrillation    .  Congestive heart failure    .  Anemia    .  Arthritis      oa   chronic venous stasis bilateral lower extremities  Current  Torticollis of neck  Cerebrovascular disease  Peripheral vascular disease  Consultants: [facility]  Procedures: None so far  Subjective: Patient feels well. Denies any pain. No blood in stool.   Objective: Vital signs in last 24 hours: Temp:  [97.3 F (36.3 C)-98.1 F (36.7 C)] 97.6 F (36.4 C) (01/05 0603) Pulse Rate:  [42-44] 42  (01/05 0603) Resp:  [16] 16  (01/05 0603) BP: (97-109)/(56-60) 109/56 mmHg (01/05 0603) SpO2:  [96 %-99 %] 96 % (01/05 0603) Weight change:  Last BM Date:  06/28/11  Intake/Output from previous day: 01/04 0701 - 01/05 0700 In: 240 [P.O.:240] Out: 1576 [Urine:1575; Stool:1] Intake/Output this shift: Total I/O In: 240 [P.O.:240] Out: 200 [Urine:200]  General appearance: alert, cooperative, appears stated age and no distress Head: Normocephalic, without obvious abnormality, atraumatic Resp: clear to auscultation bilaterally Cardio: regular rate and rhythm, S1, S2 normal, no murmur, click, rub or gallop GI: soft, non-tender; bowel sounds normal; no masses,  no organomegaly Extremities: Covered in dressing. Did not remove today. See yesterday's note. Pulses: poor pulses Neurologic: Grossly normal  Lab Results:  Basename 06/30/11 0635 06/29/11 0500  WBC 4.1 3.6*  HGB 9.3* 8.8*  HCT 29.7* 27.0*  PLT 155 152   BMET  Basename 06/30/11 0635 06/29/11 0500  NA 137 141  K 3.9 4.2  CL 104 108  CO2 27 26  GLUCOSE 93 97  BUN 26* 28*  CREATININE 1.80* 1.59*  CALCIUM 8.5 8.6    Studies/Results: No results found.  Medications: I have reviewed the patient's current medications.  Assessment/Plan:  Principal Problem:  *Cellulitis of right lower extremity Active Problems:  Cellulitis of left leg  A-fib  GERD (gastroesophageal reflux disease)  CHF (congestive heart failure)  Anemia  Lower extremity venous stasis  Venous stasis ulcer  Bradycardia  Torticollis  CRI (chronic renal insufficiency)  HX: long term anticoagulant use  Heme positive stool    1. Cellulitis of bilateral Lower Extremities with blisters: Continue IV antibiotics (Levaquin and Vanc). Wound care consult  done. No DVT noted. Try IV lasix to reduce edema.  2. Heme Positive Stool: Is also anemic though its normocytic. Outpatient colonoscopy per GI note. HGb stable.  3. A-fib: Rate is controlled though somewhat bradycardic. Had asymptomatic pause last night. Metoprolol discontinued. Will wait and watch for now. If HR doesn't improve will need to involve  cardiology.On warfarin as well. Pharmacy managing.  4. Acute on Chronic Renal Failure: Worsening in creatinine today. Baseline creat is about 1.2 - 1.4. Start IVF. May need to stop lasix. Recheck in AM.  5. H/o GERD: PPI.  6. H/o CHF Unknown type.: No ECHO reports available. Patient is compensated. No need to obtain ECHo as inpatient.   7. Anemia: ? Sec to GI loss. Hgb was 11.2 in November 2012. Appreciate GI input. W/u as OP.  8. Full Code  9. DVT Proph: On warfarin.  Hopefully can be discharged in 1-2 days.   LOS: 2 days   [facility] Pager [phone] 06/30/2011, 3:13 PM

## 2011-06-30 NOTE — Progress Notes (Signed)
Nursing: Patient Hr 39-41 monitor tech called secondary to patient experiencing a 2.78 sec pause, EKG showed SB 39-40.  Monitor stated that patient has experienced pauses in the past, mo documentation noted. PA on call made aware pt BP 106/60 patient asymptomatic.  No orders given by PA will continue to monitor patient closely.  PA will inform attending physician in the am.  Quenton Fetter RN

## 2011-07-01 LAB — PROTIME-INR
INR: 1.95 — ABNORMAL HIGH (ref 0.00–1.49)
Prothrombin Time: 22.6 seconds — ABNORMAL HIGH (ref 11.6–15.2)

## 2011-07-01 LAB — BASIC METABOLIC PANEL
Calcium: 8.6 mg/dL (ref 8.4–10.5)
GFR calc Af Amer: 46 mL/min — ABNORMAL LOW (ref >90)
GFR calc non Af Amer: 40 mL/min — ABNORMAL LOW (ref >90)
Glucose, Bld: 91 mg/dL (ref 70–99)
Sodium: 139 mEq/L (ref 135–145)

## 2011-07-01 LAB — TSH: TSH: 2.726 u[IU]/mL (ref 0.350–4.500)

## 2011-07-01 MED ORDER — HYDROCODONE-ACETAMINOPHEN 5-325 MG PO TABS: 1.0000 | ORAL_TABLET | Freq: Four times a day (QID) | ORAL | Status: AC | PRN

## 2011-07-01 MED ORDER — WARFARIN SODIUM 6 MG PO TABS
6.0000 mg | ORAL_TABLET | Freq: Once | ORAL | Status: DC
  Filled 2011-07-01: qty 1

## 2011-07-01 MED ORDER — LEVOFLOXACIN 500 MG PO TABS: 500.0000 mg | ORAL_TABLET | Freq: Every day | ORAL | Status: AC

## 2011-07-01 NOTE — Plan of Care (Signed)
Problem: Discharge Progression Outcomes Goal: Discharge plan in place and appropriate Outcome: Completed/Met Date Met:  07/01/11 Home with hh

## 2011-07-01 NOTE — Discharge Summary (Signed)
Physician Discharge Summary  Patient ID: Eduardo Deleon MRN: [id number] DOB/AGE: 76-Feb-1926 76 y.o.  Admit date: 06/28/2011 Discharge date: 07/01/2011  Discharge Diagnoses:  Principal Problem:  *Cellulitis of right lower extremity Active Problems:  Cellulitis of left leg  A-fib  GERD (gastroesophageal reflux disease)  CHF (congestive heart failure)  Anemia  Lower extremity venous stasis  Venous stasis ulcer  Bradycardia  Torticollis  CRI (chronic renal insufficiency)  HX: long term anticoagulant use  Heme positive stool   Discharged Condition: fair  Initial History: Eduardo Deleon is an 78 y.o. male with a long history of chronic venous stasis dermatitis and edema of both lower extremities with peripheral vascular disease who presents to the emergency department today because he became concerned about noticing increasing redness in the right leg above the knee cap. He has recently completed a course of antibiotics. He was seen in the emergency department in November of 2012 for the left leg. He has been receiving wound care at home. He develops extensive bullous lesions on the lower extremities that have become fluid-filled and require incision and drainage. He has developed two large bullous lesions on the left lower extremity in addition to redness and drainage in some areas. He denies fever and chills. He does report that he experienced pain in the left leg the night before admission. He has atrial fibrillation and taking Coumadin and his INR is currently therapeutic. Hospital admission was requested for treatment of his presumed cellulitis of the right lower extremity and questionable cellulitis of the left lower extremity.  Hospital Course:   1. Cellulitis of bilateral Lower Extremities with blisters: Patient was put on intravenous antibiotics. Wound care nurse saw the patient as well. His legs appear to have improved some. Antibiotics will be changed over to by mouth today  and he will be allowed to go home. Patient may benefit from referral to wound care center and he will like to discuss this issue with the Dr. Alanda Amass. Patient is very keen on going home today as he wants to go home and take care of his wife. No DVT noted on venous Dopplers. He was given intravenous Lasix to help with the edema , which could help the wounds. Home health care for his wound care will be resumed.   2. Heme Positive Stool: This was detected in the hospital. This was in response to a hemoglobin that was between 8 and 9. Patient's hemoglobin is about 10-11 at baseline. Because the patient is on warfarin at home I consulted gastroenterology. They would like to do a colonoscopy when his wound care issues have stabilized. I have informed the on-call physician for [facility] today, so that they can schedule an outpatient colonoscopy.  3. A-fib: Rate is controlled though somewhat bradycardic. Had asymptomatic pause 2 nights ago. As a result of the persistent bradycardia the metoprolol has been discontinued. His blood pressures are stable and he is asymptomatic. He will need to followup with Dr. Alanda Amass for the same. Was also maintained on warfarin during this hospitalization. INR will be followed by Dr. Alanda Amass as before. Patient will call his office tomorrow to schedule an appointment.His INR today is 1.95.   4. Acute on Chronic Renal Failure: Baseline creat is about 1.2 - 1.4. Over the last couple of months his creatinine has been about 1.7- 2.2. We gave him some IV fluids. He was also given intravenous Lasix because of the mild edema in his lower extremities. His creatinine has come down today  to 1.54. He can have followup labs when he sees Dr. Alanda Amass in his clinic.    5. normocytic Anemia: This could be secondary to GI loss. Please see above for discussion. He did not require blood transfusions. Outpatient colonoscopy will be scheduled by the gastroenterologist. His warfarin will need to be  stopped before he can have a colonoscopy  Overall patient is stable. His other medical issues are stable. Since he is requesting to go home today to take care of his wife I feel comfortable in discharging him. Even though he is bradycardic he is completely stable. He will need to followup with his cardiologist.  He tells me that his cardiologist is the one who has been actively taking care of his lower extremity wounds, and I have explained to the patient that the patient may require referral to a wound care Center, which the patient tells me his cardiologist has arranged in the past and should, be able to do so in the future. He will call his cardiologist tomorrow to schedule an appointment   Per PT recommendations Home Health has been arranged. Home health wound care will be resumed as well.   IMAGING STUDIES Ct Head Wo Contrast  06/17/2011  *RADIOLOGY REPORT*  Clinical Data: Altered mental status.  Confusion.  CT HEAD WITHOUT CONTRAST  Technique:  Contiguous axial images were obtained from the base of the skull through the vertex without contrast.  Comparison: None.  Findings: Positioning is nonstandard as the patient cannot straighten his head.  No evidence of acute abnormality including acute infarction, hemorrhage, mass lesion, mass effect, midline shift or abnormal extra-axial fluid collection is identified.  No fracture. No hydrocephalus is identified.  IMPRESSION: No acute finding.  Original Report Authenticated By: Bernadene Bell. Maricela Curet, M.D.    Discharge Exam: Blood pressure 113/60, pulse 60, temperature 98.9 F (37.2 C), temperature source Oral, resp. rate 20, height 5\' 8"  (1.727 m), weight 78.2 kg (172 lb 6.4 oz), SpO2 95.00%. General appearance: alert, cooperative, appears stated age and no distress Head: Normocephalic, without obvious abnormality, atraumatic Resp: clear to auscultation bilaterally Cardio: regular rate and rhythm, S1, S2 normal, no murmur, click, rub or gallop GI:  soft, non-tender; bowel sounds normal; no masses,  no organomegaly Extremities: legs covered in dressing Pulses: poor pulses Skin: mild erythema lower legs. no warmth Neurologic: Grossly normal Legs are still with wounds draining clear liquid but blisters are better.  Disposition: Home or Self Care  Discharge Orders    Future Orders Please Complete By Expires   Diet - low sodium heart healthy      Increase activity slowly      Discharge instructions      Comments:   Wound care at home as before.     Current Discharge Medication List    START taking these medications   Details  HYDROcodone-acetaminophen (NORCO) 5-325 MG per tablet Take 1 tablet by mouth every 6 (six) hours as needed. Qty: 30 tablet, Refills: 0    levofloxacin (LEVAQUIN) 500 MG tablet Take 1 tablet (500 mg total) by mouth daily. Qty: 11 tablet, Refills: 0      CONTINUE these medications which have NOT CHANGED   Details  cholecalciferol (VITAMIN D) 1000 UNITS tablet Take 2,000 Units by mouth daily.      doxazosin (CARDURA) 2 MG tablet Take 2 mg by mouth at bedtime.      dutasteride (AVODART) 0.5 MG capsule Take 0.5 mg by mouth daily.      furosemide (  LASIX) 20 MG tablet Take 20 mg by mouth daily.      omeprazole (PRILOSEC) 20 MG capsule Take 1 capsule (20 mg total) by mouth 2 (two) times daily. Qty: 30 capsule, Refills: 0    potassium chloride (KLOR-CON) 10 MEQ CR tablet Take 10 mEq by mouth daily.     warfarin (COUMADIN) 5 MG tablet Take 2.5-5 mg by mouth at bedtime. 1 tablet daily except on Fridays and Mondays patient takes 0.5 tablet    ferrous sulfate 325 (65 FE) MG tablet Take 1 tablet (325 mg total) by mouth 2 (two) times daily with a meal. Qty: 30 tablet, Refills: 0      STOP taking these medications     aspirin EC 81 MG tablet      metoprolol tartrate (LOPRESSOR) 25 MG tablet         Follow-up Information    Follow up with Susa Griffins A, MD. Call in 1 day. (post hospitalization  follow up. To check Blood work [employer])).)    Contact information:   [address]  [place] [address] [phone]       Follow up with Freddy Jaksch, MD. (His office will call to schedule colonoscopy.)    Contact information:   [address], [address] [employer], [place]. [place] [address] [phone]          Total Discharge Time: 40 mins  [facility]  [facility] Pager [phone]  07/01/2011, 11:35 AM

## 2011-07-01 NOTE — Progress Notes (Signed)
ANTICOAGULATION CONSULT NOTE - Follow Up Consult  Pharmacy Consult for Warfarin Indication: Atrial Fibrillation  No Known Allergies  Patient Measurements: Height: 5\' 8"  (172.7 cm) Weight: 172 lb 6.4 oz (78.2 kg) IBW/kg (Calculated) : 68.4    Vital Signs: Temp: 98.9 F (37.2 C) (01/06 0600) Temp src: Oral (01/06 0600) BP: 113/60 mmHg (01/06 0600) Pulse Rate: 60  (01/06 0600)  Labs:  Basename 07/01/11 0550 06/30/11 0635 06/29/11 0500 06/28/11 1600  HGB -- 9.3* 8.8* --  HCT -- 29.7* 27.0* 27.7*  PLT -- 155 152 156  APTT -- -- -- --  LABPROT 22.6* 25.5* 29.4* --  INR 1.95* 2.28* 2.73* --  HEPARINUNFRC -- -- -- --  CREATININE 1.54* 1.80* 1.59* --  CKTOTAL -- -- -- --  CKMB -- -- -- --  TROPONINI -- -- -- --   Estimated Creatinine Clearance: 34.5 ml/min (by C-G formula based on Cr of 1.54).   Medications:  Scheduled:     . aspirin EC  81 mg Oral Daily  . doxazosin  2 mg Oral QHS  . dutasteride  0.5 mg Oral Daily  . ferrous sulfate  325 mg Oral QPC breakfast  . furosemide  40 mg Intravenous Daily  . levofloxacin (LEVAQUIN) IV  750 mg Intravenous Q48H  . mupirocin cream   Topical BID  . pantoprazole  40 mg Oral Q1200  . potassium chloride  20 mEq Oral Daily  . sodium chloride  3 mL Intravenous Q12H  . vancomycin  750 mg Intravenous Q24H  . warfarin  5 mg Oral ONCE-1800  . DISCONTD: influenza  inactive virus vaccine  0.5 mL Intramuscular Tomorrow-1000   Infusions:   PRN: sodium chloride, acetaminophen, acetaminophen, HYDROcodone-acetaminophen, senna-docusate, sodium chloride, DISCONTD: sodium chloride  Assessment:  78 yo M continuing coumadin for A.fib  Home dose = 5 mg daily except 2.5 mg on mondays  INR dow slightly subtherapeutic on Home regimen x 1 day  Anemia with heme + stool on 1/3 noted.  No overt bleeding, will continue with coumadin dosing now as colonoscopy will be scheduled as outpatient after resolution/improvement of LLE cellulitis/wounds.    CBC improved today  Potential DI with levaquin started 1/3  Goal INR 2-3  Plan:  1.) Increase coumadin slightly today - 6mg  2.) f/u with AM PT/INR  Loralee Pacas R PharmD 8:51 AM 07/01/2011 Pager: [phone]

## 2011-07-01 NOTE — Progress Notes (Signed)
CM spoke with pt concerning d/c planning. Per pt previously active before coming into hospital for [facility] for wound care. Per pt choice [name] to  continue [facility] services.  Md order to add HHPT/OT. Pt's spouse to assist in home care. Pt states able to get rx, no indigent funds needed.  Leonie Green [phone]

## 2011-07-10 ENCOUNTER — Encounter (HOSPITAL_BASED_OUTPATIENT_CLINIC_OR_DEPARTMENT_OTHER): Payer: Medicare Other | Attending: General Surgery

## 2011-07-10 DIAGNOSIS — Z8546 Personal history of malignant neoplasm of prostate: Secondary | ICD-10-CM | POA: Insufficient documentation

## 2011-07-10 DIAGNOSIS — Z79899 Other long term (current) drug therapy: Secondary | ICD-10-CM | POA: Insufficient documentation

## 2011-07-10 DIAGNOSIS — L97809 Non-pressure chronic ulcer of other part of unspecified lower leg with unspecified severity: Secondary | ICD-10-CM | POA: Insufficient documentation

## 2011-07-10 DIAGNOSIS — Z96659 Presence of unspecified artificial knee joint: Secondary | ICD-10-CM | POA: Insufficient documentation

## 2011-07-10 DIAGNOSIS — I739 Peripheral vascular disease, unspecified: Secondary | ICD-10-CM | POA: Insufficient documentation

## 2011-07-10 DIAGNOSIS — K219 Gastro-esophageal reflux disease without esophagitis: Secondary | ICD-10-CM | POA: Insufficient documentation

## 2011-07-10 DIAGNOSIS — I509 Heart failure, unspecified: Secondary | ICD-10-CM | POA: Insufficient documentation

## 2011-07-10 DIAGNOSIS — I4891 Unspecified atrial fibrillation: Secondary | ICD-10-CM | POA: Insufficient documentation

## 2011-07-10 DIAGNOSIS — I872 Venous insufficiency (chronic) (peripheral): Secondary | ICD-10-CM | POA: Insufficient documentation

## 2011-07-10 DIAGNOSIS — Z8673 Personal history of transient ischemic attack (TIA), and cerebral infarction without residual deficits: Secondary | ICD-10-CM | POA: Insufficient documentation

## 2011-07-11 NOTE — Progress Notes (Signed)
[facility]  NAME:  Eduardo Deleon, Eduardo Deleon             ACCOUNT NO.:  [id number]  MEDICAL RECORD NO.:  [id number]      DATE OF BIRTH:  1924-12-20  PHYSICIAN:  Joanne Gavel, M.D.         VISIT DATE:  07/10/2011                                  OFFICE VISIT   CHIEF COMPLAINT:  Wound, both lower extremities.  HISTORY OF PRESENT ILLNESS:  This patient was recently admitted to the hospital with cellulitis of the right lower extremity.  He has a long history of chronic venous stasis.  He developed a sizable blister of the right leg during this hospitalization and has been treated with antibiotics.  Since hospital discharge, he has been seen by [place] with multiple dressing changes.  PAST MEDICAL HISTORY:  Significant for atrial fibrillation, GERD, congestive heart failure, anemia, CVA, peripheral vascular disease, prostate cancer, and torticollis.  PAST SURGICAL HISTORY:  Bilateral hernia repair, knee replacements, tonsillectomy, and herniated disk.  SOCIAL HISTORY:  Cigarettes none.  Alcohol, none.  ALLERGIES:  None.  MEDICATIONS:  Hydrocodone, levofloxacin, Cardura, Lasix, omeprazole, potassium, warfarin, iron, and Avodart.  REVIEW OF SYSTEMS:  Otherwise noncontributory.  PHYSICAL EXAMINATION:  VITAL SIGNS:  Temperature 97.9 pulse 70, respirations 18, blood pressure 133/87. GENERAL:  Awake, alert with torticollis.  General appearance, well developed, well nourished.  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 CHEST AND HEART:  Within normal limits. ABDOMEN:  Not examined. HEAD, EYES, EARS, NOSE, THROAT:  Essentially negative, risks of torticollis. EXTREMITIES:  Examination of the lower extremities reveals very good peripheral pulses, bilateral.  There is marked stasis changes bilaterally.  There is a 7.5 x 12.3 very superficial blister covered ulceration right anteriorly and a much smaller 1.8 x 0.5, left anterior lower extremity  wound.  IMPRESSION:  Venous stasis with ulceration.  PLAN OF TREATMENT:  Elevation, Profore Lite with silver alginate.  I will see him in 7 days.     Joanne Gavel, M.D.     RA/MEDQ  D:  07/10/2011  T:  07/10/2011  Job:  [id number]

## 2011-07-26 ENCOUNTER — Encounter: Payer: Self-pay | Admitting: Internal Medicine

## 2011-08-14 ENCOUNTER — Encounter (HOSPITAL_BASED_OUTPATIENT_CLINIC_OR_DEPARTMENT_OTHER): Payer: Medicare Other

## 2011-08-16 ENCOUNTER — Encounter: Payer: Self-pay | Admitting: *Deleted

## 2011-08-24 ENCOUNTER — Encounter: Payer: Self-pay | Admitting: Internal Medicine

## 2011-08-24 ENCOUNTER — Ambulatory Visit (INDEPENDENT_AMBULATORY_CARE_PROVIDER_SITE_OTHER): Payer: Medicare Other | Admitting: Internal Medicine

## 2011-08-24 DIAGNOSIS — R195 Other fecal abnormalities: Secondary | ICD-10-CM

## 2011-08-24 DIAGNOSIS — D509 Iron deficiency anemia, unspecified: Secondary | ICD-10-CM

## 2011-08-24 DIAGNOSIS — D689 Coagulation defect, unspecified: Secondary | ICD-10-CM

## 2011-08-24 MED ORDER — PEG-KCL-NACL-NASULF-NA ASC-C 100 G PO SOLR: 1.0000 | Freq: Once | ORAL | Status: DC

## 2011-08-24 NOTE — Patient Instructions (Addendum)
You have been scheduled for a colonoscopy with propofol. Please follow written instructions given to you at your visit today.  Please pick up your prep kit at the pharmacy within the next 1-3 days. We have scheduled you for an appointment with Dr [name] on 01/29/12 @ 9:30 am. Please arrive at 9:15 am for registration. Bring a copy of your insruance cards, your copay and all of your medications including over the counter vitamins. CC: Dr Alanda Amass, Dr [name]

## 2011-08-24 NOTE — Progress Notes (Signed)
Eduardo Deleon Nov 12, 1924 MRN [id number]   History of Present Illness:  This is an 78 year old white male who is here to discuss a colonoscopy. He was found to have Hemoccult-positive stool while in the hospital in January 2013 for cellulitis of his leg. He was treated with Levaquin. He has a history of atrial fibrillation and has been on Coumadin. He has been followed by Dr.Weintraub for congestive heart failure and chronic renal insufficiency. We saw him in 1999 for an injection of symptomatic hemorrhoids. His flexible sigmoidoscopy showed diverticulosis of the left colon. His last full colonoscopy was in 1998. It showed moderate to severe diverticulosis. He had a prior hemorrhoidectomy in 1967 and 1971. He denies any abdominal pain or visible blood per rectum. There is no family history of colon cancer.    Past Medical History  Diagnosis Date  . 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 Atrial fibrillation   . Congestive heart failure   . Anemia   . Arthritis     oa  . Splenic lesion   . Fecal impaction   . Venous stasis dermatitis   . Prostate cancer   . Anal fissure     Hx of  . Internal hemorrhoids   . Diverticulosis 04/12/1998    Left colon--Flex Sig   Past Surgical History  Procedure Date  . Joint replacement bilaterial knees  . Lumbar laminectomy Per patient  heriated disc in mid spine  . Hernia repair     [phone], right - 1985  . Hemorrhoid surgery     reports that he has never smoked. He has never used smokeless tobacco. He reports that he does not drink alcohol or use illicit drugs. family history includes Breast cancer in his mother; Diabetes in his brother and unspecified family member; and Heart disease in his father.  There is no history of Colon cancer. No Known Allergies      Review of Systems:Denies dysphagia, odynophagia, change in bowel habits constipation or use of laxatives  The remainder of the 10 point ROS is negative except as outlined in  H&P   Physical Exam: General appearance  Well developed, in no distress.Severe cervical scoliosis.  Eyes- non icteric. HEENT nontraumatic, normocephalic. Mouth no lesions, tongue papillated, no cheilosis. Neck supple without adenopathy, thyroid not enlarged, no carotid bruits, no JVD. Lungs Clear to auscultation bilaterally. Cor normal S1, normal S2, regular rhythm, no murmur,  quiet precordium. Abdomen: Soft abdomen with normal active bowel sounds. Small umbilical hernia. No tenderness. Liver edge at costal margin.  Rectal:Soft Hemoccult negative stool  Extremities no pedal edema. Skin no lesions. Neurological alert and oriented x 3. Psychological normal mood and affect.  Assessment and Plan:  Problem #1 This is an 78 year old gentleman who had Hemoccult-positive stool while on Coumadin during his recent hospitalization. His last colonoscopy was in 1998. He is Hemoccult-negative today. We will proceed with a colonoscopy to rule out a colon neoplasm. He will be taken off Coumadin for 5 days prior to the colonoscopy and return back on Coumadin after the procedure.   08/24/2011 Lina Sar

## 2011-08-29 ENCOUNTER — Ambulatory Visit (INDEPENDENT_AMBULATORY_CARE_PROVIDER_SITE_OTHER): Payer: Medicare Other | Admitting: Internal Medicine

## 2011-08-29 ENCOUNTER — Encounter: Payer: Self-pay | Admitting: Internal Medicine

## 2011-08-29 DIAGNOSIS — I872 Venous insufficiency (chronic) (peripheral): Secondary | ICD-10-CM

## 2011-08-29 DIAGNOSIS — N400 Enlarged prostate without lower urinary tract symptoms: Secondary | ICD-10-CM

## 2011-08-29 DIAGNOSIS — M436 Torticollis: Secondary | ICD-10-CM

## 2011-08-29 DIAGNOSIS — I878 Other specified disorders of veins: Secondary | ICD-10-CM

## 2011-08-29 DIAGNOSIS — I4891 Unspecified atrial fibrillation: Secondary | ICD-10-CM

## 2011-08-29 NOTE — Assessment & Plan Note (Signed)
Chronic skin changes hosp 06/2011 and 04/2011 for cellulitis and ulceration reviewed - legs healed back to baseline, completed HH wound care Continue compression hose support - lasix as needed per cards

## 2011-08-29 NOTE — Assessment & Plan Note (Signed)
Follows with uro for same (grapey) - also indolent prostate ca symptoms controlled on current meds The current medical regimen is effective;  continue present plan and medications.

## 2011-08-29 NOTE — Patient Instructions (Signed)
It was good to see you today. we will send to your prior provider(s) for "release of records" as discussed today - Duanne Guess and Alanda Amass Medications reviewed, no changes at this time. Will check on possible alternative treatments for your neck spasm - no change recommended at this time Please schedule followup in 6-12 weeks for "physical" and medicare wellness review, call sooner if problems.

## 2011-08-29 NOTE — Progress Notes (Signed)
Subjective:    Patient ID: Eduardo Deleon, male    DOB: 10/28/1924, 78 y.o.   MRN: [id number]  HPI  New to me and my division, known to our GI division - here to establish PC closer to home (prev followed with Dr Duanne Guess) Reviewed chronic medical issues today:  Cervical torticollis - onset 2008, head "falls to left" chronically - unsuccessful tx by PMR in 2011 and prior eval by spine specialist (cohen) and injections in 2010 - also little help with PT  atrial fibrillation - chronic anticoag - followed with cards Alanda Amass) for same - no chest pain or palpitations  Recent hosp 06/2011 and 04/2011 (and 2011) for venous insuff with ulceration/cellulitis (L in Nov, R in Jan) - uses compression hose to control same, has completed HH wound care - no active ulcers, weeping or problems  BPH and prostate cancer -follows with uro for same; last PSA 6, no tx for cancer at this time, monitor PSA q11mo - voiding ok - meds reviewed  Past Medical History  Diagnosis Date  . 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 Atrial fibrillation     chronic anticaog - weintraub  . Congestive heart failure   . Anemia   . Osteoarthritis   . Splenic lesion   . Venous stasis dermatitis     hx venous ulcer and cellulitis 06/2011  . Prostate cancer   . Anal fissure     Hx of  . Internal hemorrhoids   . Diverticulosis 04/12/1998    Left colon--Flex Sig  . CRI (chronic renal insufficiency)   . GERD (gastroesophageal reflux disease)   . PVD (peripheral vascular disease)    Family History  Problem Relation Age of Onset  . Heart disease Father   . Breast cancer Mother   . Diabetes Brother   . Diabetes      grandmother  . Colon cancer Neg Hx    History  Substance Use Topics  . Smoking status: Never Smoker   . Smokeless tobacco: Never Used  . Alcohol Use: No   married, lives with wife. 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   Review of Systems Constitutional: Negative for  fever or weight change.  Respiratory: Negative for cough and shortness of breath.   Cardiovascular: Negative for chest pain or palpitations.  Gastrointestinal: Negative for abdominal pain, no bowel changes.  Musculoskeletal: Negative for gait problem or joint swelling.  Skin: Negative for rash.  Neurological: Negative for dizziness or headache.  No other specific complaints in a complete review of systems (except as listed in HPI above).     Objective:   Physical Exam  BP 142/82  Pulse 86  Temp(Src) 97.5 F (36.4 C) (Oral)  Ht 5\' 6"  (1.676 m)  Wt 157 lb 12.8 oz (71.578 kg)  BMI 25.47 kg/m2  SpO2 94% Wt Readings from Last 3 Encounters:  08/29/11 157 lb 12.8 oz (71.578 kg)  08/24/11 160 lb (72.576 kg)  06/28/11 172 lb 6.4 oz (78.2 kg)   Constitutional:  He appears well-developed and well-nourished. No distress.  Neck: marked torticollis with leftward lean - supports head with his hand during conversation. Diminished  Rotation and flexion/extension. No LAD or JVD present. No thyromegaly present.  Cardiovascular: Normal rate, irregular rhythm and normal heart sounds.  No murmur heard. trace BLE edema with bilateral, distal chronic venous stasis changes. Pulmonary/Chest: Effort normal and breath sounds normal. No respiratory distress. no wheezes.  Abdominal: Soft. Bowel sounds are  normal. Patient exhibits no distension. There is no tenderness.  Musculoskeletal: Neck deformity as noted above. Also kyphosis. No other gross deformities. Patient walks with 3 prong cane support Neurological: he is alert and oriented to person, place, and time. No cranial nerve deficit. Speech and cognition normal.  Skin: Chronic venous changes bilateral distal legs  No erythema or ulceration.  Psychiatric: he has a normal mood and affect. behavior is normal. Judgment and thought content normal.   Lab Results  Component Value Date   WBC 4.1 06/30/2011   HGB 9.3* 06/30/2011   HCT 29.7* 06/30/2011   PLT 155  06/30/2011   GLUCOSE 91 07/01/2011   ALT 14 06/29/2011   AST 17 06/29/2011   NA 139 07/01/2011   K 4.0 07/01/2011   CL 102 07/01/2011   CREATININE 1.54* 07/01/2011   BUN 27* 07/01/2011   CO2 27 07/01/2011   TSH 2.726 07/01/2011   INR 1.95* 07/01/2011   CT C-spine 09/2009:IMPRESSION:    1.  Severe cervical spondylosis with irregular erosions of the left C2-C3 facet joint and multiple disc spaces.  There is 5 mm of anterolisthesis of C2 on C3 resulting in mild to moderate central and biforaminal stenosis.     Assessment & Plan:  See problem list. Medications and labs reviewed today.  Time spent with pt today 45 minutes, greater than 50% time spent counseling patient on 06/2011 and 04/2011 hosp for cellulitis and venous insuff, torticollis and chronic neck symptom, BPH/prostate ca and medication review. Also review of prior records

## 2011-08-29 NOTE — Assessment & Plan Note (Signed)
Severe symptoms with chronic leftward bend in neck since 2008, progressive per pt Unsuccessful steroid injections and PT in last few years - kirstens and Noel Gerold Will review other possible options for treatment - no change recommended at this time

## 2011-08-29 NOTE — Assessment & Plan Note (Signed)
Chronic, on anticoag Rate controlled - follows regularly with weintraub The current medical regimen is effective;  continue present plan and medications.

## 2011-08-31 ENCOUNTER — Telehealth: Payer: Self-pay | Admitting: *Deleted

## 2011-08-31 NOTE — Telephone Encounter (Signed)
I have advised patient that per Dr Alanda Amass, it is okay to hold Coumadin 5 days prior to procedure. See Note under "media" in Epic. Patient verbalizes understanding and has also rescheduled his appointment to 09-12-11 due to lack of transportation.

## 2011-09-04 ENCOUNTER — Encounter: Payer: Medicare Other | Admitting: Internal Medicine

## 2011-09-12 ENCOUNTER — Encounter: Payer: Self-pay | Admitting: Internal Medicine

## 2011-09-12 ENCOUNTER — Ambulatory Visit (AMBULATORY_SURGERY_CENTER): Payer: Medicare Other | Admitting: Internal Medicine

## 2011-09-12 VITALS — BP 118/65 | HR 90 | Temp 96.8°F | Resp 22 | Ht 68.0 in | Wt 160.0 lb

## 2011-09-12 DIAGNOSIS — R195 Other fecal abnormalities: Secondary | ICD-10-CM

## 2011-09-12 DIAGNOSIS — D649 Anemia, unspecified: Secondary | ICD-10-CM

## 2011-09-12 DIAGNOSIS — D509 Iron deficiency anemia, unspecified: Secondary | ICD-10-CM

## 2011-09-12 DIAGNOSIS — D126 Benign neoplasm of colon, unspecified: Secondary | ICD-10-CM

## 2011-09-12 MED ORDER — SODIUM CHLORIDE 0.9 % IV SOLN: 500.0000 mL | INTRAVENOUS | Status: DC

## 2011-09-12 NOTE — Patient Instructions (Signed)
A clip was placed in your colon at 60 cm where a polyp was removed today.  Resume your prior medications today except hold his coumadin for 2 weeks until 09-26-11.  Handouts were given on polyps, diverticulosis and high fiber diet to your care partner.  Please call if any questions or concerns.   YOU HAD AN ENDOSCOPIC PROCEDURE TODAY AT THE [facility]: Refer to the procedure report that was given to you for any specific questions about what was found during the examination.  If the procedure report does not answer your questions, please call your gastroenterologist to clarify.  If you requested that your care partner not be given the details of your procedure findings, then the procedure report has been included in a sealed envelope for you to review at your convenience later.  YOU SHOULD EXPECT: Some feelings of bloating in the abdomen. Passage of more gas than usual.  Walking can help get rid of the air that was put into your GI tract during the procedure and reduce the bloating. If you had a lower endoscopy (such as a colonoscopy or flexible sigmoidoscopy) you may notice spotting of blood in your stool or on the toilet paper. If you underwent a bowel prep for your procedure, then you may not have a normal bowel movement for a few days.  DIET: Your first meal following the procedure should be a light meal and then it is ok to progress to your normal diet.  A half-sandwich or bowl of soup is an example of a good first meal.  Heavy or fried foods are harder to digest and may make you feel nauseous or bloated.  Likewise meals heavy in dairy and vegetables can cause extra gas to form and this can also increase the bloating.  Drink plenty of fluids but you should avoid alcoholic beverages for 24 hours.  ACTIVITY: Your care partner should take you home directly after the procedure.  You should plan to take it easy, moving slowly for the rest of the day.  You can resume normal activity the day  after the procedure however you should NOT DRIVE or use heavy machinery for 24 hours (because of the sedation medicines used during the test).    SYMPTOMS TO REPORT IMMEDIATELY: A gastroenterologist can be reached at any hour.  During normal business hours, 8:30 AM to 5:00 PM Monday through Friday, call [phone].  After hours and on weekends, please call the GI answering service at [phone] who will take a message and have the physician on call contact you.   Following lower endoscopy (colonoscopy or flexible sigmoidoscopy):  Excessive amounts of blood in the stool  Significant tenderness or worsening of abdominal pains  Swelling of the abdomen that is new, acute  Fever of 100F or higher    FOLLOW UP: If any biopsies were taken you will be contacted by phone or by letter within the next 1-3 weeks.  Call your gastroenterologist if you have not heard about the biopsies in 3 weeks.  Our staff will call the home number listed on your records the next business day following your procedure to check on you and address any questions or concerns that you may have at that time regarding the information given to you following your procedure. This is a courtesy call and so if there is no answer at the home number and we have not heard from you through the emergency physician on call, we will assume that you have  returned to your regular daily activities without incident.  SIGNATURES/CONFIDENTIALITY: You and/or your care partner have signed paperwork which will be entered into your electronic medical record.  These signatures attest to the fact that that the information above on your After Visit Summary has been reviewed and is understood.  Full responsibility of the confidentiality of this discharge information lies with you and/or your care-partner.

## 2011-09-12 NOTE — Op Note (Signed)
[facility] [address] [place], [place]  [address]  COLONOSCOPY PROCEDURE REPORT  PATIENT:  Deleon, Eduardo  MR#:  [id number] BIRTHDATE:  02-25-1925, 86 yrs. old  GENDER:  male ENDOSCOPIST:  Hedwig Morton. Juanda Chance, MD REF. BY:  Rene Paci, M.D. PROCEDURE DATE:  09/12/2011 PROCEDURE:  Colonoscopy with snare polypectomy ASA CLASS:  Class III INDICATIONS:  heme positive stool, Anemia 1998 colon- hemorrhoids  pt has been holding Coumadin for 5 days MEDICATIONS:   MAC sedation, administered by CRNA, propofol (Diprivan) 400 mg  DESCRIPTION OF PROCEDURE:   After the risks and benefits and of the procedure were explained, informed consent was obtained. Digital rectal exam was performed and revealed no rectal masses. The LB [id number] endoscope was introduced through the anus and advanced to the cecum, which was identified by both the appendix and ileocecal valve.  The quality of the prep was Moviprep fair.  The instrument was then slowly withdrawn as the colon was fully examined. <<PROCEDUREIMAGES>>  FINDINGS:  A sessile polyp was found. 10mm sessikle polyp at 60cm Polyp was snared without cautery. Retrieval was successful (see image4 and image5). snare polyp polyp removed in 2 pieces, Endo clip applied to the polypectomy site  Moderate diverticulosis was found (see image6).  This was otherwise a normal examination of the colon (see image1, image2, image3, and image7).   Retroflexed views in the rectum revealed no abnormalities.    The scope was then withdrawn from the patient and the procedure completed.  COMPLICATIONS:  None ENDOSCOPIC IMPRESSION: 1) Sessile polyp 2) Moderate diverticulosis 3) Otherwise normal examination cold snare polypectomy, Endo clip applied to the post polypectomy site in view of pt going back o n Coumadin RECOMMENDATIONS: 1) Await pathology results hold Coumadin x 2 weeks  REPEAT EXAM:  In 0 year(s) for.  no recall due to  age,  ______________________________ Hedwig Morton. Juanda Chance, MD  CC:  n. eSIGNED:   Hedwig Morton. Eduardo Deleon at 09/12/2011 10:58 AM  Myra Gianotti, [phone]

## 2011-09-12 NOTE — Progress Notes (Signed)
No complaints noted in the recovery room.  I assited the pr with dressing.  He did fine.  No complaints noted. Maw  Patient did not experience any of the following events: a burn prior to discharge; a fall within the facility; wrong site/side/patient/procedure/implant event; or a hospital transfer or hospital admission upon discharge from the facility. [phone]) Patient did not have preoperative order for IV antibiotic SSI prophylaxis. [phone])

## 2011-09-13 ENCOUNTER — Telehealth: Payer: Self-pay | Admitting: *Deleted

## 2011-09-13 NOTE — Telephone Encounter (Signed)
No answer, left message to call office if questions or concerns. 

## 2011-09-18 ENCOUNTER — Encounter: Payer: Self-pay | Admitting: Internal Medicine

## 2011-10-30 ENCOUNTER — Telehealth: Payer: Self-pay | Admitting: Internal Medicine

## 2011-10-30 NOTE — Telephone Encounter (Signed)
Received copies from [facility],on 10/30/11. Forwarded  2 pages to Dr. Morrison Old review.

## 2011-11-29 ENCOUNTER — Encounter: Payer: Medicare Other | Admitting: Internal Medicine

## 2011-12-13 ENCOUNTER — Encounter: Payer: Medicare Other | Admitting: Internal Medicine

## 2012-01-15 ENCOUNTER — Telehealth: Payer: Self-pay | Admitting: Internal Medicine

## 2012-01-15 NOTE — Telephone Encounter (Signed)
Received 4 pages of medical records. Sent to Dr. [name]. 01/15/12/SD

## 2012-01-29 ENCOUNTER — Ambulatory Visit: Payer: Medicare Other | Admitting: Internal Medicine

## 2012-02-08 ENCOUNTER — Ambulatory Visit: Payer: Medicare Other | Admitting: Physical Medicine & Rehabilitation

## 2012-02-22 ENCOUNTER — Encounter: Payer: Medicare Other | Attending: Physical Medicine & Rehabilitation

## 2012-02-22 ENCOUNTER — Ambulatory Visit (HOSPITAL_BASED_OUTPATIENT_CLINIC_OR_DEPARTMENT_OTHER): Payer: Medicare Other | Admitting: Physical Medicine & Rehabilitation

## 2012-02-22 ENCOUNTER — Encounter: Payer: Self-pay | Admitting: Physical Medicine & Rehabilitation

## 2012-02-22 VITALS — BP 108/79 | HR 165 | Resp 16 | Ht 66.0 in | Wt 155.0 lb

## 2012-02-22 DIAGNOSIS — M436 Torticollis: Secondary | ICD-10-CM | POA: Insufficient documentation

## 2012-02-22 DIAGNOSIS — M79609 Pain in unspecified limb: Secondary | ICD-10-CM

## 2012-02-22 DIAGNOSIS — M79602 Pain in left arm: Secondary | ICD-10-CM

## 2012-02-22 DIAGNOSIS — R209 Unspecified disturbances of skin sensation: Secondary | ICD-10-CM

## 2012-02-22 NOTE — Patient Instructions (Signed)
I suspect you have a severe carpal tunnel syndrome and we will do I nerve study to confirm this. If the nerve study is negative we may need to do an MRI of your neck Also once we determine the cause of your hand problems, we can focus on the abnormal muscle tone in your neck.

## 2012-02-22 NOTE — Progress Notes (Signed)
Subjective:    Patient ID: Eduardo Deleon, male    DOB: [birth year], 78 y.o.   MRN: [id number]  HPIArm pain and  forearm pain.  Several month history of Bilateral hand and finger numbness mainly at night.  Has more proximal Symptoms as well. There is mild neck pain. He has a history of severe torticollis. Has not had much relief with previous attempt at Botox injection but did not followup for a repeat injection with a higher dose. He's had no new bowel or bladder difficulties. Pain Inventory Average Pain 3 Pain Right Now 3 My pain is intermittent, tingling and aching  In the last 24 hours, has pain interfered with the following? General activity 2 Relation with others 0 Enjoyment of life 2 What TIME of day is your pain at its worst? morning Sleep (in general) Good  Pain is worse with: sitting and inactivity Pain improves with: therapy/exercise Relief from Meds: 0  Mobility walk without assistance use a cane ability to climb steps?  yes do you drive?  no  Function retired I need assistance with the following:  shopping  Neuro/Psych weakness numbness tingling trouble walking  Prior Studies Any changes since last visit?  no  Physicians involved in your care Any changes since last visit?  no   Family History  Problem Relation Age of Onset  . Heart disease Father   . Breast cancer Mother   . Diabetes Brother   . Diabetes      grandmother  . Colon cancer Neg Hx    History   Social History  . Marital Status: Married    Spouse Name: N/A    Number of Children: N/A  . Years of Education: N/A   Social History Main Topics  . Smoking status: Never Smoker   . Smokeless tobacco: Never Used  . Alcohol Use: No  . Drug Use: No  . Sexually Active:    Other Topics Concern  . None   Social History Narrative  . None   Past Surgical History  Procedure Date  . Joint replacement bilaterial knees  . Lumbar laminectomy Per patient  heriated disc in mid spine    . Hernia repair     [phone], right - 1985  . Hemorrhoid surgery   . Tonsillectomy   . Colonoscopy     1988   Past Medical History  Diagnosis Date  . 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 Atrial fibrillation     chronic anticaog - weintraub  . Congestive heart failure   . Anemia   . Osteoarthritis   . Splenic lesion   . Venous stasis dermatitis     hx venous ulcer/cellulitis 06/2011 R and 04/2011 L  . Prostate cancer   . Anal fissure     Hx of  . Internal hemorrhoids   . Diverticulosis 04/12/1998    Left colon--Flex Sig  . CRI (chronic renal insufficiency)   . GERD (gastroesophageal reflux disease)   . PVD (peripheral vascular disease)   . Torticollis, acquired    BP 108/79  Pulse 165  Resp 16  Ht 5\' 6"  (1.676 m)  Wt 155 lb (70.308 kg)  BMI 25.02 kg/m2  SpO2 95%      Review of Systems  HENT: Negative.   Eyes: Negative.   Respiratory: Negative.   Cardiovascular: Negative.   Gastrointestinal: Negative.   Genitourinary: Negative.   Musculoskeletal: Positive for gait problem.  Neurological: Positive for weakness and numbness.  Hematological: Negative.  Psychiatric/Behavioral: Negative.        Objective:   Physical Exam  Neck: Rigidity present.   Negative Tinel's and negative Phalen's test Severe atrophy of the right and moderate atrophy of  left thenar eminence. No wasting of the first dorsal interosseous muscle Neck has severe lateral Towards the left side. Motor strength is 4/5 in bilateral grip  5/5 in the deltoid, biceps, triceps. Sensory exam is reduced over the entire hand to pinprick. It is worse on the left than on the right  Shoulder exam shows no evidence of impingement. He has full range of motion at the elbows. He is limited range of motion at bilateral wrists.       Assessment & Plan:  1. 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 Torticollis this seems to be worsening with time. He could be developed developing some foraminal narrowing  which could also mimic a carpal tunnel however his other C8 myotome muscles do not show any atrophy. Plan EMG NCV of both upper extremities Will address torticollis once the upper extremity diagnosis and treatment plan is clear

## 2012-03-14 ENCOUNTER — Ambulatory Visit: Payer: Medicare Other | Admitting: Physical Medicine & Rehabilitation

## 2012-04-22 ENCOUNTER — Encounter: Payer: Medicare Other | Admitting: Internal Medicine

## 2012-06-16 ENCOUNTER — Encounter: Payer: Medicare Other | Admitting: Internal Medicine

## 2012-07-07 ENCOUNTER — Telehealth: Payer: Self-pay | Admitting: Internal Medicine

## 2012-07-07 MED ORDER — DICYCLOMINE HCL 20 MG PO TABS: ORAL_TABLET | ORAL | Status: DC

## 2012-07-07 MED ORDER — RIFAXIMIN 550 MG PO TABS: ORAL_TABLET | ORAL | Status: DC

## 2012-07-07 NOTE — Telephone Encounter (Signed)
Spoke with patient and he had a colonoscopy in spring. He was doing okay until November. He reports he started having a change in stool. Stool became soft, diarrhea like but not watery. His stomach would growl and he feels like he needs to have a bowel movement. He would go to bathroom and have gas but no stool. This might happen 2-3 times. Then his stomach would growl and he would have urgent bowel movement.  Last few weeks, he has been ok during the day but is up and down at night when this starts.  Gas and bloating is excessive. Denies recent antibiotics or medication changes. Please, advise.

## 2012-07-07 NOTE — Telephone Encounter (Signed)
Patient notified of Dr. Regino Schultze recommendations. He will call me if Rifaximin is too expensive. Rx sent to [facility] on [address].

## 2012-07-07 NOTE — Telephone Encounter (Signed)
Please give Rifaximin 550 mg po bid x 10 days, if to expensive, Flagyl 250 mg po tid x 1 week. Also Bentyl 20 mg po bid, #40, 1 refill

## 2012-07-08 ENCOUNTER — Telehealth: Payer: Self-pay | Admitting: *Deleted

## 2012-07-08 MED ORDER — METRONIDAZOLE 250 MG PO TABS: 250.0000 mg | ORAL_TABLET | Freq: Three times a day (TID) | ORAL | Status: AC

## 2012-07-08 NOTE — Telephone Encounter (Signed)
Rx sent for Flagyl. Patient will call MD that manages his Coumadin and let them know he will be on Flagyl.

## 2012-07-08 NOTE — Telephone Encounter (Signed)
Insurance will not cover Xifaxan. Rx sent for Flagyl 250 mg po TID x 1 week as per Dr. Juanda Chance.

## 2012-07-14 ENCOUNTER — Telehealth: Payer: Self-pay | Admitting: Internal Medicine

## 2012-07-14 NOTE — Telephone Encounter (Signed)
Spoke with patient and he tried DIcyclomine and he was dizzy, light headed and weak. He tried it two more times and the same thing happened. He took the Flagyl but stopped the Dicyclomine. He is still having problems with IBS. States he was up last night. Feels like he needs to have bowel movement but nothing happens. Then, has urgent bowel movement. Has lots of gas that does not want to "come out." Please, advise.

## 2012-07-15 NOTE — Telephone Encounter (Signed)
Please try Linzess  266ug daily,  #30, 1 refill, do we have a coupon or samples,? thanx DB

## 2012-07-16 NOTE — Telephone Encounter (Signed)
Dr. Juanda Chance, Do you want Linzess 290 mcg or 145 mcg. He can double the 146 mcg if no results. Please, advise.

## 2012-07-16 NOTE — Telephone Encounter (Signed)
Spoke with patient and he is doing much better the last 2 days. States "I have been the most normal I have ever been the last 2 days." He states he continued taking his Flagyl and feels this helped him. He states he has one more day of Flagyl to take. He is wondering if he should take a couple more days of Flagyl. He would like to wait for a few days and see if he continues to do well before starting Linzess. He will call me early next week with update.

## 2012-07-16 NOTE — Telephone Encounter (Signed)
Since he is better, hold of on sending Linzess. Next time ,we could give him a refill on Flagyl 250 mg, #21, 1 po tid for bacterial overgrowth

## 2012-07-16 NOTE — Telephone Encounter (Signed)
Spoke with patient and gave him Dr. Regino Schultze recommendation. He will call with update next week.

## 2012-07-16 NOTE — Telephone Encounter (Signed)
Clarified with Dr. Juanda Chance. Dose will be 290 mcg daily.

## 2012-07-18 ENCOUNTER — Telehealth: Payer: Self-pay | Admitting: Internal Medicine

## 2012-07-18 NOTE — Telephone Encounter (Signed)
Patient called back today and he states he had the same feeling of needing to have a bowel movement but had nothing but gas, then urgent bowel movement later. He has decided he should try Linzess.

## 2012-07-18 NOTE — Telephone Encounter (Signed)
OK 

## 2012-07-21 MED ORDER — LINACLOTIDE 290 MCG PO CAPS: 290.0000 ug | ORAL_CAPSULE | Freq: Every day | ORAL | Status: DC

## 2012-07-21 NOTE — Telephone Encounter (Signed)
Samples up front for pickup. Patient aware.

## 2012-07-22 ENCOUNTER — Telehealth: Payer: Self-pay | Admitting: Internal Medicine

## 2012-07-22 NOTE — Telephone Encounter (Signed)
error 

## 2012-09-10 ENCOUNTER — Telehealth: Payer: Self-pay | Admitting: Internal Medicine

## 2012-09-10 NOTE — Telephone Encounter (Signed)
Spoke with patient and he tried Bentyl again. He did not have any problems this time. He will take it BID as needed.

## 2012-10-22 ENCOUNTER — Telehealth: Payer: Self-pay | Admitting: Internal Medicine

## 2012-10-22 MED ORDER — DICYCLOMINE HCL 20 MG PO TABS: ORAL_TABLET | ORAL | Status: DC

## 2012-10-22 NOTE — Telephone Encounter (Signed)
rx sent

## 2012-11-13 ENCOUNTER — Telehealth: Payer: Self-pay | Admitting: Pharmacist Clinician (PhC)/ Clinical Pharmacy Specialist

## 2012-11-13 NOTE — Telephone Encounter (Signed)
Pt calling, wanting more than 30 tabs/month on warfarin.  Per our records pt was switched to Xarelto 15mg  qd in April because of problems getting to INR appointments. Spoke w/ pt who states took xarelto x 3 days but caused to have increased IBS sympotms/ urinary frequency, so he switched himself back to warfarin 5mg  qd x 7.5mg  MF.   Scheduled appt for INR check next week, will adjust Rx accordingly at that time.  Pt voiced understanding

## 2012-11-21 ENCOUNTER — Ambulatory Visit (INDEPENDENT_AMBULATORY_CARE_PROVIDER_SITE_OTHER): Payer: Medicare Other | Admitting: Pharmacist Clinician (PhC)/ Clinical Pharmacy Specialist

## 2012-11-21 VITALS — BP 142/84 | HR 76

## 2012-11-21 DIAGNOSIS — I4891 Unspecified atrial fibrillation: Secondary | ICD-10-CM

## 2012-11-21 DIAGNOSIS — Z7901 Long term (current) use of anticoagulants: Secondary | ICD-10-CM | POA: Insufficient documentation

## 2012-12-18 ENCOUNTER — Ambulatory Visit: Payer: Medicare Other | Admitting: Pharmacist Clinician (PhC)/ Clinical Pharmacy Specialist

## 2012-12-24 ENCOUNTER — Ambulatory Visit (INDEPENDENT_AMBULATORY_CARE_PROVIDER_SITE_OTHER): Payer: Medicare Other | Admitting: Pharmacist Clinician (PhC)/ Clinical Pharmacy Specialist

## 2012-12-24 VITALS — BP 132/80 | HR 68

## 2012-12-24 DIAGNOSIS — Z7901 Long term (current) use of anticoagulants: Secondary | ICD-10-CM

## 2012-12-24 DIAGNOSIS — I4891 Unspecified atrial fibrillation: Secondary | ICD-10-CM

## 2012-12-24 LAB — POCT INR: INR: 2

## 2013-01-02 ENCOUNTER — Other Ambulatory Visit: Payer: Self-pay

## 2013-01-02 MED ORDER — DILTIAZEM HCL ER COATED BEADS 180 MG PO CP24: 180.0000 mg | ORAL_CAPSULE | Freq: Every day | ORAL | Status: DC

## 2013-01-02 NOTE — Telephone Encounter (Signed)
Rx was sent to pharmacy electronically via [employer].  

## 2013-01-19 ENCOUNTER — Other Ambulatory Visit: Payer: Self-pay | Admitting: *Deleted

## 2013-01-19 MED ORDER — DICYCLOMINE HCL 20 MG PO TABS: ORAL_TABLET | ORAL | Status: DC

## 2013-01-22 ENCOUNTER — Ambulatory Visit (INDEPENDENT_AMBULATORY_CARE_PROVIDER_SITE_OTHER): Payer: Medicare Other | Admitting: Pharmacist Clinician (PhC)/ Clinical Pharmacy Specialist

## 2013-01-22 DIAGNOSIS — Z7901 Long term (current) use of anticoagulants: Secondary | ICD-10-CM

## 2013-01-22 DIAGNOSIS — I4891 Unspecified atrial fibrillation: Secondary | ICD-10-CM

## 2013-01-22 LAB — POCT INR: INR: 2.7

## 2013-01-28 ENCOUNTER — Emergency Department (HOSPITAL_COMMUNITY)
Admission: EM | Admit: 2013-01-28 | Discharge: 2013-01-28 | Disposition: A | Discharge status: Home / Self Care | Payer: Medicare Other | Attending: Emergency Medicine | Admitting: Emergency Medicine

## 2013-01-28 ENCOUNTER — Encounter (HOSPITAL_COMMUNITY): Payer: Self-pay

## 2013-01-28 DIAGNOSIS — Y92009 Unspecified place in unspecified non-institutional (private) residence as the place of occurrence of the external cause: Secondary | ICD-10-CM | POA: Insufficient documentation

## 2013-01-28 DIAGNOSIS — Z043 Encounter for examination and observation following other accident: Secondary | ICD-10-CM | POA: Insufficient documentation

## 2013-01-28 DIAGNOSIS — N189 Chronic kidney disease, unspecified: Secondary | ICD-10-CM | POA: Insufficient documentation

## 2013-01-28 DIAGNOSIS — Y9389 Activity, other specified: Secondary | ICD-10-CM | POA: Insufficient documentation

## 2013-01-28 DIAGNOSIS — Z872 Personal history of diseases of the skin and subcutaneous tissue: Secondary | ICD-10-CM | POA: Insufficient documentation

## 2013-01-28 DIAGNOSIS — Z8679 Personal history of other diseases of the circulatory system: Secondary | ICD-10-CM | POA: Insufficient documentation

## 2013-01-28 DIAGNOSIS — I4891 Unspecified atrial fibrillation: Secondary | ICD-10-CM | POA: Insufficient documentation

## 2013-01-28 DIAGNOSIS — Z8546 Personal history of malignant neoplasm of prostate: Secondary | ICD-10-CM | POA: Insufficient documentation

## 2013-01-28 DIAGNOSIS — W19XXXA Unspecified fall, initial encounter: Secondary | ICD-10-CM

## 2013-01-28 DIAGNOSIS — Z79899 Other long term (current) drug therapy: Secondary | ICD-10-CM | POA: Insufficient documentation

## 2013-01-28 DIAGNOSIS — Z8673 Personal history of transient ischemic attack (TIA), and cerebral infarction without residual deficits: Secondary | ICD-10-CM | POA: Insufficient documentation

## 2013-01-28 DIAGNOSIS — Z8719 Personal history of other diseases of the digestive system: Secondary | ICD-10-CM | POA: Insufficient documentation

## 2013-01-28 DIAGNOSIS — R296 Repeated falls: Secondary | ICD-10-CM | POA: Insufficient documentation

## 2013-01-28 DIAGNOSIS — Z7901 Long term (current) use of anticoagulants: Secondary | ICD-10-CM | POA: Insufficient documentation

## 2013-01-28 DIAGNOSIS — Z8739 Personal history of other diseases of the musculoskeletal system and connective tissue: Secondary | ICD-10-CM | POA: Insufficient documentation

## 2013-01-28 DIAGNOSIS — Z862 Personal history of diseases of the blood and blood-forming organs and certain disorders involving the immune mechanism: Secondary | ICD-10-CM | POA: Insufficient documentation

## 2013-01-28 DIAGNOSIS — I509 Heart failure, unspecified: Secondary | ICD-10-CM | POA: Insufficient documentation

## 2013-01-28 NOTE — ED Provider Notes (Signed)
CSN: [id number]     Arrival date & time 01/28/13  0135 History     First MD Initiated Contact with Patient 01/28/13 [phone]     Chief Complaint  Patient presents with  . Fall   (Consider location/radiation/quality/duration/timing/severity/associated sxs/prior Treatment) HPI This 79 year old male lives at home alone, he has chronic atrial fibrillation on chronic Coumadin, he reached for a urinal this evening and lost his balance falling but did not hurt himself at all, his lungs are clear his cardiac exam is irregularly irregular without audible murmur his neck is nontender he had no head trauma. Past Medical History  Diagnosis Date  . 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 Atrial fibrillation     chronic anticaog - weintraub  . Congestive heart failure   . Anemia   . Osteoarthritis   . Splenic lesion   . Venous stasis dermatitis     hx venous ulcer/cellulitis 06/2011 R and 04/2011 L  . Prostate cancer   . Anal fissure     Hx of  . Internal hemorrhoids   . Diverticulosis 04/12/1998    Left colon--Flex Sig  . CRI (chronic renal insufficiency)   . GERD (gastroesophageal reflux disease)   . PVD (peripheral vascular disease)   . Torticollis, acquired    Past Surgical History  Procedure Laterality Date  . Joint replacement  bilaterial knees  . Lumbar laminectomy  Per patient  heriated disc in mid spine  . Hernia repair      [phone], right - 1985  . Hemorrhoid surgery    . Tonsillectomy    . Colonoscopy      1988   Family History  Problem Relation Age of Onset  . Heart disease Father   . Breast cancer Mother   . Diabetes Brother   . Diabetes      grandmother  . Colon cancer Neg Hx    History  Substance Use Topics  . Smoking status: Never Smoker   . Smokeless tobacco: Never Used  . Alcohol Use: No    Review of Systems 10 Systems reviewed and are negative for acute change except as noted in the HPI. Allergies  Review of patient's allergies indicates no known  allergies.  Home Medications   Current Outpatient Rx  Name  Route  Sig  Dispense  Refill  . cholecalciferol (VITAMIN D) 1000 UNITS tablet   Oral   Take 2,000 Units by mouth daily.           Marland Kitchen dicyclomine (BENTYL) 20 MG tablet      Take one po BID   60 tablet   2   . diltiazem (CARDIZEM CD) 180 MG 24 hr capsule   Oral   Take 1 capsule (180 mg total) by mouth daily.   30 capsule   2   . doxazosin (CARDURA) 2 MG tablet   Oral   Take 2 mg by mouth at bedtime.           . dutasteride (AVODART) 0.5 MG capsule   Oral   Take 0.5 mg by mouth daily.           Marland Kitchen Fe Bisgly-Succ-C-Thre-B12-FA (IRON-150 PO)   Oral   Take by mouth as directed.         . finasteride (PROSCAR) 5 MG tablet   Oral   Take 5 mg by mouth daily.         . furosemide (LASIX) 20 MG tablet   Oral   Take 20  mg by mouth daily.           . Linaclotide (LINZESS) 290 MCG CAPS   Oral   Take 290 mcg by mouth daily.   8 capsule   0     Lot # [id number] Exp 04/14   . magnesium oxide (MAG-OXIDE) 400 MG tablet   Oral   Take 400 mg by mouth daily.         Marland Kitchen omeprazole (PRILOSEC) 20 MG capsule   Oral   Take 1 capsule (20 mg total) by mouth 2 (two) times daily.   30 capsule   0   . potassium chloride (KLOR-CON) 10 MEQ CR tablet   Oral   Take 10 mEq by mouth daily.          . rifaximin (XIFAXAN) 550 MG TABS      Take 550 mg BID x 10 days.   20 tablet   0   . warfarin (COUMADIN) 5 MG tablet   Oral   Take 2.5-5 mg by mouth at bedtime. 1 tablet daily except on Fridays and Mondays patient takes 0.5 tablet          BP 122/69  Pulse 68  Temp(Src) 98.2 F (36.8 C) (Oral)  Resp 18  SpO2 94% Physical Exam  Nursing note and vitals reviewed. Constitutional:  Awake, alert, nontoxic appearance.  HENT:  Head: Atraumatic.  Eyes: Right eye exhibits no discharge. Left eye exhibits no discharge.  Neck: Neck supple.  Cervical spine back nontender  Cardiovascular: Normal rate.   No murmur  heard. Irregular rhythm  Pulmonary/Chest: Effort normal and breath sounds normal. No respiratory distress. He has no wheezes. He has no rales. He exhibits no tenderness.  Abdominal: Soft. Bowel sounds are normal. He exhibits no distension. There is no tenderness. There is no rebound.  Musculoskeletal: He exhibits no edema and no tenderness.  Baseline ROM, no obvious new focal weakness.  Neurological: He is alert.  Mental status and motor strength appears baseline for patient and situation.  Skin: No rash noted.  Psychiatric: He has a normal mood and affect.    ED Course   Procedures (including critical care time)  Labs Reviewed - No data to display No results found. 1. Fall, initial encounter     MDM  I doubt any other EMC precluding discharge at this time including, but not necessarily limited to the following:TBI, CSI.  Hurman Horn, MD 01/28/13 [phone]

## 2013-01-28 NOTE — ED Notes (Addendum)
0300  Pt relaxing in the bed at this time will continue to monitor the pt.  Pt denies any pain   0400  Pt still relaxing with no complaints   0530  Pt up to bedside to urinate.  Pt has no pain moving and is waiting patiently.   No change in status at this time

## 2013-01-28 NOTE — ED Notes (Signed)
EMS-pt lives at home by himself. Pt was just set up with home health care, has had 2 visits. Pt uses walker for assistance. Pt reports he was standing up to use his urinal and lost balance and fell backwards. Pt pushed life alert. Pt denies hitting head or injuring himself. Pt states that he did not want to be at home by himself.

## 2013-02-16 ENCOUNTER — Other Ambulatory Visit: Payer: Self-pay | Admitting: *Deleted

## 2013-03-05 ENCOUNTER — Ambulatory Visit (INDEPENDENT_AMBULATORY_CARE_PROVIDER_SITE_OTHER): Payer: Medicare Other | Admitting: Pharmacist Clinician (PhC)/ Clinical Pharmacy Specialist

## 2013-03-05 DIAGNOSIS — Z7901 Long term (current) use of anticoagulants: Secondary | ICD-10-CM

## 2013-03-05 DIAGNOSIS — I4891 Unspecified atrial fibrillation: Secondary | ICD-10-CM

## 2013-03-05 LAB — POCT INR: INR: 2.2

## 2013-03-12 ENCOUNTER — Other Ambulatory Visit: Payer: Self-pay

## 2013-03-12 MED ORDER — MAGNESIUM OXIDE 400 MG PO TABS: 400.0000 mg | ORAL_TABLET | Freq: Every day | ORAL | Status: DC

## 2013-03-12 NOTE — Telephone Encounter (Signed)
Rx faxed to pharmacy  

## 2013-03-23 ENCOUNTER — Other Ambulatory Visit: Payer: Self-pay | Admitting: Cardiovascular Disease

## 2013-03-23 ENCOUNTER — Ambulatory Visit (INDEPENDENT_AMBULATORY_CARE_PROVIDER_SITE_OTHER): Payer: Medicare Other | Admitting: Pharmacist Clinician (PhC)/ Clinical Pharmacy Specialist

## 2013-03-23 DIAGNOSIS — Z7901 Long term (current) use of anticoagulants: Secondary | ICD-10-CM

## 2013-03-23 DIAGNOSIS — I4891 Unspecified atrial fibrillation: Secondary | ICD-10-CM

## 2013-03-23 LAB — CBC WITH DIFFERENTIAL/PLATELET
Eosinophils Absolute: 0 10*3/uL (ref 0.0–0.7)
Hemoglobin: 13.4 g/dL (ref 13.0–17.0)
Lymphocytes Relative: 29 % (ref 12–46)
Lymphs Abs: 1.7 10*3/uL (ref 0.7–4.0)
MCH: 28 pg (ref 26.0–34.0)
Neutro Abs: 3.7 10*3/uL (ref 1.7–7.7)
Neutrophils Relative %: 63 % (ref 43–77)
Platelets: 159 10*3/uL (ref 150–400)
RBC: 4.78 MIL/uL (ref 4.22–5.81)
WBC: 5.8 10*3/uL (ref 4.0–10.5)

## 2013-03-23 LAB — POCT INR: INR: 2

## 2013-03-24 ENCOUNTER — Encounter: Payer: Self-pay | Admitting: Cardiovascular Disease

## 2013-03-24 LAB — COMPREHENSIVE METABOLIC PANEL
ALT: 10 U/L (ref 0–53)
Albumin: 4.4 g/dL (ref 3.5–5.2)
Alkaline Phosphatase: 72 U/L (ref 39–117)
CO2: 26 mEq/L (ref 19–32)
Potassium: 4 mEq/L (ref 3.5–5.3)
Sodium: 136 mEq/L (ref 135–145)
Total Bilirubin: 0.8 mg/dL (ref 0.3–1.2)
Total Protein: 7.2 g/dL (ref 6.0–8.3)

## 2013-03-24 LAB — TSH: TSH: 2.624 u[IU]/mL (ref 0.350–4.500)

## 2013-04-07 ENCOUNTER — Other Ambulatory Visit: Payer: Self-pay | Admitting: *Deleted

## 2013-04-07 MED ORDER — DILTIAZEM HCL ER COATED BEADS 180 MG PO CP24: 180.0000 mg | ORAL_CAPSULE | Freq: Every day | ORAL | Status: AC

## 2013-04-13 ENCOUNTER — Other Ambulatory Visit: Payer: Self-pay | Admitting: *Deleted

## 2013-04-13 MED ORDER — MAGNESIUM OXIDE 400 MG PO TABS: 400.0000 mg | ORAL_TABLET | Freq: Every day | ORAL | Status: DC

## 2013-04-13 MED ORDER — DICYCLOMINE HCL 20 MG PO TABS: ORAL_TABLET | ORAL | Status: DC

## 2013-04-16 ENCOUNTER — Ambulatory Visit: Payer: Medicare Other | Admitting: Pharmacist Clinician (PhC)/ Clinical Pharmacy Specialist

## 2013-05-27 ENCOUNTER — Ambulatory Visit (INDEPENDENT_AMBULATORY_CARE_PROVIDER_SITE_OTHER): Payer: Medicare Other | Admitting: Internal Medicine

## 2013-05-27 ENCOUNTER — Encounter: Payer: Self-pay | Admitting: Internal Medicine

## 2013-05-27 VITALS — BP 122/78 | HR 79 | Temp 97.8°F | Wt 140.8 lb

## 2013-05-27 DIAGNOSIS — M436 Torticollis: Secondary | ICD-10-CM

## 2013-05-27 DIAGNOSIS — Z0000 Encounter for general adult medical examination without abnormal findings: Secondary | ICD-10-CM

## 2013-05-27 DIAGNOSIS — I872 Venous insufficiency (chronic) (peripheral): Secondary | ICD-10-CM

## 2013-05-27 DIAGNOSIS — R269 Unspecified abnormalities of gait and mobility: Secondary | ICD-10-CM

## 2013-05-27 DIAGNOSIS — I4891 Unspecified atrial fibrillation: Secondary | ICD-10-CM

## 2013-05-27 DIAGNOSIS — I878 Other specified disorders of veins: Secondary | ICD-10-CM

## 2013-05-27 NOTE — Progress Notes (Signed)
Pre-visit discussion using our clinic review tool. No additional management support is needed unless otherwise documented below in the visit note.  

## 2013-05-27 NOTE — Assessment & Plan Note (Signed)
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

## 2013-05-27 NOTE — Patient Instructions (Addendum)
It was good to see you today.  We have reviewed your prior records including labs and tests today  Health Maintenance reviewed - all recommended immunizations and age-appropriate screenings are up-to-date.  Medications reviewed and updated, no changes recommended at this time.  we'll make referral to [place] home health Our office will contact you regarding appointment(s) once made.  Please schedule followup in 12 months for annual exam, call sooner if problems.  Health Maintenance, Males A healthy lifestyle and preventative care can promote health and wellness.  Maintain regular health, dental, and eye exams.  Eat a healthy diet. Foods like vegetables, fruits, whole grains, low-fat dairy products, and lean protein foods contain the nutrients you need without too many calories. Decrease your intake of foods high in solid fats, added sugars, and salt. Get information about a proper diet from your caregiver, if necessary.  Regular physical exercise is one of the most important things you can do for your health. Most adults should get at least 150 minutes of moderate-intensity exercise (any activity that increases your heart rate and causes you to sweat) each week. In addition, most adults need muscle-strengthening exercises on 2 or more days a week.   Maintain a healthy weight. The body mass index (BMI) is a screening tool to identify possible weight problems. It provides an estimate of body fat based on height and weight. Your caregiver can help determine your BMI, and can help you achieve or maintain a healthy weight. For adults 20 years and older:  A BMI below 18.5 is considered underweight.  A BMI of 18.5 to 24.9 is normal.  A BMI of 25 to 29.9 is considered overweight.  A BMI of 30 and above is considered obese.  Maintain normal blood lipids and cholesterol by exercising and minimizing your intake of saturated fat. Eat a balanced diet with plenty of fruits and vegetables. Blood  tests for lipids and cholesterol should begin at age 45 and be repeated every 5 years. If your lipid or cholesterol levels are high, you are over 50, or you are a high risk for heart disease, you may need your cholesterol levels checked more frequently.Ongoing high lipid and cholesterol levels should be treated with medicines, if diet and exercise are not effective.  If you smoke, find out from your caregiver how to quit. If you do not use tobacco, do not start.  Lung cancer screening is recommended for adults aged 52 80 years who are at high risk for developing lung cancer because of a history of smoking. Yearly low-dose computed tomography (CT) is recommended for people who have at least a 30-pack-year history of smoking and are a current smoker or have quit within the past 15 years. A pack year of smoking is smoking an average of 1 pack of cigarettes a day for 1 year (for example: 1 pack a day for 30 years or 2 packs a day for 15 years). Yearly screening should continue until the smoker has stopped smoking for at least 15 years. Yearly screening should also be stopped for people who develop a health problem that would prevent them from having lung cancer treatment.  If you choose to drink alcohol, do not exceed 2 drinks per day. One drink is considered to be 12 ounces (355 mL) of beer, 5 ounces (148 mL) of wine, or 1.5 ounces (44 mL) of liquor.  Avoid use of street drugs. Do not share needles with anyone. Ask for help if you need support or instructions about  stopping the use of drugs.  High blood pressure causes heart disease and increases the risk of stroke. Blood pressure should be checked at least every 1 to 2 years. Ongoing high blood pressure should be treated with medicines if weight loss and exercise are not effective.  If you are 72 to 77 years old, ask your caregiver if you should take aspirin to prevent heart disease.  Diabetes screening involves taking a blood sample to check your  fasting blood sugar level. This should be done once every 3 years, after age 65, if you are within normal weight and without risk factors for diabetes. Testing should be considered at a younger age or be carried out more frequently if you are overweight and have at least 1 risk factor for diabetes.  Colorectal cancer can be detected and often prevented. Most routine colorectal cancer screening begins at the age of 74 and continues through age 33. However, your caregiver may recommend screening at an earlier age if you have risk factors for colon cancer. On a yearly basis, your caregiver may provide home test kits to check for hidden blood in the stool. Use of a small camera at the end of a tube, to directly examine the colon (sigmoidoscopy or colonoscopy), can detect the earliest forms of colorectal cancer. Talk to your caregiver about this at age 71, when routine screening begins. Direct examination of the colon should be repeated every 5 to 10 years through age 58, unless early forms of pre-cancerous polyps or small growths are found.  Hepatitis C blood testing is recommended for all people born from [birth year] through 1963 and any individual with known risks for hepatitis C.  Healthy men should no longer receive prostate-specific antigen (PSA) blood tests as part of routine cancer screening. Consult with your caregiver about prostate cancer screening.  Testicular cancer screening is not recommended for adolescents or adult males who have no symptoms. Screening includes self-exam, caregiver exam, and other screening tests. Consult with your caregiver about any symptoms you have or any concerns you have about testicular cancer.  Practice safe sex. Use condoms and avoid high-risk sexual practices to reduce the spread of sexually transmitted infections (STIs).  Use sunscreen. Apply sunscreen liberally and repeatedly throughout the day. You should seek shade when your shadow is shorter than you. Protect yourself  by wearing long sleeves, pants, a wide-brimmed hat, and sunglasses year round, whenever you are outdoors.  Notify your caregiver of new moles or changes in moles, especially if there is a change in shape or color. Also notify your caregiver if a mole is larger than the size of a pencil eraser.  A one-time screening for abdominal aortic aneurysm (AAA) and surgical repair of large AAAs by sound wave imaging (ultrasonography) is recommended for ages 18 to 89 years who are current or former smokers.  Stay current with your immunizations. Document Released: 12/08/2007 Document Revised: 10/06/2012 Document Reviewed: 11/06/2010 [facility] Patient Information 2014 [place], [place].

## 2013-05-27 NOTE — Assessment & Plan Note (Signed)
Chronic, on anticoag Rate controlled - follows regularly with cards, prev weintraub, now kelly The current medical regimen is effective;  continue present plan and medications.

## 2013-05-27 NOTE — Progress Notes (Signed)
Subjective:    Patient ID: Eduardo Deleon, male    DOB: [birth year], 79 y.o.   MRN: [id number]  HPI  Last OV 08/2011 Here for "IBS" flare  Also reviewed chronic medical issues today:  Cervical torticollis - onset 2008, head "falls to left" chronically - unsuccessful tx by PMR in 2011 and prior eval by spine specialist (Eduardo Deleon) and injections in 2010 - also little help with PT  atrial fibrillation - chronic anticoag - followed with cards Eduardo Deleon) for same - no chest pain or palpitations  Hx recurrent hospitalizations for venous insuff with ulceration and cellulitis: RLE 06/2011 and LLE 04/2011 (and 2011)- uses compression hose to control same, has completed HH wound care - no active ulcers, weeping or problems  BPH and prostate cancer -follows with uro for same; last PSA 6, no tx for cancer at this time, monitor PSA q30mo - voiding ok - meds reviewed  Past Medical History  Diagnosis Date  . 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 Atrial fibrillation     chronic anticaog - Eduardo Deleon  . Congestive heart failure   . Anemia   . Osteoarthritis   . Splenic lesion   . Venous stasis dermatitis     hx venous ulcer/cellulitis 06/2011 R and 04/2011 L  . Prostate cancer   . Anal fissure     Hx of  . Internal hemorrhoids   . Diverticulosis 04/12/1998    Left colon--Flex Sig  . CRI (chronic renal insufficiency)   . GERD (gastroesophageal reflux disease)   . PVD (peripheral vascular disease)   . Torticollis, acquired    Family History  Problem Relation Age of Onset  . Heart disease Father   . Breast cancer Mother   . Diabetes Brother   . Diabetes      grandmother  . Colon cancer Neg Hx    History  Substance Use Topics  . Smoking status: Never Smoker   . Smokeless tobacco: Never Used  . Alcohol Use: No    Review of Systems  Constitutional: Negative for fever, activity change, appetite change, fatigue and unexpected weight change.  Respiratory: Negative for cough, chest tightness,  shortness of breath and wheezing.   Cardiovascular: Negative for chest pain, palpitations and leg swelling.  Musculoskeletal: Positive for myalgias. Negative for joint swelling. Gait problem: increasing balance problems but no falls.  Neurological: Negative for dizziness, weakness and headaches.  Psychiatric/Behavioral: Negative for dysphoric mood and decreased concentration. The patient is not nervous/anxious.   All other systems reviewed and are negative.        Objective:   Physical Exam  BP 122/78  Pulse 79  Temp(Src) 97.8 F (36.6 C) (Oral)  Wt 140 lb 12.8 oz (63.866 kg)  SpO2 95% Wt Readings from Last 3 Encounters:  05/27/13 140 lb 12.8 oz (63.866 kg)  02/22/12 155 lb (70.308 kg)  09/12/11 160 lb (72.576 kg)   Constitutional:  He appears well-developed and well-nourished. No distress.  Neck: marked torticollis with leftward lean - supports head with his hand during conversation. Diminished rotation and flexion/extension. No LAD or JVD present. No thyromegaly present.  Cardiovascular: Normal rate, irregular rhythm and normal heart sounds.  No murmur heard. trace BLE edema with bilateral, distal chronic venous stasis changes. Pulmonary/Chest: Effort normal and breath sounds normal. No respiratory distress. no wheezes.  Abdominal: Soft. Bowel sounds are normal. Patient exhibits no distension. There is no tenderness.  Musculoskeletal: Neck deformity as noted above. Also kyphosis. No other gross deformities. Patient  walks with RW support Neurological: he is alert and oriented to person, place, and time. No cranial nerve deficit. Speech and cognition normal.  Skin: Chronic venous changes bilateral distal legs  No erythema or ulceration.  Psychiatric: he has a normal mood and affect. behavior is normal. Judgment and thought content normal.   Lab Results  Component Value Date   WBC 5.8 03/23/2013   HGB 13.4 03/23/2013   HCT 39.3 03/23/2013   PLT 159 03/23/2013   GLUCOSE 105*  03/23/2013   ALT 10 03/23/2013   AST 16 03/23/2013   NA 136 03/23/2013   K 4.0 03/23/2013   CL 100 03/23/2013   CREATININE 1.14 03/23/2013   BUN 13 03/23/2013   CO2 26 03/23/2013   TSH 2.624 03/23/2013   INR 2 03/23/2013   CT C-spine 09/2009:IMPRESSION:    1.  Severe cervical spondylosis with irregular erosions of the left C2-C3 facet joint and multiple disc spaces.  There is 5 mm of anterolisthesis of C2 on C3 resulting in mild to moderate central and biforaminal stenosis.     Assessment & Plan:   AWV/v70.0 - Today patient counseled on age appropriate routine health concerns for screening and prevention, each reviewed and up to date or declined. Immunizations reviewed and up to date or declined. Labs/ECG reviewed. Risk factors for depression reviewed and negative. Hearing function and visual acuity are intact. ADLs screened and addressed as needed. Functional ability and level of safety reviewed and appropriate. Education, counseling and referrals performed based on assessed risks today. Patient provided with a copy of personalized plan for preventive services.   Also see problem list. Medications and labs reviewed today.  Time spent with pt today 45 minutes, greater than 50% time spent counseling patient on prior 06/2011 and 04/2011 hosp for cellulitis and venous insuff, torticollis and chronic neck symptom contributing to balance and gait issues and medication review. Also review of prior records

## 2013-05-27 NOTE — Assessment & Plan Note (Signed)
Chronic skin changes hosp 06/2011 and 04/2011 for cellulitis and ulceration reviewed - legs healed back to baseline Continue compression hose support - lasix as needed per cards [facility] aide and RN to assist as needed - ordered today

## 2013-05-29 ENCOUNTER — Other Ambulatory Visit: Payer: Self-pay | Admitting: *Deleted

## 2013-05-29 MED ORDER — POTASSIUM CHLORIDE ER 10 MEQ PO TBCR: 10.0000 meq | EXTENDED_RELEASE_TABLET | Freq: Every day | ORAL | Status: DC

## 2013-06-02 ENCOUNTER — Telehealth: Payer: Self-pay | Admitting: *Deleted

## 2013-06-02 NOTE — Telephone Encounter (Signed)
Left msg on vm 06/01/13 requesting verbal orders for home health nurse to come out 1 x week for 4 weeks...Raechel Chute

## 2013-06-02 NOTE — Telephone Encounter (Signed)
Called [facility] no answer LMOM md response...lmb

## 2013-06-02 NOTE — Telephone Encounter (Signed)
Verbal ok?

## 2013-06-03 ENCOUNTER — Telehealth: Payer: Self-pay | Admitting: *Deleted

## 2013-06-03 NOTE — Telephone Encounter (Signed)
[facility] PT called requesting verbal order for PT twice weekly for 4 weeks.  Please advise

## 2013-06-03 NOTE — Telephone Encounter (Signed)
Verbal ok. thanks

## 2013-06-04 NOTE — Telephone Encounter (Signed)
Left detailed message on VM of MDs message. 

## 2013-06-10 DIAGNOSIS — R269 Unspecified abnormalities of gait and mobility: Secondary | ICD-10-CM

## 2013-06-10 DIAGNOSIS — I129 Hypertensive chronic kidney disease with stage 1 through stage 4 chronic kidney disease, or unspecified chronic kidney disease: Secondary | ICD-10-CM

## 2013-06-10 DIAGNOSIS — M436 Torticollis: Secondary | ICD-10-CM

## 2013-06-10 DIAGNOSIS — D649 Anemia, unspecified: Secondary | ICD-10-CM

## 2013-06-10 DIAGNOSIS — I872 Venous insufficiency (chronic) (peripheral): Secondary | ICD-10-CM

## 2013-07-17 ENCOUNTER — Other Ambulatory Visit: Payer: Self-pay | Admitting: *Deleted

## 2013-07-17 MED ORDER — DICYCLOMINE HCL 20 MG PO TABS: ORAL_TABLET | ORAL | Status: DC

## 2013-07-24 ENCOUNTER — Encounter: Payer: Medicare Other | Admitting: Internal Medicine

## 2013-08-13 ENCOUNTER — Ambulatory Visit: Payer: Medicare Other | Admitting: Cardiovascular Disease

## 2013-09-09 ENCOUNTER — Encounter: Payer: Self-pay | Admitting: Cardiovascular Disease

## 2013-09-09 ENCOUNTER — Ambulatory Visit (INDEPENDENT_AMBULATORY_CARE_PROVIDER_SITE_OTHER): Payer: Medicare Other | Admitting: Pharmacist Clinician (PhC)/ Clinical Pharmacy Specialist

## 2013-09-09 ENCOUNTER — Ambulatory Visit (INDEPENDENT_AMBULATORY_CARE_PROVIDER_SITE_OTHER): Payer: Medicare Other | Admitting: Cardiovascular Disease

## 2013-09-09 VITALS — BP 120/76 | HR 99 | Ht 66.0 in | Wt 140.6 lb

## 2013-09-09 DIAGNOSIS — R5381 Other malaise: Secondary | ICD-10-CM

## 2013-09-09 DIAGNOSIS — Z7901 Long term (current) use of anticoagulants: Secondary | ICD-10-CM

## 2013-09-09 DIAGNOSIS — I4891 Unspecified atrial fibrillation: Secondary | ICD-10-CM

## 2013-09-09 DIAGNOSIS — Z13220 Encounter for screening for lipoid disorders: Secondary | ICD-10-CM

## 2013-09-09 DIAGNOSIS — K219 Gastro-esophageal reflux disease without esophagitis: Secondary | ICD-10-CM

## 2013-09-09 DIAGNOSIS — R5383 Other fatigue: Secondary | ICD-10-CM

## 2013-09-09 DIAGNOSIS — Z79899 Other long term (current) drug therapy: Secondary | ICD-10-CM

## 2013-09-09 DIAGNOSIS — C61 Malignant neoplasm of prostate: Secondary | ICD-10-CM

## 2013-09-09 LAB — POCT INR: INR: 1.5

## 2013-09-09 MED ORDER — APIXABAN 2.5 MG PO TABS: 2.5000 mg | ORAL_TABLET | Freq: Two times a day (BID) | ORAL | Status: DC

## 2013-09-09 NOTE — Patient Instructions (Signed)
Your physician recommends that you schedule a follow-up appointment in: 6 months  Your physician has recommended you make the following change in your medication: Stop Warfarin and Start Eliqius 2.5 mg twice daily  Your physician recommends that you return for lab work in: CMP, CBC, TSH, FASTING LIPIDS

## 2013-09-25 ENCOUNTER — Other Ambulatory Visit: Payer: Self-pay | Admitting: *Deleted

## 2013-09-25 ENCOUNTER — Telehealth: Payer: Self-pay | Admitting: Gastroenterology

## 2013-09-25 MED ORDER — DICYCLOMINE HCL 20 MG PO TABS: ORAL_TABLET | ORAL | Status: DC

## 2013-09-25 MED ORDER — POLYSACCHARIDE IRON COMPLEX 150 MG PO CAPS: 150.0000 mg | ORAL_CAPSULE | Freq: Every day | ORAL | Status: AC

## 2013-09-25 NOTE — Telephone Encounter (Signed)
Rx refill sent to patients pharmacy  

## 2013-09-25 NOTE — Telephone Encounter (Signed)
He called for bentyl refill, he says this really helps.  I called in 60 pills, no refills (EPIC note says he needs office visit for more refills).    He needs call from the office to schedule ROV

## 2013-09-28 NOTE — Telephone Encounter (Signed)
Attempted to call patient. There was no response and no voicemail. I will attempt to call back at a later time.

## 2013-09-29 ENCOUNTER — Other Ambulatory Visit: Payer: Self-pay | Admitting: *Deleted

## 2013-09-29 NOTE — Telephone Encounter (Signed)
Patient states that he does not have transportation to get to appointment with Dr Olevia Perches. He will speak with his godson who comes up every 2-3 weeks to check on him and they will call back with some dates that they can come for visit.

## 2013-10-08 ENCOUNTER — Telehealth: Payer: Self-pay | Admitting: Cardiovascular Disease

## 2013-10-08 ENCOUNTER — Encounter (HOSPITAL_COMMUNITY): Payer: Self-pay | Admitting: Emergency Medicine

## 2013-10-08 ENCOUNTER — Observation Stay (HOSPITAL_COMMUNITY)
Admission: EM | Admit: 2013-10-08 | Discharge: 2013-10-12 | Disposition: A | Discharge status: Home / Self Care | Payer: Medicare Other | Attending: Internal Medicine | Admitting: Internal Medicine

## 2013-10-08 ENCOUNTER — Emergency Department (HOSPITAL_COMMUNITY): Payer: Medicare Other

## 2013-10-08 DIAGNOSIS — I509 Heart failure, unspecified: Secondary | ICD-10-CM | POA: Insufficient documentation

## 2013-10-08 DIAGNOSIS — N289 Disorder of kidney and ureter, unspecified: Secondary | ICD-10-CM

## 2013-10-08 DIAGNOSIS — R7989 Other specified abnormal findings of blood chemistry: Secondary | ICD-10-CM | POA: Diagnosis present

## 2013-10-08 DIAGNOSIS — M436 Torticollis: Secondary | ICD-10-CM

## 2013-10-08 DIAGNOSIS — R4182 Altered mental status, unspecified: Principal | ICD-10-CM | POA: Diagnosis present

## 2013-10-08 DIAGNOSIS — N400 Enlarged prostate without lower urinary tract symptoms: Secondary | ICD-10-CM | POA: Diagnosis present

## 2013-10-08 DIAGNOSIS — N189 Chronic kidney disease, unspecified: Secondary | ICD-10-CM | POA: Diagnosis present

## 2013-10-08 DIAGNOSIS — I872 Venous insufficiency (chronic) (peripheral): Secondary | ICD-10-CM | POA: Insufficient documentation

## 2013-10-08 DIAGNOSIS — Z7901 Long term (current) use of anticoagulants: Secondary | ICD-10-CM | POA: Insufficient documentation

## 2013-10-08 DIAGNOSIS — L89309 Pressure ulcer of unspecified buttock, unspecified stage: Secondary | ICD-10-CM | POA: Insufficient documentation

## 2013-10-08 DIAGNOSIS — I4891 Unspecified atrial fibrillation: Secondary | ICD-10-CM

## 2013-10-08 DIAGNOSIS — Z8673 Personal history of transient ischemic attack (TIA), and cerebral infarction without residual deficits: Secondary | ICD-10-CM | POA: Insufficient documentation

## 2013-10-08 DIAGNOSIS — Z79899 Other long term (current) drug therapy: Secondary | ICD-10-CM | POA: Insufficient documentation

## 2013-10-08 DIAGNOSIS — I739 Peripheral vascular disease, unspecified: Secondary | ICD-10-CM | POA: Insufficient documentation

## 2013-10-08 DIAGNOSIS — R443 Hallucinations, unspecified: Secondary | ICD-10-CM | POA: Insufficient documentation

## 2013-10-08 DIAGNOSIS — K219 Gastro-esophageal reflux disease without esophagitis: Secondary | ICD-10-CM | POA: Insufficient documentation

## 2013-10-08 DIAGNOSIS — Z96659 Presence of unspecified artificial knee joint: Secondary | ICD-10-CM | POA: Insufficient documentation

## 2013-10-08 DIAGNOSIS — R799 Abnormal finding of blood chemistry, unspecified: Secondary | ICD-10-CM | POA: Insufficient documentation

## 2013-10-08 DIAGNOSIS — L8992 Pressure ulcer of unspecified site, stage 2: Secondary | ICD-10-CM | POA: Insufficient documentation

## 2013-10-08 DIAGNOSIS — I878 Other specified disorders of veins: Secondary | ICD-10-CM | POA: Diagnosis present

## 2013-10-08 DIAGNOSIS — Z8546 Personal history of malignant neoplasm of prostate: Secondary | ICD-10-CM | POA: Insufficient documentation

## 2013-10-08 DIAGNOSIS — I5022 Chronic systolic (congestive) heart failure: Secondary | ICD-10-CM

## 2013-10-08 LAB — ETHANOL: Alcohol, Ethyl (B): <11 mg/dL (ref 0–11)

## 2013-10-08 LAB — URINALYSIS, ROUTINE W REFLEX MICROSCOPIC
Bilirubin Urine: NEGATIVE
Glucose, UA: NEGATIVE mg/dL
Hgb urine dipstick: NEGATIVE
Ketones, ur: NEGATIVE mg/dL
Leukocytes, UA: NEGATIVE
Nitrite: NEGATIVE
Protein, ur: NEGATIVE mg/dL
Specific Gravity, Urine: 1.017 (ref 1.005–1.030)
Urobilinogen, UA: 1 mg/dL (ref 0.0–1.0)
pH: 5.5 (ref 5.0–8.0)

## 2013-10-08 LAB — ACETAMINOPHEN LEVEL: Acetaminophen (Tylenol), Serum: <15 ug/mL (ref 10–30)

## 2013-10-08 LAB — RAPID URINE DRUG SCREEN, HOSP PERFORMED
Amphetamines: NOT DETECTED
Barbiturates: NOT DETECTED
Benzodiazepines: NOT DETECTED
Cocaine: NOT DETECTED
Opiates: NOT DETECTED
Tetrahydrocannabinol: NOT DETECTED

## 2013-10-08 LAB — SALICYLATE LEVEL: Salicylate Lvl: <2 mg/dL — ABNORMAL LOW (ref 2.8–20.0)

## 2013-10-08 LAB — COMPREHENSIVE METABOLIC PANEL
ALT: 20 U/L (ref 0–53)
AST: 44 U/L — ABNORMAL HIGH (ref 0–37)
Albumin: 3.2 g/dL — ABNORMAL LOW (ref 3.5–5.2)
Alkaline Phosphatase: 82 U/L (ref 39–117)
BUN: 20 mg/dL (ref 6–23)
CO2: 26 mEq/L (ref 19–32)
Calcium: 8.7 mg/dL (ref 8.4–10.5)
Chloride: 102 mEq/L (ref 96–112)
Creatinine, Ser: 1.02 mg/dL (ref 0.50–1.35)
GFR calc Af Amer: 75 mL/min — ABNORMAL LOW (ref >90)
GFR calc non Af Amer: 64 mL/min — ABNORMAL LOW (ref >90)
Glucose, Bld: 97 mg/dL (ref 70–99)
Potassium: 3.8 mEq/L (ref 3.7–5.3)
Sodium: 137 mEq/L (ref 137–147)
Total Bilirubin: 0.9 mg/dL (ref 0.3–1.2)
Total Protein: 5.9 g/dL — ABNORMAL LOW (ref 6.0–8.3)

## 2013-10-08 LAB — CBC WITH DIFFERENTIAL/PLATELET
Basophils Absolute: 0 10*3/uL (ref 0.0–0.1)
Basophils Relative: 0 % (ref 0–1)
Eosinophils Absolute: 0 10*3/uL (ref 0.0–0.7)
Eosinophils Relative: 0 % (ref 0–5)
HCT: 35.7 % — ABNORMAL LOW (ref 39.0–52.0)
Hemoglobin: 12 g/dL — ABNORMAL LOW (ref 13.0–17.0)
Lymphocytes Relative: 15 % (ref 12–46)
Lymphs Abs: 0.7 10*3/uL (ref 0.7–4.0)
MCH: 28 pg (ref 26.0–34.0)
MCHC: 33.6 g/dL (ref 30.0–36.0)
MCV: 83.4 fL (ref 78.0–100.0)
Monocytes Absolute: 0.2 10*3/uL (ref 0.1–1.0)
Monocytes Relative: 5 % (ref 3–12)
Neutro Abs: 3.6 10*3/uL (ref 1.7–7.7)
Neutrophils Relative %: 79 % — ABNORMAL HIGH (ref 43–77)
Platelets: 158 10*3/uL (ref 150–400)
RBC: 4.28 MIL/uL (ref 4.22–5.81)
RDW: 15.4 % (ref 11.5–15.5)
WBC: 4.6 10*3/uL (ref 4.0–10.5)

## 2013-10-08 MED ORDER — DOXAZOSIN MESYLATE 2 MG PO TABS
2.0000 mg | ORAL_TABLET | Freq: Every day | ORAL | Status: DC
  Administered 2013-10-09 – 2013-10-11 (×4): 2 mg via ORAL
  Filled 2013-10-08 (×5): qty 1

## 2013-10-08 MED ORDER — FUROSEMIDE 20 MG PO TABS
20.0000 mg | ORAL_TABLET | Freq: Two times a day (BID) | ORAL | Status: DC
  Administered 2013-10-09 – 2013-10-12 (×7): 20 mg via ORAL
  Filled 2013-10-08 (×9): qty 1

## 2013-10-08 MED ORDER — FINASTERIDE 5 MG PO TABS
5.0000 mg | ORAL_TABLET | Freq: Every day | ORAL | Status: DC
  Administered 2013-10-09 – 2013-10-12 (×5): 5 mg via ORAL
  Filled 2013-10-08 (×5): qty 1

## 2013-10-08 MED ORDER — POLYSACCHARIDE IRON COMPLEX 150 MG PO CAPS
150.0000 mg | ORAL_CAPSULE | Freq: Every day | ORAL | Status: DC
  Administered 2013-10-09 – 2013-10-12 (×4): 150 mg via ORAL
  Filled 2013-10-08 (×4): qty 1

## 2013-10-08 MED ORDER — DILTIAZEM HCL ER COATED BEADS 180 MG PO CP24
180.0000 mg | ORAL_CAPSULE | Freq: Every day | ORAL | Status: DC
  Administered 2013-10-09 – 2013-10-12 (×4): 180 mg via ORAL
  Filled 2013-10-08 (×5): qty 1

## 2013-10-08 MED ORDER — PANTOPRAZOLE SODIUM 40 MG PO TBEC
40.0000 mg | DELAYED_RELEASE_TABLET | Freq: Every day | ORAL | Status: DC
  Administered 2013-10-09 – 2013-10-12 (×5): 40 mg via ORAL
  Filled 2013-10-08 (×5): qty 1

## 2013-10-08 MED ORDER — MAGNESIUM OXIDE 400 (241.3 MG) MG PO TABS
400.0000 mg | ORAL_TABLET | Freq: Every day | ORAL | Status: DC
  Administered 2013-10-09 – 2013-10-12 (×5): 400 mg via ORAL
  Filled 2013-10-08 (×5): qty 1

## 2013-10-08 MED ORDER — POTASSIUM CHLORIDE CRYS ER 10 MEQ PO TBCR
10.0000 meq | EXTENDED_RELEASE_TABLET | Freq: Every day | ORAL | Status: DC
  Administered 2013-10-09 – 2013-10-12 (×5): 10 meq via ORAL
  Filled 2013-10-08 (×5): qty 1

## 2013-10-08 MED ORDER — APIXABAN 2.5 MG PO TABS
2.5000 mg | ORAL_TABLET | Freq: Two times a day (BID) | ORAL | Status: DC
  Administered 2013-10-09 – 2013-10-12 (×8): 2.5 mg via ORAL
  Filled 2013-10-08 (×9): qty 1

## 2013-10-08 MED ORDER — DICYCLOMINE HCL 20 MG PO TABS
20.0000 mg | ORAL_TABLET | Freq: Two times a day (BID) | ORAL | Status: DC
  Administered 2013-10-09 – 2013-10-12 (×8): 20 mg via ORAL
  Filled 2013-10-08 (×9): qty 1

## 2013-10-08 NOTE — Progress Notes (Signed)
   CARE MANAGEMENT ED NOTE 10/08/2013  Patient:  Eduardo Deleon, Eduardo Deleon   Account Number:  [id number]  Date Initiated:  10/08/2013  Documentation initiated by:  Jackelyn Poling  Subjective/Objective Assessment:   80 yr old medicare & tricare for life found on floor at home by a friend who said he was hallucinating about being in jail. Pt is alert & oriented & mad about being at the hospital& feels like he is being tricked into being here by POA     Subjective/Objective Assessment Detail:   related to money Pt is very [facility] He immediately apologized to CM shortly after CM entered the room that Hearing aid at home.  Pt noted with neck contracture to left side.  Pt informed CM he has a "god nephew" from [facility] that visits, reports his wife is in snf. States he previously had HHPT but found not to be beneficial for him He states he was ok with mobility as long as he did not turn too quick considering his neck contracture. Reports having private duty nursing (PDN) services [facility] that provides services for laundry, errands, meals, etc Pt voiced preference with PDN vs HH services  pcp Gwendolyn Grant     Action/Plan:   ED CM spoke with ED Sw about voiced concerns of pt living alone, possible home health needs, CM sent [facility] referral via email   Action/Plan Detail:   Returned call from [facility] about pt will attempt to call pt at home number Clarified that is not hallucinating but Very HOH CM spoke with pt about [facility] duty nursing.  CM left lists of home health and PDN services for pt   Anticipated DC Date:       Status Recommendation to Physician:   Result of Recommendation:    Other ED Services  Consult Working Plan   In-house referral  Clinical Social Worker   DC Forensic scientist  Other  Outpatient Services - Pt will follow up  Patient refused services    Choice offered to / List presented to:           [place]    Status of service:   Completed, signed off  ED Comments:   ED Comments Detail:  CM discussed with pt the referral from [facility] for CM and CM community resources

## 2013-10-08 NOTE — Telephone Encounter (Signed)
Spoke with Delia Heady, patient's HCPOA, who is in town from [place]. Was notified that patient signaled his [place] on Sunday 4/11 (was stuck in chair and couldn't get out) and EMS evaluated him but did not see need to transport to hospital. Vicente Males reports increased confusion, with patient stating that he thought someone was in his house, although yesterday seemed more at baseline. This AM, patient was found on the floor hallucinating, stating he was in jail and was very confused. Vicente Males is concerned given the change in his disposition from Sunday til today. Of note, patient does not have overt history of confusion/dementia and does take eliquis.Vicente Males is concerned that patient's blood sugar may be off, and RN informed that sometimes confusion is a SE of a UTI in the elderly. Vicente Males wishes to take patient to ED, but patient refuses saying he just needs to see his [facility] doctor. RN advised that patient he taken to hospital, either voluntarily or with EMS, given this change in disposition, h/o fall and being on anticoagulant. Vicente Males will notify EMS to come evaluate patient and take to ED if necessary. Will call back with further questions.

## 2013-10-08 NOTE — ED Notes (Signed)
Reviewed the falls policy with patient.

## 2013-10-08 NOTE — ED Notes (Signed)
[place] and Jonathon Jordan [phone] is hoping to find placement for Mr Alabi.

## 2013-10-08 NOTE — ED Notes (Signed)
Pt back from CT and in bed.

## 2013-10-08 NOTE — Progress Notes (Addendum)
CSW met with patient at bedside.  Patient presents as alert, oriented x3, and cooperative.  Patient answered all questions appropriately.  CSW provided the patient with ALF and SNF resources for [employer]  CSW explained the differences in the types of facilities.  Patient is aware of the [place] facility as this is where his wife resides.  Patient reports that he would not like to go into a facility at this time because he do not like the facility programming.  He would like to be able to go the bed, watch tv, or eat when ever he wants.  Patient spoke with [facility] about receiving home health services for some assistance in the home.  He reports having had some services with [facility] where an assistant came 3 hours a day but he would accept what ever we have to offered.      Chesley Noon, MSW, [place], 10/08/2013 Evening Clinical Social Worker [phone]

## 2013-10-08 NOTE — ED Notes (Signed)
Family/POA at bedside, EDPA notified in order to update family.

## 2013-10-08 NOTE — ED Notes (Signed)
Pt does not appear to be hallucinating at this time. Pt A&Ox4 and talking with RN about current health condition.

## 2013-10-08 NOTE — ED Notes (Signed)
Pt to CT

## 2013-10-08 NOTE — ED Notes (Signed)
Bed: WA04 Expected date:  Expected time:  Means of arrival:  Comments: EMS/AMS 

## 2013-10-08 NOTE — ED Provider Notes (Signed)
CSN: [id number]     Arrival date & time 10/08/13  1121 History   First MD Initiated Contact with Patient 10/08/13 1143     Chief Complaint  Patient presents with  . Medical Clearance     (Consider location/radiation/quality/duration/timing/severity/associated sxs/prior Treatment) HPI Pt is an 80yo male brought to ED via EMS from home as per triage note from EMS, pt was found on floor by a friend who stated he was hallucinating about being in jail. Pt was sitting in chair upon EMS arrival. In triage, pt alert and oriented  And mad about being at the hospital and feels like he is being tricked into being her so his POA can steal all his money. Per friends, pt has hx of UTIs.  In exam room, pt is awake an alert, hx limited as pt is hard of hearing.  Pt states he feels like he was tried into coming to ED. States he was told he was going to a meeting and EMS brought him here.  Per pt's POA, pt's [name], pt has activated his LifeAlert twice since "Sunday, 4/12. POA is from [place] and came up 3 days ago to check on pt.  Pt was doing well yesterday, but this morning pt was found at home by a relative on the floor hallucinating.  POA called pt's cardiologist who recommended pt be taking to ED for further evaluation.  POA states pt's wife is currently at [facility] nursing home and is wondering if it is time for pt to be placed in a nursing home as POA does not feel pt will be safe living on his own anymore.   Past Medical History  Diagnosis Date  . 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 Atrial fibrillation     chronic anticaog - weintraub  . Congestive heart failure   . Anemia   . Osteoarthritis   . Splenic lesion   . Venous stasis dermatitis     hx venous ulcer/cellulitis 06/2011 R and 04/2011 L  . Prostate cancer   . Anal fissure     Hx of  . Internal hemorrhoids   . Diverticulosis 04/12/1998    Left colon--Flex Sig  . CRI (chronic renal insufficiency)   . GERD (gastroesophageal reflux disease)    . PVD (peripheral vascular disease)   . Torticollis, acquired    Past Surgical History  Procedure Laterality Date  . Joint replacement  bilaterial knees  . Lumbar laminectomy  Per patient  heriated disc in mid spine  . Hernia repair      left-1982, right - 1985  . Hemorrhoid surgery    . Tonsillectomy    . Colonoscopy      19" 24   Family History  Problem Relation Age of Onset  . Heart disease Father   . Breast cancer Mother   . Diabetes Brother   . Diabetes      grandmother  . Colon cancer Neg Hx    History  Substance Use Topics  . Smoking status: Never Smoker   . Smokeless tobacco: Never Used     Comment: married, wife in [place] since 2013  . Alcohol Use: No    Review of Systems  Constitutional: Negative for chills.  All other systems reviewed and are negative.     Allergies  Review of patient's allergies indicates no known allergies.  Home Medications   Prior to Admission medications   Medication Sig Start Date End Date Taking? Authorizing Provider  apixaban (ELIQUIS) 2.5 MG TABS  tablet Take 2.5 mg by mouth 2 (two) times daily. 09/09/13  Yes Troy Sine, MD  cholecalciferol (VITAMIN D) 1000 UNITS tablet Take 2,000 Units by mouth daily.     Yes Historical Provider, MD  dicyclomine (BENTYL) 20 MG tablet Take 20 mg by mouth 2 (two) times daily.   Yes Historical Provider, MD  diltiazem (CARDIZEM CD) 180 MG 24 hr capsule Take 1 capsule (180 mg total) by mouth daily. 04/07/13  Yes Lorretta Harp, MD  doxazosin (CARDURA) 2 MG tablet Take 2 mg by mouth at bedtime.     Yes Historical Provider, MD  Fe Bisgly-Succ-C-Thre-B12-FA (IRON-150 PO) Take 150 mg by mouth daily.    Yes Historical Provider, MD  finasteride (PROSCAR) 5 MG tablet Take 5 mg by mouth daily.   Yes Historical Provider, MD  furosemide (LASIX) 20 MG tablet Take 20 mg by mouth 2 (two) times daily.    Yes Historical Provider, MD  iron polysaccharides (FERREX 150) 150 MG capsule Take 1 capsule (150 mg total)  by mouth daily. 09/25/13  Yes Troy Sine, MD  magnesium oxide (MAG-OXIDE) 400 MG tablet Take 1 tablet (400 mg total) by mouth daily. 04/13/13  Yes Lorretta Harp, MD  omeprazole (PRILOSEC) 20 MG capsule Take 20 mg by mouth daily.   Yes Historical Provider, MD  potassium chloride (KLOR-CON) 10 MEQ CR tablet Take 10 mEq by mouth daily.    Yes Historical Provider, MD   BP 129/72  Pulse 89  Temp(Src) 98.2 F (36.8 C) (Oral)  Resp 18  SpO2 99% Physical Exam  Nursing note and vitals reviewed. Constitutional: He is oriented to person, place, and time. He appears well-developed and well-nourished.  Pt sitting up in exam bed, NAD.   HENT:  Head: Normocephalic and atraumatic.  Right Ear: Decreased hearing is noted.  Left Ear: Decreased hearing is noted.  Eyes: Conjunctivae are normal. No scleral icterus.  Neck:  Left sided torticollis present  Cardiovascular: Normal rate, regular rhythm and normal heart sounds.   Pulmonary/Chest: Effort normal and breath sounds normal. No respiratory distress. He has no wheezes. He has no rales. He exhibits no tenderness.  Abdominal: Soft. Bowel sounds are normal. He exhibits no distension and no mass. There is no tenderness. There is no rebound and no guarding.  Neurological: He is alert and oriented to person, place, and time.  Skin: Skin is warm and dry.  Psychiatric: He has a normal mood and affect. His speech is normal and behavior is normal. He is not actively hallucinating. He expresses no homicidal and no suicidal ideation. He expresses no suicidal plans and no homicidal plans.    ED Course  Procedures (including critical care time) Labs Review Labs Reviewed  URINALYSIS, ROUTINE W REFLEX MICROSCOPIC - Abnormal; Notable for the following:    Color, Urine AMBER (*)    All other components within normal limits  CBC WITH DIFFERENTIAL - Abnormal; Notable for the following:    Hemoglobin 12.0 (*)    HCT 35.7 (*)    Neutrophils Relative % 79 (*)     All other components within normal limits  COMPREHENSIVE METABOLIC PANEL - Abnormal; Notable for the following:    Total Protein 5.9 (*)    Albumin 3.2 (*)    AST 44 (*)    GFR calc non Af Amer 64 (*)    GFR calc Af Amer 75 (*)    All other components within normal limits  SALICYLATE LEVEL - Abnormal; Notable  for the following:    Salicylate Lvl <0.2 (*)    All other components within normal limits  URINE RAPID DRUG SCREEN (HOSP PERFORMED)  ETHANOL  ACETAMINOPHEN LEVEL    Imaging Review Ct Head Wo Contrast  10/08/2013   CLINICAL DATA:  Medical clearance. Found on floor. Hallucinations. Prostate cancer.  EXAM: CT HEAD WITHOUT CONTRAST  TECHNIQUE: Contiguous axial images were obtained from the base of the skull through the vertex without contrast.  COMPARISON:  06/17/2011.  FINDINGS: Advanced atrophy with chronic microvascular ischemic change. No acute stroke or hemorrhage is evident. Calvarium intact. No visible blastic metastases. No acute sinus or mastoid fluid. Similar appearance to priors.  IMPRESSION: Atrophy and chronic microvascular ischemic change. No acute intracranial findings.   Electronically Signed   By: Rolla Flatten M.D.   On: 10/08/2013 14:29     EKG Interpretation None      MDM   Final diagnoses:  None    Pt brought to ED by EMS from home due to reported hallucinations. Family concerned pt cannot live alone anymore.  Medical clearance labs ordered.   UA: no evidence of UTI.  CT head: atrophy and chronic microvascular ischemic change.  No acute intracranial findings.  No medical cause of pt's symptoms identified at this time.  Likely due to worsening dementia.    2:56 PM Consulted with social work who agreed to come see pt and family to help provide resources and possibly set up home health to help with pt until able to be placed in nursing home.    4:01 PM Pt signed out to Margarita Mail, PA-C at shift change. Plan is to f/u with social work to determine  appropriate plan. Pt will likely be discharged home with family.      Noland Fordyce, PA-C 10/08/13 1601

## 2013-10-08 NOTE — ED Notes (Signed)
Dr. Gardener at bedside. 

## 2013-10-08 NOTE — Progress Notes (Signed)
Clinical Social Work Department BRIEF PSYCHOSOCIAL ASSESSMENT 10/08/2013  Patient:  Eduardo Deleon, Eduardo Deleon     Account Number:  [id number]     Admit date:  10/08/2013  Clinical Social Worker:  Luretha Rued  Date/Time:  10/08/2013 04:41 PM  Referred by:  CSW  Date Referred:  10/08/2013  Other Referral:   Interview type:  Patient Other interview type:   CSW attempted to contact the family but was only able to page.    PSYCHOSOCIAL DATA Living Status:  ALONE Admitted from facility:   Level of care:   Primary support name:  Eduardo Deleon Primary support relationship to patient:  FAMILY Degree of support available:   CSW paged the family and is awaiting a contact.    CURRENT CONCERNS  Other Concerns:    SOCIAL WORK ASSESSMENT / PLAN CSW met with patient at bedside.  Patient presents as alert, oriented x3, and cooperative.  Patient answered all questions appropriately.  CSW provided the patient with ALF and SNF resources for [employer]  CSW explained the differences in the types of facilities.  Patient is aware of the [facility] facility as this is where his wife resides.  Patient reports that he would not like to go into a facility at this time because he do not like the facility programming.  He would like to be able to go the bed, watch tv, or eat when ever he wants.  Patient spoke with [facility] about receiving home health services for some assistance in the home.  He reports having had some services with [facility] where an assistant came 3 hours a day but he would accept what ever we have to offered.      CSW was accompanied by [facility] as we attempted to reengage the patient.  Patient presents as lethargic, incoherent, mumbling, no eye contact, and irritable.  The patient's presentation has changed since CSW spoke with him a couple hours ago.  CSW attempted to get in contact with the patient's POA and only able to reach a pager system.  CSW will continue to follow up with the  patient and his needs for potential placement.   Assessment/plan status:  Psychosocial Support/Ongoing Assessment of Needs Other assessment/ plan:   Information/referral to community resources:   SNF, ALF    PATIENT'S/FAMILY'S RESPONSE TO PLAN OF CARE: After the intial conversation the patient expressed his appreciation for the support of the social work department. On the second encounter the patient was inchoherent.       Chesley Noon, MSW, [place], 10/08/2013 Evening Clinical Social Worker [phone]

## 2013-10-08 NOTE — Progress Notes (Signed)
Received referral for [facility] Care Management services. Will follow up with patient post hospital discharge telephonically if he does indeed go home. Made ED RN case manager aware. Marthenia Rolling, MSN- [facility] [phone]

## 2013-10-08 NOTE — ED Notes (Addendum)
Patient is unable to urinate at this time, will try again in thirty minutes.  

## 2013-10-08 NOTE — ED Notes (Signed)
Per EMS. Pt was found on floor at home by a friend and friends said he was hallucinating about being in jail. Sitting in chair upon EMS arival. Pt is alert and oriented and mad about being at the hospital and feels like he is being tricked into being here so his POA can steal all his money. Told by Friends on seen that he has a hx of UTI's. BP 118/80, HR 70, Resp 18 CBG 114 and 100% on Room Air.

## 2013-10-08 NOTE — H&P (Signed)
[facility] History and Physical  Eduardo Deleon NFA:[id number] DOB: [birth year] DOA: 10/08/2013  Referring physician: EDP PCP: Gwendolyn Grant, MD   Chief Complaint: AMS   HPI: Eduardo Deleon is a 80 y.o. male who is sent in by family for 3-4 days of AMS.  AMS is episodic, and consists of patient being somnolent, answering questions incoherently and babbling.  Initially in the ED patient was actually appropriate, competent to make medical decisions.  Medical work up was negative, they had planned to discharge the patient home, but when they went to wake him up at 7 pm his mental status had changed and he was somnolent and babbling.  Review of Systems: Unable to perform due to AMS  Past Medical History  Diagnosis Date  . 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 Atrial fibrillation     chronic anticaog - weintraub  . Congestive heart failure   . Anemia   . Osteoarthritis   . Splenic lesion   . Venous stasis dermatitis     hx venous ulcer/cellulitis 06/2011 R and 04/2011 L  . Prostate cancer   . Anal fissure     Hx of  . Internal hemorrhoids   . Diverticulosis 04/12/1998    Left colon--Flex Sig  . CRI (chronic renal insufficiency)   . GERD (gastroesophageal reflux disease)   . PVD (peripheral vascular disease)   . Torticollis, acquired    Past Surgical History  Procedure Laterality Date  . Joint replacement  bilaterial knees  . Lumbar laminectomy  Per patient  heriated disc in mid spine  . Hernia repair      [phone], right - 1985  . Hemorrhoid surgery    . Tonsillectomy    . Colonoscopy      1988   Social History:  reports that he has never smoked. He has never used smokeless tobacco. He reports that he does not drink alcohol or use illicit drugs.  No Known Allergies  Family History  Problem Relation Age of Onset  . Heart disease Father   . Breast cancer Mother   . Diabetes Brother   . Diabetes      grandmother  . Colon cancer Neg Hx      Prior to  Admission medications   Medication Sig Start Date End Date Taking? Authorizing Provider  apixaban (ELIQUIS) 2.5 MG TABS tablet Take 2.5 mg by mouth 2 (two) times daily. 09/09/13  Yes Troy Sine, MD  cholecalciferol (VITAMIN D) 1000 UNITS tablet Take 2,000 Units by mouth daily.     Yes Historical Provider, MD  dicyclomine (BENTYL) 20 MG tablet Take 20 mg by mouth 2 (two) times daily.   Yes Historical Provider, MD  diltiazem (CARDIZEM CD) 180 MG 24 hr capsule Take 1 capsule (180 mg total) by mouth daily. 04/07/13  Yes Lorretta Harp, MD  doxazosin (CARDURA) 2 MG tablet Take 2 mg by mouth at bedtime.     Yes Historical Provider, MD  Fe Bisgly-Succ-C-Thre-B12-FA (IRON-150 PO) Take 150 mg by mouth daily.    Yes Historical Provider, MD  finasteride (PROSCAR) 5 MG tablet Take 5 mg by mouth daily.   Yes Historical Provider, MD  furosemide (LASIX) 20 MG tablet Take 20 mg by mouth 2 (two) times daily.    Yes Historical Provider, MD  iron polysaccharides (FERREX 150) 150 MG capsule Take 1 capsule (150 mg total) by mouth daily. 09/25/13  Yes Troy Sine, MD  magnesium oxide (MAG-OXIDE) 400 MG tablet Take  1 tablet (400 mg total) by mouth daily. 04/13/13  Yes Lorretta Harp, MD  omeprazole (PRILOSEC) 20 MG capsule Take 20 mg by mouth daily.   Yes Historical Provider, MD  potassium chloride (KLOR-CON) 10 MEQ CR tablet Take 10 mEq by mouth daily.    Yes Historical Provider, MD   Physical Exam: Filed Vitals:   10/08/13 2035  BP: 110/60  Pulse: 81  Temp:   Resp:     BP 110/60  Pulse 81  Temp(Src) 98.2 F (36.8 C) (Oral)  Resp 16  SpO2 97%  General Appearance:    Somnolent, babbling, no distress, appears stated age  Head:    Normocephalic, atraumatic  Eyes:    PERRL, EOMI, sclera non-icteric        Nose:   Nares without drainage or epistaxis. Mucosa, turbinates normal  Throat:   Moist mucous membranes. Oropharynx without erythema or exudate.  Neck:   Torticollis. No carotid bruits.  No  thyromegaly.  No lymphadenopathy.   Back:     No CVA tenderness, no spinal tenderness  Lungs:     Clear to auscultation bilaterally, without wheezes, rhonchi or rales  Chest wall:    No tenderness to palpitation  Heart:    Regular rate and rhythm without murmurs, gallops, rubs  Abdomen:     Soft, non-tender, nondistended, normal bowel sounds, no organomegaly  Genitalia:    deferred  Rectal:    deferred  Extremities:   No clubbing, cyanosis or edema.  Pulses:   2+ and symmetric all extremities  Skin:   Skin color, texture, turgor normal, no rashes or lesions  Lymph nodes:   Cervical, supraclavicular, and axillary nodes normal  Neurologic:   CNII-XII intact. Normal strength, sensation and reflexes      throughout    Labs on Admission:  Basic Metabolic Panel:  Recent Labs Lab 10/08/13 1159  NA 137  K 3.8  CL 102  CO2 26  GLUCOSE 97  BUN 20  CREATININE 1.02  CALCIUM 8.7   Liver Function Tests:  Recent Labs Lab 10/08/13 1159  AST 44*  ALT 20  ALKPHOS 82  BILITOT 0.9  PROT 5.9*  ALBUMIN 3.2*   No results found for this basename: LIPASE, AMYLASE,  in the last 168 hours No results found for this basename: AMMONIA,  in the last 168 hours CBC:  Recent Labs Lab 10/08/13 1159  WBC 4.6  NEUTROABS 3.6  HGB 12.0*  HCT 35.7*  MCV 83.4  PLT 158   Cardiac Enzymes: No results found for this basename: CKTOTAL, CKMB, CKMBINDEX, TROPONINI,  in the last 168 hours  BNP (last 3 results) No results found for this basename: PROBNP,  in the last 8760 hours CBG: No results found for this basename: GLUCAP,  in the last 168 hours  Radiological Exams on Admission: Ct Head Wo Contrast  10/08/2013   CLINICAL DATA:  Medical clearance. Found on floor. Hallucinations. Prostate cancer.  EXAM: CT HEAD WITHOUT CONTRAST  TECHNIQUE: Contiguous axial images were obtained from the base of the skull through the vertex without contrast.  COMPARISON:  06/17/2011.  FINDINGS: Advanced atrophy with  chronic microvascular ischemic change. No acute stroke or hemorrhage is evident. Calvarium intact. No visible blastic metastases. No acute sinus or mastoid fluid. Similar appearance to priors.  IMPRESSION: Atrophy and chronic microvascular ischemic change. No acute intracranial findings.   Electronically Signed   By: Rolla Flatten M.D.   On: 10/08/2013 14:29    EKG: Independently  reviewed.  Assessment/Plan Active Problems:   Altered mental status   1. AMS - unclear as to what is causing this, could just be vascular dementia with "sundowning", but do want to get him admitted for EEG to make sure he isnt having occult seizure in setting of prior stroke.  Mental status is waxing and waning as one would expect from delirium; however, at this point medical work up negative thus far.    Code Status: Full Code  Family Communication: [name] at bedside Disposition Plan: Admit to obs   Time spent: 70 min  [facility] Pager [phone]  If 7AM-7PM, please contact the day team taking care of the patient [email or web address] Password [facility] 10/08/2013, 9:13 PM

## 2013-10-08 NOTE — Progress Notes (Signed)
CSW recideved referral regarding placement. Patient currently lives at home alone, and pt wife is currently in short term rehab at [facility]. Per pt son, POA, patient is unsafe to return home alone at this time. Pt was found hallucinating. Per discussion with PA, patient is confused at times however medically stable for discharge home for placement. CSW to speak with pt and pt son regarding disposition needs.   Noreene Larsson [phone]  ED CSW 10/08/2013 1458pm

## 2013-10-08 NOTE — Progress Notes (Signed)
CSW was accompanied by [facility] as we attempted to reengage the patient.  Patient presents as lethargic, incoherent, mumbling, no eye contact, and irritable.  The patient's presentation has changed since CSW spoke with him a couple hours ago.  CSW attempted to get in contact with the patient's POA and only able to reach a pager system.  CSW will continue to follow up with the patient and his needs for potential placement.       Chesley Noon, MSW, [place], 10/08/2013 Evening Clinical Social Worker [phone]

## 2013-10-08 NOTE — ED Provider Notes (Signed)
4:06 PM BP 129/72  Pulse 89  Temp(Src) 98.2 F (36.8 C) (Oral)  Resp 18  SpO2 99% Assumed care of the patient from [place].  Patient BIB EMS for reproted confusion and hallucinations at home.  In handoff I was toldthe patient was going to be placed in SNF, however in following up with the case manager, the patient appeared to be competent to make his decisions and refused placement at time of assessment.  and had not been placed.  The pateint's labs, CT and UA/UDS negative. Patient ocfiemed to have capacity by previous PA Hilda Blades), Nursing staff and Education officer, museum. On my examination the patient has had an acute change in his mental status. Somnolent, Patient answere questions incohrently  With babbling, nonsensical speech. GCS of 10 (2,3,5) I sternal rubbed the patient and put ammonia under his nose with change in his mental status or LOC.   Filed Vitals:   10/08/13 1900 10/08/13 1930 10/08/13 1949 10/08/13 1950  BP: 96/55 94/58 99/52  99/52  Pulse: 87 76 87   Temp:      TempSrc:      Resp:      SpO2: 95% 96% 96%     I have discussed with Dr. Audie Pinto. Due to negative work up I do not feel this is due to medical cause, Likely sundowning.  Patient will be admitted by Dr. Alcario Drought. He is unsafe for discharge at this time.  The patient appears reasonably stabilized for admission considering the current resources, flow, and capabilities available in the ED at this time, and I doubt any other [facility] requiring further screening and/or treatment in the ED prior to admission.   Margarita Mail, PA-C 10/08/13 2031

## 2013-10-08 NOTE — ED Notes (Signed)
Went into room with PA to assess Pt. Pt no longer able to answer questions. Pt lethargic and difficult to arouse even with ammonia. Pt speaking but incomprehensible.

## 2013-10-08 NOTE — ED Notes (Signed)
Initial contact - pt resting on stretcher, given meal with EDPA verbal ok.  Tolerating well at this time.  Denies further needs/complaints.  NAD.

## 2013-10-08 NOTE — Progress Notes (Signed)
[id number]   CSW spoke with the PA Abigail and she agreed to order PT.    Chesley Noon, MSW, [place], 10/08/2013 Evening Clinical Social Worker [phone]

## 2013-10-09 ENCOUNTER — Observation Stay (HOSPITAL_COMMUNITY)
Admit: 2013-10-09 | Discharge: 2013-10-09 | Disposition: A | Discharge status: Home / Self Care | Payer: Medicare Other | Attending: Internal Medicine | Admitting: Internal Medicine

## 2013-10-09 ENCOUNTER — Other Ambulatory Visit: Payer: Self-pay | Admitting: *Deleted

## 2013-10-09 ENCOUNTER — Observation Stay (HOSPITAL_COMMUNITY): Payer: Medicare Other

## 2013-10-09 ENCOUNTER — Encounter (HOSPITAL_COMMUNITY): Payer: Self-pay | Admitting: *Deleted

## 2013-10-09 DIAGNOSIS — R4182 Altered mental status, unspecified: Secondary | ICD-10-CM

## 2013-10-09 DIAGNOSIS — I4891 Unspecified atrial fibrillation: Secondary | ICD-10-CM

## 2013-10-09 DIAGNOSIS — I509 Heart failure, unspecified: Secondary | ICD-10-CM

## 2013-10-09 DIAGNOSIS — I5022 Chronic systolic (congestive) heart failure: Secondary | ICD-10-CM

## 2013-10-09 DIAGNOSIS — N189 Chronic kidney disease, unspecified: Secondary | ICD-10-CM

## 2013-10-09 LAB — BASIC METABOLIC PANEL
BUN: 20 mg/dL (ref 6–23)
CO2: 25 mEq/L (ref 19–32)
Calcium: 8.3 mg/dL — ABNORMAL LOW (ref 8.4–10.5)
Chloride: 106 mEq/L (ref 96–112)
Creatinine, Ser: 1.13 mg/dL (ref 0.50–1.35)
GFR, EST AFRICAN AMERICAN: 66 mL/min — AB (ref >90)
GFR, EST NON AFRICAN AMERICAN: 57 mL/min — AB (ref >90)
Glucose, Bld: 96 mg/dL (ref 70–99)
POTASSIUM: 3.8 meq/L (ref 3.7–5.3)
SODIUM: 139 meq/L (ref 137–147)

## 2013-10-09 LAB — AMMONIA: AMMONIA: 22 umol/L (ref 11–60)

## 2013-10-09 LAB — CBC
HEMATOCRIT: 34.9 % — AB (ref 39.0–52.0)
Hemoglobin: 11.3 g/dL — ABNORMAL LOW (ref 13.0–17.0)
MCH: 28 pg (ref 26.0–34.0)
MCHC: 32.4 g/dL (ref 30.0–36.0)
MCV: 86.4 fL (ref 78.0–100.0)
PLATELETS: 155 10*3/uL (ref 150–400)
RBC: 4.04 MIL/uL — ABNORMAL LOW (ref 4.22–5.81)
RDW: 15.5 % (ref 11.5–15.5)
WBC: 4 10*3/uL (ref 4.0–10.5)

## 2013-10-09 LAB — TSH: TSH: 2.94 u[IU]/mL (ref 0.350–4.500)

## 2013-10-09 LAB — PRO B NATRIURETIC PEPTIDE: Pro B Natriuretic peptide (BNP): 2371 pg/mL — ABNORMAL HIGH (ref 0–450)

## 2013-10-09 MED ORDER — FUROSEMIDE 20 MG PO TABS: 20.0000 mg | ORAL_TABLET | Freq: Two times a day (BID) | ORAL | Status: DC

## 2013-10-09 MED ORDER — ENSURE COMPLETE PO LIQD
237.0000 mL | Freq: Two times a day (BID) | ORAL | Status: DC
  Administered 2013-10-09 – 2013-10-12 (×7): 237 mL via ORAL

## 2013-10-09 NOTE — Progress Notes (Signed)
INITIAL NUTRITION ASSESSMENT  DOCUMENTATION CODES Per approved criteria  -Not Applicable   INTERVENTION: -Recommend Ensure Complete po BID, each supplement provides 350 kcal and 13 grams of protein -Wife/caregiver would benefit from additional education regarding high protein foods for skin integrity -Modified to Room Service-Not appropriate d/t waning mentation -Will continue to monitor  NUTRITION DIAGNOSIS: Increased nutrient needs (protein/kcal) related to increased demand for nutrients as evidenced by stage 3 ulcer.   Goal: Pt to meet >/= 90% of their estimated nutrition needs    Monitor:  Total protein/kcal intake, labs, weights, skin integrity  Reason for Assessment: MST  80 y.o. male  Admitting Dx: <principal problem not specified>  ASSESSMENT: IKAIKA SHOWERS is a 80 y.o. male who is sent in by family for 3-4 days of AMS. AMS is episodic, and consists of patient being somnolent, answering questions incoherently and babbling. Initially in the ED patient was actually appropriate, competent to make medical decisions. Medical work up was negative, they had planned to discharge the patient home, but when they went to wake him up at 7 pm his mental status had changed and he was somnolent and babbling.  -Pt endorsed gradually decreasing weight over several years. In 2012, pt's usual weight was around 160 lbs, which decreased to 150-155 lbs in one year. -Pt noted he maintained 150 lbs up until one year ago, and eventually dropped weight to 140 lbs. Reported he had weighed 140 lbs for past 2-3 months -Diet recall indicates pt consumes 3 meals/day of microwave dinners and canned foods. Breakfast is typically hot or cold cereal -Pt denied any recent changes in appetite. Assisted pt in ordering breakfast -RN noted pt with stage 3 sacral ulcer on right buttock. WOC consulted -Encouraged pt to consume proteins at each meal for skin integrity. Would also benefit from Ensure Complete  for weight maintanence, skin integreity and muscle mass preservation -Pt interested in learning about ways to add protein into diet. Will revisit education needs once wife/caregiver present  Height: Ht Readings from Last 1 Encounters:  10/09/13 5\' 8"  (1.727 m)    Weight: Wt Readings from Last 1 Encounters:  10/09/13 136 lb 11 oz (62 kg)    Ideal Body Weight: 154 lbs  % Ideal Body Weight: 88%  Wt Readings from Last 10 Encounters:  10/09/13 136 lb 11 oz (62 kg)  09/09/13 140 lb 9.6 oz (63.776 kg)  05/27/13 140 lb 12.8 oz (63.866 kg)  02/22/12 155 lb (70.308 kg)  09/12/11 160 lb (72.576 kg)  08/29/11 157 lb 12.8 oz (71.578 kg)  08/24/11 160 lb (72.576 kg)  06/28/11 172 lb 6.4 oz (78.2 kg)  05/19/11 165 lb (74.844 kg)    Usual Body Weight: 140 lbs  % Usual Body Weight: 97%  BMI:  Body mass index is 20.79 kg/(m^2).  Estimated Nutritional Needs: Kcal: 1850-2050 Protein: 80-90 gram Fluid: >/=2000 ml/daily  Skin: stage 3 buttock ulcer  Diet Order: General  EDUCATION NEEDS: -Education not appropriate at this time   Intake/Output Summary (Last 24 hours) at 10/09/13 1055 Last data filed at 10/09/13 0750  Gross per 24 hour  Intake    240 ml  Output    125 ml  Net    115 ml    Last BM: pta   Labs:   Recent Labs Lab 10/08/13 1159 10/09/13 0458  NA 137 139  K 3.8 3.8  CL 102 106  CO2 26 25  BUN 20 20  CREATININE 1.02 1.13  CALCIUM 8.7  8.3*  GLUCOSE 97 96    CBG (last 3)  No results found for this basename: GLUCAP,  in the last 72 hours  Scheduled Meds: . apixaban  2.5 mg Oral BID  . dicyclomine  20 mg Oral BID  . diltiazem  180 mg Oral Daily  . doxazosin  2 mg Oral QHS  . feeding supplement (ENSURE COMPLETE)  237 mL Oral BID BM  . finasteride  5 mg Oral Daily  . furosemide  20 mg Oral BID  . iron polysaccharides  150 mg Oral Daily  . magnesium oxide  400 mg Oral Daily  . pantoprazole  40 mg Oral Daily  . potassium chloride  10 mEq Oral Daily     Continuous Infusions:   Past Medical History  Diagnosis Date  . 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 Atrial fibrillation     chronic anticaog - weintraub  . Congestive heart failure   . Anemia   . Osteoarthritis   . Splenic lesion   . Venous stasis dermatitis     hx venous ulcer/cellulitis 06/2011 R and 04/2011 L  . Prostate cancer   . Anal fissure     Hx of  . Internal hemorrhoids   . Diverticulosis 04/12/1998    Left colon--Flex Sig  . CRI (chronic renal insufficiency)   . GERD (gastroesophageal reflux disease)   . PVD (peripheral vascular disease)   . Torticollis, acquired     Past Surgical History  Procedure Laterality Date  . Joint replacement  bilaterial knees  . Lumbar laminectomy  Per patient  heriated disc in mid spine  . Hernia repair      [phone], right - 1985  . Hemorrhoid surgery    . Tonsillectomy    . Colonoscopy      S[place]ney LDN Clinical Dietitian [phone]

## 2013-10-09 NOTE — Progress Notes (Signed)
Clinical Social Work  CSW followed up with POA in order to discuss plans. POA has toured ALF and even though PT is recommending SNF placement, POA feels that patient would do well at ALF and prefers placement at [facility] (off [facility]). CSW spoke with POA who requested that CSW contact ALF and sent requested information. CSW spoke with Willy Eddy (cell: [phone]) who reported that CSW could fax information but patient appeared appropriate.   CSW followed up with ALF who reports they do not accept weekend admissions and patient would have to be evaluated prior to admission. CSW relayed this information to MD who reports he will talk with family and evaluate patient. If patient DC over the weekend then patient and family would be responsible for following up on Monday with ALF.   CSW will leave hand off for weekend CSW.  [place], [place] [phone]

## 2013-10-09 NOTE — Progress Notes (Signed)
[facility] PROGRESS NOTE  Eduardo Deleon WUJ:[id number] DOB: [birth year] DOA: 10/08/2013 PCP: Gwendolyn Grant, MD  Assessment/Plan: Active Problems:   A-fib: Rate control. On Eliquis    BPH (benign prostatic hyperplasia): Stable on Proscar    Chronic systolic CHF (congestive heart failure): Will check BNP. Echocardiogram from 2009 shows mild decrease in ejection fraction    Lower extremity venous stasis   Torticollis: Chronic   CRI (chronic renal insufficiency): Looks to be at baseline   Altered mental status: EEG done to rule out underlying seizure disorder. EEG unremarkable. In discussion with Eduardo Deleon, patient lives at home by himself and in mostly stays there. Suspect that he likely has underlying dementia which has been kept in check do to stable living arrangements. For completeness sake, we'll check MRI, TSH and ammonia level. No signs of infection, dehydration, electrolyte abnormalities or drug-induced. Patient's power of attorney is realistic and understands that likely patient will end up needing placement in assisted living facility if workup is otherwise negative for anything acute and reversible  Code Status: Full Code Family Communication: Spoke with Eduardo Deleon (POA) by phone Disposition Plan: continue workup, suspect likely will need placement   Consultants:  None  Procedures:  None  Antibiotics:  None  HPI/Subjective: Pt seen this evening.  Somnolent.  Objective: Filed Vitals:   10/09/13 1500  BP: 103/70  Pulse: 100  Temp: 98 F (36.7 C)  Resp: 16    Intake/Output Summary (Last 24 hours) at 10/09/13 1825 Last data filed at 10/09/13 1501  Gross per 24 hour  Intake    960 ml  Output    325 ml  Net    635 ml   Filed Weights   10/09/13 0022  Weight: 62 kg (136 lb 11 oz)    Exam:   General:  Somnolent,   Cardiovascular: Regular rate and rhythm, S1-S2  Respiratory: Clear to auscultation bilaterally  Abdomen: Soft, nondistended,  positive bowel sounds  Musculoskeletal: No clubbing or cyanosis or edema   Data Reviewed: Basic Metabolic Panel:  Recent Labs Lab 10/08/13 1159 10/09/13 0458  NA 137 139  Deleon 3.8 3.8  CL 102 106  CO2 26 25  GLUCOSE 97 96  BUN 20 20  CREATININE 1.02 1.13  CALCIUM 8.7 8.3*   Liver Function Tests:  Recent Labs Lab 10/08/13 1159  AST 44*  ALT 20  ALKPHOS 82  BILITOT 0.9  PROT 5.9*  ALBUMIN 3.2*   No results found for this basename: LIPASE, AMYLASE,  in the last 168 hours No results found for this basename: AMMONIA,  in the last 168 hours CBC:  Recent Labs Lab 10/08/13 1159 10/09/13 0458  WBC 4.6 4.0  NEUTROABS 3.6  --   HGB 12.0* 11.3*  HCT 35.7* 34.9*  MCV 83.4 86.4  PLT 158 155   Cardiac Enzymes: No results found for this basename: CKTOTAL, CKMB, CKMBINDEX, TROPONINI,  in the last 168 hours BNP (last 3 results) No results found for this basename: PROBNP,  in the last 8760 hours CBG: No results found for this basename: GLUCAP,  in the last 168 hours  No results found for this or any previous visit (from the past 240 hour(s)).   Studies: Ct Head Wo Contrast  10/08/2013   CLINICAL DATA:  Medical clearance. Found on floor. Hallucinations. Prostate cancer.  EXAM: CT HEAD WITHOUT CONTRAST  TECHNIQUE: Contiguous axial images were obtained from the base of the skull through the vertex without contrast.  COMPARISON:  06/17/2011.  FINDINGS:  Advanced atrophy with chronic microvascular ischemic change. No acute stroke or hemorrhage is evident. Calvarium intact. No visible blastic metastases. No acute sinus or mastoid fluid. Similar appearance to priors.  IMPRESSION: Atrophy and chronic microvascular ischemic change. No acute intracranial findings.   Electronically Signed   By: Rolla Flatten M.D.   On: 10/08/2013 14:29    Scheduled Meds: . apixaban  2.5 mg Oral BID  . dicyclomine  20 mg Oral BID  . diltiazem  180 mg Oral Daily  . doxazosin  2 mg Oral QHS  . feeding  supplement (ENSURE COMPLETE)  237 mL Oral BID BM  . finasteride  5 mg Oral Daily  . furosemide  20 mg Oral BID  . iron polysaccharides  150 mg Oral Daily  . magnesium oxide  400 mg Oral Daily  . pantoprazole  40 mg Oral Daily  . potassium chloride  10 mEq Oral Daily   Continuous Infusions:   Active Problems:   A-fib   BPH (benign prostatic hyperplasia)   Chronic systolic CHF (congestive heart failure)   Lower extremity venous stasis   Torticollis   CRI (chronic renal insufficiency)   Altered mental status    Time spent: 25 minutes    Eduardo Deleon [facility] Pager [phone]. If 7PM-7AM, please contact night-coverage at [email or web address], password [facility] 10/09/2013, 6:25 PM  LOS: 1 day

## 2013-10-09 NOTE — Evaluation (Signed)
Physical Therapy Evaluation Patient Details Name: Eduardo Deleon MRN: [id number] DOB: [birth year] Today's Date: 10/09/2013   History of Present Illness    Eduardo Deleon is a 80 y.o. male who is sent in by family for 3-4 days of AMS. AMS is episodic, and consists of patient being somnolent, answering questions incoherently and babbling. Initially in the ED patient was actually appropriate, competent to make medical decisions. Medical work up was negative, they had planned to discharge the patient home, but when they went to wake him up at 7 pm his mental status had changed and he was somnolent and babbling.   Clinical Impression  Pt pleasant and cooperative but requiring min assist for safe mobilization.  Pt would benefit from follow up rehab at SNF level to maximize IND and safety.    Follow Up Recommendations SNF    Equipment Recommendations  None recommended by PT    Recommendations for Other Services       Precautions / Restrictions Precautions Precautions: Fall Restrictions Weight Bearing Restrictions: No      Mobility  Bed Mobility Overal bed mobility: Needs Assistance Bed Mobility: Supine to Sit;Sit to Supine     Supine to sit: Supervision Sit to supine: Supervision      Transfers Overall transfer level: Needs assistance Equipment used: Rolling walker (2 wheeled) Transfers: Sit to/from Stand Sit to Stand: Min guard         General transfer comment: cues for transition positiion and use of UEs to self assist  Ambulation/Gait Ambulation/Gait assistance: Min assist Ambulation Distance (Feet): 250 Feet Assistive device: Rolling walker (2 wheeled);None Gait Pattern/deviations: Step-through pattern;Shuffle;Staggering left;Trunk flexed     General Gait Details: Cues for position from RW and saftey awareness.  Stability deteriorated with ambulation without RW  Stairs            Wheelchair Mobility    Modified Rankin (Stroke Patients Only)       Balance                                             Pertinent Vitals/Pain No c/o pain at this time    Home Living Family/patient expects to be discharged to:: Skilled nursing facility                      Prior Function Level of Independence: Independent with assistive device(s)         Comments: Pt states largely used furntiure to stabilize in home and RW outside     Hand Dominance   Dominant Hand: Right    Extremity/Trunk Assessment   Upper Extremity Assessment: Generalized weakness           Lower Extremity Assessment: RLE deficits/detail;LLE deficits/detail RLE Deficits / Details: Ltd knee flex to 90 LLE Deficits / Details: Ltd knee flex to 90  Cervical / Trunk Assessment: Normal  Communication   Communication: HOH  Cognition Arousal/Alertness: Awake/alert Behavior During Therapy: WFL for tasks assessed/performed Overall Cognitive Status: Within Functional Limits for tasks assessed                      General Comments      Exercises        Assessment/Plan    PT Assessment Patient needs continued PT services  PT Diagnosis Difficulty walking   PT Problem List Decreased strength;Decreased  range of motion;Decreased activity tolerance;Decreased balance;Decreased mobility;Decreased knowledge of use of DME  PT Treatment Interventions DME instruction;Gait training;Stair training;Functional mobility training;Therapeutic activities;Therapeutic exercise;Patient/family education   PT Goals (Current goals can be found in the Care Plan section) Acute Rehab PT Goals Patient Stated Goal: Get back home PT Goal Formulation: With patient Time For Goal Achievement: 10/23/13 Potential to Achieve Goals: Good    Frequency Min 3X/week   Barriers to discharge Decreased caregiver support      Co-evaluation               End of Session Equipment Utilized During Treatment: Gait belt Activity Tolerance: Patient tolerated  treatment well Patient left: in bed;with call bell/phone within reach Nurse Communication: Mobility status    Functional Assessment Tool Used: Clinical judgement Functional Limitation: Mobility: Walking and moving around Mobility: Walking and Moving Around Current Status ([id number]): At least 1 percent but less than 20 percent impaired, limited or restricted Mobility: Walking and Moving Around Goal Status [phone]): At least 1 percent but less than 20 percent impaired, limited or restricted    Time: 1128-1200 PT Time Calculation (min): 32 min   Charges:   PT Evaluation $Initial PT Evaluation Tier I: 1 Procedure PT Treatments $Gait Training: 23-37 mins   PT G Codes:   Functional Assessment Tool Used: Clinical judgement Functional Limitation: Mobility: Walking and moving around    [employer] 10/09/2013, 1:31 PM

## 2013-10-09 NOTE — Telephone Encounter (Signed)
Rx refill sent to patient pharmacy   

## 2013-10-09 NOTE — Progress Notes (Signed)
Pt back from MRI. Unable to perform d/t pt's neck deformity. Will pass on to day shift.

## 2013-10-09 NOTE — Progress Notes (Addendum)
Clinical Social Work Department CLINICAL SOCIAL WORK PLACEMENT NOTE 10/09/2013  Patient:  AYVIN, LIPINSKI  Account Number:  [id number] Admit date:  10/08/2013  Clinical Social Worker:  Sindy Messing, LCSW  Date/time:  10/09/2013 11:15 AM  Clinical Social Work is seeking post-discharge placement for this patient at the following level of care:   SKILLED NURSING   (*CSW will update this form in Epic as items are completed)   10/09/2013  Patient/family provided with [place] Department of Clinical Social Work's list of facilities offering this level of care within the geographic area requested by the patient (or if unable, by the patient's family).  10/09/2013  Patient/family informed of their freedom to choose among providers that offer the needed level of care, that participate in Medicare, Medicaid or managed care program needed by the patient, have an available bed and are willing to accept the patient.  10/09/2013  Patient/family informed of [facility]' ownership interest in [facility], as well as of the fact that they are under no obligation to receive care at this facility.  PASARR submitted to EDS on 10/08/2013 PASARR number received from EDS on 10/08/2013  FL2 transmitted to all facilities in geographic area requested by pt/family on   FL2 transmitted to all facilities within larger geographic area on   Patient informed that his/her managed care company has contracts with or will negotiate with  certain facilities, including the following:     Patient/family informed of bed offers received:   Patient chooses bed at  Physician recommends and patient chooses bed at    Patient to be transferred to  on   Patient to be transferred to facility by   The following physician request were entered in Epic:   Additional Comments:  10/12/13- patient will DC home with [facility].

## 2013-10-09 NOTE — Progress Notes (Signed)
Off site EEG completed at [facility].

## 2013-10-09 NOTE — Progress Notes (Signed)
Clinical Social Work  CSW met with patient and POA [phone]) in order to determine patient's needs. Patient lives alone and wife is at [employer]. POA is very concerned about patient returning home because POA lives in [place] and patient has fallen several times. Patient does not drive and relies on neighbors for assistance as well. POA reports he has already started speaking to SNF and ALF re: placement at [place].  CSW explained that patient is under observation status at this time which means Medicare would not cover SNF stay. POA is aware and reports that he will look into ALF as well because patient could afford to pay privately. CSW provided POA with SNF and ALF lists. CSW will await PT recommendations and will discuss in further detail if patient and POA want SNF or ALF placement.  [place], [place] [phone]

## 2013-10-09 NOTE — Progress Notes (Signed)
UR completed 

## 2013-10-09 NOTE — Procedures (Signed)
History: 80 yo M with a history of altered mental status  Sedation: None  Technique: This is a 17 channel routine scalp EEG performed at the bedside with bipolar and monopolar montages arranged in accordance to the international 10/20 system of electrode placement. One channel was dedicated to EKG recording.    Background: There is a well defined posterior dominant rhythm of 8.5 Hz that attenuates with eye opening. There is some irregular delta associated with drowsiness, and this is present frequently, though during periods of maximal wakefulness this is not present. Marland Kitchen   Photic stimulation: Physiologic driving is not performed  EEG Abnormalities: 1) possible increase in generalized irregular delta activity.   Clinical Interpretation: This normal EEG is recorded in the waking and drowsy state. The possibility of a mild generalized non-specific cerebral dysfunction(encephalopathy) as can be seen with dementia is not completely excluded. There was no seizure or seizure predisposition recorded on this study.   Roland Rack, MD [facility] [phone]  If 7pm- 7am, please page neurology on call as listed in [place].

## 2013-10-10 NOTE — Progress Notes (Addendum)
[facility] PROGRESS NOTE  Eduardo Deleon [id number] DOB: Mar 12, 1925 DOA: 10/08/2013 PCP: Gwendolyn Grant, MD  Interim Summary I have seen and examined patient at bedside and reviewed his chart and also spoke with his Eduardo Deleon over the phone. Eduardo Deleon is a 80 y.o. male who was sent in by family for 3-4 days of AMS described as episodic, with somnolen, answering questions incoherently and babbling. Initially in the ED patient was actually appropriate, competent to make medical decisions. Medical work up was negative, they had planned to discharge the patient home, but when they went to wake him up at 7 pm his mental status had changed and he was somnolent and babbling. CT brain was unremarkable. Patient could not get MRI because of neck deformity. His BNP is elevated therefore will get 2-D echocardiogram. Patient needs 24-hour supervision which he can not get at home. Plan  A-fib: Rate control. On Eliquis  BPH (benign prostatic hyperplasia): Stable on Proscar  Chronic systolic CHF (congestive heart failure): BNP elevated. Echocardiogram from 2009 shows mild decrease in ejection fraction. Obtain echocardiogram and continue Lasix. Lower extremity venous stasis  Torticollis: Chronic  CRI (chronic renal insufficiency): Looks to be at baseline  Altered mental status: EEG done to rule out underlying seizure disorder. EEG unremarkable. In discussion with Eduardo Deleon, patient lives at home by himself and in mostly stays there. Suspect that he likely has underlying dementia which has been kept in check do to stable living arrangements.  TSH and ammonia level normal. No signs of infection, dehydration, electrolyte abnormalities or drug-induced. Patient's power of attorney is realistic and understands that likely patient will end up needing placement in assisted living facility if workup is otherwise negative for anything acute and reversible  Code Status: Full Code  Family Communication: Spoke  with Eduardo Deleon (POA) by phone. Discussed disposition options. Son says he he has found an ALF. Will let case management look into the logistics. Disposition Plan: continue workup, suspect likely will need placement  Consultants:  None Procedures:  None Antibiotics:  None  HPI/Subjective: Says he is ok.  Objective: Filed Vitals:   10/10/13 1337  BP: 103/72  Pulse: 76  Temp: 98.1 F (36.7 C)  Resp: 18    Intake/Output Summary (Last 24 hours) at 10/10/13 1542 Last data filed at 10/10/13 1300  Gross per 24 hour  Intake    480 ml  Output   1400 ml  Net   -920 ml   Filed Weights   10/09/13 0022  Weight: 62 kg (136 lb 11 oz)    Exam:   General:  Hard of hearing  Cardiovascular: RRR. S1S2 normal. No murmurs.  Respiratory: Lungs clear  Abdomen: soft, nontender.  Musculoskeletal: Torticollis of the neck   Data Reviewed: Basic Metabolic Panel:  Recent Labs Lab 10/08/13 1159 10/09/13 0458  NA 137 139  K 3.8 3.8  CL 102 106  CO2 26 25  GLUCOSE 97 96  BUN 20 20  CREATININE 1.02 1.13  CALCIUM 8.7 8.3*   Liver Function Tests:  Recent Labs Lab 10/08/13 1159  AST 44*  ALT 20  ALKPHOS 82  BILITOT 0.9  PROT 5.9*  ALBUMIN 3.2*   No results found for this basename: LIPASE, AMYLASE,  in the last 168 hours  Recent Labs Lab 10/09/13 1850  AMMONIA 22   CBC:  Recent Labs Lab 10/08/13 1159 10/09/13 0458  WBC 4.6 4.0  NEUTROABS 3.6  --   HGB 12.0* 11.3*  HCT 35.7* 34.9*  MCV 83.4 86.4  PLT 158 155   Cardiac Enzymes: No results found for this basename: CKTOTAL, CKMB, CKMBINDEX, TROPONINI,  in the last 168 hours BNP (last 3 results)  Recent Labs  10/09/13 1850  PROBNP 2371.0*   CBG: No results found for this basename: GLUCAP,  in the last 168 hours  No results found for this or any previous visit (from the past 240 hour(s)).   Studies: No results found.  Scheduled Meds: . apixaban  2.5 mg Oral BID  . dicyclomine  20 mg Oral BID  .  diltiazem  180 mg Oral Daily  . doxazosin  2 mg Oral QHS  . feeding supplement (ENSURE COMPLETE)  237 mL Oral BID BM  . finasteride  5 mg Oral Daily  . furosemide  20 mg Oral BID  . iron polysaccharides  150 mg Oral Daily  . magnesium oxide  400 mg Oral Daily  . pantoprazole  40 mg Oral Daily  . potassium chloride  10 mEq Oral Daily   Continuous Infusions:    [facility] [facility] Pager [phone]. If 7PM-7AM, please contact night-coverage at [email or web address], password [email or web address] 10/10/2013, 3:42 PM  LOS: 2 days

## 2013-10-11 ENCOUNTER — Encounter: Payer: Self-pay | Admitting: Cardiovascular Disease

## 2013-10-11 DIAGNOSIS — I059 Rheumatic mitral valve disease, unspecified: Secondary | ICD-10-CM

## 2013-10-11 DIAGNOSIS — R7989 Other specified abnormal findings of blood chemistry: Secondary | ICD-10-CM | POA: Diagnosis present

## 2013-10-11 DIAGNOSIS — C61 Malignant neoplasm of prostate: Secondary | ICD-10-CM | POA: Insufficient documentation

## 2013-10-11 MED ORDER — HYDROCERIN EX CREA
TOPICAL_CREAM | Freq: Two times a day (BID) | CUTANEOUS | Status: DC
  Administered 2013-10-11 – 2013-10-12 (×2): via TOPICAL
  Filled 2013-10-11: qty 113

## 2013-10-11 NOTE — Progress Notes (Signed)
Patient ID: Eduardo Deleon, male   DOB: 09/13/1924, 80 y.o.   MRN: [id number]     PATIENT PROFILE:  Eduardo Deleon presents to the office today to establish cardiology care with me.  He is a former patient of Dr. 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    HPI: Eduardo Deleon is an 80 year old, married gentleman without children, who has a history of sick sinus syndrome.  Recently, his wife has been placed in a nursing home.  He does have a history of irritable bowel syndrome.  In 2009.  He underwent cardioversion for atrial fibrillation and subsequently went back into atrial fibrillation.  He has been treated with Coumadin therapy.  He does have difficulty with transportation as a result has difficulty in checking his protimes.  In October 2010.  He had a negative Myoview for ischemia.  His last echo Doppler study in September 2000 at 15 showed an ejection fraction of 40-45% with atrial fibrillation without significant valvular pathology.  He also has a history of GERD, a history of slow-growing prostate CA followed by Dr. Risa Grill.  He also has a history of irratable bowel syndrome.  He presents for evaluation and establishment of cardiology care with me.  He denies recent chest pain.  He does have permanent atrial fibrillation.  He has been unreliable with protime checks.  Past Medical History  Diagnosis Date  . 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 Atrial fibrillation     chronic anticaog - weintraub  . Congestive heart failure   . Anemia   . Osteoarthritis   . Splenic lesion   . Venous stasis dermatitis     hx venous ulcer/cellulitis 06/2011 R and 04/2011 L  . Prostate cancer   . Anal fissure     Hx of  . Internal hemorrhoids   . Diverticulosis 04/12/1998    Left colon--Flex Sig  . CRI (chronic renal insufficiency)   . GERD (gastroesophageal reflux disease)   . PVD (peripheral vascular disease)   . Torticollis, acquired     Past Surgical History  Procedure  Laterality Date  . Joint replacement  bilaterial knees  . Lumbar laminectomy  Per patient  heriated disc in mid spine  . Hernia repair      [phone], right - 1985  . Hemorrhoid surgery    . Tonsillectomy    . Colonoscopy      1988    No Known Allergies  No current facility-administered medications for this visit.   Current Outpatient Prescriptions  Medication Sig Dispense Refill  . furosemide (LASIX) 20 MG tablet Take 1 tablet (20 mg total) by mouth 2 (two) times daily.  60 tablet  10   Facility-Administered Medications Ordered in Other Visits  Medication Dose Route Frequency Provider Last Rate Last Dose  . apixaban (ELIQUIS) tablet 2.5 mg  2.5 mg Oral BID Etta Quill, DO   2.5 mg at 10/11/13 3790  . dicyclomine (BENTYL) tablet 20 mg  20 mg Oral BID Etta Quill, DO   20 mg at 10/11/13 2409  . diltiazem (CARDIZEM CD) 24 hr capsule 180 mg  180 mg Oral Daily Etta Quill, DO   180 mg at 10/11/13 7353  . doxazosin (CARDURA) tablet 2 mg  2 mg Oral QHS Etta Quill, DO   2 mg at 10/10/13 2314  . feeding supplement (ENSURE COMPLETE) (ENSURE COMPLETE) liquid 237 mL  237 mL Oral BID BM Hazle Coca, RD  237 mL at 10/11/13 0952  . finasteride (PROSCAR) tablet 5 mg  5 mg Oral Daily Etta Quill, DO   5 mg at 10/11/13 7829  . furosemide (LASIX) tablet 20 mg  20 mg Oral BID Etta Quill, DO   20 mg at 10/11/13 5621  . iron polysaccharides (NIFEREX) capsule 150 mg  150 mg Oral Daily Etta Quill, DO   150 mg at 10/11/13 3086  . magnesium oxide (MAG-OX) tablet 400 mg  400 mg Oral Daily Etta Quill, DO   400 mg at 10/11/13 5784  . pantoprazole (PROTONIX) EC tablet 40 mg  40 mg Oral Daily Etta Quill, DO   40 mg at 10/11/13 6962  . potassium chloride SA (K-DUR,KLOR-CON) CR tablet 10 mEq  10 mEq Oral Daily Etta Quill, DO   10 mEq at 10/11/13 9528    @SOCIALHX @  Family History  Problem Relation Age of Onset  . Heart disease Father   . Breast cancer  Mother   . Diabetes Brother   . Diabetes      grandmother  . Colon cancer Neg Hx     ROS is negative for fever chills or night sweats.  He denies change in weight.  He does have difficulty with hearing, particularly left ear.  He denies awareness of lymphadenopathy.  He is unaware of bleeding.  He denies shortness of breath.  He denies chest pain.  He is unaware of palpitations.  He denies blood in stool or urine.  He denies claudication.  He does have slow-growing prostate CA.  He denies significant edema.  He denies rash.  He denies tremor.  He denies difficulty with sleep.  Other comprehensive 14 point system review is negative.  PE BP 120/76  Pulse 99  Ht 5\' 6"  (1.676 m)  Wt 140 lb 9.6 oz (63.776 kg)  BMI 22.70 kg/m2 General: Alert, oriented, no distress.  Skin: normal turgor, no rashes HEENT: Normocephalic, atraumatic. Pupils round and reactive; sclera anicteric; extraocular muscles intact; Fundi ** Nose without nasal septal hypertrophy Mouth/Parynx benign; Mallinpatti scale 2 Neck: No JVD, no carotid bruits; normal carotid upstroke Lungs: clear to ausculatation and percussion; no wheezing or rales Chest wall: without tenderness to palpitation Heart: Irregularly irregular rhythm with a ventricular rate in the 90s., s1 s2 normal 1/6 systolic murmur.  No S3 gallop. Abdomen: soft, nontender; no hepatosplenomehaly, BS+; abdominal aorta nontender and not dilated by palpation. Back: no CVA tenderness Pulses 2+ Extremities: no clubbinbg cyanosis or edema, Homan's sign negative  Neurologic: grossly nonfocal; Cranial nerves grossly wnl Psychologic: Normal mood and affect    ECG (independently read by me): Atrial fibrillation with a ventricular rate in the 90s.  Previously noted T-wave changes anteriorly.  Left axis deviation, with left anterior hemiblock.  LABS:  BMET    Component Value Date/Time   NA 139 10/09/2013 0458   K 3.8 10/09/2013 0458   CL 106 10/09/2013 0458   CO2 25  10/09/2013 0458   GLUCOSE 96 10/09/2013 0458   BUN 20 10/09/2013 0458   CREATININE 1.13 10/09/2013 0458   CREATININE 1.14 03/23/2013 1648   CALCIUM 8.3* 10/09/2013 0458   GFRNONAA 57* 10/09/2013 0458   GFRAA 66* 10/09/2013 0458     Hepatic Function Panel     Component Value Date/Time   PROT 5.9* 10/08/2013 1159   ALBUMIN 3.2* 10/08/2013 1159   AST 44* 10/08/2013 1159   ALT 20 10/08/2013 1159   ALKPHOS 82 10/08/2013 1159  BILITOT 0.9 10/08/2013 1159     CBC    Component Value Date/Time   WBC 4.0 10/09/2013 0458   RBC 4.04* 10/09/2013 0458   RBC 3.99* 05/20/2011 1352   HGB 11.3* 10/09/2013 0458   HCT 34.9* 10/09/2013 0458   PLT 155 10/09/2013 0458   MCV 86.4 10/09/2013 0458   MCH 28.0 10/09/2013 0458   MCHC 32.4 10/09/2013 0458   RDW 15.5 10/09/2013 0458   LYMPHSABS 0.7 10/08/2013 1159   MONOABS 0.2 10/08/2013 1159   EOSABS 0.0 10/08/2013 1159   BASOSABS 0.0 10/08/2013 1159     BNP    Component Value Date/Time   PROBNP 2371.0* 10/09/2013 1850    Lipid Panel  No results found for this basename: chol, trig, hdl, cholhdl, vldl, ldlcalc     RADIOLOGY: Ct Head Wo Contrast  10/08/2013   CLINICAL DATA:  Medical clearance. Found on floor. Hallucinations. Prostate cancer.  EXAM: CT HEAD WITHOUT CONTRAST  TECHNIQUE: Contiguous axial images were obtained from the base of the skull through the vertex without contrast.  COMPARISON:  06/17/2011.  FINDINGS: Advanced atrophy with chronic microvascular ischemic change. No acute stroke or hemorrhage is evident. Calvarium intact. No visible blastic metastases. No acute sinus or mastoid fluid. Similar appearance to priors.  IMPRESSION: Atrophy and chronic microvascular ischemic change. No acute intracranial findings.   Electronically Signed   By: Rolla Flatten M.D.   On: 10/08/2013 14:29     ASSESSMENT AND PLAN: Eduardo Deleon is an 80 year old gentleman with sick sinus syndrome, who now is in permanent atrial fibrillation.  He did undergo  remote cardioversion in 2009, but has developed recurrent atrial fibrillation.  He has been on chronic Coumadin therapy, but unfortunately due to transportation difficulty.  This has been difficult for him to have close followup of his protimes.  His wife is in a nursing home.  I want discussion with him and my recommendation is that warfarin be discontinued.  I recommended starting in an Ellik was 2.5 twice a day, which will allow for anticoagulation without need for protime checks.  He seems to be compensated without CHF symptoms.  He is unaware of his heart rate being irregular.  I am recommending that blood work be checked in the fasting state, consisting of CMP, TSH, CBC, and fasting lipid panel.  I will see him in 6 months for cardiology reevaluation.   Troy Sine, MD, [facility] 10/11/2013 11:44 AM

## 2013-10-11 NOTE — Progress Notes (Signed)
Paged [name] NP ok to leave NSL out for now.  The Pt pulled out NSL, he is taking po's  well, and is not receiving any running fluids through it at this time. Will monitor.  Eduardo Deleon 6:50 AM 10/11/2013

## 2013-10-11 NOTE — Consult Note (Signed)
WOC wound consult note Reason for Consult: evaluation of buttock wound.  Pt very talkative about his LE at the time of my visit.  It was difficult to really talk to him as he will not allow you to speak much, he is extremely HOH this may be why he does not hear me when I try to talk with him. He has a history of venous stasis managed with compression stockings at home. He currently has venous stasis dermatitis but not severe. He has as small lesion on his left pretibial region but they are scabbed over and not open.  He has bilateral hemosiderin staining, but no edema. He reports he is ambulatory with a walker but is in need of a lift chair per his report. I have asked to evaluate his buttock wound. He reports he is not incontinent of bowel or bladder, but does sit in his recliner a great deal of time.   Wound type:  Stage II Pressure ulcer right buttock  Pressure Ulcer POA: Yes Measurement: 4.5cm x 2.5cm x 0.2cm with some superficial skin sloughing Wound bed: pink, moist, some skin peeling  Drainage (amount, consistency, odor) minimal, serosanguinous  Periwound: intact  Dressing procedure/placement/frequency: Silicone foam to protect and insulate the right buttock wound, add silicone foam for the left pretibial lesions per the patient's request (appaerently when he has any type of lesions on the LE he covers with dressing).  Turn and reposition every 2 hours, limit moisture if possible. Chair pressure redistribution pad for when patient up and to be taking at discharge for use.   Discussed POC with patient and bedside nurse.  Re consult if needed, will not follow at this time. Thanks  Jurnie Garritano [place], [place] [phone])

## 2013-10-11 NOTE — Progress Notes (Signed)
  Echocardiogram 2D Echocardiogram has been performed.  Eduardo Deleon 10/11/2013, 1:54 PM

## 2013-10-11 NOTE — Progress Notes (Signed)
[facility] PROGRESS NOTE  TRAYLON SCHIMMING JME:[id number] DOB: 27-May-1925 DOA: 10/08/2013 PCP: Gwendolyn Grant, MD  Interim Summary  Eduardo Deleon is a 80 y.o. male who was sent in by family for 3-4 days of AMS described as episodic, with somnolence, answering questions incoherently and babbling. Initially in the ED patient was actually appropriate, competent to make medical decisions. Medical work up was negative, they had planned to discharge the patient home, but when they went to wake him up at 7 pm his mental status had changed and he was somnolent and babbling. CT brain was unremarkable. Patient could not get MRI because of neck deformity. His BNP is elevated therefore will get 2-D echocardiogram. Patient needs 24-hour supervision which he can not get at home as he lives by himself. I spoke with his [name] by phone [phone]) 10/10/13 to update him on patients condition and discussed disposition options. [name] has identified what he believes to be a suitable ALFe he is afraid that the patient may not support the idea of ALF. Based on interactions with the patient in the last 2 days, it is clear that it will be a challenge for him to continue to manage by himself as he is at high risk of fall. Will therefore defer to case management to finalize suitable disposition. Plan  A-fib: Rate control. On Eliquis  BPH (benign prostatic hyperplasia): Stable on Proscar  Chronic systolic CHF (congestive heart failure): BNP elevated. Echocardiogram from 2009 shows mild decrease in ejection fraction. 2D echocardiogram pending. Continue Lasix, and check electrolytes in am.  Lower extremity venous stasis  Torticollis: Chronic  CRI (chronic renal insufficiency): Looks to be at baseline  Altered mental status: EEG done to rule out underlying seizure disorder. EEG unremarkable. In discussion with [name], patient lives at home by himself and in mostly stays there. Suspect that he  likely has underlying dementia which has been kept in check do to stable living arrangements. TSH and ammonia level normal. No signs of infection, dehydration, electrolyte abnormalities or drug-induced. Patient's power of attorney is realistic and understands that likely patient will end up needing placement in assisted living facility if workup is otherwise negative for anything acute and reversible. Will also check Vitamin B12/RPR level. Code Status: Full Code  Family Communication: Spoke with [name] (POA) by phone. Discussed disposition options. Son says he he has found an ALF. Will let case management look into the logistics.  Disposition Plan: continue workup, suspect likely will need placement  Consultants:  None Procedures:  None Antibiotics:  None  HPI/Subjective: States he feels okay. He is more himself today and denies that he ever fell. He does not believe that he was found on the floor.  Objective: Filed Vitals:   10/11/13 0516  BP: 127/90  Pulse: 90  Temp: 97.9 F (36.6 C)  Resp: 18    Intake/Output Summary (Last 24 hours) at 10/11/13 1218 Last data filed at 10/11/13 1136  Gross per 24 hour  Intake    240 ml  Output   2400 ml  Net  -2160 ml   Filed Weights   10/09/13 0022  Weight: 62 kg (136 lb 11 oz)    Exam:  General: Hard of hearing  Cardiovascular: RRR. S1S2 normal. No murmurs.  Respiratory: Lungs clear  Abdomen: soft, nontender.  Musculoskeletal: Torticollis of the neck   Data Reviewed: Basic Metabolic Panel:  Recent Labs Lab 10/08/13 1159 10/09/13 0458  NA 137 139  K 3.8 3.8  CL 102  106  CO2 26 25  GLUCOSE 97 96  BUN 20 20  CREATININE 1.02 1.13  CALCIUM 8.7 8.3*   Liver Function Tests:  Recent Labs Lab 10/08/13 1159  AST 44*  ALT 20  ALKPHOS 82  BILITOT 0.9  PROT 5.9*  ALBUMIN 3.2*   No results found for this basename: LIPASE, AMYLASE,  in the last 168 hours  Recent Labs Lab 10/09/13 1850  AMMONIA 22   CBC:  Recent  Labs Lab 10/08/13 1159 10/09/13 0458  WBC 4.6 4.0  NEUTROABS 3.6  --   HGB 12.0* 11.3*  HCT 35.7* 34.9*  MCV 83.4 86.4  PLT 158 155   Cardiac Enzymes: No results found for this basename: CKTOTAL, CKMB, CKMBINDEX, TROPONINI,  in the last 168 hours BNP (last 3 results)  Recent Labs  10/09/13 1850  PROBNP 2371.0*   CBG: No results found for this basename: GLUCAP,  in the last 168 hours  No results found for this or any previous visit (from the past 240 hour(s)).   Studies: No results found.  Scheduled Meds: . apixaban  2.5 mg Oral BID  . dicyclomine  20 mg Oral BID  . diltiazem  180 mg Oral Daily  . doxazosin  2 mg Oral QHS  . feeding supplement (ENSURE COMPLETE)  237 mL Oral BID BM  . finasteride  5 mg Oral Daily  . furosemide  20 mg Oral BID  . iron polysaccharides  150 mg Oral Daily  . magnesium oxide  400 mg Oral Daily  . pantoprazole  40 mg Oral Daily  . potassium chloride  10 mEq Oral Daily   Continuous Infusions:   [facility] Pager [phone]. If 7PM-7AM, please contact night-coverage at [email or web address], password [facility] 10/11/2013, 12:18 PM  LOS: 3 days

## 2013-10-12 LAB — BASIC METABOLIC PANEL
BUN: 21 mg/dL (ref 6–23)
CHLORIDE: 101 meq/L (ref 96–112)
CO2: 28 meq/L (ref 19–32)
Calcium: 8.9 mg/dL (ref 8.4–10.5)
Creatinine, Ser: 1.06 mg/dL (ref 0.50–1.35)
GFR calc non Af Amer: 61 mL/min — ABNORMAL LOW (ref >90)
GFR, EST AFRICAN AMERICAN: 71 mL/min — AB (ref >90)
Glucose, Bld: 105 mg/dL — ABNORMAL HIGH (ref 70–99)
Potassium: 4.3 mEq/L (ref 3.7–5.3)
SODIUM: 138 meq/L (ref 137–147)

## 2013-10-12 LAB — CBC
HCT: 36.6 % — ABNORMAL LOW (ref 39.0–52.0)
Hemoglobin: 12.2 g/dL — ABNORMAL LOW (ref 13.0–17.0)
MCH: 28.2 pg (ref 26.0–34.0)
MCHC: 33.3 g/dL (ref 30.0–36.0)
MCV: 84.5 fL (ref 78.0–100.0)
PLATELETS: 158 10*3/uL (ref 150–400)
RBC: 4.33 MIL/uL (ref 4.22–5.81)
RDW: 15.4 % (ref 11.5–15.5)
WBC: 3.9 10*3/uL — ABNORMAL LOW (ref 4.0–10.5)

## 2013-10-12 LAB — RPR

## 2013-10-12 LAB — VITAMIN B12: VITAMIN B 12: 405 pg/mL (ref 211–911)

## 2013-10-12 NOTE — Care Management Note (Unsigned)
    Page 1 of 1   10/12/2013     4:15:23 PM CARE MANAGEMENT NOTE 10/12/2013  Patient:  Eduardo Deleon, Eduardo Deleon   Account Number:  [id number]  Date Initiated:  10/12/2013  Documentation initiated by:  [facility]  Subjective/Objective Assessment:   80 year old male admitted with AMS.     Action/Plan:   From home alone, refuses ALF placement as well as [facility] services.   Anticipated DC Date:  10/12/2013   Anticipated DC Plan:  HOME/SELF CARE  In-house referral  Clinical Social Worker      DC Planning Services  CM consult  Patient refused services      Choice offered to / List presented to:             Status of service:  Completed, signed off Medicare Important Message given?  NA - LOS <3 / Initial given by admissions (If response is "NO", the following Medicare IM given date fields will be blank) Date Medicare IM given:   Date Additional Medicare IM given:    Discharge Disposition:  HOME/SELF CARE  Per UR Regulation:  Reviewed for med. necessity/level of care/duration of stay  If discussed at [place] of Stay Meetings, dates discussed:    Comments:  10/12/13 Allene Dillon RN BSN [phone] Met with pt at bedside to discuss home health services. Pt declined having services set up stating"physical therapy will not help me". He informed me he has servcies form [employer] who come to clean, do laundry, bath him and also cook for him. He stated he has a friend that is going to transport him home.

## 2013-10-12 NOTE — Progress Notes (Signed)
Discharge instructions given to pt, verbalized understanding. Left the unit in stable condition. 

## 2013-10-12 NOTE — Progress Notes (Signed)
Clinical Social Work  CSW discussed case with attending MD who reports patient has capacity to make his own decisions and patient is refusing placement. CSW met with patient who reports he is anxious to get home and has already contacted a family friend [phone]) who will transport him home. CSW spoke with POA Vicente Males) about patient having capacity and refusing placement. POA is concerned about patient returning home and reports that patient has limited support since he lives in [place]. POA reports he wants to talk with MD re: DC plans and reports he will call [employer] to determine if they will pay for placement. CSW explained that even if [employer] agrees to cover SNF that patient is refusing SNF or ALF placement. CSW provided POA with APS contact information if he felt it was needed to make a report. CSW explained that patient does have capacity to make his own decisions at this time and is agreeable for assistance at home. Patient excited about DC home and reports he likes being independent.  CSW will continue to follow in order to assist with DC planning.  [place], [place] [phone]

## 2013-10-12 NOTE — Progress Notes (Signed)
Physical Therapy Treatment Patient Details Name: LAWERNCE EARLL MRN: [id number] DOB: 05/18/1925 Today's Date: 10/12/2013    History of Present Illness AMS    PT Comments    Pt OOB in recliner.  Assisted with amb in hallway with out any AD as pt demon increased cognition and balance/tolerance. Positioned in recliner.  Follow Up Recommendations  SNF     Equipment Recommendations       Recommendations for Other Services       Precautions / Restrictions Precautions Precautions: Fall Restrictions Weight Bearing Restrictions: No    Mobility  Bed Mobility               General bed mobility comments: Pt OOB in recliner  Transfers Overall transfer level: Needs assistance Equipment used: None Transfers: Sit to/from Stand Sit to Stand: Min guard;Min assist         General transfer comment: amb with out RW this session  Ambulation/Gait Ambulation/Gait assistance: Min assist Ambulation Distance (Feet): 125 Feet Assistive device: None Gait Pattern/deviations: Step-through pattern;Trunk flexed Gait velocity: WFL   General Gait Details: increased balance and cognition to point pt tolerated amb with out any AD.   Stairs            Wheelchair Mobility    Modified Rankin (Stroke Patients Only)       Balance                                    Cognition                            Exercises      General Comments        Pertinent Vitals/Pain     Home Living                      Prior Function            PT Goals (current goals can now be found in the care plan section) Progress towards PT goals: Progressing toward goals    Frequency  Min 3X/week    PT Plan      Co-evaluation             End of Session Equipment Utilized During Treatment: Gait belt Activity Tolerance: Patient tolerated treatment well Patient left: in chair;with call bell/phone within reach     Time: 1108-1120 PT Time  Calculation (min): 12 min  Charges:  $Gait Training: 8-22 mins                    G Codes:      Rica Koyanagi  PTA [facility]  Acute  Rehab Pager      [phone]

## 2013-10-12 NOTE — Discharge Summary (Signed)
Physician Discharge Summary  Eduardo Deleon KDT:[id number] DOB: July 27, 1924 DOA: 10/08/2013  PCP: Gwendolyn Grant, MD  Admit date: 10/08/2013 Discharge date: 10/12/2013  Time spent: 25 minutes  Recommendations for Outpatient Follow-up:  1. Patient was offered home health services including PT, OT and aide. He declined. 2. Patient will followup with his cardiologist, Dr. Shelva Majestic in the next 2 weeks  Discharge Diagnoses:  Active Problems:   A-fib   BPH (benign prostatic hyperplasia)   Chronic systolic CHF (congestive heart failure)   Lower extremity venous stasis   Torticollis   CRI (chronic renal insufficiency)   Altered mental status   Elevated brain natriuretic peptide (BNP) level   Discharge Condition: Improved, being discharged home  Diet recommendation: Heart healthy  Filed Weights   10/09/13 0022  Weight: 62 kg (136 lb 11 oz)    History of present illness:  On 4/16 evening:Eduardo Deleon is a 80 y.o. male who is sent in by family for 3-4 days of AMS. AMS is episodic, and consists of patient being somnolent, answering questions incoherently and babbling. Initially in the ED patient was actually appropriate, competent to make medical decisions. Medical work up was negative, they had planned to discharge the patient home, but when they went to wake him up at 7 pm his mental status had changed and he was somnolent and babbling.   Hospital Course:  Active Problems:   A-fib: Patient was noted to be in atrial fibrillation. He is already on eliquis. Is a chronic medical problem. Rate control. He is been in and out of this during his hospitalization.    BPH (benign prostatic hyperplasia): Stable. Continued on Flomax. No urinary retention.    Chronic systolic CHF (congestive heart failure): Stable. BNP checked and found to be within normal limits. Unremarkable.    Lower extremity venous stasis   Torticollis: : Stable. This precluded patient getting MRI, but no  evidence of acute neurological difficulty    CRI (chronic renal insufficiency): Stable. Creatinine on discharge 1.06    Altered mental status: Principal problem. Following hydration and the following day, patient looks to be a bit more alert. Workup included a BNP which is moderately elevated and felt to be more secondary to atrial fibrillation. Patient was hypoxic. TSH and ammonia levels were normal. Unable to get MRI secondary to torticollis. However, the following days, patient was much more alert and appropriate. He is able to talk about his entire medical history. He tells that his home life. He states that he does have some problems with unsteady balance when trying to baby himself, but otherwise does okay. This is not a view showed by his.son who would like patient to be placed. However, am finding no evidence of any mental difficulties and 5 the patient is completely within his own mental capacity. Patient adamantly refuses any form of placement. Initially he was receptive to home health services including PT and OT and aide, however right before discharge, he declined all of these services. I suspect the patient does not live in the most ideal conditions and he does not take very good care of himself at home as he lives alone-wife is in a skilled nursing, however the patient is capable of making his own decisions at this time and declines any sort of placement or assistance, despite his [name]'s Concerns. There Are No Acute Medical Issues to Further Keep the Patient in the Hospital.    Elevated brain natriuretic peptide (BNP) level: Stable. No signs of any  volume overload. Mild elevation at 2371 likely secondary to atrial fibrillation  Procedures:  Echocardiogram done 4/19: Noting atrial fibrillation, but no commentary on diastolic dysfunction. Preserved ejection fraction.  Consultations:  Wound care  Discharge Exam: Filed Vitals:   10/12/13 1353  BP: 110/60  Pulse: 98  Temp: 98.7 F  (37.1 C)  Resp: 18    General: Alert and oriented x3, no acute distress Cardiovascular: Regular rate and rhythm, S1-S2 Respiratory: Clear to auscultation bilaterally  Discharge Instructions You were cared for by a hospitalist during your hospital stay. If you have any questions about your discharge medications or the care you received while you were in the hospital after you are discharged, you can call the unit and asked to speak with the hospitalist on call if the hospitalist that took care of you is not available. Once you are discharged, your primary care physician will handle any further medical issues. Please note that NO REFILLS for any discharge medications will be authorized once you are discharged, as it is imperative that you return to your primary care physician (or establish a relationship with a primary care physician if you do not have one) for your aftercare needs so that they can reassess your need for medications and monitor your lab values.  Discharge Orders   Future Appointments Provider Department Dept Phone   06/02/2014 2:00 PM Rowe Clack, MD [facility] [phone]   Future Orders Complete By Expires   Diet - low sodium heart healthy  As directed    Increase activity slowly  As directed        Medication List         apixaban 2.5 MG Tabs tablet  Commonly known as:  ELIQUIS  Take 2.5 mg by mouth 2 (two) times daily.     cholecalciferol 1000 UNITS tablet  Commonly known as:  VITAMIN D  Take 2,000 Units by mouth daily.     dicyclomine 20 MG tablet  Commonly known as:  BENTYL  Take 20 mg by mouth 2 (two) times daily.     diltiazem 180 MG 24 hr capsule  Commonly known as:  CARDIZEM CD  Take 1 capsule (180 mg total) by mouth daily.     doxazosin 2 MG tablet  Commonly known as:  CARDURA  Take 2 mg by mouth at bedtime.     finasteride 5 MG tablet  Commonly known as:  PROSCAR  Take 5 mg by mouth daily.     furosemide 20  MG tablet  Commonly known as:  LASIX  Take 1 tablet (20 mg total) by mouth 2 (two) times daily.     iron polysaccharides 150 MG capsule  Commonly known as:  FERREX 150  Take 1 capsule (150 mg total) by mouth daily.     IRON-150 PO  Take 150 mg by mouth daily.     magnesium oxide 400 MG tablet  Commonly known as:  MAG-OXIDE  Take 1 tablet (400 mg total) by mouth daily.     omeprazole 20 MG capsule  Commonly known as:  PRILOSEC  Take 20 mg by mouth daily.     potassium chloride 10 MEQ CR tablet  Commonly known as:  KLOR-CON  Take 10 mEq by mouth daily.       No Known Allergies     Follow-up Information   Follow up with KELLY,THOMAS A, MD In 2 weeks.   Specialty:  Cardiology   Contact information:   [address] [place] [address] [phone]        The results of significant diagnostics from this hospitalization (including imaging, microbiology, ancillary and laboratory) are listed below for reference.    Significant Diagnostic Studies: Ct Head Wo Contrast  10/08/2013   CLINICAL DATA:  Medical clearance. Found on floor. Hallucinations. Prostate cancer.  EXAM: CT HEAD WITHOUT CONTRAST  TECHNIQUE: Contiguous axial images were obtained from the base of the skull through the vertex without contrast.  COMPARISON:  06/17/2011.  FINDINGS: Advanced atrophy with chronic microvascular ischemic change. No acute stroke or hemorrhage is evident. Calvarium intact. No visible blastic metastases. No acute sinus or mastoid fluid. Similar appearance to priors.  IMPRESSION: Atrophy and chronic microvascular ischemic change. No acute intracranial findings.   Electronically Signed   By: Rolla Flatten M.D.   On: 10/08/2013 14:29    Microbiology: No results found for this or any previous visit (from the past 240 hour(s)).   Labs: Basic Metabolic Panel:  Recent Labs Lab 10/08/13 1159 10/09/13 0458 10/12/13 0420  NA 137 139 138  K 3.8 3.8 4.3  CL 102 106 101  CO2  26 25 28   GLUCOSE 97 96 105*  BUN 20 20 21   CREATININE 1.02 1.13 1.06  CALCIUM 8.7 8.3* 8.9   Liver Function Tests:  Recent Labs Lab 10/08/13 1159  AST 44*  ALT 20  ALKPHOS 82  BILITOT 0.9  PROT 5.9*  ALBUMIN 3.2*   No results found for this basename: LIPASE, AMYLASE,  in the last 168 hours  Recent Labs Lab 10/09/13 1850  AMMONIA 22   CBC:  Recent Labs Lab 10/08/13 1159 10/09/13 0458 10/12/13 0420  WBC 4.6 4.0 3.9*  NEUTROABS 3.6  --   --   HGB 12.0* 11.3* 12.2*  HCT 35.7* 34.9* 36.6*  MCV 83.4 86.4 84.5  PLT 158 155 158   Cardiac Enzymes: No results found for this basename: CKTOTAL, CKMB, CKMBINDEX, TROPONINI,  in the last 168 hours BNP: BNP (last 3 results)  Recent Labs  10/09/13 1850  PROBNP 2371.0*   CBG: No results found for this basename: GLUCAP,  in the last 168 hours     Signed:  Annita Brod  [facility] 10/12/2013, 7:45 PM

## 2013-10-12 NOTE — Progress Notes (Signed)
Clinical Social Work  CSW received a call from [employer] reporting that he is concerned if patient is released to friend's Vaughan Basta) care. POA reports that he wants guardianship over patient because he does not feel that patient is making the right decision re: DC plans. CSW explained that POA could go through the court system in order to try to gain guardianship but patient would have to be deemed to be incompetent prior to court arranging guardianship. POA reports understanding and reports that he feels that he would not be granted guardianship because patient is alert and oriented at times. POA is frustrated and reports it is not a good plan for patient to DC home and that any other person would feel that patient's home environment is not safe. CSW explained that just because patient is making poor decisions does not mean that the hospital can force patient into placement. CSW encouraged patient to contact APS if he felt strongly that patient was unsafe at home. The only reasons that POA reports why patient's home environment is not safe is because he sleeps in a recliner and the house is not cleaned often. POA does report that patient has occasional care through private agency and that patient is able to cook microwave dinners.  POA reports he wants to talk with MD but that patient cannot be released to friend. CSW once again explained that if patient has capacity then he can make the decision who he wants to leave the hospital with for transportation home. POA reports he has not spoken directly to patient about placement but feels that patient is being more aloof because he knows POA feels ALF would be best option for patient. POA has CSW contact information if further questions arise but is aware that patient wants to DC home when medically stable.   [place], [place] [phone]

## 2013-10-12 NOTE — Progress Notes (Signed)
Sacrum is bright red.  Assisted in turning pt during night but he keeps going back to right side more than left. Alevyn applied to area and cont to encourage him to lean more onto left side of body.

## 2013-10-12 NOTE — ED Provider Notes (Signed)
Medical screening examination/treatment/procedure(s) were conducted as a shared visit with non-physician practitioner(s) and myself.  I personally evaluated the patient during the encounter.   EKG Interpretation None      Pt arrives via ems, friend indicating pt w intermittent hallucinations and paranoid thoughts. On arrival to ed pt alert, conversant. No active/current hallucinations or delusions. Is alert, oriented. Motor/sens intact bil. Labs.   Mirna Mires, MD 10/12/13 [id number]

## 2013-10-19 IMAGING — CR DG FOOT COMPLETE 3+V*L*
3 series · 3 of 3 positions shown · non-contrast
Comparison: None.

CLINICAL DATA: Left foot pain and swelling; left foot infection.
Assess for osteomyelitis.

LEFT FOOT - COMPLETE 3+ VIEW

[t foot ap left]
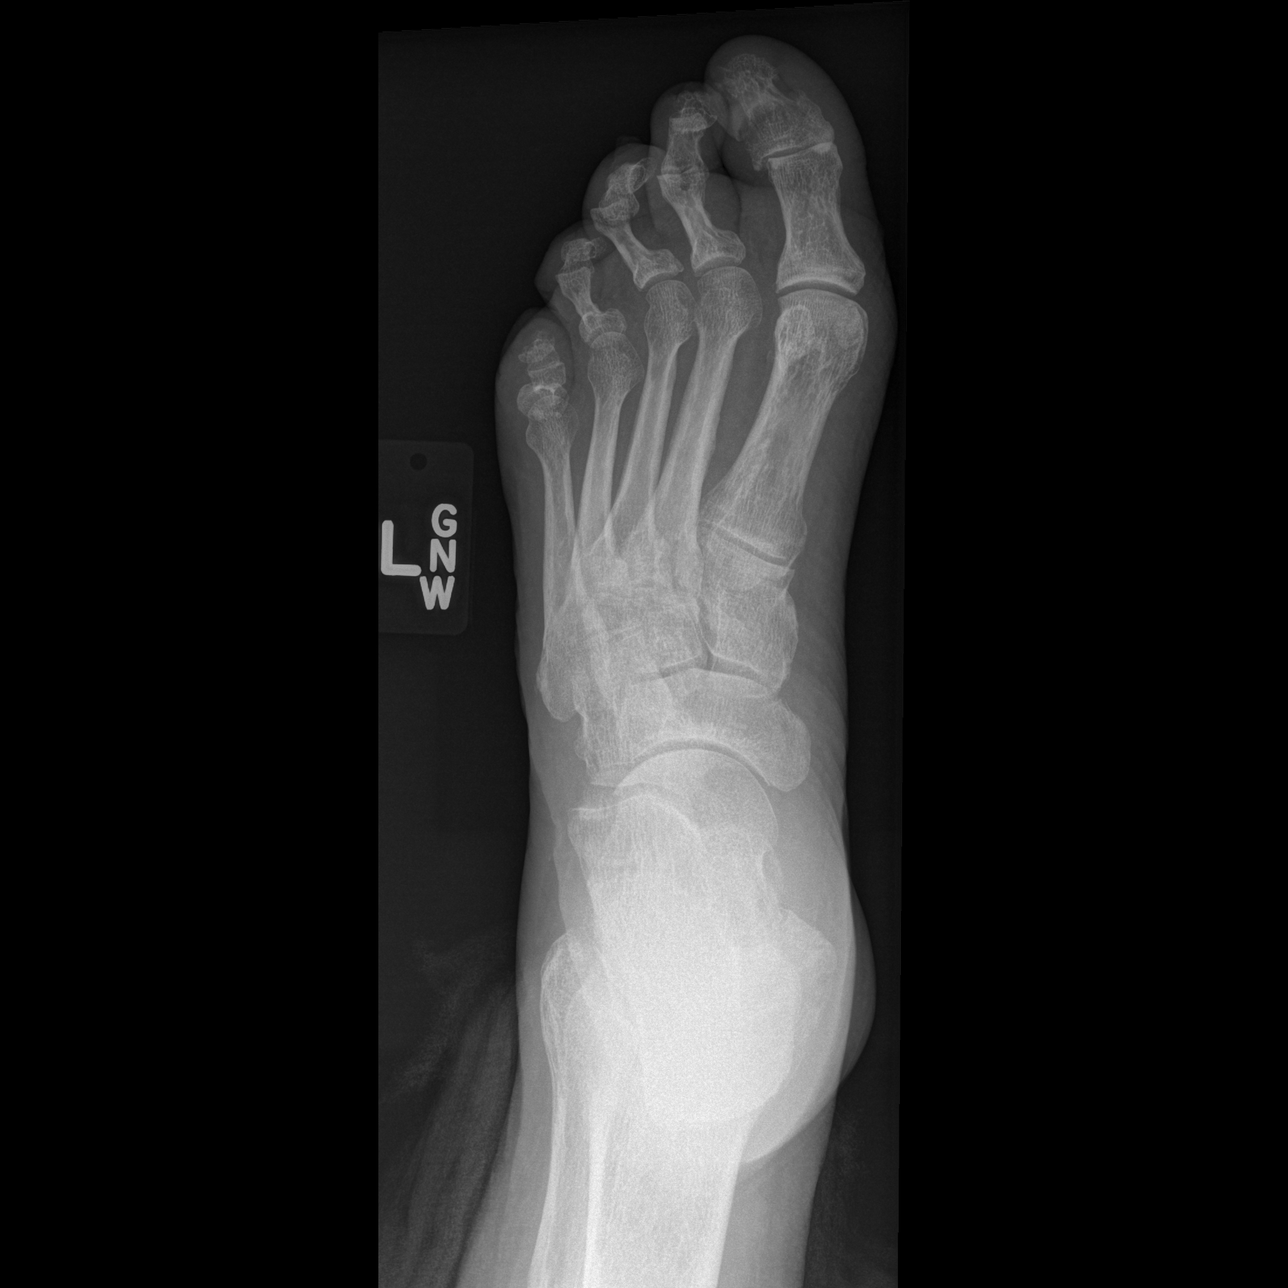

[t foot oblique left *]
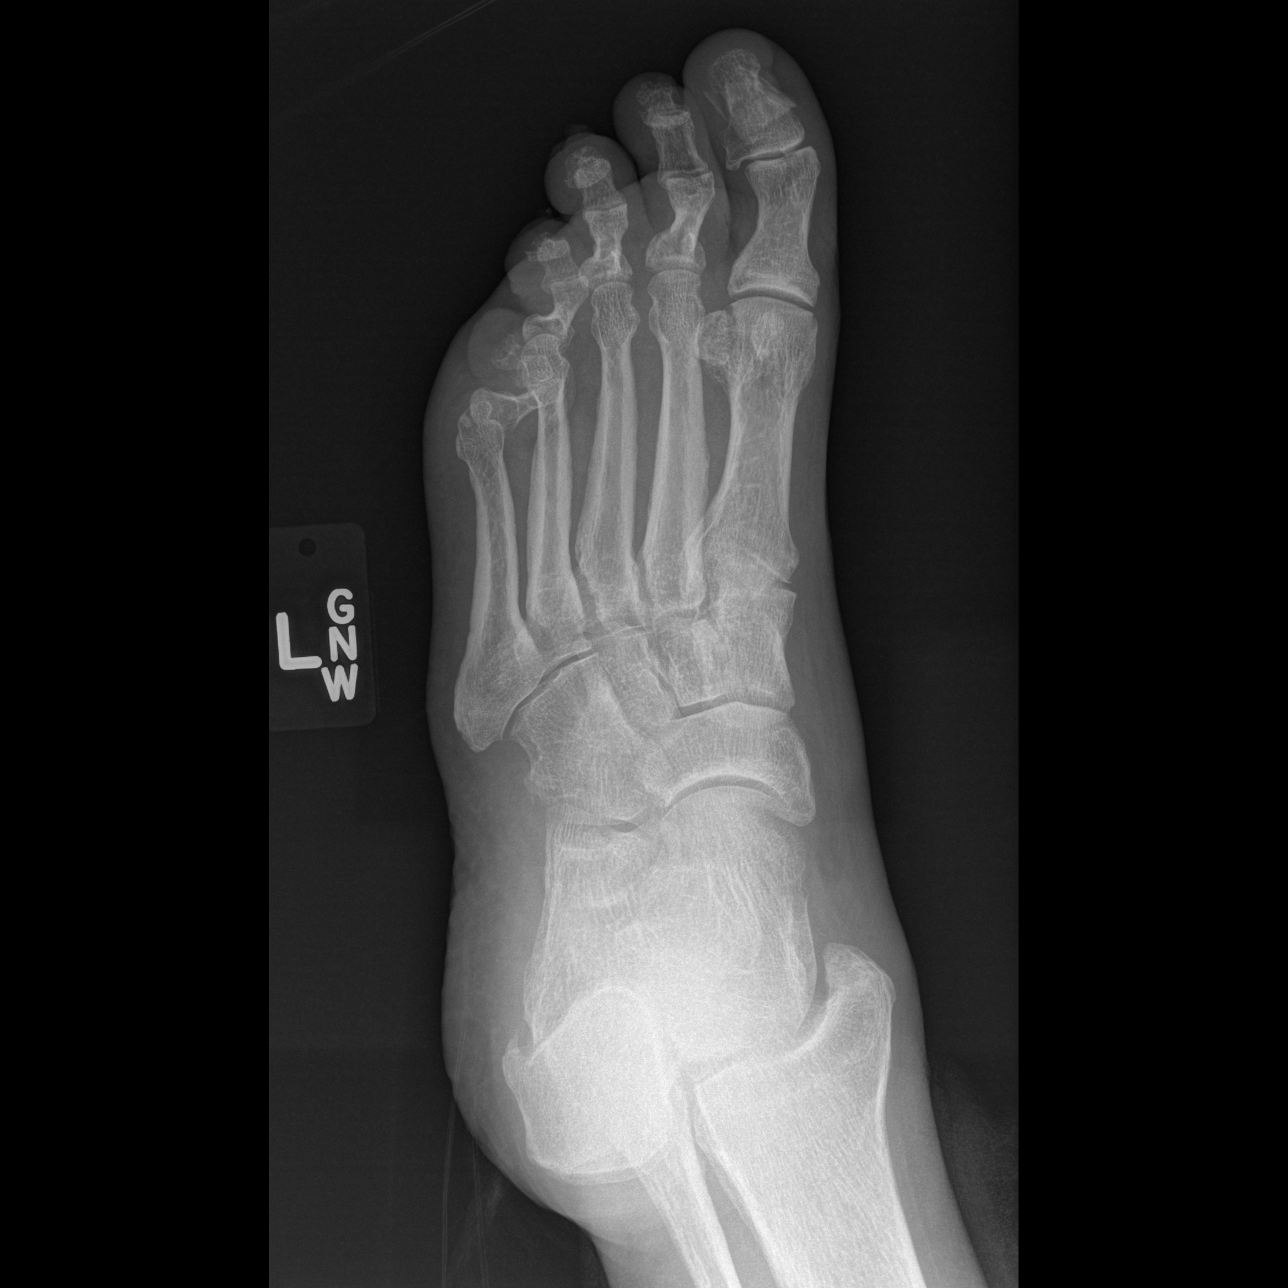

[t foot lat left *]
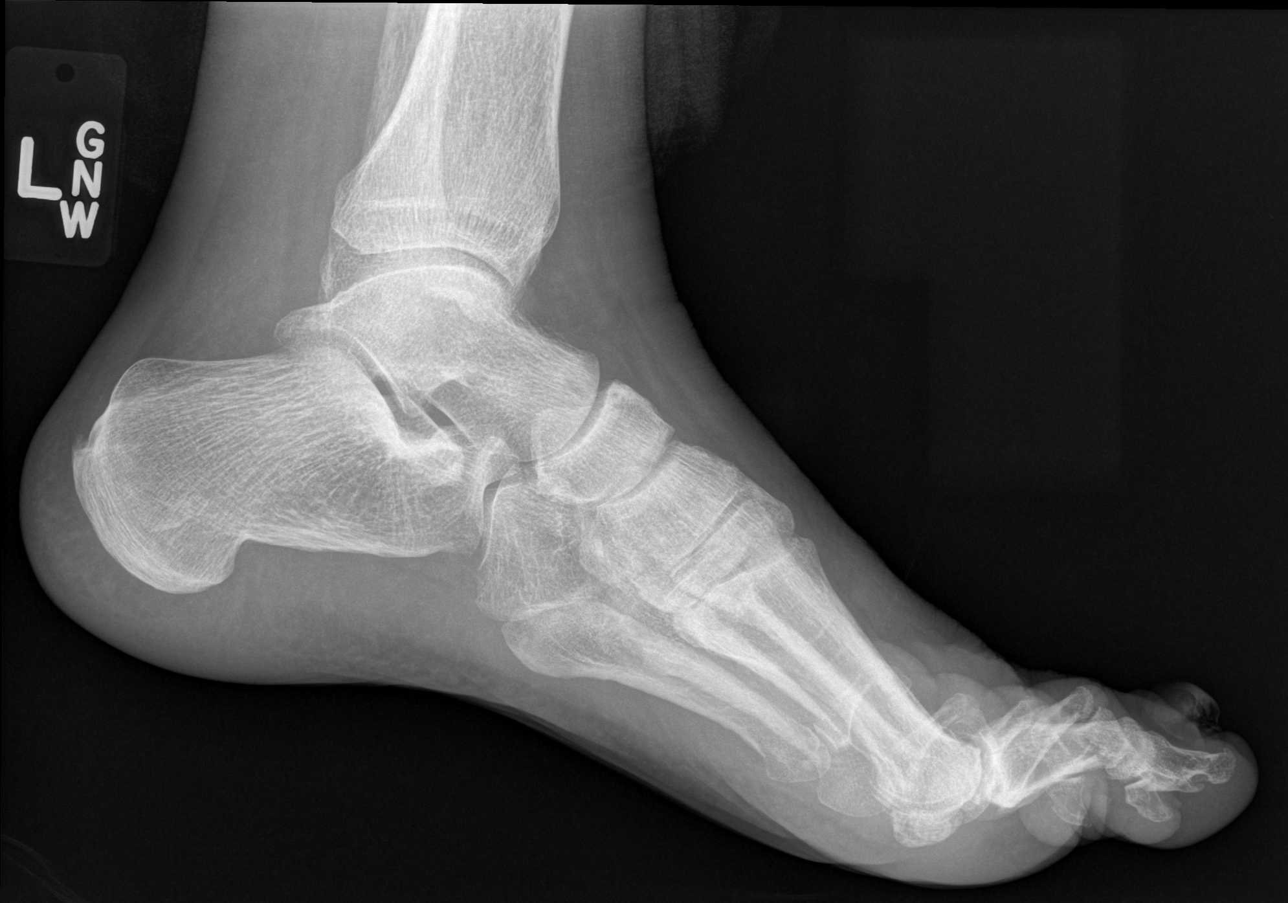

[3 of 3 positions shown; findings below may reference images not displayed]

FINDINGS: There is no evidence of fracture or dislocation.  No
definite osseous erosions are seen to suggest osteomyelitis.  The
joint spaces are preserved.  There is no evidence of talar
subluxation; the subtalar joint is unremarkable in appearance.  A
small posterior calcaneal spur is noted.

No significant soft tissue abnormalities are seen.
IMPRESSION: No osseous erosions seen to suggest osteomyelitis.  No evidence of
fracture or dislocation.  If there is significant clinical concern
for osteomyelitis, MRI could be considered for further evaluation.

## 2013-10-28 ENCOUNTER — Other Ambulatory Visit: Payer: Self-pay | Admitting: Gastroenterology

## 2013-11-02 ENCOUNTER — Telehealth: Payer: Self-pay | Admitting: Internal Medicine

## 2013-11-02 MED ORDER — DICYCLOMINE HCL 20 MG PO TABS: 20.0000 mg | ORAL_TABLET | Freq: Two times a day (BID) | ORAL | Status: DC

## 2013-11-02 NOTE — Telephone Encounter (Signed)
I have spoken to patient. He states he is "disabled" and has no transportation but needs his medication. I advised that this was his case 09/25/13 when we talked and he told me that he would arrange for office visit. Patient states that he will do it this time as I advised that we will give 1 more month rx but that we have to see him at least every 2 years to continue giving medication. Patient verbalizes understanding.

## 2013-11-09 ENCOUNTER — Telehealth: Payer: Self-pay | Admitting: Cardiovascular Disease

## 2013-11-09 NOTE — Telephone Encounter (Signed)
No answer.  RN reviewed patient's last office note from 08/2013- warfarin discontinue and Eliquis started 2.5mg  twice a day.  Yes, patient needed to continue.

## 2013-11-09 NOTE — Telephone Encounter (Signed)
Pt is trying out Eliquis, wants to know if he should continue to take it?

## 2013-11-09 NOTE — Telephone Encounter (Signed)
Spoke to patient   RN informed patient that he was to continue medication ELIQUIS. Patient verbalized understanding. He stated the pharmacy will bring it to him. Patient states he has 4 pills left.  RN verified with [facility] (spoke to [place]).  She states that Mr Sumlin has refills on Eliquis. Ulice Brilliant states they will deliver the medication to patient.

## 2013-11-23 ENCOUNTER — Other Ambulatory Visit: Payer: Self-pay | Admitting: *Deleted

## 2013-11-23 MED ORDER — OMEPRAZOLE 20 MG PO CPDR: 20.0000 mg | DELAYED_RELEASE_CAPSULE | Freq: Every day | ORAL | Status: DC

## 2013-11-23 NOTE — Telephone Encounter (Signed)
Rx was sent to pharmacy electronically. 

## 2013-11-26 ENCOUNTER — Other Ambulatory Visit: Payer: Self-pay | Admitting: Cardiovascular Disease

## 2013-11-27 NOTE — Telephone Encounter (Signed)
Rx refill sent to patient pharmacy   

## 2013-12-07 ENCOUNTER — Telehealth: Payer: Self-pay | Admitting: Internal Medicine

## 2013-12-07 NOTE — Telephone Encounter (Signed)
Pt called in for an appt w/Dr. Olevia Perches. Offered next available which is 01-15-14.  Pt said "I just cannot pick up and come at any time, I do not have family and I have to wait on housekeeping to bring me"  I said sir you have a month to arrange transportation. He declined the appt.

## 2013-12-08 ENCOUNTER — Telehealth: Payer: Self-pay | Admitting: Internal Medicine

## 2013-12-08 MED ORDER — DICYCLOMINE HCL 20 MG PO TABS: 20.0000 mg | ORAL_TABLET | Freq: Two times a day (BID) | ORAL | Status: DC

## 2013-12-08 NOTE — Telephone Encounter (Signed)
Spoke with patient and scheduled OV on 01/05/14 at 2:00 PM. Refilled medications until OV.

## 2013-12-29 ENCOUNTER — Telehealth: Payer: Self-pay | Admitting: Cardiovascular Disease

## 2013-12-29 MED ORDER — APIXABAN 2.5 MG PO TABS: 2.5000 mg | ORAL_TABLET | Freq: Two times a day (BID) | ORAL | Status: DC

## 2013-12-29 NOTE — Telephone Encounter (Signed)
Rx refill sent to patient pharmacy   

## 2013-12-29 NOTE — Telephone Encounter (Signed)
Need a new prescription for Eliquis #90 ?mg and refills.. Please fax this to [facility]-[phone]

## 2013-12-31 ENCOUNTER — Encounter: Payer: Self-pay | Admitting: *Deleted

## 2014-01-05 ENCOUNTER — Ambulatory Visit: Payer: Medicare Other | Admitting: Internal Medicine

## 2014-01-05 ENCOUNTER — Telehealth: Payer: Self-pay | Admitting: Internal Medicine

## 2014-01-05 NOTE — Telephone Encounter (Signed)
Dr Olevia Perches, do you want to charge patient late cancellation fee?

## 2014-01-05 NOTE — Telephone Encounter (Signed)
No charge. 

## 2014-01-12 ENCOUNTER — Ambulatory Visit: Payer: Medicare Other | Admitting: Internal Medicine

## 2014-01-12 ENCOUNTER — Telehealth: Payer: Self-pay | Admitting: Internal Medicine

## 2014-01-12 NOTE — Telephone Encounter (Signed)
Yes, please , charge. He will be dismissed if he misses one more time.

## 2014-01-12 NOTE — Telephone Encounter (Signed)
Dr Olevia Perches, do you want to charge late cancellation fee? Patient cancelled same day last week as well.

## 2014-01-19 ENCOUNTER — Telehealth: Payer: Self-pay | Admitting: Internal Medicine

## 2014-01-19 ENCOUNTER — Telehealth: Payer: Self-pay | Admitting: *Deleted

## 2014-01-19 NOTE — Telephone Encounter (Signed)
I have spoken to the patient at length regarding his bentyl. I explained that we are unable to fill bentyl until he is seen in the office (we have been asking him to come for an appointment since April). He has cancelled 2 appointments same day. While we do understand that the patient has obstacles getting transportation at times, it is also imperative that we see him occasionally to do appropriate exam, labs. Patient argued with me stating that we were just trying to punish him etc. I explained that we are not trying to punish him but we also have policies/rules we must follow as well. We have given refills x 4 months so he could get transportation and this was not accomplished. I told the patient again that we will be glad to fill his prescriptions when we see him on 02/23/14. I then terminated the conversation after telling Eduardo Deleon to have a good day.

## 2014-01-19 NOTE — Telephone Encounter (Signed)
We are unable to provide further refills until the patient is actually seen in the office. We have been telling him he needs an office visit since at least April 2015. He has had 2 office visits that he has not come to. I have attempted to call patient x 2 and have gotten a busy signal. I will attempt to call him back at a later time.

## 2014-01-20 ENCOUNTER — Telehealth: Payer: Self-pay | Admitting: *Deleted

## 2014-01-20 ENCOUNTER — Encounter: Payer: Self-pay | Admitting: *Deleted

## 2014-01-20 NOTE — Telephone Encounter (Signed)
Spoke with patient's POA [name] in reference to his walk in request for a letter stating that patient's mail will need to be delivered to his door due to his inability to go to his mailbox. Informed him that Dr. Claiborne Billings is out of the office and as I recall that once before this was deferred to the patient's PCP. [name] states that he does not know who the patient's PCP is and he is on a time constraint. He will be leaving to return home to [facility]. I told him that I will ask another provider however i cannot make any guarantee since no one else has seen the patient. I will call him tomorrow morning with the decision.

## 2014-01-20 NOTE — Telephone Encounter (Signed)
Notified Mr Lucy Antigua that I spoke with Dr. Peter Martinique (DOD) in reference to a letter for the patient. He agreed to have me give the patient a letter. Letter typed and POA notified the letter is ready for pick up.

## 2014-01-20 NOTE — Telephone Encounter (Signed)
Prescription already refilled.

## 2014-02-23 ENCOUNTER — Encounter: Payer: Self-pay | Admitting: Internal Medicine

## 2014-02-23 ENCOUNTER — Ambulatory Visit (INDEPENDENT_AMBULATORY_CARE_PROVIDER_SITE_OTHER): Payer: Medicare Other | Admitting: Internal Medicine

## 2014-02-23 ENCOUNTER — Other Ambulatory Visit (INDEPENDENT_AMBULATORY_CARE_PROVIDER_SITE_OTHER): Payer: Medicare Other

## 2014-02-23 VITALS — BP 108/60 | HR 88 | Ht 66.0 in | Wt 136.4 lb

## 2014-02-23 DIAGNOSIS — D649 Anemia, unspecified: Secondary | ICD-10-CM

## 2014-02-23 DIAGNOSIS — K589 Irritable bowel syndrome without diarrhea: Secondary | ICD-10-CM

## 2014-02-23 LAB — CBC WITH DIFFERENTIAL/PLATELET
BASOS ABS: 0.1 10*3/uL (ref 0.0–0.1)
BASOS PCT: 1.1 % (ref 0.0–3.0)
EOS ABS: 0.1 10*3/uL (ref 0.0–0.7)
Eosinophils Relative: 1 % (ref 0.0–5.0)
HCT: 37 % — ABNORMAL LOW (ref 39.0–52.0)
HEMOGLOBIN: 12.2 g/dL — AB (ref 13.0–17.0)
Lymphocytes Relative: 31.3 % (ref 12.0–46.0)
Lymphs Abs: 1.7 10*3/uL (ref 0.7–4.0)
MCHC: 33 g/dL (ref 30.0–36.0)
MCV: 90.3 fl (ref 78.0–100.0)
MONOS PCT: 6.2 % (ref 3.0–12.0)
Monocytes Absolute: 0.3 10*3/uL (ref 0.1–1.0)
NEUTROS ABS: 3.3 10*3/uL (ref 1.4–7.7)
NEUTROS PCT: 60.4 % (ref 43.0–77.0)
Platelets: 183 10*3/uL (ref 150.0–400.0)
RBC: 4.1 Mil/uL — AB (ref 4.22–5.81)
RDW: 15.9 % — ABNORMAL HIGH (ref 11.5–15.5)
WBC: 5.4 10*3/uL (ref 4.0–10.5)

## 2014-02-23 MED ORDER — DICYCLOMINE HCL 20 MG PO TABS: 20.0000 mg | ORAL_TABLET | Freq: Two times a day (BID) | ORAL | Status: DC | PRN

## 2014-02-23 NOTE — Progress Notes (Signed)
Eduardo OLVER Sep 15, 1924 [id number]  Note: This dictation was prepared with Dragon digital system. Any transcriptional errors that result from this procedure are unintentional.   History of Present Illness:  This is an 80 year old white male with irritable bowel syndrome who comes for refills of Bentyl 20 mg. He ran out of the medication 2 months ago and has been actually feeling better.Eduardo Deleon He denies any cramps, rectal bleeding or diarrhea. A prior colonoscopy in 1998 showed moderately severe diverticulosis. He underwent a hemorrhoidal banding in 1999 and his last colonoscopy in March 2013 showed a tubular adenoma which was removed. There is no recall scheduled to due to his  age. He has a history of atrial fibrillation and has been on Coumadin. He also has a history of a heart failure and chronic renal insufficiency. He has been on iron supplements and his stools have been dark colored.     Past Medical History  Diagnosis Date  . 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 Atrial fibrillation     chronic anticaog - weintraub  . Congestive heart failure   . Anemia   . Osteoarthritis   . Splenic lesion   . Venous stasis dermatitis     hx venous ulcer/cellulitis 06/2011 R and 04/2011 L  . Prostate cancer   . Anal fissure     Hx of  . Internal hemorrhoids   . Diverticulosis 04/12/1998    Left colon--Flex Sig  . CRI (chronic renal insufficiency)   . GERD (gastroesophageal reflux disease)   . PVD (peripheral vascular disease)   . Torticollis, acquired   . Tubular adenoma     Past Surgical History  Procedure Laterality Date  . Joint replacement  bilaterial knees  . Lumbar laminectomy  Per patient  heriated disc in mid spine  . Hernia repair      [id number], right - 1985  . Hemorrhoid surgery    . Tonsillectomy    . Colonoscopy      1988    No Known Allergies  Family history and social history have been reviewed.  Review of Systems: Denies diarrhea rectal bleeding or abdominal  pain  The remainder of the 10 point ROS is negative except as outlined in the H&P  Physical Exam: General Appearance Well developed, in no distress. Marked deformity of his neck causing lateral deviation of his head rotation to the right Eyes  Non icteric  HEENT  Non traumatic, normocephalic  Mouth No lesion, tongue papillated, no cheilosis Neck Supple without adenopathy, thyroid not enlarged, no carotid bruits, no JVD Lungs Clear to auscultation bilaterally COR Normal S1, normal S2, regular rhythm, no murmur, quiet precordium Abdomen soft nontender with normoactive bowel sounds Rectal normal rectal sphincter tone. Normal perianal area. Dark Hemoccult negative stool Extremities  No pedal edema Skin No lesions Neurological Alert and oriented x 3 Psychological Normal mood and affect  Assessment and Plan:   Problem #37 80 year old white male with irritable bowel syndrome who is doing well off dicyclomine. We will make it available for him on an as necessary basis. No recall colonoscopy is planned due to his age of 57. We will see him only if he develops specific problems. We will be checking his CBC today.to determine if he still needs to continue Iron.    Eduardo Deleon 02/23/2014

## 2014-02-23 NOTE — Patient Instructions (Signed)
We have sent the following medications to your pharmacy for you to pick up at your convenience: Bentyl (as needed)  Your physician has requested that you go to the basement for the following lab work before leaving today: CBC  CC:Dr [employer]

## 2014-03-29 ENCOUNTER — Inpatient Hospital Stay (HOSPITAL_COMMUNITY)
Admission: EM | Admit: 2014-03-29 | Discharge: 2014-04-13 | DRG: 208 | Disposition: A | Discharge status: Skilled Nursing Facility | Payer: Medicare Other | Attending: Internal Medicine | Admitting: Internal Medicine

## 2014-03-29 ENCOUNTER — Encounter (HOSPITAL_COMMUNITY): Payer: Self-pay | Admitting: Emergency Medicine

## 2014-03-29 ENCOUNTER — Emergency Department (HOSPITAL_COMMUNITY): Payer: Medicare Other

## 2014-03-29 DIAGNOSIS — M436 Torticollis: Secondary | ICD-10-CM | POA: Diagnosis present

## 2014-03-29 DIAGNOSIS — Z8673 Personal history of transient ischemic attack (TIA), and cerebral infarction without residual deficits: Secondary | ICD-10-CM | POA: Diagnosis not present

## 2014-03-29 DIAGNOSIS — E876 Hypokalemia: Secondary | ICD-10-CM | POA: Diagnosis present

## 2014-03-29 DIAGNOSIS — M542 Cervicalgia: Secondary | ICD-10-CM | POA: Diagnosis present

## 2014-03-29 DIAGNOSIS — N400 Enlarged prostate without lower urinary tract symptoms: Secondary | ICD-10-CM | POA: Diagnosis present

## 2014-03-29 DIAGNOSIS — Z6821 Body mass index (BMI) 21.0-21.9, adult: Secondary | ICD-10-CM

## 2014-03-29 DIAGNOSIS — Z96653 Presence of artificial knee joint, bilateral: Secondary | ICD-10-CM | POA: Diagnosis present

## 2014-03-29 DIAGNOSIS — H9190 Unspecified hearing loss, unspecified ear: Secondary | ICD-10-CM | POA: Diagnosis present

## 2014-03-29 DIAGNOSIS — N183 Chronic kidney disease, stage 3 unspecified: Secondary | ICD-10-CM

## 2014-03-29 DIAGNOSIS — D696 Thrombocytopenia, unspecified: Secondary | ICD-10-CM | POA: Diagnosis present

## 2014-03-29 DIAGNOSIS — I509 Heart failure, unspecified: Secondary | ICD-10-CM | POA: Diagnosis present

## 2014-03-29 DIAGNOSIS — I4891 Unspecified atrial fibrillation: Secondary | ICD-10-CM

## 2014-03-29 DIAGNOSIS — Z8546 Personal history of malignant neoplasm of prostate: Secondary | ICD-10-CM

## 2014-03-29 DIAGNOSIS — I482 Chronic atrial fibrillation, unspecified: Secondary | ICD-10-CM

## 2014-03-29 DIAGNOSIS — J189 Pneumonia, unspecified organism: Secondary | ICD-10-CM | POA: Diagnosis present

## 2014-03-29 DIAGNOSIS — Z794 Long term (current) use of insulin: Secondary | ICD-10-CM | POA: Diagnosis not present

## 2014-03-29 DIAGNOSIS — N189 Chronic kidney disease, unspecified: Secondary | ICD-10-CM | POA: Diagnosis present

## 2014-03-29 DIAGNOSIS — I5022 Chronic systolic (congestive) heart failure: Secondary | ICD-10-CM

## 2014-03-29 DIAGNOSIS — D649 Anemia, unspecified: Secondary | ICD-10-CM | POA: Diagnosis present

## 2014-03-29 DIAGNOSIS — Z7902 Long term (current) use of antithrombotics/antiplatelets: Secondary | ICD-10-CM | POA: Diagnosis not present

## 2014-03-29 DIAGNOSIS — Z4659 Encounter for fitting and adjustment of other gastrointestinal appliance and device: Secondary | ICD-10-CM

## 2014-03-29 DIAGNOSIS — T380X5A Adverse effect of glucocorticoids and synthetic analogues, initial encounter: Secondary | ICD-10-CM | POA: Diagnosis not present

## 2014-03-29 DIAGNOSIS — J0510 Acute epiglottitis without obstruction: Secondary | ICD-10-CM

## 2014-03-29 DIAGNOSIS — Z7901 Long term (current) use of anticoagulants: Secondary | ICD-10-CM

## 2014-03-29 DIAGNOSIS — R739 Hyperglycemia, unspecified: Secondary | ICD-10-CM | POA: Diagnosis present

## 2014-03-29 DIAGNOSIS — Z66 Do not resuscitate: Secondary | ICD-10-CM | POA: Diagnosis present

## 2014-03-29 DIAGNOSIS — R410 Disorientation, unspecified: Secondary | ICD-10-CM | POA: Diagnosis present

## 2014-03-29 DIAGNOSIS — E870 Hyperosmolality and hypernatremia: Secondary | ICD-10-CM | POA: Diagnosis present

## 2014-03-29 DIAGNOSIS — F419 Anxiety disorder, unspecified: Secondary | ICD-10-CM | POA: Diagnosis present

## 2014-03-29 DIAGNOSIS — R1314 Dysphagia, pharyngoesophageal phase: Secondary | ICD-10-CM

## 2014-03-29 DIAGNOSIS — I739 Peripheral vascular disease, unspecified: Secondary | ICD-10-CM | POA: Diagnosis present

## 2014-03-29 DIAGNOSIS — K219 Gastro-esophageal reflux disease without esophagitis: Secondary | ICD-10-CM | POA: Diagnosis present

## 2014-03-29 DIAGNOSIS — J9600 Acute respiratory failure, unspecified whether with hypoxia or hypercapnia: Principal | ICD-10-CM | POA: Diagnosis present

## 2014-03-29 DIAGNOSIS — I4819 Other persistent atrial fibrillation: Secondary | ICD-10-CM

## 2014-03-29 DIAGNOSIS — J384 Edema of larynx: Secondary | ICD-10-CM | POA: Diagnosis present

## 2014-03-29 DIAGNOSIS — J9601 Acute respiratory failure with hypoxia: Secondary | ICD-10-CM | POA: Diagnosis present

## 2014-03-29 DIAGNOSIS — Z515 Encounter for palliative care: Secondary | ICD-10-CM | POA: Diagnosis not present

## 2014-03-29 DIAGNOSIS — R4182 Altered mental status, unspecified: Secondary | ICD-10-CM

## 2014-03-29 DIAGNOSIS — K5900 Constipation, unspecified: Secondary | ICD-10-CM

## 2014-03-29 LAB — URINALYSIS, ROUTINE W REFLEX MICROSCOPIC
BILIRUBIN URINE: NEGATIVE
Glucose, UA: NEGATIVE mg/dL
Hgb urine dipstick: NEGATIVE
KETONES UR: NEGATIVE mg/dL
Leukocytes, UA: NEGATIVE
Nitrite: NEGATIVE
Protein, ur: NEGATIVE mg/dL
SPECIFIC GRAVITY, URINE: 1.015 (ref 1.005–1.030)
UROBILINOGEN UA: 1 mg/dL (ref 0.0–1.0)
pH: 6.5 (ref 5.0–8.0)

## 2014-03-29 LAB — CBC WITH DIFFERENTIAL/PLATELET
BASOS ABS: 0 10*3/uL (ref 0.0–0.1)
BASOS PCT: 1 % (ref 0–1)
EOS ABS: 0 10*3/uL (ref 0.0–0.7)
Eosinophils Relative: 1 % (ref 0–5)
HCT: 35.2 % — ABNORMAL LOW (ref 39.0–52.0)
Hemoglobin: 11.7 g/dL — ABNORMAL LOW (ref 13.0–17.0)
Lymphocytes Relative: 15 % (ref 12–46)
Lymphs Abs: 0.6 10*3/uL — ABNORMAL LOW (ref 0.7–4.0)
MCH: 29.3 pg (ref 26.0–34.0)
MCHC: 33.2 g/dL (ref 30.0–36.0)
MCV: 88.2 fL (ref 78.0–100.0)
Monocytes Absolute: 0.2 10*3/uL (ref 0.1–1.0)
Monocytes Relative: 5 % (ref 3–12)
NEUTROS PCT: 78 % — AB (ref 43–77)
Neutro Abs: 3.2 10*3/uL (ref 1.7–7.7)
PLATELETS: 121 10*3/uL — AB (ref 150–400)
RBC: 3.99 MIL/uL — ABNORMAL LOW (ref 4.22–5.81)
RDW: 14.7 % (ref 11.5–15.5)
WBC: 4 10*3/uL (ref 4.0–10.5)

## 2014-03-29 LAB — BASIC METABOLIC PANEL
ANION GAP: 12 (ref 5–15)
BUN: 18 mg/dL (ref 6–23)
CHLORIDE: 104 meq/L (ref 96–112)
CO2: 24 mEq/L (ref 19–32)
Calcium: 8.6 mg/dL (ref 8.4–10.5)
Creatinine, Ser: 1.07 mg/dL (ref 0.50–1.35)
GFR, EST AFRICAN AMERICAN: 70 mL/min — AB (ref >90)
GFR, EST NON AFRICAN AMERICAN: 60 mL/min — AB (ref >90)
Glucose, Bld: 106 mg/dL — ABNORMAL HIGH (ref 70–99)
POTASSIUM: 4.4 meq/L (ref 3.7–5.3)
SODIUM: 140 meq/L (ref 137–147)

## 2014-03-29 LAB — STREP PNEUMONIAE URINARY ANTIGEN: STREP PNEUMO URINARY ANTIGEN: NEGATIVE

## 2014-03-29 LAB — GLUCOSE, CAPILLARY
Glucose-Capillary: 136 mg/dL — ABNORMAL HIGH (ref 70–99)
Glucose-Capillary: 99 mg/dL (ref 70–99)

## 2014-03-29 LAB — I-STAT ARTERIAL BLOOD GAS, ED
Acid-base deficit: 1 mmol/L (ref 0.0–2.0)
Bicarbonate: 24.8 mEq/L — ABNORMAL HIGH (ref 20.0–24.0)
O2 Saturation: 97 %
PCO2 ART: 47.2 mmHg — AB (ref 35.0–45.0)
PH ART: 7.329 — AB (ref 7.350–7.450)
Patient temperature: 37
TCO2: 26 mmol/L (ref 0–100)
pO2, Arterial: 93 mmHg (ref 80.0–100.0)

## 2014-03-29 LAB — PROTIME-INR
INR: 1.46 (ref 0.00–1.49)
Prothrombin Time: 17.7 seconds — ABNORMAL HIGH (ref 11.6–15.2)

## 2014-03-29 LAB — MRSA PCR SCREENING: MRSA by PCR: NEGATIVE

## 2014-03-29 LAB — I-STAT TROPONIN, ED: TROPONIN I, POC: 0 ng/mL (ref 0.00–0.08)

## 2014-03-29 MED ORDER — FAMOTIDINE 40 MG/5ML PO SUSR
20.0000 mg | Freq: Two times a day (BID) | ORAL | Status: DC
Start: 2014-03-29 — End: 2014-03-30
  Administered 2014-03-29: 20 mg
  Filled 2014-03-29 (×4): qty 2.5

## 2014-03-29 MED ORDER — DOCUSATE SODIUM 100 MG PO CAPS
100.0000 mg | ORAL_CAPSULE | Freq: Every day | ORAL | Status: DC
  Filled 2014-03-29 (×4): qty 1

## 2014-03-29 MED ORDER — ASPIRIN 81 MG PO CHEW
324.0000 mg | CHEWABLE_TABLET | ORAL | Status: AC
  Administered 2014-03-29: 324 mg via ORAL
  Filled 2014-03-29: qty 4

## 2014-03-29 MED ORDER — MIDAZOLAM HCL 2 MG/2ML IJ SOLN
4.0000 mg | Freq: Once | INTRAMUSCULAR | Status: AC
  Administered 2014-03-29: 2 mg via INTRAVENOUS

## 2014-03-29 MED ORDER — SUCCINYLCHOLINE CHLORIDE 20 MG/ML IJ SOLN
INTRAMUSCULAR | Status: AC
  Administered 2014-03-29: 100 mg
  Filled 2014-03-29: qty 1

## 2014-03-29 MED ORDER — MIDAZOLAM BOLUS VIA INFUSION
1.0000 mg | INTRAVENOUS | Status: DC | PRN
  Administered 2014-03-30: 1 mg via INTRAVENOUS
  Filled 2014-03-29: qty 2

## 2014-03-29 MED ORDER — APIXABAN 2.5 MG PO TABS
2.5000 mg | ORAL_TABLET | Freq: Two times a day (BID) | ORAL | Status: DC
Start: 2014-03-29 — End: 2014-03-30
  Administered 2014-03-29 – 2014-03-30 (×2): 2.5 mg via ORAL
  Filled 2014-03-29 (×4): qty 1

## 2014-03-29 MED ORDER — KCL IN DEXTROSE-NACL 20-5-0.45 MEQ/L-%-% IV SOLN
INTRAVENOUS | Status: DC
  Administered 2014-03-29 – 2014-03-31 (×3): via INTRAVENOUS
  Filled 2014-03-29 (×4): qty 1000

## 2014-03-29 MED ORDER — VITAMIN D3 25 MCG (1000 UNIT) PO TABS
2000.0000 [IU] | ORAL_TABLET | Freq: Every day | ORAL | Status: DC
  Administered 2014-03-29 – 2014-04-13 (×11): 2000 [IU] via ORAL
  Filled 2014-03-29 (×17): qty 2

## 2014-03-29 MED ORDER — IOHEXOL 300 MG/ML  SOLN
75.0000 mL | Freq: Once | INTRAMUSCULAR | Status: AC | PRN
  Administered 2014-03-29: 75 mL via INTRAVENOUS

## 2014-03-29 MED ORDER — FENTANYL BOLUS VIA INFUSION
25.0000 ug | INTRAVENOUS | Status: DC | PRN
  Administered 2014-03-29 – 2014-03-30 (×3): 25 ug via INTRAVENOUS
  Administered 2014-03-30: 50 ug via INTRAVENOUS
  Filled 2014-03-29: qty 50

## 2014-03-29 MED ORDER — PROPOFOL 10 MG/ML IV EMUL
INTRAVENOUS | Status: AC
  Administered 2014-03-29: 5 ug/kg/min via INTRAVENOUS
  Filled 2014-03-29: qty 100

## 2014-03-29 MED ORDER — FENTANYL CITRATE 0.05 MG/ML IJ SOLN
50.0000 ug | Freq: Once | INTRAMUSCULAR | Status: AC
Start: 2014-03-29 — End: 2014-03-29

## 2014-03-29 MED ORDER — FENTANYL CITRATE 0.05 MG/ML IJ SOLN
50.0000 ug | Freq: Once | INTRAMUSCULAR | Status: AC
  Administered 2014-03-29: 50 ug via INTRAVENOUS

## 2014-03-29 MED ORDER — DILTIAZEM HCL 90 MG PO TABS: 45.0000 mg | ORAL_TABLET | Freq: Four times a day (QID) | ORAL | Status: DC

## 2014-03-29 MED ORDER — FINASTERIDE 5 MG PO TABS: 5.0000 mg | ORAL_TABLET | Freq: Every day | ORAL | Status: DC

## 2014-03-29 MED ORDER — PROPOFOL 10 MG/ML IV EMUL
5.0000 ug/kg/min | INTRAVENOUS | Status: DC
  Administered 2014-03-29: 5 ug/kg/min via INTRAVENOUS
  Filled 2014-03-29: qty 100

## 2014-03-29 MED ORDER — DILTIAZEM 12 MG/ML ORAL SUSPENSION
45.0000 mg | Freq: Four times a day (QID) | ORAL | Status: DC
  Administered 2014-03-30 (×4): 45 mg
  Filled 2014-03-29 (×7): qty 6

## 2014-03-29 MED ORDER — ASPIRIN 300 MG RE SUPP: 300.0000 mg | RECTAL | Status: AC

## 2014-03-29 MED ORDER — MIDAZOLAM HCL 2 MG/2ML IJ SOLN
2.0000 mg | Freq: Once | INTRAMUSCULAR | Status: AC
  Administered 2014-03-29: 2 mg via INTRAVENOUS
  Filled 2014-03-29: qty 2

## 2014-03-29 MED ORDER — ROCURONIUM BROMIDE 50 MG/5ML IV SOLN
INTRAVENOUS | Status: AC
  Filled 2014-03-29: qty 2

## 2014-03-29 MED ORDER — POLYSACCHARIDE IRON COMPLEX 150 MG PO CAPS: 150.0000 mg | ORAL_CAPSULE | Freq: Every day | ORAL | Status: DC

## 2014-03-29 MED ORDER — CHLORHEXIDINE GLUCONATE 0.12 % MT SOLN
15.0000 mL | Freq: Two times a day (BID) | OROMUCOSAL | Status: DC
  Administered 2014-03-29 – 2014-04-13 (×25): 15 mL via OROMUCOSAL
  Filled 2014-03-29 (×31): qty 15

## 2014-03-29 MED ORDER — FENTANYL CITRATE 0.05 MG/ML IJ SOLN
0.0000 ug/h | INTRAMUSCULAR | Status: DC
  Administered 2014-03-30: 100 ug/h via INTRAVENOUS
  Filled 2014-03-29 (×2): qty 50

## 2014-03-29 MED ORDER — ETOMIDATE 2 MG/ML IV SOLN
INTRAVENOUS | Status: AC
  Administered 2014-03-29: 10 mg
  Filled 2014-03-29: qty 20

## 2014-03-29 MED ORDER — FENTANYL CITRATE 0.05 MG/ML IJ SOLN
INTRAMUSCULAR | Status: AC
  Filled 2014-03-29: qty 2

## 2014-03-29 MED ORDER — CETYLPYRIDINIUM CHLORIDE 0.05 % MT LIQD
7.0000 mL | Freq: Four times a day (QID) | OROMUCOSAL | Status: DC
  Administered 2014-03-30 – 2014-04-01 (×12): 7 mL via OROMUCOSAL

## 2014-03-29 MED ORDER — SODIUM CHLORIDE 0.9 % IV SOLN
0.0000 mg/h | INTRAVENOUS | Status: DC
  Administered 2014-03-29: 1 mg/h via INTRAVENOUS
  Filled 2014-03-29 (×2): qty 10

## 2014-03-29 MED ORDER — MORPHINE SULFATE 4 MG/ML IJ SOLN
4.0000 mg | Freq: Once | INTRAMUSCULAR | Status: AC
  Administered 2014-03-29: 4 mg via INTRAVENOUS
  Filled 2014-03-29: qty 1

## 2014-03-29 MED ORDER — SODIUM CHLORIDE 0.9 % IV SOLN
3.0000 g | Freq: Once | INTRAVENOUS | Status: DC
  Filled 2014-03-29: qty 3

## 2014-03-29 MED ORDER — DOXAZOSIN MESYLATE 2 MG PO TABS
2.0000 mg | ORAL_TABLET | Freq: Every day | ORAL | Status: DC
  Administered 2014-03-29 – 2014-04-12 (×9): 2 mg via ORAL
  Filled 2014-03-29 (×18): qty 1

## 2014-03-29 MED ORDER — SODIUM CHLORIDE 0.9 % IV SOLN
250.0000 mL | INTRAVENOUS | Status: DC | PRN
  Administered 2014-04-01 – 2014-04-06 (×2): 250 mL via INTRAVENOUS

## 2014-03-29 MED ORDER — SODIUM CHLORIDE 0.9 % IV SOLN
3.0000 g | Freq: Four times a day (QID) | INTRAVENOUS | Status: DC
  Administered 2014-03-29 – 2014-03-31 (×9): 3 g via INTRAVENOUS
  Filled 2014-03-29 (×11): qty 3

## 2014-03-29 MED ORDER — LIDOCAINE HCL (CARDIAC) 20 MG/ML IV SOLN
INTRAVENOUS | Status: AC
  Filled 2014-03-29: qty 5

## 2014-03-29 NOTE — H&P (Signed)
PULMONARY / CRITICAL CARE MEDICINE   Name: Eduardo Deleon MRN: [id number] DOB: 16-Jul-1924    ADMISSION DATE:  03/29/2014 CONSULTATION DATE:  03/29/14  REFERRING MD :  ED physician  CHIEF COMPLAINT:  Neck pain  INITIAL PRESENTATION: 80 yo M with PMH of CVA, Afib, CHF, IBS, GERD, and torticollis presented to ED 10/5 via EMS with neck pain that radiates to his jaw, emergently intubated by anesthesia due to respiratory failure following administration of pain medication.    STUDIES:  10/5  CT head and neck - B pleural effusions, R>L, severe torticollis, no mention of abscess, difficult exam due to anatomy  SIGNIFICANT EVENTS: 10/5  Presented to ED with neck pain radiating to jaw.  Medicated for pain, then had progressive distress.  Intubated in ER by Anesthesia      HISTORY OF PRESENT ILLNESS:  80 y/o M with a PMH of CVA, Afib (on Eliquis), CHF (EF 60-65% 09/2013), prostate ca, IBS, CKD (baseline sr cr ~1), GERD, & torticollis who presented to the ED via EMS on 105 with complaints of acute onset L sided neck pain which radiated to the jaw. He reported difficulty swallowing and and change in his voice quality (hoarse).  He was administered 4mg  morphine for pain.  After pain medication, the nurse noted a drop in his O2 sats to 78-84%.  ER staff report that he did not appear distressed from morphine administration.  Pt was noted to have tachycadia and was having difficulty speaking in full sentences due to respiratory distress.  Anesthesia was called for intubation due to pt having difficult airway because of severe torticollis.  Anesthesia notes relfect a swollen epiglottis, but was intubated without complications.  Labs were essentially normal in the ER.  CXR concerning for L infiltrate.  PCCM called for ICU admission.    At baseline, he lives at home alone.  His wife is in a SNF.  He does have a POA but no family available at time of admission.   PAST MEDICAL HISTORY :   has a past medical  history of Stroke (1978 lower brain stem); Atrial fibrillation; Congestive heart failure; Anemia; Osteoarthritis; Splenic lesion; Venous stasis dermatitis; Prostate cancer; Anal fissure; Internal hemorrhoids; Diverticulosis (04/12/1998); CRI (chronic renal insufficiency); GERD (gastroesophageal reflux disease); PVD (peripheral vascular disease); Torticollis, acquired; and Tubular adenoma.  has past surgical history that includes Joint replacement (bilaterial knees); Lumbar laminectomy (Per patient  heriated disc in mid spine); Hernia repair; Hemorrhoid surgery; Tonsillectomy; and Colonoscopy.  HOME MEDICATIONS: Prior to Admission medications   Medication Sig Start Date End Date Taking? Authorizing Provider  apixaban (ELIQUIS) 2.5 MG TABS tablet Take 1 tablet (2.5 mg total) by mouth 2 (two) times daily. 12/29/13  Yes Troy Sine, MD  cholecalciferol (VITAMIN D) 1000 UNITS tablet Take 2,000 Units by mouth daily.     Yes Historical Provider, MD  diltiazem (CARDIZEM CD) 180 MG 24 hr capsule Take 1 capsule (180 mg total) by mouth daily. 04/07/13  Yes Lorretta Harp, MD  doxazosin (CARDURA) 2 MG tablet Take 2 mg by mouth at bedtime.     Yes Historical Provider, MD  finasteride (PROSCAR) 5 MG tablet Take 5 mg by mouth daily.   Yes Historical Provider, MD  furosemide (LASIX) 20 MG tablet Take 20 mg by mouth daily.   Yes Historical Provider, MD  iron polysaccharides (FERREX 150) 150 MG capsule Take 1 capsule (150 mg total) by mouth daily. 09/25/13  Yes Troy Sine, MD  magnesium  oxide (MAG-OX) 400 MG tablet Take 400 mg by mouth daily.   Yes Historical Provider, MD  omeprazole (PRILOSEC) 20 MG capsule Take 1 capsule (20 mg total) by mouth daily. 11/23/13  Yes Troy Sine, MD  potassium chloride (KLOR-CON) 10 MEQ CR tablet Take 10 mEq by mouth daily.    Yes Historical Provider, MD   No Known Allergies  FAMILY HISTORY:  indicated that his mother is deceased. He indicated that his father is alive.    SOCIAL HISTORY:  reports that he has never smoked. He has never used smokeless tobacco. He reports that he does not drink alcohol or use illicit drugs.  REVIEW OF SYSTEMS:  Unable to obtain full ROS due to pt intubated  SUBJECTIVE:   VITAL SIGNS: Temp:  [98 F (36.7 C)] 98 F (36.7 C) (10/05 1115) Pulse Rate:  [37-167] 71 (10/05 1450) Resp:  [13-28] 14 (10/05 1450) BP: (89-170)/(56-110) 104/62 mmHg (10/05 1445) SpO2:  [68 %-100 %] 100 % (10/05 1450) FiO2 (%):  [40 %] 40 % (10/05 1300) Weight:  [61.236 kg (135 lb)] 61.236 kg (135 lb) (10/05 1115)  HEMODYNAMICS:    VENTILATOR SETTINGS: Vent Mode:  [-] PRVC FiO2 (%):  [40 %] 40 % Set Rate:  [14 bmp] 14 bmp Vt Set:  [500 mL] 500 mL PEEP:  [5 cmH20] 5 cmH20 Plateau Pressure:  [17 cmH20] 17 cmH20  INTAKE / OUTPUT: No intake or output data in the 24 hours ending 03/29/14 1558  PHYSICAL EXAMINATION: General:  Elderly gentleman, intubated, minimal distress (occasional pulling at tube) Neuro:  RASS 1 HEENT:  Torticollis, intubated Cardiovascular:  irregulary irregular, tachy Lungs:  CTA B Abdomen:  Soft, non-tender, non-distended Musculoskeletal:  No edema, pulses present Skin:  Warm, dry, petechiae noted B LE's  LABS:  CBC  Recent Labs Lab 03/29/14 1158  WBC 4.0  HGB 11.7*  HCT 35.2*  PLT 121*   Coag's  Recent Labs Lab 03/29/14 1423  INR 1.46   BMET  Recent Labs Lab 03/29/14 1158  NA 140  K 4.4  CL 104  CO2 24  BUN 18  CREATININE 1.07  GLUCOSE 106*   Electrolytes  Recent Labs Lab 03/29/14 1158  CALCIUM 8.6   Sepsis Markers No results found for this basename: LATICACIDVEN, PROCALCITON, O2SATVEN,  in the last 168 hours  ABG  Recent Labs Lab 03/29/14 1339  PHART 7.329*  PCO2ART 47.2*  PO2ART 93.0   Liver Enzymes No results found for this basename: AST, ALT, ALKPHOS, BILITOT, ALBUMIN,  in the last 168 hours  Cardiac Enzymes No results found for this basename: TROPONINI, PROBNP,   in the last 168 hours  Glucose No results found for this basename: GLUCAP,  in the last 168 hours  Imaging No results found.   ASSESSMENT / PLAN:  PULMONARY OETT 10/5>>> A: Acute Respiratory Failure - secondary to Epiglottitis / UAO PNA - concern for consolidation on CXR, worrisome for aspiration given anatomy & hx of GERD Pleural Effusions R>L P:   Full vent support, 8cc/kg SBT daily ENT consult, will likely need coordinated effort for extubation given potential for difficult airway, noted swelling on exam  Monitor CXR ABG PRN clinical changes Empiric abx, see ID Once extubated, consider swallowing evaluation   CARDIOVASCULAR CVL n/a A:  Afib - chronic, on eliquis Hx CHF (EF 60-65% 09/2013) Hx PVD P:  Continue diltiazam, transition to liquid form Assess EKG, ICU admit Continue Eliquis  RENAL A:   Chronic renal insufficiency (baseline Cr  1.06-1.14) P:   Monitor I/O Monitor BMP Correct electrolytes as needed Hold home lasix on admission, consider IV dosing in am 10/6 D5 1/2 NS with 20 mEq kcl at 50 ml/hr   GI/GU A:   GERD Diverticulosis Hx prostate cancer BPH P:  NPO  SUP: PPI Bowel Regimen: colace, hold for diarrhea Continue home doxazosin and finasteride  HEMATOLOGIC A:  Anemia Thrombocytopenia - admission platelets 121, baseline wnl P:  Monitor CBC Cont home eliquis SCDs  INFECTIOUS A:   Left Infiltrate - concern for aspiration PNA Epiglottitis - CT neck negative for abscess  P:   BCx2  10/5 >> UC 10/5 >> U. Strep 10/5 >> Sputum 10/5 >> Flu Swab 10/5 >>  Abx: Unasyn, start date10/5, day 1/x  BC, UC, [place] pending Consult ENT as above, appreciate input  ENDOCRINE A:   Mild Hyperglycemia  P:   Monitor glucose on BMP  NEUROLOGIC A:  Hx CVA - unclear baseline functional status Torticollis Pain P:   RASS goal: -1 Fentanyl gtt  D/c propofol  PT consult once extubated    Family updated: Attempted to contact POA Paulene Floor  [phone]), left message    Interdisciplinary Family Meeting v Palliative Care Meeting:    TODAY'S SUMMARY: 80 y/o M admitted to ICU through ED for respiratory failure (intubated by anesthesia) in the setting of epiglottitis. Concern for aspiration component in setting of frail elderly.   ENT consulted for evaluation.                                      Noe Gens, NP-C [facility] Pgr: [phone] or [phone]    I have personally obtained a history, examined the patient, evaluated laboratory and imaging results, formulated the assessment and plan and placed orders.  CRITICAL CARE: The patient is critically ill with multiple organ systems failure and requires high complexity decision making for assessment and support, frequent evaluation and titration of therapies, application of advanced monitoring technologies and extensive interpretation of multiple databases. Critical Care Time devoted to patient care services described in this note is 35 minutes.   Merton Border, MD ; [facility] [phone].  After 5:30 PM or weekends, call [phone]  03/29/2014, 3:58 PM

## 2014-03-29 NOTE — ED Provider Notes (Signed)
CSN: [id number]     Arrival date & time 03/29/14  1111 History   First MD Initiated Contact with Patient 03/29/14 1112     Chief Complaint  Patient presents with  . Neck Pain     (Consider location/radiation/quality/duration/timing/severity/associated sxs/prior Treatment) HPI Comments: Patient is an 80 year old male with history of CVA, A. fib, CHF, and torticollis. He presents today with complaints of pain in the left side of his neck that started approximately 6:00 this morning in the absence of any injury or trauma. He denies any involvement of his arms or legs. He denies any headache and denies any chest pain. He feels as though he is having trouble swallowing and also believes that his voice does not sound the same (although he does not have his hearing aids in and is quite hard of hearing.)  Patient is a 80 y.o. male presenting with neck pain. The history is provided by the patient.  Neck Pain Pain location:  L side Quality:  Stabbing and stiffness Stiffness is present:  In the morning Pain radiates to:  Does not radiate Pain severity:  Moderate Onset quality:  Sudden Duration:  4 hours Timing:  Constant Progression:  Worsening Chronicity:  New Context: not fall and not recent injury   Relieved by:  Nothing Exacerbated by: Movement and palpation.   Past Medical History  Diagnosis Date  . 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 Atrial fibrillation     chronic anticaog - weintraub  . Congestive heart failure   . Anemia   . Osteoarthritis   . Splenic lesion   . Venous stasis dermatitis     hx venous ulcer/cellulitis 06/2011 R and 04/2011 L  . Prostate cancer   . Anal fissure     Hx of  . Internal hemorrhoids   . Diverticulosis 04/12/1998    Left colon--Flex Sig  . CRI (chronic renal insufficiency)   . GERD (gastroesophageal reflux disease)   . PVD (peripheral vascular disease)   . Torticollis, acquired   . Tubular adenoma    Past Surgical History  Procedure Laterality  Date  . Joint replacement  bilaterial knees  . Lumbar laminectomy  Per patient  heriated disc in mid spine  . Hernia repair      [phone], right - 1985  . Hemorrhoid surgery    . Tonsillectomy    . Colonoscopy      1988   Family History  Problem Relation Age of Onset  . Heart disease Father   . Breast cancer Mother   . Diabetes Brother   . Diabetes      grandmother  . Colon cancer Neg Hx    History  Substance Use Topics  . Smoking status: Never Smoker   . Smokeless tobacco: Never Used     Comment: married, wife in [place] since 2013  . Alcohol Use: No    Review of Systems  Musculoskeletal: Positive for neck pain.  All other systems reviewed and are negative.     Allergies  Review of patient's allergies indicates no known allergies.  Home Medications   Prior to Admission medications   Medication Sig Start Date End Date Taking? Authorizing Provider  apixaban (ELIQUIS) 2.5 MG TABS tablet Take 1 tablet (2.5 mg total) by mouth 2 (two) times daily. 12/29/13  Yes Troy Sine, MD  cholecalciferol (VITAMIN D) 1000 UNITS tablet Take 2,000 Units by mouth daily.     Yes Historical Provider, MD  diltiazem (CARDIZEM CD) 180 MG  24 hr capsule Take 1 capsule (180 mg total) by mouth daily. 04/07/13  Yes Lorretta Harp, MD  doxazosin (CARDURA) 2 MG tablet Take 2 mg by mouth at bedtime.     Yes Historical Provider, MD  finasteride (PROSCAR) 5 MG tablet Take 5 mg by mouth daily.   Yes Historical Provider, MD  furosemide (LASIX) 20 MG tablet Take 20 mg by mouth daily.   Yes Historical Provider, MD  iron polysaccharides (FERREX 150) 150 MG capsule Take 1 capsule (150 mg total) by mouth daily. 09/25/13  Yes Troy Sine, MD  magnesium oxide (MAG-OX) 400 MG tablet Take 400 mg by mouth daily.   Yes Historical Provider, MD  omeprazole (PRILOSEC) 20 MG capsule Take 1 capsule (20 mg total) by mouth daily. 11/23/13  Yes Troy Sine, MD  potassium chloride (KLOR-CON) 10 MEQ CR tablet Take 10 mEq  by mouth daily.    Yes Historical Provider, MD   BP 144/87  Pulse 97  Temp(Src) 98 F (36.7 C) (Oral)  Resp 22  Ht 5\' 6"  (1.676 m)  Wt 135 lb (61.236 kg)  BMI 21.80 kg/m2  SpO2 98% Physical Exam  Nursing note and vitals reviewed. Constitutional: He appears well-developed and well-nourished. No distress.  HENT:  Head: Normocephalic and atraumatic.  Mouth/Throat: Oropharynx is clear and moist.  Neck:  Patient is noted to have a significant torticollis of the neck. There is tenderness to palpation of the soft tissues of the left lateral neck.  Cardiovascular: Normal rate, regular rhythm and normal heart sounds.   No murmur heard. Pulmonary/Chest: Effort normal and breath sounds normal. No respiratory distress. He has no wheezes.  Abdominal: Soft. Bowel sounds are normal. He exhibits no distension. There is no tenderness.  Lymphadenopathy:    He has no cervical adenopathy.  Skin: He is not diaphoretic.    ED Course  Procedures (including critical care time) Labs Review Labs Reviewed  CBC WITH DIFFERENTIAL  BASIC METABOLIC PANEL  I-STAT [place], ED    Imaging Review No results found.   EKG Interpretation None      MDM   Final diagnoses:  None    Patient is an 81 year old male with history of torticollis. He presents today by EMS with complaints of left-sided neck pain, difficulty swallowing, and hoarse voice that started shortly after waking this morning. He denies any injury or trauma. He denies any fevers or chills.  Patient arrived here with the above complaints. Workup was initiated including laboratory studies and orders were placed for a CT scan of the neck soft tissue protocol. While waiting for the imaging studies to be performed, the patient acutely decompensated. He reported difficulty breathing and slowly became increasingly hypoxic. He was then placed on increased amounts of oxygen, however was not maintaining his saturations and ultimately required bag  valve mask. Due to his significant anatomical abnormality related to his torticollis which complicated his airway, I placed a stat page for anesthesia to assist.  Anesthesia arrived and was able to obtain an airway using the glide scope and a 7.0 endotracheal tube. There appeared to be significant swelling within the soft tissues of the airway including the epiglottis. My suspicions were for epiglottitis and the patient was given Unasyn and consultation with critical care. I've spoken with Dr. Alva Garnet who agrees to admit.  CRITICAL CARE Performed by: Veryl Speak Total critical care time: 60 minutes Critical care time was exclusive of separately billable procedures and treating other patients. Critical care was  necessary to treat or prevent imminent or life-threatening deterioration. Critical care was time spent personally by me on the following activities: development of treatment plan with patient and/or surrogate as well as nursing, discussions with consultants, evaluation of patient's response to treatment, examination of patient, obtaining history from patient or surrogate, ordering and performing treatments and interventions, ordering and review of laboratory studies, ordering and review of radiographic studies, pulse oximetry and re-evaluation of patient's condition.     Veryl Speak, MD 03/29/14 [phone]

## 2014-03-29 NOTE — ED Notes (Signed)
CT  Notified that resp and RN were ready to transport. Will call when bed is available for CT.

## 2014-03-29 NOTE — Progress Notes (Signed)
Transported pt on ventilator to 2M16  From ED. Pt stable throughout transport. Pt reaching for ETT several times.

## 2014-03-29 NOTE — ED Notes (Signed)
Per EMS: pt from home for eval of neck pain that started at 0630 this morning, pt states pain radiates to jaw. Denies any cp or sob at this time, also denies any n/v/d. EMS noted a-fib on EKG, pt with hx of the same. nad noted. Pt axo x4.

## 2014-03-29 NOTE — Anesthesia Procedure Notes (Signed)
Procedures Asked by Dr Horatio Pel  to emergently intubate patient for airway obstruction and respiratory failure. Chart reviewed, patient identified, and time-out performed. Amidate 10mg  administered intravenously.  Larygoscopy performed by Lillia Abed MD using a Macintosh #3 blade. A videoglidescope was used and the larynx was seen. The epiglottis and surrounding larygeal tissues appeared swollen. A number 7.0 subglottic tube was inserted and taped at 21cm at the lip.  Good bilateral breath sounds heard, ETCO2 positive.  Airway to RT, sedation per attending physician. Start: 12;45 End: 13:00 Lillia Abed , MD

## 2014-03-29 NOTE — Progress Notes (Addendum)
ANTIBIOTIC CONSULT NOTE - INITIAL  Pharmacy Consult for Unasyn Indication: ?aspiration PNA/epiglotitis  No Known Allergies  Patient Measurements: Height: 5\' 6"  (167.6 cm) Weight: 135 lb (61.236 kg) IBW/kg (Calculated) : 63.8   Vital Signs: Temp: 98 F (36.7 C) (10/05 1115) Temp Source: Oral (10/05 1115) BP: 110/64 mmHg (10/05 1350) Pulse Rate: 44 (10/05 1350) Intake/Output from previous day:   Intake/Output from this shift:    Labs:  Recent Labs  03/29/14 1158  WBC 4.0  HGB 11.7*  PLT 121*  CREATININE 1.07   Estimated Creatinine Clearance: 42.1 ml/min (by C-G formula based on Cr of 1.07). No results found for this basename: VANCOTROUGH, VANCOPEAK, VANCORANDOM, GENTTROUGH, GENTPEAK, GENTRANDOM, TOBRATROUGH, TOBRAPEAK, TOBRARND, AMIKACINPEAK, AMIKACINTROU, AMIKACIN,  in the last 72 hours   Microbiology: No results found for this or any previous visit (from the past 720 hour(s)).  Medical History: Past Medical History  Diagnosis Date  . 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 Atrial fibrillation     chronic anticaog - weintraub  . Congestive heart failure   . Anemia   . Osteoarthritis   . Splenic lesion   . Venous stasis dermatitis     hx venous ulcer/cellulitis 06/2011 R and 04/2011 L  . Prostate cancer   . Anal fissure     Hx of  . Internal hemorrhoids   . Diverticulosis 04/12/1998    Left colon--Flex Sig  . CRI (chronic renal insufficiency)   . GERD (gastroesophageal reflux disease)   . PVD (peripheral vascular disease)   . Torticollis, acquired   . Tubular adenoma     Assessment: 29 YOM brought in with neck pain that started early this morning. After administration of pain medications early this afternoon, patient began to have difficulty breathing and required intubation for airway protection. Started on antibiotics empirically for possible aspiration- received a dose of Unasyn 3g at 1345 this afternoon.  Goal of Therapy:  eradication of  infection  Plan:  1. Unasyn 3g IV q6h for PNA dosing 2. Follow c/s, clinical progression, renal function  Justine Dines D. Guerino Caporale, PharmD, BCPS Clinical Pharmacist Pager: [phone] 03/29/2014 2:37 PM

## 2014-03-29 NOTE — ED Notes (Addendum)
This RN went in to administer pain medications, after administration of pain medications,pt having difficulty breathing, oxygen saturations 78%-84%. Pt pale and tachy, hr 110-120's. Pt unable to speak in complete sentences. Dr. Stark Jock at bedside. Respiratory and charge RN notified and en route. Airway cart at bedside.

## 2014-03-29 NOTE — Progress Notes (Signed)
Utilization Review Completed.Donne Anon T10/10/2013

## 2014-03-29 NOTE — Significant Event (Signed)
Patient arrived to unit 2116 from ED at approximately 1515pm, arrived intubated, via stretcher with RRT and ED RN. Patient was agitated at the time, reaching out for ETT despite having mittens on and continued reorientation/education from staff. Staff had to place bilateral wrist restraints to prevent patient from pulling out ETT and other tubes/lines.   Patient settled in bed, placed on monitors. A Fib on the monitor, with heart rates in the 70s. Staff RNs placed a new foley catheter, additional PIVs, and an NG (unable to placed OG-r/t resistance). Noted nonblanchable pink skin with shearing on right buttock-placed new foam on sacrum. Patient's belongings included clothes and a pair of shoes. No family at bedside. Orders obtained from Dr. Joya Gaskins in black box. Patient being monitor closely. Eduardo Deleon, Therapist, sports.

## 2014-03-29 NOTE — Progress Notes (Signed)
[facility] Progress Note Patient Name: JSEAN TAUSSIG DOB: 1925-04-15 MRN: [id number]   Date of Service  03/29/2014  HPI/Events of Note  Pt severely agitated on fentanyl drip alone. Pt difficult airway and failed propofol d/t BP low  eICU Interventions  Pt to be restrained, medical necessity and to start versed drip     Intervention Category Major Interventions: Delirium, psychosis, severe agitation - evaluation and management  Asencion Noble 03/29/2014, 3:28 PM

## 2014-03-29 NOTE — Progress Notes (Signed)
Transported pt to CT on ventilator for CT of neck. Pt stable throughout trip. Pt back to W41 with no complications.

## 2014-03-29 NOTE — Consult Note (Deleted)
PULMONARY / CRITICAL CARE MEDICINE   Name: NETTIE CROMWELL MRN: [id number] DOB: 12-17-1924    ADMISSION DATE:  03/29/2014 CONSULTATION DATE:  03/29/14  REFERRING MD :  ED physician  CHIEF COMPLAINT:  Neck pain  INITIAL PRESENTATION: 80 yo M with PMH of CVA, Afib, CHF, IBS, GERD, and torticollis presented to ED 10/5 via EMS with neck pain that radiates to his jaw, emergently intubated by anesthesia due to respiratory failure following administration of pain medication.    STUDIES:  10/5  CT head and neck - B pleural effusions, R>L, severe torticollis, no mention of abscess, difficult exam due to anatomy  SIGNIFICANT EVENTS: 10/5  Presented to ED with neck pain radiating to jaw.  Medicated for pain, then had progressive distress.  Intubated in ER by Anesthesia      HISTORY OF PRESENT ILLNESS:  80 y/o M with a PMH of CVA, Afib (on Eliquis), CHF (EF 60-65% 09/2013), prostate ca, IBS, CKD (baseline sr cr ~1), GERD, & torticollis who presented to the ED via EMS on 105 with complaints of acute onset L sided neck pain which radiated to the jaw. He reported difficulty swallowing and and change in his voice quality (hoarse).  He was administered 4mg  morphine for pain.  After pain medication, the nurse noted a drop in his O2 sats to 78-84%.  ER staff report that he did not appear distressed from morphine administration.  Pt was noted to have tachycadia and was having difficulty speaking in full sentences due to respiratory distress.  Anesthesia was called for intubation due to pt having difficult airway because of severe torticollis.  Anesthesia notes relfect a swollen epiglottis, but was intubated without complications.  Labs were essentially normal in the ER.  CXR concerning for L infiltrate.  PCCM called for ICU admission.    At baseline, he lives at home alone.  His wife is in a SNF.  He does have a POA but no family available at time of admission.   PAST MEDICAL HISTORY :   has a past medical  history of Stroke (1978 lower brain stem); Atrial fibrillation; Congestive heart failure; Anemia; Osteoarthritis; Splenic lesion; Venous stasis dermatitis; Prostate cancer; Anal fissure; Internal hemorrhoids; Diverticulosis (04/12/1998); CRI (chronic renal insufficiency); GERD (gastroesophageal reflux disease); PVD (peripheral vascular disease); Torticollis, acquired; and Tubular adenoma.  has past surgical history that includes Joint replacement (bilaterial knees); Lumbar laminectomy (Per patient  heriated disc in mid spine); Hernia repair; Hemorrhoid surgery; Tonsillectomy; and Colonoscopy.  HOME MEDICATIONS: Prior to Admission medications   Medication Sig Start Date End Date Taking? Authorizing Provider  apixaban (ELIQUIS) 2.5 MG TABS tablet Take 1 tablet (2.5 mg total) by mouth 2 (two) times daily. 12/29/13  Yes Troy Sine, MD  cholecalciferol (VITAMIN D) 1000 UNITS tablet Take 2,000 Units by mouth daily.     Yes Historical Provider, MD  diltiazem (CARDIZEM CD) 180 MG 24 hr capsule Take 1 capsule (180 mg total) by mouth daily. 04/07/13  Yes Lorretta Harp, MD  doxazosin (CARDURA) 2 MG tablet Take 2 mg by mouth at bedtime.     Yes Historical Provider, MD  finasteride (PROSCAR) 5 MG tablet Take 5 mg by mouth daily.   Yes Historical Provider, MD  furosemide (LASIX) 20 MG tablet Take 20 mg by mouth daily.   Yes Historical Provider, MD  iron polysaccharides (FERREX 150) 150 MG capsule Take 1 capsule (150 mg total) by mouth daily. 09/25/13  Yes Troy Sine, MD  magnesium  oxide (MAG-OX) 400 MG tablet Take 400 mg by mouth daily.   Yes Historical Provider, MD  omeprazole (PRILOSEC) 20 MG capsule Take 1 capsule (20 mg total) by mouth daily. 11/23/13  Yes Troy Sine, MD  potassium chloride (KLOR-CON) 10 MEQ CR tablet Take 10 mEq by mouth daily.    Yes Historical Provider, MD   No Known Allergies  FAMILY HISTORY:  indicated that his mother is deceased. He indicated that his father is alive.    SOCIAL HISTORY:  reports that he has never smoked. He has never used smokeless tobacco. He reports that he does not drink alcohol or use illicit drugs.  REVIEW OF SYSTEMS:  Unable to obtain full ROS due to pt intubated  SUBJECTIVE:   VITAL SIGNS: Temp:  [98 F (36.7 C)] 98 F (36.7 C) (10/05 1115) Pulse Rate:  [37-167] 71 (10/05 1450) Resp:  [13-28] 14 (10/05 1450) BP: (89-170)/(56-110) 104/62 mmHg (10/05 1445) SpO2:  [68 %-100 %] 100 % (10/05 1450) FiO2 (%):  [40 %] 40 % (10/05 1300) Weight:  [135 lb (61.236 kg)] 135 lb (61.236 kg) (10/05 1115)  HEMODYNAMICS:    VENTILATOR SETTINGS: Vent Mode:  [-] PRVC FiO2 (%):  [40 %] 40 % Set Rate:  [14 bmp] 14 bmp Vt Set:  [500 mL] 500 mL PEEP:  [5 cmH20] 5 cmH20 Plateau Pressure:  [17 cmH20] 17 cmH20  INTAKE / OUTPUT: No intake or output data in the 24 hours ending 03/29/14 1530  PHYSICAL EXAMINATION: General:  Elderly gentleman, intubated, minimal distress (occasional pulling at tube) Neuro:  RASS 1 HEENT:  Torticollis, intubated Cardiovascular:  irregulary irregular, tachy Lungs:  CTA B Abdomen:  Soft, non-tender, non-distended Musculoskeletal:  No edema, pulses present Skin:  Warm, dry, petechiae noted B LE's  LABS:  CBC  Recent Labs Lab 03/29/14 1158  WBC 4.0  HGB 11.7*  HCT 35.2*  PLT 121*   Coag's  Recent Labs Lab 03/29/14 1423  INR 1.46   BMET  Recent Labs Lab 03/29/14 1158  NA 140  K 4.4  CL 104  CO2 24  BUN 18  CREATININE 1.07  GLUCOSE 106*   Electrolytes  Recent Labs Lab 03/29/14 1158  CALCIUM 8.6   Sepsis Markers No results found for this basename: LATICACIDVEN, PROCALCITON, O2SATVEN,  in the last 168 hours  ABG  Recent Labs Lab 03/29/14 1339  PHART 7.329*  PCO2ART 47.2*  PO2ART 93.0   Liver Enzymes No results found for this basename: AST, ALT, ALKPHOS, BILITOT, ALBUMIN,  in the last 168 hours  Cardiac Enzymes No results found for this basename: TROPONINI, PROBNP,   in the last 168 hours  Glucose No results found for this basename: GLUCAP,  in the last 168 hours  Imaging No results found.   ASSESSMENT / PLAN:  PULMONARY OETT 10/5>>> A: Acute Respiratory Failure - secondary to Epiglottitis / UAO PNA - concern for consolidation on CXR, worrisome for aspiration given anatomy & hx of GERD Pleural Effusions R>L P:   Full vent support, 8cc/kg SBT daily ENT consult, will likely need coordinated effort for extubation given potential for difficult airway, noted swelling on exam  Monitor CXR ABG PRN clinical changes Empiric abx, see ID Once extubated, consider swallowing evaluation   CARDIOVASCULAR CVL n/a A:  Afib - chronic, on eliquis Hx CHF (EF 60-65% 09/2013) Hx PVD P:  Continue diltiazam, transition to liquid form Assess EKG, ICU admit Continue Eliquis  RENAL A:   Chronic renal insufficiency (baseline Cr  1.06-1.14) P:   Monitor I/O Monitor BMP Correct electrolytes as needed Hold home lasix on admission, consider IV dosing in am 10/6 D5 1/2 NS with 20 mEq kcl at 50 ml/hr   GI/GU A:   GERD Diverticulosis Hx prostate cancer BPH P:  NPO  SUP: PPI Bowel Regimen: colace, hold for diarrhea Continue home doxazosin and finasteride  HEMATOLOGIC A:  Anemia Thrombocytopenia - admission platelets 121, baseline wnl P:  Monitor CBC Cont home eliquis SCDs  INFECTIOUS A:   Left Infiltrate - concern for aspiration PNA Epiglottitis - CT neck negative for abscess  P:   BCx2  10/5 >> UC 10/5 >> U. Strep 10/5 >> Sputum 10/5 >> Flu Swab 10/5 >>  Abx: Unasyn, start date10/5, day 1/x  BC, UC, [place] pending Consult ENT as above, appreciate input  ENDOCRINE A:   Mild Hyperglycemia  P:   Monitor glucose on BMP  NEUROLOGIC A:  Hx CVA - unclear baseline functional status Torticollis Pain P:   RASS goal: -1 Fentanyl gtt  D/c propofol  PT consult once extubated    Family updated: Attempted to contact POA Paulene Floor  [phone]), left message    Interdisciplinary Family Meeting v Palliative Care Meeting:    TODAY'S SUMMARY: 80 y/o M admitted to ICU through ED for respiratory failure (intubated by anesthesia) in the setting of epiglottitis. Concern for aspiration component in setting of frail elderly.   ENT consulted for evaluation.                                      Noe Gens, NP-C [facility] Pgr: [phone] or [phone]    I have personally obtained a history, examined the patient, evaluated laboratory and imaging results, formulated the assessment and plan and placed orders.  CRITICAL CARE: The patient is critically ill with multiple organ systems failure and requires high complexity decision making for assessment and support, frequent evaluation and titration of therapies, application of advanced monitoring technologies and extensive interpretation of multiple databases. Critical Care Time devoted to patient care services described in this note is ____ minutes.    03/29/2014, 3:30 PM

## 2014-03-29 NOTE — ED Notes (Signed)
Notified pharmacy for fentanyl drip.

## 2014-03-29 NOTE — ED Notes (Addendum)
Dr. Conrad Maceo, anesthesia at bedside.

## 2014-03-29 NOTE — Procedures (Signed)
Intubation Procedure Note Eduardo Deleon [id number] [birth year]  Procedure: Intubation Indications: Airway protection and maintenance  Procedure Details Consent: Risks of procedure as well as the alternatives and risks of each were explained to the (patient/caregiver).  Consent for procedure obtained. Time Out: Verified patient identification, verified procedure, site/side was marked, verified correct patient position, special equipment/implants available, medications/allergies/relevent history reviewed, required imaging and test results available.  Performed  Maximum sterile technique was used including gloves, gown, hand hygiene and mask.  3    Evaluation Hemodynamic Status: BP stable throughout; O2 sats: stable throughout Patient's Current Condition: stable Complications: No apparent complications Patient did tolerate procedure well. Chest X-ray ordered to verify placement.  CXR: pending  Pt having difficulty breathing with stridor. Pt intubated by Anesthesia using glidescope #3 with 7.0 ett on 1st attempt with no complications. Ett secured at 22 at the lip and verified by direct visualization of vocal cords using glidescope, bilateral breath sounds, positive color change on etco2. CXR pending. Pt epiglottis noted to be swollen during intubation. Pt vitals stable throughout procedure.     Jesse Sans 03/29/2014

## 2014-03-29 NOTE — ED Notes (Signed)
Attempted to notify family or next of kin for patient, unable to contact. Anesthesia notified by Dr. Stark Jock for intubation.

## 2014-03-30 ENCOUNTER — Inpatient Hospital Stay (HOSPITAL_COMMUNITY): Payer: Medicare Other

## 2014-03-30 ENCOUNTER — Ambulatory Visit: Payer: Self-pay | Admitting: Pharmacist Clinician (PhC)/ Clinical Pharmacy Specialist

## 2014-03-30 DIAGNOSIS — I4891 Unspecified atrial fibrillation: Secondary | ICD-10-CM

## 2014-03-30 LAB — BASIC METABOLIC PANEL
Anion gap: 12 (ref 5–15)
BUN: 15 mg/dL (ref 6–23)
CO2: 22 meq/L (ref 19–32)
CREATININE: 0.96 mg/dL (ref 0.50–1.35)
Calcium: 8.1 mg/dL — ABNORMAL LOW (ref 8.4–10.5)
Chloride: 103 mEq/L (ref 96–112)
GFR calc non Af Amer: 72 mL/min — ABNORMAL LOW (ref >90)
GFR, EST AFRICAN AMERICAN: 84 mL/min — AB (ref >90)
Glucose, Bld: 97 mg/dL (ref 70–99)
Potassium: 4.2 mEq/L (ref 3.7–5.3)
Sodium: 137 mEq/L (ref 137–147)

## 2014-03-30 LAB — CBC
HCT: 33.6 % — ABNORMAL LOW (ref 39.0–52.0)
Hemoglobin: 11.2 g/dL — ABNORMAL LOW (ref 13.0–17.0)
MCH: 29.6 pg (ref 26.0–34.0)
MCHC: 33.3 g/dL (ref 30.0–36.0)
MCV: 88.7 fL (ref 78.0–100.0)
PLATELETS: 110 10*3/uL — AB (ref 150–400)
RBC: 3.79 MIL/uL — ABNORMAL LOW (ref 4.22–5.81)
RDW: 14.8 % (ref 11.5–15.5)
WBC: 4.4 10*3/uL (ref 4.0–10.5)

## 2014-03-30 LAB — GLUCOSE, CAPILLARY
GLUCOSE-CAPILLARY: 168 mg/dL — AB (ref 70–99)
Glucose-Capillary: 122 mg/dL — ABNORMAL HIGH (ref 70–99)

## 2014-03-30 MED ORDER — PANTOPRAZOLE SODIUM 40 MG PO PACK
40.0000 mg | PACK | Freq: Every day | ORAL | Status: DC
  Administered 2014-03-30 – 2014-04-02 (×4): 40 mg
  Filled 2014-03-30 (×7): qty 20

## 2014-03-30 MED ORDER — DEXTROSE 5 % IV SOLN
5.0000 mg/h | INTRAVENOUS | Status: DC
  Filled 2014-03-30: qty 100

## 2014-03-30 MED ORDER — PNEUMOCOCCAL VAC POLYVALENT 25 MCG/0.5ML IJ INJ
0.5000 mL | INJECTION | INTRAMUSCULAR | Status: DC
  Filled 2014-03-30: qty 0.5

## 2014-03-30 MED ORDER — FAMOTIDINE 40 MG/5ML PO SUSR
20.0000 mg | Freq: Every day | ORAL | Status: DC
  Filled 2014-03-30: qty 2.5

## 2014-03-30 MED ORDER — INFLUENZA VAC SPLIT QUAD 0.5 ML IM SUSY
0.5000 mL | PREFILLED_SYRINGE | INTRAMUSCULAR | Status: DC
  Filled 2014-03-30: qty 0.5

## 2014-03-30 MED ORDER — METHYLPREDNISOLONE SODIUM SUCC 125 MG IJ SOLR
60.0000 mg | Freq: Four times a day (QID) | INTRAMUSCULAR | Status: AC
  Administered 2014-03-30 – 2014-03-31 (×4): 60 mg via INTRAVENOUS
  Filled 2014-03-30 (×4): qty 0.96

## 2014-03-30 MED ORDER — DILTIAZEM LOAD VIA INFUSION
10.0000 mg | Freq: Once | INTRAVENOUS | Status: DC
  Filled 2014-03-30: qty 10

## 2014-03-30 MED ORDER — VITAL AF 1.2 CAL PO LIQD
1000.0000 mL | ORAL | Status: DC
  Filled 2014-03-30 (×2): qty 1000

## 2014-03-30 MED ORDER — VITAL HIGH PROTEIN PO LIQD
1000.0000 mL | ORAL | Status: DC
  Filled 2014-03-30 (×2): qty 1000

## 2014-03-30 MED ORDER — VITAL AF 1.2 CAL PO LIQD
1000.0000 mL | ORAL | Status: AC
  Administered 2014-03-30: 25 mL
  Filled 2014-03-30: qty 1000

## 2014-03-30 NOTE — Progress Notes (Signed)
PS increased to 15 d/t low min volume alarms.

## 2014-03-30 NOTE — Progress Notes (Signed)
INITIAL NUTRITION ASSESSMENT  DOCUMENTATION CODES Per approved criteria  -Not Applicable   INTERVENTION:  Initiate TF via OGT with Vital AF 1.2 at 25 ml/h on day 1; on day 2, increase to goal rate of 55 ml/h (1320 ml per day) to provide 1584 kcals, 99 gm protein, 1071 ml free water daily.  NUTRITION DIAGNOSIS: Inadequate oral intake related to inability to eat as evidenced by NPO status.   Goal: Intake to meet >90% of estimated nutrition needs.  Monitor:  TF tolerance/adequacy, weight trend, labs, vent status.  Reason for Assessment: MD Consult for TF initiation and management.  80 y.o. male  Admitting Dx: Neck Pain  ASSESSMENT: 80 yo M with PMH of CVA, Afib, CHF, IBS, GERD, and torticollis presented to ED 10/5 via EMS with neck pain that radiates to his jaw, emergently intubated by anesthesia due to respiratory failure following administration of pain medication.   Unable to complete nutrition focused physical exam at this time.  Patient is currently intubated on ventilator support MV: 7.6 L/min Temp (24hrs), Avg:98.9 F (37.2 C), Min:98.1 F (36.7 C), Max:99.5 F (37.5 C)   Height: Ht Readings from Last 1 Encounters:  03/29/14 5\' 6"  (1.676 m)    Weight: Wt Readings from Last 1 Encounters:  03/30/14 147 lb 14.9 oz (67.1 kg)    Ideal Body Weight: 64.5 kg  % Ideal Body Weight: 104%  Wt Readings from Last 10 Encounters:  03/30/14 147 lb 14.9 oz (67.1 kg)  02/23/14 136 lb 6 oz (61.859 kg)  10/09/13 136 lb 11 oz (62 kg)  09/09/13 140 lb 9.6 oz (63.776 kg)  05/27/13 140 lb 12.8 oz (63.866 kg)  02/22/12 155 lb (70.308 kg)  09/12/11 160 lb (72.576 kg)  08/29/11 157 lb 12.8 oz (71.578 kg)  08/24/11 160 lb (72.576 kg)  06/28/11 172 lb 6.4 oz (78.2 kg)    Usual Body Weight: 136-140 lb  % Usual Body Weight: 107%  BMI:  Body mass index is 23.89 kg/(m^2).  Estimated Nutritional Needs: Kcal: 1529 Protein: 90-110 gm Fluid: 1.6 L  Skin: stage 1 pressure  ulcer to right buttocks  Diet Order: NPO  EDUCATION NEEDS: -Education not appropriate at this time   Intake/Output Summary (Last 24 hours) at 03/30/14 1533 Last data filed at 03/30/14 1500  Gross per 24 hour  Intake 2215.98 ml  Output   1455 ml  Net 760.98 ml    Last BM: None documented since admission    Labs:   Recent Labs Lab 03/29/14 1158 03/30/14 0240  NA 140 137  K 4.4 4.2  CL 104 103  CO2 24 22  BUN 18 15  CREATININE 1.07 0.96  CALCIUM 8.6 8.1*  GLUCOSE 106* 97    CBG (last 3)   Recent Labs  03/29/14 1610 03/29/14 2030  GLUCAP 136* 99    Scheduled Meds: . ampicillin-sulbactam (UNASYN) IV  3 g Intravenous Once  . ampicillin-sulbactam (UNASYN) IV  3 g Intravenous Q6H  . antiseptic oral rinse  7 mL Mouth Rinse QID  . apixaban  2.5 mg Oral BID  . chlorhexidine  15 mL Mouth Rinse BID  . cholecalciferol  2,000 Units Oral Daily  . diltiazem  45 mg Per Tube 4 times per day  . docusate sodium  100 mg Oral Daily  . doxazosin  2 mg Oral QHS  . feeding supplement (VITAL HIGH PROTEIN)  1,000 mL Per Tube Q24H  . methylPREDNISolone (SOLU-MEDROL) injection  60 mg Intravenous Q6H  .  pantoprazole sodium  40 mg Per Tube Daily    Continuous Infusions: . dextrose 5 % and 0.45 % NaCl with KCl 20 mEq/L 50 mL/hr at 03/30/14 1400  . fentaNYL infusion INTRAVENOUS 50 mcg/hr (03/30/14 1510)  . midazolam (VERSED) infusion Stopped (03/30/14 1437)    Past Medical History  Diagnosis Date  . 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 Atrial fibrillation     chronic anticaog - weintraub  . Congestive heart failure   . Anemia   . Osteoarthritis   . Splenic lesion   . Venous stasis dermatitis     hx venous ulcer/cellulitis 06/2011 R and 04/2011 L  . Prostate cancer   . Anal fissure     Hx of  . Internal hemorrhoids   . Diverticulosis 04/12/1998    Left colon--Flex Sig  . CRI (chronic renal insufficiency)   . GERD (gastroesophageal reflux disease)   . PVD (peripheral  vascular disease)   . Torticollis, acquired   . Tubular adenoma     Past Surgical History  Procedure Laterality Date  . Joint replacement  bilaterial knees  . Lumbar laminectomy  Per patient  heriated disc in mid spine  . Hernia repair      [phone], right - 1985  . Hemorrhoid surgery    . Tonsillectomy    . Colonoscopy      1988    Molli Barrows, RD, LDN, [place] Pager [phone] After Hours Pager [phone]

## 2014-03-30 NOTE — Consult Note (Signed)
Eduardo Deleon, Eduardo Deleon 80 y.o., male [id number]     Chief Complaint: laryngeal edema  HPI: 80 yo wm, onset neck and throat pain and breathing diff x 3 days.  Presented to ER.  Intubated with airway compromise.   Severe edema noted.  No cultures taken.  No ACE inhibitors.  CCM requested exam under anesthesia, possible extubation.  On schedule tomorrow 1200.    PMH: Past Medical History  Diagnosis Date  . 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 Atrial fibrillation     chronic anticaog - weintraub  . Congestive heart failure   . Anemia   . Osteoarthritis   . Splenic lesion   . Venous stasis dermatitis     hx venous ulcer/cellulitis 06/2011 R and 04/2011 L  . Prostate cancer   . Anal fissure     Hx of  . Internal hemorrhoids   . Diverticulosis 04/12/1998    Left colon--Flex Sig  . CRI (chronic renal insufficiency)   . GERD (gastroesophageal reflux disease)   . PVD (peripheral vascular disease)   . Torticollis, acquired   . Tubular adenoma     Surg Hx: Past Surgical History  Procedure Laterality Date  . Joint replacement  bilaterial knees  . Lumbar laminectomy  Per patient  heriated disc in mid spine  . Hernia repair      [phone], right - 1985  . Hemorrhoid surgery    . Tonsillectomy    . Colonoscopy      1988    FHx:   Family History  Problem Relation Age of Onset  . Heart disease Father   . Breast cancer Mother   . Diabetes Brother   . Diabetes      grandmother  . Colon cancer Neg Hx    SocHx:  reports that he has never smoked. He has never used smokeless tobacco. He reports that he does not drink alcohol or use illicit drugs.  ALLERGIES: No Known Allergies  Medications Prior to Admission  Medication Sig Dispense Refill  . apixaban (ELIQUIS) 2.5 MG TABS tablet Take 1 tablet (2.5 mg total) by mouth 2 (two) times daily.  180 tablet  2  . cholecalciferol (VITAMIN D) 1000 UNITS tablet Take 2,000 Units by mouth daily.        Marland Kitchen diltiazem (CARDIZEM CD) 180 MG 24 hr capsule  Take 1 capsule (180 mg total) by mouth daily.  30 capsule  11  . doxazosin (CARDURA) 2 MG tablet Take 2 mg by mouth at bedtime.        . finasteride (PROSCAR) 5 MG tablet Take 5 mg by mouth daily.      . furosemide (LASIX) 20 MG tablet Take 20 mg by mouth daily.      . iron polysaccharides (FERREX 150) 150 MG capsule Take 1 capsule (150 mg total) by mouth daily.  30 capsule  5  . magnesium oxide (MAG-OX) 400 MG tablet Take 400 mg by mouth daily.      Marland Kitchen omeprazole (PRILOSEC) 20 MG capsule Take 1 capsule (20 mg total) by mouth daily.  90 capsule  3  . potassium chloride (KLOR-CON) 10 MEQ CR tablet Take 10 mEq by mouth daily.         Results for orders placed during the hospital encounter of 03/29/14 (from the past 48 hour(s))  CBC WITH DIFFERENTIAL     Status: Abnormal   Collection Time    03/29/14 11:58 AM      Result Value Ref Range  WBC 4.0  4.0 - 10.5 K/uL   RBC 3.99 (*) 4.22 - 5.81 MIL/uL   Hemoglobin 11.7 (*) 13.0 - 17.0 g/dL   HCT 35.2 (*) 39.0 - 52.0 %   MCV 88.2  78.0 - 100.0 fL   MCH 29.3  26.0 - 34.0 pg   MCHC 33.2  30.0 - 36.0 g/dL   RDW 14.7  11.5 - 15.5 %   Platelets 121 (*) 150 - 400 K/uL   Neutrophils Relative % 78 (*) 43 - 77 %   Neutro Abs 3.2  1.7 - 7.7 K/uL   Lymphocytes Relative 15  12 - 46 %   Lymphs Abs 0.6 (*) 0.7 - 4.0 K/uL   Monocytes Relative 5  3 - 12 %   Monocytes Absolute 0.2  0.1 - 1.0 K/uL   Eosinophils Relative 1  0 - 5 %   Eosinophils Absolute 0.0  0.0 - 0.7 K/uL   Basophils Relative 1  0 - 1 %   Basophils Absolute 0.0  0.0 - 0.1 K/uL  BASIC METABOLIC PANEL     Status: Abnormal   Collection Time    03/29/14 11:58 AM      Result Value Ref Range   Sodium 140  137 - 147 mEq/L   Potassium 4.4  3.7 - 5.3 mEq/L   Chloride 104  96 - 112 mEq/L   CO2 24  19 - 32 mEq/L   Glucose, Bld 106 (*) 70 - 99 mg/dL   BUN 18  6 - 23 mg/dL   Creatinine, Ser 1.07  0.50 - 1.35 mg/dL   Calcium 8.6  8.4 - 10.5 mg/dL   GFR calc non Af Amer 60 (*) >90 mL/min   GFR  calc Af Amer 70 (*) >90 mL/min   Comment: (NOTE)     The eGFR has been calculated using the CKD EPI equation.     This calculation has not been validated in all clinical situations.     eGFR's persistently <90 mL/min signify possible Chronic Kidney     Disease.   Anion gap 12  5 - 15  I-STAT TROPOININ, ED     Status: None   Collection Time    03/29/14 12:08 PM      Result Value Ref Range   Troponin i, poc 0.00  0.00 - 0.08 ng/mL   Comment 3            Comment: Due to the release kinetics of cTnI,     a negative result within the first hours     of the onset of symptoms does not rule out     myocardial infarction with certainty.     If myocardial infarction is still suspected,     repeat the test at appropriate intervals.  CULTURE, BLOOD (ROUTINE X 2)     Status: None   Collection Time    03/29/14  1:35 PM      Result Value Ref Range   Specimen Description BLOOD LEFT HAND     Special Requests BOTTLES DRAWN AEROBIC AND ANAEROBIC 5CC     Culture  Setup Time       Value: 03/29/2014 17:07     Performed at [facility]   Culture       Value:        BLOOD CULTURE RECEIVED NO GROWTH TO DATE CULTURE WILL BE HELD FOR 5 DAYS BEFORE ISSUING A FINAL NEGATIVE REPORT     Performed at [facility]   Report Status PENDING    CULTURE, BLOOD (ROUTINE X 2)     Status: None   Collection Time    03/29/14  1:38 PM      Result Value Ref Range   Specimen Description BLOOD RIGHT FOREARM     Special Requests BOTTLES DRAWN AEROBIC AND ANAEROBIC 5CC     Culture  Setup Time       Value: 03/29/2014 17:07     Performed at [facility]   Culture       Value:        BLOOD CULTURE RECEIVED NO GROWTH TO DATE CULTURE WILL BE HELD FOR 5 DAYS BEFORE ISSUING A FINAL NEGATIVE REPORT     Performed at [facility]   Report Status PENDING    I-STAT ARTERIAL BLOOD GAS, ED     Status: Abnormal   Collection Time    03/29/14  1:39 PM      Result Value Ref Range   pH, Arterial 7.329  (*) 7.350 - 7.450   pCO2 arterial 47.2 (*) 35.0 - 45.0 mmHg   pO2, Arterial 93.0  80.0 - 100.0 mmHg   Bicarbonate 24.8 (*) 20.0 - 24.0 mEq/L   TCO2 26  0 - 100 mmol/L   O2 Saturation 97.0     Acid-base deficit 1.0  0.0 - 2.0 mmol/L   Patient temperature 37.0 C     Collection site RADIAL, ALLEN'S TEST ACCEPTABLE     Drawn by VENIPUNCTURE     Sample type ARTERIAL    PROTIME-INR     Status: Abnormal   Collection Time    03/29/14  2:23 PM      Result Value Ref Range   Prothrombin Time 17.7 (*) 11.6 - 15.2 seconds   INR 1.46  0.00 - 1.49  CULTURE, RESPIRATORY (NON-EXPECTORATED)     Status: None   Collection Time    03/29/14  3:50 PM      Result Value Ref Range   Specimen Description TRACHEAL ASPIRATE     Special Requests NONE     Gram Stain       Value: FEW WBC PRESENT,BOTH PMN AND MONONUCLEAR     RARE SQUAMOUS EPITHELIAL CELLS PRESENT     MODERATE GRAM POSITIVE COCCI     IN PAIRS IN CLUSTERS MODERATE GRAM NEGATIVE RODS     RARE GRAM POSITIVE RODS   Culture PENDING     Report Status PENDING    MRSA PCR SCREENING     Status: None   Collection Time    03/29/14  4:08 PM      Result Value Ref Range   MRSA by PCR NEGATIVE  NEGATIVE   Comment:            The GeneXpert MRSA Assay (FDA     approved for NASAL specimens     only), is one component of a     comprehensive MRSA colonization     surveillance program. It is not     intended to diagnose MRSA     infection nor to guide or     monitor treatment for     MRSA infections.  STREP PNEUMONIAE URINARY ANTIGEN     Status: None   Collection Time    03/29/14  4:09 PM      Result Value Ref Range   Strep Pneumo Urinary Antigen NEGATIVE  NEGATIVE   Comment:  Infection due to S. pneumoniae     cannot be absolutely ruled out     since the antigen present     may be below the detection limit     of the test.  URINALYSIS, ROUTINE W REFLEX MICROSCOPIC     Status: None   Collection Time    03/29/14  4:09 PM      Result  Value Ref Range   Color, Urine YELLOW  YELLOW   APPearance CLEAR  CLEAR   Specific Gravity, Urine 1.015  1.005 - 1.030   pH 6.5  5.0 - 8.0   Glucose, UA NEGATIVE  NEGATIVE mg/dL   Hgb urine dipstick NEGATIVE  NEGATIVE   Bilirubin Urine NEGATIVE  NEGATIVE   Ketones, ur NEGATIVE  NEGATIVE mg/dL   Protein, ur NEGATIVE  NEGATIVE mg/dL   Urobilinogen, UA 1.0  0.0 - 1.0 mg/dL   Nitrite NEGATIVE  NEGATIVE   Leukocytes, UA NEGATIVE  NEGATIVE   Comment: MICROSCOPIC NOT DONE ON URINES WITH NEGATIVE PROTEIN, BLOOD, LEUKOCYTES, NITRITE, OR GLUCOSE <1000 mg/dL.  GLUCOSE, CAPILLARY     Status: Abnormal   Collection Time    03/29/14  4:10 PM      Result Value Ref Range   Glucose-Capillary 136 (*) 70 - 99 mg/dL   Comment 1 Notify RN    GLUCOSE, CAPILLARY     Status: None   Collection Time    03/29/14  8:30 PM      Result Value Ref Range   Glucose-Capillary 99  70 - 99 mg/dL  CBC     Status: Abnormal   Collection Time    03/30/14  2:40 AM      Result Value Ref Range   WBC 4.4  4.0 - 10.5 K/uL   RBC 3.79 (*) 4.22 - 5.81 MIL/uL   Hemoglobin 11.2 (*) 13.0 - 17.0 g/dL   HCT 33.6 (*) 39.0 - 52.0 %   MCV 88.7  78.0 - 100.0 fL   MCH 29.6  26.0 - 34.0 pg   MCHC 33.3  30.0 - 36.0 g/dL   RDW 14.8  11.5 - 15.5 %   Platelets 110 (*) 150 - 400 K/uL   Comment: SPECIMEN CHECKED FOR CLOTS     REPEATED TO VERIFY     PLATELET COUNT CONFIRMED BY SMEAR  BASIC METABOLIC PANEL     Status: Abnormal   Collection Time    03/30/14  2:40 AM      Result Value Ref Range   Sodium 137  137 - 147 mEq/L   Potassium 4.2  3.7 - 5.3 mEq/L   Chloride 103  96 - 112 mEq/L   CO2 22  19 - 32 mEq/L   Glucose, Bld 97  70 - 99 mg/dL   BUN 15  6 - 23 mg/dL   Creatinine, Ser 0.96  0.50 - 1.35 mg/dL   Calcium 8.1 (*) 8.4 - 10.5 mg/dL   GFR calc non Af Amer 72 (*) >90 mL/min   GFR calc Af Amer 84 (*) >90 mL/min   Comment: (NOTE)     The eGFR has been calculated using the CKD EPI equation.     This calculation has not been  validated in all clinical situations.     eGFR's persistently <90 mL/min signify possible Chronic Kidney     Disease.   Anion gap 12  5 - 15   Ct Soft Tissue Neck W Contrast  03/29/2014   CLINICAL DATA:  Neck pain  radiating to the mandible beginning this morning.  EXAM: CT NECK WITH CONTRAST  TECHNIQUE: Multidetector CT imaging of the neck was performed using the standard protocol following the bolus administration of intravenous contrast.  CONTRAST:  75 mL OMNIPAQUE IOHEXOL 300 MG/ML  SOLN  COMPARISON:  Head CT scan 10/08/2013. Cervical spine CT scan 09/26/2009.  FINDINGS: The study is severely limited due to the patient's torticollis. Intracranial contents appear normal. Major caliber vascular structures opacify. No mass is seen. Endotracheal tube is in place. No lymphadenopathy is identified. Lung apices demonstrate pleural effusions appearing greater on the right. Atherosclerotic vascular disease is identified. No lytic or sclerotic bony lesion is seen. The patient has severe multilevel cervical spondylosis. 0.6 cm anterolisthesis C2 on C3 is identified, unchanged.  IMPRESSION: Markedly limited study demonstrates no acute abnormality of the neck.  Bilateral pleural effusions, greater on the right.  Atherosclerosis.  Severe torticollis and multilevel cervical spondylosis.   Electronically Signed   By: Inge Rise M.D.   On: 03/29/2014 14:47   Dg Chest Port 1 View  03/30/2014   CLINICAL DATA:  Intubation.  EXAM: PORTABLE CHEST - 1 VIEW  COMPARISON:  03/29/2014.  FINDINGS: Endotracheal tube tip again 1 cm from the chronic, proximal repositioning should be considered. NG tube in stable position. Cardiomegaly. Normal pulmonary vascularity. Previously identified pulmonary edema has almost completely cleared. Left lower lobe atelectasis and/or infiltrate remains. No pleural effusion or pneumothorax.  IMPRESSION: 1. Endotracheal tube in stable position 1 cm above the carina, proximal repositioning should  be considered. NG tube in stable position with tip below the hemidiaphragm. 2. Interim near complete clearing of pulmonary edema. Stable cardiomegaly. Pulmonary vascularity is normal. 3. Persistent left lower lobe atelectasis and/or infiltrate.   Electronically Signed   By: Marcello Moores  Register   On: 03/30/2014 07:23   Dg Chest Portable 1 View  03/29/2014   CLINICAL DATA:  Hypoxia; congestive heart failure  EXAM: PORTABLE CHEST - 1 VIEW  COMPARISON:  May 19, 2011  FINDINGS: Endotracheal tube tip is 1.0 cm above the carina. No pneumothorax apparent. There is cardiomegaly with mild bibasilar edema. There is consolidation in the medial left base. Elsewhere lungs are clear. No adenopathy. Pulmonary vascularity is within normal limits. No bone lesions.  IMPRESSION: Endotracheal tube as described without pneumothorax. Evidence of a degree of congestive heart failure. Consolidation medial left base.   Electronically Signed   By: Lowella Grip M.D.   On: 03/29/2014 13:36   Dg Abd Portable 1v  03/30/2014   CLINICAL DATA:  Nasogastric tube placement; gastroesophageal reflux  EXAM: PORTABLE ABDOMEN - 1 VIEW  COMPARISON:  None.  FINDINGS: Nasogastric tube tip and side port are in the stomach. There is moderate stool throughout colon. Bowel gas pattern unremarkable. There is scoliosis and arthropathy in the lumbar spine.  IMPRESSION: Nasogastric tube tip and side-port in the stomach.   Electronically Signed   By: Lowella Grip M.D.   On: 03/30/2014 11:46      Blood pressure 150/107, pulse 91, temperature 99.4 F (37.4 C), temperature source Oral, resp. rate 14, height 5' 6"  (1.676 m), weight 67.1 kg (147 lb 14.9 oz), SpO2 98.00%.  PHYSICAL EXAM: Overall appearance  Frail, agitated.  Head severely rotated clockwise on cervical spine, chronic.   Head:  NCAT Ears: not examined Nose: not examined Oral Cavity:  OTT, OG tube present. Oral Pharynx/Hypopharynx/Larynx:  Not examined Neuro:  Not  examined Neck:  Thin, distorted anatomy secondary to torticollis.  Studies Reviewed:  CT neck difficult to interpret due to abnormal rotation.   Assessment/Plan Laryngeal edema with airway compromise.     On schedule for tomorrow 1200 for EUA , direct laryngoscopy.  Tracheostomy only if complications.    [name] 51/02/8241, 6:48 PM

## 2014-03-30 NOTE — Progress Notes (Signed)
PULMONARY / CRITICAL CARE MEDICINE   Name: Eduardo Deleon MRN: [id number] DOB: Mar 11, 1925    ADMISSION DATE:  03/29/2014 CONSULTATION DATE:  03/29/14  REFERRING MD :  ED physician  CHIEF COMPLAINT:  Neck pain  INITIAL PRESENTATION: 80 yo M with PMH of CVA, Afib, CHF, IBS, GERD, and torticollis presented to ED 10/5 via EMS with neck pain that radiates to his jaw, emergently intubated by anesthesia due to respiratory failure following administration of pain medication.    STUDIES:  10/5  CT head and neck - B pleural effusions, R>L, severe torticollis, no mention of abscess, difficult exam due to anatomy  SIGNIFICANT EVENTS: 10/5  Presented to ED with neck pain radiating to jaw.  Medicated for pain, then had progressive distress.  Intubated in ER by Anesthesia   SUBJECTIVE: Pt sedated, intubated, responsive and denies pain. No family at bedside.   VITAL SIGNS: Temp:  [98 F (36.7 C)-99.3 F (37.4 C)] 99.2 F (37.3 C) (10/06 0351) Pulse Rate:  [25-167] 78 (10/06 0700) Resp:  [11-31] 11 (10/06 0700) BP: (83-170)/(47-110) 99/62 mmHg (10/06 0700) SpO2:  [68 %-100 %] 100 % (10/06 0700) FiO2 (%):  [40 %] 40 % (10/06 0400) Weight:  [135 lb (61.236 kg)-147 lb 14.9 oz (67.1 kg)] 147 lb 14.9 oz (67.1 kg) (10/06 0456)  HEMODYNAMICS:    VENTILATOR SETTINGS: Vent Mode:  [-] PRVC FiO2 (%):  [40 %] 40 % Set Rate:  [14 bmp] 14 bmp Vt Set:  [500 mL-550 mL] 550 mL PEEP:  [5 cmH20] 5 cmH20 Plateau Pressure:  [17 cmH20-23 cmH20] 20 cmH20  INTAKE / OUTPUT:  Intake/Output Summary (Last 24 hours) at 03/30/14 0827 Last data filed at 03/30/14 0800  Gross per 24 hour  Intake 1499.5 ml  Output   1455 ml  Net   44.5 ml    PHYSICAL EXAMINATION: General:  Elderly man in no distress Neuro:  RASS -1  HEENT:  Torticollis, intubated, NGT in R nare Cardiovascular:  irregulary irregular, HR 70-80s, no murmur or JVD.  Lungs: CTA bilaterally while on pressure support, no crackles Abdomen:  Soft,  non-tender, non-distended Musculoskeletal:  No edema Skin:  Warm, dry, petechiae noted B LE's  LABS:  CBC  Recent Labs Lab 03/29/14 1158 03/30/14 0240  WBC 4.0 4.4  HGB 11.7* 11.2*  HCT 35.2* 33.6*  PLT 121* 110*   Coag's  Recent Labs Lab 03/29/14 1423  INR 1.46   BMET  Recent Labs Lab 03/29/14 1158 03/30/14 0240  NA 140 137  K 4.4 4.2  CL 104 103  CO2 24 22  BUN 18 15  CREATININE 1.07 0.96  GLUCOSE 106* 97   Electrolytes  Recent Labs Lab 03/29/14 1158 03/30/14 0240  CALCIUM 8.6 8.1*   Sepsis Markers No results found for this basename: LATICACIDVEN, PROCALCITON, O2SATVEN,  in the last 168 hours  ABG  Recent Labs Lab 03/29/14 1339  PHART 7.329*  PCO2ART 47.2*  PO2ART 93.0   Liver Enzymes No results found for this basename: AST, ALT, ALKPHOS, BILITOT, ALBUMIN,  in the last 168 hours  Cardiac Enzymes No results found for this basename: TROPONINI, PROBNP,  in the last 168 hours  Glucose  Recent Labs Lab 03/29/14 1610 03/29/14 2030  GLUCAP 136* 99    Imaging Ct Soft Tissue Neck W Contrast  03/29/2014   CLINICAL DATA:  Neck pain radiating to the mandible beginning this morning.  EXAM: CT NECK WITH CONTRAST  TECHNIQUE: Multidetector CT imaging of the neck was  performed using the standard protocol following the bolus administration of intravenous contrast.  CONTRAST:  75 mL OMNIPAQUE IOHEXOL 300 MG/ML  SOLN  COMPARISON:  Head CT scan 10/08/2013. Cervical spine CT scan 09/26/2009.  FINDINGS: The study is severely limited due to the patient's torticollis. Intracranial contents appear normal. Major caliber vascular structures opacify. No mass is seen. Endotracheal tube is in place. No lymphadenopathy is identified. Lung apices demonstrate pleural effusions appearing greater on the right. Atherosclerotic vascular disease is identified. No lytic or sclerotic bony lesion is seen. The patient has severe multilevel cervical spondylosis. 0.6 cm  anterolisthesis C2 on C3 is identified, unchanged.  IMPRESSION: Markedly limited study demonstrates no acute abnormality of the neck.  Bilateral pleural effusions, greater on the right.  Atherosclerosis.  Severe torticollis and multilevel cervical spondylosis.   Electronically Signed   By: Inge Rise M.D.   On: 03/29/2014 14:47   Dg Chest Portable 1 View  03/29/2014   CLINICAL DATA:  Hypoxia; congestive heart failure  EXAM: PORTABLE CHEST - 1 VIEW  COMPARISON:  May 19, 2011  FINDINGS: Endotracheal tube tip is 1.0 cm above the carina. No pneumothorax apparent. There is cardiomegaly with mild bibasilar edema. There is consolidation in the medial left base. Elsewhere lungs are clear. No adenopathy. Pulmonary vascularity is within normal limits. No bone lesions.  IMPRESSION: Endotracheal tube as described without pneumothorax. Evidence of a degree of congestive heart failure. Consolidation medial left base.   Electronically Signed   By: Lowella Grip M.D.   On: 03/29/2014 13:36    ASSESSMENT / PLAN:  PULMONARY OETT 7 10/5>>> A: Acute Respiratory Failure - secondary to epiglottitis / UAO PNA - LLL consolidation on CXR; High aspiration risk (hx CVA, torticollis, GERD)  Pleural effusions - improving P:   Continue full vent support SBT daily Start solumedrol 60 q6h x 4 doses ENT consult, will likely need coordinated effort for extubation given potential for difficult airway, noted swelling on exam  Empiric abx, see ID Monitor CXR SLP swallow eval post extubation  CARDIOVASCULAR CVL n/a PIV x3 10/5>>> A:  Afib - chronic, rate controlled; on eliquis Hx CHF (EF 60-65% 09/2013) Hx PVD P:  Continue diltiazam Continue eliquis  RENAL A:   Chronic renal insufficiency - below baseline Cr (1.06-1.14) on admission P:   Monitor I/O Correct electrolytes as needed; holding home magnesium and KCl Holding home lasix, consider restarting if CXR indicates vascular congestion. D5 1/2 NS  with 20 mEq kcl at 50 ml/hr   GI/GU NGT 10/5>>> Foley 10/5>>> A:   GERD Diverticulosis Hx prostate cancer BPH P:  NPO  SUP: PPI Continue home doxazosin and finasteride per tube  HEMATOLOGIC A:  Anemia, mild - at baseline Thrombocytopenia - admission platelets 121, baseline wnl P:  Monitor CBC Cont home eliquis SCDs  INFECTIOUS A:   Left Infiltrate - concern for aspiration PNA Epiglottitis - CT neck negative for abscess  P:   BCx2  10/5 >> UC 10/5 >> U. Strep 10/5 >> neg Sputum 10/5 >> Flu Swab 10/5 >>  Abx: Unasyn 10/5 >>>  BC, UC, [place] pending Consult ENT as above, appreciate input  ENDOCRINE A:   Mild hyperglycemia  P:   Monitor glucose on BMP  NEUROLOGIC A:  Hx CVA - unknown baseline Torticollis P:   RASS at goal: -1 Fentanyl gtt PT/OT/SLP eval post extubation   POA Paulene Floor ([phone])  TODAY'S SUMMARY: 80 y/o M admitted to ICU through ED for respiratory failure (intubated by anesthesia)  in the setting of epiglottitis and concern for aspiration pneumonia. Contact POA, SBT, monitor CXR and continue abx. Await ENT consultant.                                  Ryan B. Bonner Puna, MD, PGY-2 03/30/2014 8:27 AM  35 minutes CC time   [name], MD, PhD 03/30/2014, 11:47 AM [facility] [phone] or if no answer [phone]

## 2014-03-30 NOTE — Progress Notes (Signed)
[facility] Progress Note Patient Name: MARQUELL SAENZ DOB: 08-01-1924 MRN: [id number]   Date of Service  03/30/2014  HPI/Events of Note  To OR tomorrow for potential trach.  eICU Interventions  Eliquis d/ced incase of surgery.        Chari Parmenter 03/30/2014, 3:41 PM

## 2014-03-31 ENCOUNTER — Encounter (HOSPITAL_COMMUNITY)
Admission: EM | Disposition: A | Discharge status: Skilled Nursing Facility | Payer: Self-pay | Source: Home / Self Care | Attending: Pulmonary Disease

## 2014-03-31 ENCOUNTER — Inpatient Hospital Stay (HOSPITAL_COMMUNITY): Payer: Medicare Other | Admitting: Anesthesiology

## 2014-03-31 ENCOUNTER — Encounter (HOSPITAL_COMMUNITY): Payer: Medicare Other | Admitting: Anesthesiology

## 2014-03-31 ENCOUNTER — Inpatient Hospital Stay (HOSPITAL_COMMUNITY): Payer: Medicare Other

## 2014-03-31 ENCOUNTER — Encounter (HOSPITAL_COMMUNITY): Payer: Self-pay | Admitting: Anesthesiology

## 2014-03-31 HISTORY — PX: DIRECT LARYNGOSCOPY: SHX5326

## 2014-03-31 LAB — BASIC METABOLIC PANEL
ANION GAP: 14 (ref 5–15)
BUN: 18 mg/dL (ref 6–23)
CHLORIDE: 101 meq/L (ref 96–112)
CO2: 21 meq/L (ref 19–32)
Calcium: 8 mg/dL — ABNORMAL LOW (ref 8.4–10.5)
Creatinine, Ser: 0.98 mg/dL (ref 0.50–1.35)
GFR calc Af Amer: 83 mL/min — ABNORMAL LOW (ref >90)
GFR calc non Af Amer: 72 mL/min — ABNORMAL LOW (ref >90)
Glucose, Bld: 140 mg/dL — ABNORMAL HIGH (ref 70–99)
POTASSIUM: 4 meq/L (ref 3.7–5.3)
Sodium: 136 mEq/L — ABNORMAL LOW (ref 137–147)

## 2014-03-31 LAB — GLUCOSE, CAPILLARY
GLUCOSE-CAPILLARY: 157 mg/dL — AB (ref 70–99)
GLUCOSE-CAPILLARY: 174 mg/dL — AB (ref 70–99)
GLUCOSE-CAPILLARY: 163 mg/dL — AB (ref 70–99)
GLUCOSE-CAPILLARY: 187 mg/dL — AB (ref 70–99)
Glucose-Capillary: 131 mg/dL — ABNORMAL HIGH (ref 70–99)
Glucose-Capillary: 136 mg/dL — ABNORMAL HIGH (ref 70–99)

## 2014-03-31 LAB — CBC
HEMATOCRIT: 32.2 % — AB (ref 39.0–52.0)
Hemoglobin: 10.9 g/dL — ABNORMAL LOW (ref 13.0–17.0)
MCH: 29.5 pg (ref 26.0–34.0)
MCHC: 33.9 g/dL (ref 30.0–36.0)
MCV: 87 fL (ref 78.0–100.0)
PLATELETS: 119 10*3/uL — AB (ref 150–400)
RBC: 3.7 MIL/uL — AB (ref 4.22–5.81)
RDW: 14.4 % (ref 11.5–15.5)
WBC: 2.8 10*3/uL — AB (ref 4.0–10.5)

## 2014-03-31 SURGERY — LARYNGOSCOPY, DIRECT
Anesthesia: General

## 2014-03-31 MED ORDER — DILTIAZEM 12 MG/ML ORAL SUSPENSION: 45.0000 mg | Freq: Four times a day (QID) | ORAL | Status: DC

## 2014-03-31 MED ORDER — DILTIAZEM LOAD VIA INFUSION: 10.0000 mg | Freq: Once | INTRAVENOUS | Status: DC

## 2014-03-31 MED ORDER — PROPOFOL 10 MG/ML IV BOLUS
INTRAVENOUS | Status: DC | PRN
  Administered 2014-03-31: 50 mg via INTRAVENOUS

## 2014-03-31 MED ORDER — DILTIAZEM HCL ER COATED BEADS 180 MG PO CP24: 180.0000 mg | ORAL_CAPSULE | Freq: Every day | ORAL | Status: DC

## 2014-03-31 MED ORDER — SODIUM CHLORIDE 0.9 % IV SOLN
2500.0000 ug | INTRAVENOUS | Status: DC | PRN
  Administered 2014-03-31: 100 ug/h via INTRAVENOUS

## 2014-03-31 MED ORDER — MAGNESIUM OXIDE 400 MG PO TABS: 400.0000 mg | ORAL_TABLET | Freq: Every day | ORAL | Status: DC

## 2014-03-31 MED ORDER — MIDAZOLAM HCL 2 MG/2ML IJ SOLN
INTRAMUSCULAR | Status: AC
  Filled 2014-03-31: qty 2

## 2014-03-31 MED ORDER — FENTANYL CITRATE 0.05 MG/ML IJ SOLN
INTRAMUSCULAR | Status: AC
  Filled 2014-03-31: qty 5

## 2014-03-31 MED ORDER — PNEUMOCOCCAL VAC POLYVALENT 25 MCG/0.5ML IJ INJ
0.5000 mL | INJECTION | INTRAMUSCULAR | Status: DC
  Filled 2014-03-31: qty 0.5

## 2014-03-31 MED ORDER — METOPROLOL TARTRATE 1 MG/ML IV SOLN
5.0000 mg | INTRAVENOUS | Status: AC | PRN
  Administered 2014-03-31 (×2): 5 mg via INTRAVENOUS
  Filled 2014-03-31 (×2): qty 5

## 2014-03-31 MED ORDER — FENTANYL CITRATE 0.05 MG/ML IJ SOLN
INTRAMUSCULAR | Status: DC | PRN
  Administered 2014-03-31: 100 ug via INTRAVENOUS

## 2014-03-31 MED ORDER — MAGNESIUM OXIDE 400 (241.3 MG) MG PO TABS
400.0000 mg | ORAL_TABLET | Freq: Every day | ORAL | Status: DC
  Administered 2014-04-01 – 2014-04-13 (×8): 400 mg via ORAL
  Filled 2014-03-31 (×13): qty 1

## 2014-03-31 MED ORDER — HALOPERIDOL LACTATE 5 MG/ML IJ SOLN
1.0000 mg | Freq: Four times a day (QID) | INTRAMUSCULAR | Status: DC | PRN
  Administered 2014-04-01: 4 mg via INTRAVENOUS
  Filled 2014-03-31: qty 1

## 2014-03-31 MED ORDER — MIDAZOLAM HCL 5 MG/5ML IJ SOLN
INTRAMUSCULAR | Status: DC | PRN
  Administered 2014-03-31: 1 mg via INTRAVENOUS

## 2014-03-31 MED ORDER — OXYMETAZOLINE HCL 0.05 % NA SOLN
NASAL | Status: AC
  Filled 2014-03-31: qty 15

## 2014-03-31 MED ORDER — LIDOCAINE-EPINEPHRINE 1 %-1:100000 IJ SOLN
INTRAMUSCULAR | Status: AC
  Filled 2014-03-31: qty 1

## 2014-03-31 MED ORDER — DILTIAZEM HCL 100 MG IV SOLR: 5.0000 mg/h | INTRAVENOUS | Status: DC

## 2014-03-31 MED ORDER — 0.9 % SODIUM CHLORIDE (POUR BTL) OPTIME
TOPICAL | Status: DC | PRN
  Administered 2014-03-31: 1000 mL

## 2014-03-31 MED ORDER — NALOXONE HCL 0.4 MG/ML IJ SOLN
INTRAMUSCULAR | Status: DC | PRN
  Administered 2014-03-31 (×2): 10 ug via INTRAVENOUS

## 2014-03-31 MED ORDER — FUROSEMIDE 20 MG PO TABS
20.0000 mg | ORAL_TABLET | Freq: Every day | ORAL | Status: DC
  Administered 2014-04-01 – 2014-04-13 (×10): 20 mg via ORAL
  Filled 2014-03-31 (×13): qty 1

## 2014-03-31 MED ORDER — SODIUM CHLORIDE 0.9 % IV SOLN
INTRAVENOUS | Status: DC | PRN
  Administered 2014-03-31 (×2): via INTRAVENOUS

## 2014-03-31 MED ORDER — LIDOCAINE HCL (CARDIAC) 20 MG/ML IV SOLN
INTRAVENOUS | Status: DC | PRN
  Administered 2014-03-31: 50 mg via INTRAVENOUS

## 2014-03-31 MED ORDER — DILTIAZEM 12 MG/ML ORAL SUSPENSION
45.0000 mg | Freq: Four times a day (QID) | ORAL | Status: DC
  Administered 2014-03-31 – 2014-04-01 (×5): 45 mg
  Filled 2014-03-31 (×9): qty 6

## 2014-03-31 MED ORDER — INFLUENZA VAC SPLIT QUAD 0.5 ML IM SUSY
0.5000 mL | PREFILLED_SYRINGE | INTRAMUSCULAR | Status: AC
  Administered 2014-04-01: 0.5 mL via INTRAMUSCULAR
  Filled 2014-03-31: qty 0.5

## 2014-03-31 SURGICAL SUPPLY — 34 items
BALLN PULM 15 16.5 18 X 75CM (BALLOONS)
BALLN PULM 15 16.5 18X75 (BALLOONS)
BALLOON PULM 15 16.5 18X75 (BALLOONS) IMPLANT
BLADE SURG 15 STRL LF DISP TIS (BLADE) IMPLANT
BLADE SURG 15 STRL SS (BLADE) ×3
CONT SPEC 4OZ CLIKSEAL STRL BL (MISCELLANEOUS) IMPLANT
COVER MAYO STAND STRL (DRAPES) ×2 IMPLANT
COVER TABLE BACK 60X90 (DRAPES) ×5 IMPLANT
CRADLE DONUT ADULT HEAD (MISCELLANEOUS) IMPLANT
DRAPE PROXIMA HALF (DRAPES) ×3 IMPLANT
ELECT COATED BLADE 2.86 ST (ELECTRODE) ×2 IMPLANT
ELECT LOOP CUT MONO 26F .012 R (MISCELLANEOUS) ×3 IMPLANT
GAUZE SPONGE 4X4 12PLY STRL (GAUZE/BANDAGES/DRESSINGS) IMPLANT
GAUZE SPONGE 4X4 16PLY XRAY LF (GAUZE/BANDAGES/DRESSINGS) ×2 IMPLANT
GLOVE ECLIPSE 8.0 STRL XLNG CF (GLOVE) ×3 IMPLANT
GLOVE SURG SS PI 6.5 STRL IVOR (GLOVE) ×4 IMPLANT
GOWN BRE IMP SLV AUR LG STRL (GOWN DISPOSABLE) IMPLANT
GOWN STRL REUS W/ TWL XL LVL3 (GOWN DISPOSABLE) ×1 IMPLANT
GOWN STRL REUS W/TWL XL LVL3 (GOWN DISPOSABLE) ×3
GUARD TEETH (MISCELLANEOUS) ×3 IMPLANT
KIT BASIN OR (CUSTOM PROCEDURE TRAY) ×3 IMPLANT
KIT ROOM TURNOVER OR (KITS) IMPLANT
NS IRRIG 1000ML POUR BTL (IV SOLUTION) ×3 IMPLANT
PAD ARMBOARD 7.5X6 YLW CONV (MISCELLANEOUS) IMPLANT
PATTIES SURGICAL .5 X3 (DISPOSABLE) IMPLANT
PENCIL BUTTON HOLSTER BLD 10FT (ELECTRODE) ×3 IMPLANT
SPECIMEN JAR SMALL (MISCELLANEOUS) IMPLANT
SPONGE INTESTINAL PEANUT (DISPOSABLE) IMPLANT
SURGILUBE 2OZ TUBE FLIPTOP (MISCELLANEOUS) IMPLANT
TOWEL OR 17X24 6PK STRL BLUE (TOWEL DISPOSABLE) ×3 IMPLANT
TRAP SPECIMEN MUCOUS 40CC (MISCELLANEOUS) IMPLANT
TUBE CONNECTING 12'X1/4 (SUCTIONS) ×2
TUBE CONNECTING 12X1/4 (SUCTIONS) ×3 IMPLANT
WATER STERILE IRR 1000ML POUR (IV SOLUTION) IMPLANT

## 2014-03-31 NOTE — Anesthesia Postprocedure Evaluation (Signed)
  Anesthesia Post-op Note  Patient: Eduardo Deleon  Procedure(s) Performed: Procedure(s): DIRECT LARYNGOSCOPY/EXAM UNDER ANESTHESIA (N/A)  Patient Location: PACU  Anesthesia Type:General  Level of Consciousness: awake and alert   Airway and Oxygen Therapy: Patient Spontanous Breathing  Post-op Pain: mild  Post-op Assessment: Post-op Vital signs reviewed and Patient's Cardiovascular Status Stable  Post-op Vital Signs: Reviewed and stable  Last Vitals:  Filed Vitals:   03/31/14 1309  BP:   Pulse:   Temp: 36.9 C  Resp:     Complications: No apparent anesthesia complications

## 2014-03-31 NOTE — Progress Notes (Signed)
PULMONARY / CRITICAL CARE MEDICINE   Name: Eduardo Deleon MRN: [id number] DOB: February 05, 1925    ADMISSION DATE:  03/29/2014 CONSULTATION DATE:  03/29/14  REFERRING MD :  ED physician  CHIEF COMPLAINT:  Neck pain  INITIAL PRESENTATION: 80 yo M with PMH of CVA, Afib, CHF, IBS, GERD, and torticollis presented to ED 10/5 via EMS with neck pain that radiates to his jaw, emergently intubated by anesthesia due to respiratory failure following administration of pain medication.    STUDIES:  10/5  CT head and neck - B pleural effusions, R>L, severe torticollis, no mention of abscess, difficult exam due to anatomy 10/5  CXR L medial basilar consolidation vs. atelectasis. Pulmonary edema. 10/6  CXR LLL atelectasis and/or infiltrate. Pulmonary edema clearing. 10/7 CXR  Bibasilar subsegmental atelectasis. No pulmonary edema. Stable cardiomegaly.   SIGNIFICANT EVENTS: 10/5  Presented to ED with neck pain radiating to jaw.  Medicated for pain, then had progressive distress.  Intubated in ER by Anesthesia.  10/7  EUA in OR with ENT  SUBJECTIVE: Pt sedated, intubated, no longer agitated. No family at bedside this AM. Spoke with [name] yesterday who described a long history of attempting SNF placement. Pt had 24 hr nursing care prior to arrival.   VITAL SIGNS: Temp:  [98.4 F (36.9 C)-99.5 F (37.5 C)] 98.7 F (37.1 C) (10/07 0339) Pulse Rate:  [49-190] 123 (10/07 0700) Resp:  [8-28] 15 (10/07 0700) BP: (80-152)/(50-122) 115/79 mmHg (10/07 0700) SpO2:  [95 %-100 %] 96 % (10/07 0700) FiO2 (%):  [40 %] 40 % (10/07 0419) Weight:  [142 lb 10.2 oz (64.7 kg)] 142 lb 10.2 oz (64.7 kg) (10/07 0500)  HEMODYNAMICS:    VENTILATOR SETTINGS: Vent Mode:  [-] PRVC FiO2 (%):  [40 %] 40 % Set Rate:  [14 bmp] 14 bmp Vt Set:  [550 mL] 550 mL PEEP:  [5 cmH20] 5 cmH20 Pressure Support:  [10 cmH20-15 cmH20] 15 cmH20 Plateau Pressure:  [20 EHO12-24 cmH20] 21 cmH20  INTAKE / OUTPUT:  Intake/Output Summary  (Last 24 hours) at 03/31/14 8250 Last data filed at 03/31/14 0500  Gross per 24 hour  Intake 1812.79 ml  Output   1415 ml  Net 397.79 ml    PHYSICAL EXAMINATION: General:  Frail elderly man in no distress Neuro:  RASS -1  HEENT:  Severe torticollis with clockwise rotation, intubated, ETT, NGT in R nare Cardiovascular:  irregulary irregular, HR 70-80s, no murmur or JVD.  Lungs: CTA bilaterally while on pressure support, no crackles Abdomen:  Soft, non-tender, non-distended Musculoskeletal:  No edema Skin:  Warm, dry, petechiae noted B LE's  LABS:  CBC  Recent Labs Lab 03/29/14 1158 03/30/14 0240 03/31/14 0219  WBC 4.0 4.4 2.8*  HGB 11.7* 11.2* 10.9*  HCT 35.2* 33.6* 32.2*  PLT 121* 110* 119*   Coag's  Recent Labs Lab 03/29/14 1423  INR 1.46   BMET  Recent Labs Lab 03/29/14 1158 03/30/14 0240 03/31/14 0219  NA 140 137 136*  K 4.4 4.2 4.0  CL 104 103 101  CO2 24 22 21   BUN 18 15 18   CREATININE 1.07 0.96 0.98  GLUCOSE 106* 97 140*   Electrolytes  Recent Labs Lab 03/29/14 1158 03/30/14 0240 03/31/14 0219  CALCIUM 8.6 8.1* 8.0*   Sepsis Markers No results found for this basename: LATICACIDVEN, PROCALCITON, O2SATVEN,  in the last 168 hours  ABG  Recent Labs Lab 03/29/14 1339  PHART 7.329*  PCO2ART 47.2*  PO2ART 93.0   Liver Enzymes  No results found for this basename: AST, ALT, ALKPHOS, BILITOT, ALBUMIN,  in the last 168 hours  Cardiac Enzymes No results found for this basename: TROPONINI, PROBNP,  in the last 168 hours  Glucose  Recent Labs Lab 03/29/14 1610 03/29/14 2030 03/30/14 1606 03/30/14 1946 03/31/14 0027 03/31/14 0341  GLUCAP 136* 99 122* 168* 131* 136*    Imaging Dg Chest Port 1 View  03/30/2014   CLINICAL DATA:  Intubation.  EXAM: PORTABLE CHEST - 1 VIEW  COMPARISON:  03/29/2014.  FINDINGS: Endotracheal tube tip again 1 cm from the chronic, proximal repositioning should be considered. NG tube in stable position.  Cardiomegaly. Normal pulmonary vascularity. Previously identified pulmonary edema has almost completely cleared. Left lower lobe atelectasis and/or infiltrate remains. No pleural effusion or pneumothorax.  IMPRESSION: 1. Endotracheal tube in stable position 1 cm above the carina, proximal repositioning should be considered. NG tube in stable position with tip below the hemidiaphragm. 2. Interim near complete clearing of pulmonary edema. Stable cardiomegaly. Pulmonary vascularity is normal. 3. Persistent left lower lobe atelectasis and/or infiltrate.   Electronically Signed   By: Marcello Moores  Register   On: 03/30/2014 07:23   Dg Abd Portable 1v  03/30/2014   CLINICAL DATA:  Nasogastric tube placement; gastroesophageal reflux  EXAM: PORTABLE ABDOMEN - 1 VIEW  COMPARISON:  None.  FINDINGS: Nasogastric tube tip and side port are in the stomach. There is moderate stool throughout colon. Bowel gas pattern unremarkable. There is scoliosis and arthropathy in the lumbar spine.  IMPRESSION: Nasogastric tube tip and side-port in the stomach.   Electronically Signed   By: Lowella Grip M.D.   On: 03/30/2014 11:46    ASSESSMENT / PLAN:  PULMONARY OETT 7 10/5>>> A: Acute Respiratory Failure - secondary to epiglottic edema / UAO PNA - LLL consolidation vs. atelectasis on CXR - improving High aspiration risk (hx CVA, torticollis, GERD)  Pleural effusions - resolved  P:   Continue CPAP ENT consult with EUA with extubation and possible trach today S/p solumedrol 60mg  x 4 doses Empiric abx, see ID Monitor CXR SLP swallow eval post extubation  CARDIOVASCULAR CVL n/a PIV x3 10/5>>> A:  Afib - chronic, rate controlled on daily dilt; on eliquis Hx CHF (EF 60-65% 09/2013) Hx PVD P:  Continue diltiazem per tube - intermittent tachycardia only with agitation Holding eliquis - restart on return from OR  RENAL A:   Chronic renal insufficiency - below baseline Cr (1.06-1.14) on admission P:   Monitor I/O -  UOP good (0.9 cc/kg/hr) Correct electrolytes as needed; holding home magnesium and KCl Holding home lasix - consider restarting if CXR indicates vascular congestion. D5 1/2 NS with 20 mEq kcl at 50 ml/hr - wean to [facility] once TFs back to goal.   GI/GU NGT 10/5>>> Foley 10/5>>> A:   GERD Diverticulosis Hx prostate cancer BPH P:  NPO  SUP: PPI Continue home doxazosin and finasteride per tube  HEMATOLOGIC A:  Anemia, mild - at baseline, stable Thrombocytopenia - admission platelets 121, baseline wnl P:  Monitor CBC - stable Hold eliquis pre-op SCDs  INFECTIOUS A:   Left Infiltrate vs. atelectasis - concern for aspiration PNA Laryngeal edema r/o epiglottitis - CT neck negative for abscess  P:   BCx2  10/5 >> UC 10/5 >> Sputum 10/5 >> RVP 10/5 >>  Abx: Unasyn 10/5 >>> Direct laryngoscopy with ENT in OR today  ENDOCRINE A:   Mild hyperglycemia  P:   Monitor glucose on BMP Consider SSI with  steroid-induced hyperglycemia   NEUROLOGIC A:  Hx CVA  Torticollis P:   RASS at goal: -1 Fentanyl gtt PT/OT/SLP eval post extubation  TODAY'S SUMMARY: EUA with laryngoscopy and possible tracheostomy in OR at noon.  Monitor CXR, follow cultures and continue abx.                                  Ryan B. Bonner Puna, MD, PGY-2 03/31/2014 7:22 AM  CC time 35 minutes  [name], MD, PhD 03/31/2014, 10:12 AM [facility] [phone] or if no answer [phone]

## 2014-03-31 NOTE — Anesthesia Preprocedure Evaluation (Addendum)
Anesthesia Evaluation  Patient identified by MRN, date of birth, ID band Patient awake    Reviewed: Allergy & Precautions, H&P , NPO status , Patient's Chart, lab work & pertinent test results  Airway       Dental   Pulmonary  Intubated in ER, swollen epiglottis noted, Epiglottitis RO, for DL and poss trac         Cardiovascular  Chronic AF, ECHO 4/15 AF, EF 65%   Neuro/Psych    GI/Hepatic GERD-  Medicated,  Endo/Other    Renal/GU CREAT .8 now, GFR ok hx prostate CA      Musculoskeletal   Abdominal   Peds  Hematology   Anesthesia Other Findings Pt sedated and on mech vent in ICU.  IV Fentanyl infusion going and has hada bolus of IV fentanyl in ICU  Reproductive/Obstetrics                         Anesthesia Physical Anesthesia Plan  ASA: III  Anesthesia Plan: General   Post-op Pain Management:    Induction: Intravenous  Airway Management Planned: Oral ETT  Additional Equipment:   Intra-op Plan:   Post-operative Plan:   Informed Consent: I have reviewed the patients History and Physical, chart, labs and discussed the procedure including the risks, benefits and alternatives for the proposed anesthesia with the patient or authorized representative who has indicated his/her understanding and acceptance.     Plan Discussed with:   Anesthesia Plan Comments: (On vent, intubated, for EUA, DL, possible TRAC, Epiglottitis?)        Anesthesia Quick Evaluation

## 2014-03-31 NOTE — Progress Notes (Signed)
Dr. Erik Obey d/c'd pt's antibiotics after pt was extubated in OR today. Per pharmacy's recommendation, I called Dr. Nelda Marseille in [place] to verify this order. Dr. Nelda Marseille suggested that CCM address this tomorrow on rounds.

## 2014-03-31 NOTE — Op Note (Signed)
03/31/2014  3:01 PM    Cherre Robins  [id number]   Pre-Op Dx:  Epiglottitis with obstruction  Post-op Dx: same  Proc: Direct Laryngoscopy, Extubation under anesthesia   Surg:  [name] T MD  Anes:  GOT  EBL:  none  Comp:  none  Findings:  Severe torticollis.  Basically normal larynx with bilateral mobile vocal cords.  Procedure: with the patient intubated and transferred from 2100, he was transferred onto the operating table.  General anesthesia was administered per indwelling orotracheal tube.  At an appropriate level, the table was turned 90 degrees.  An upper partial plate was removed.  A rubber tooth guard was placed.    Taking care to protect lips, tongue, and ET tube, the Hollinger laryngoscope was introduced with the findings as described above.  No contraindication to extubation was noted.  Under direct vision, anesthesia was allowed to reverse, and the Fentanyl was reversed as well.  The patient developed active ventilation, and then vocal cord motion.  Under direct visualization, the pt was extubated.  Good air movement, mobile vocal cords, and airway protection were noted.  Good O2 sats were noted.    Pt was returned to Anesthesia, then transferred back to 2100 ICU.  Dispo:   OR to 2100  Plan:  Can probably discontinue antibiotics.  Await more history from patient now that he will be conversant.   Tyson Alias MD

## 2014-03-31 NOTE — OR Nursing (Signed)
MICU called [phone] to notify of returning patient to room 16.

## 2014-03-31 NOTE — Clinical Documentation Improvement (Signed)
  80 year old white male admitted with epiglottitis and acute respiratory failure requiring intubation.  To provide greater specificity in the current MEDICAL RECORD NUMBER Please document the ACUITY of the epiglottitis (Acute or Chronic).  Please document if the epiglottitis was WITH or WITHOUT obstruction.  Thank You, Erling Conte ,RN Clinical Documentation Specialist [phone] [employer] [facility]

## 2014-03-31 NOTE — Transfer of Care (Signed)
Immediate Anesthesia Transfer of Care Note  Patient: Eduardo Deleon  Procedure(s) Performed: Procedure(s): DIRECT LARYNGOSCOPY/EXAM UNDER ANESTHESIA (N/A)  Patient Location: PACU  Anesthesia Type:General  Level of Consciousness: awake and responds to stimulation  Airway & Oxygen Therapy: Patient Spontanous Breathing and Patient connected to face mask oxygen  Post-op Assessment: Post -op Vital signs reviewed and stable, Patient moving all extremities and report to MICU RN  Post vital signs: Reviewed and stable  Complications: No apparent anesthesia complications

## 2014-04-01 ENCOUNTER — Encounter (HOSPITAL_COMMUNITY): Payer: Self-pay | Admitting: Otolaryngology

## 2014-04-01 ENCOUNTER — Inpatient Hospital Stay (HOSPITAL_COMMUNITY): Payer: Medicare Other

## 2014-04-01 DIAGNOSIS — J9600 Acute respiratory failure, unspecified whether with hypoxia or hypercapnia: Secondary | ICD-10-CM | POA: Diagnosis not present

## 2014-04-01 LAB — CBC WITH DIFFERENTIAL/PLATELET
Basophils Absolute: 0 10*3/uL (ref 0.0–0.1)
Basophils Relative: 0 % (ref 0–1)
EOS ABS: 0 10*3/uL (ref 0.0–0.7)
Eosinophils Relative: 0 % (ref 0–5)
HCT: 34.3 % — ABNORMAL LOW (ref 39.0–52.0)
HEMOGLOBIN: 11.6 g/dL — AB (ref 13.0–17.0)
Lymphocytes Relative: 9 % — ABNORMAL LOW (ref 12–46)
Lymphs Abs: 0.6 10*3/uL — ABNORMAL LOW (ref 0.7–4.0)
MCH: 29.7 pg (ref 26.0–34.0)
MCHC: 33.8 g/dL (ref 30.0–36.0)
MCV: 87.9 fL (ref 78.0–100.0)
MONOS PCT: 6 % (ref 3–12)
Monocytes Absolute: 0.3 10*3/uL (ref 0.1–1.0)
NEUTROS ABS: 5.1 10*3/uL (ref 1.7–7.7)
NEUTROS PCT: 85 % — AB (ref 43–77)
PLATELETS: 140 10*3/uL — AB (ref 150–400)
RBC: 3.9 MIL/uL — ABNORMAL LOW (ref 4.22–5.81)
RDW: 14.6 % (ref 11.5–15.5)
WBC: 6 10*3/uL (ref 4.0–10.5)

## 2014-04-01 LAB — BASIC METABOLIC PANEL
Anion gap: 11 (ref 5–15)
BUN: 20 mg/dL (ref 6–23)
CHLORIDE: 105 meq/L (ref 96–112)
CO2: 21 mEq/L (ref 19–32)
Calcium: 8.4 mg/dL (ref 8.4–10.5)
Creatinine, Ser: 0.98 mg/dL (ref 0.50–1.35)
GFR, EST AFRICAN AMERICAN: 83 mL/min — AB (ref >90)
GFR, EST NON AFRICAN AMERICAN: 72 mL/min — AB (ref >90)
Glucose, Bld: 111 mg/dL — ABNORMAL HIGH (ref 70–99)
POTASSIUM: 3.9 meq/L (ref 3.7–5.3)
SODIUM: 137 meq/L (ref 137–147)

## 2014-04-01 LAB — GLUCOSE, CAPILLARY
GLUCOSE-CAPILLARY: 133 mg/dL — AB (ref 70–99)
GLUCOSE-CAPILLARY: 140 mg/dL — AB (ref 70–99)
Glucose-Capillary: 121 mg/dL — ABNORMAL HIGH (ref 70–99)
Glucose-Capillary: 109 mg/dL — ABNORMAL HIGH (ref 70–99)

## 2014-04-01 LAB — CULTURE, RESPIRATORY

## 2014-04-01 LAB — CULTURE, RESPIRATORY W GRAM STAIN

## 2014-04-01 MED ORDER — RESOURCE THICKENUP CLEAR PO POWD
ORAL | Status: DC | PRN
  Filled 2014-04-01 (×2): qty 125

## 2014-04-01 MED ORDER — POTASSIUM CHLORIDE CRYS ER 10 MEQ PO TBCR
10.0000 meq | EXTENDED_RELEASE_TABLET | Freq: Every day | ORAL | Status: DC
  Administered 2014-04-01 – 2014-04-05 (×3): 10 meq via ORAL
  Filled 2014-04-01 (×6): qty 1

## 2014-04-01 MED ORDER — DILTIAZEM HCL ER COATED BEADS 180 MG PO CP24
180.0000 mg | ORAL_CAPSULE | Freq: Every day | ORAL | Status: DC
  Administered 2014-04-02 – 2014-04-13 (×10): 180 mg via ORAL
  Filled 2014-04-01 (×13): qty 1

## 2014-04-01 MED ORDER — APIXABAN 2.5 MG PO TABS
2.5000 mg | ORAL_TABLET | Freq: Two times a day (BID) | ORAL | Status: DC
  Administered 2014-04-01 – 2014-04-05 (×7): 2.5 mg via ORAL
  Filled 2014-04-01 (×13): qty 1

## 2014-04-01 NOTE — Evaluation (Signed)
Clinical/Bedside Swallow Evaluation Patient Details  Name: Eduardo Deleon MRN: [id number] Date of Birth: 1924-10-05  Today's Date: 04/01/2014 Time: 1130-1200 SLP Time Calculation (min): 30 min  Past Medical History:  Past Medical History  Diagnosis Date  . 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 Atrial fibrillation     chronic anticaog - weintraub  . Congestive heart failure   . Anemia   . Osteoarthritis   . Splenic lesion   . Venous stasis dermatitis     hx venous ulcer/cellulitis 06/2011 R and 04/2011 L  . Prostate cancer   . Anal fissure     Hx of  . Internal hemorrhoids   . Diverticulosis 04/12/1998    Left colon--Flex Sig  . CRI (chronic renal insufficiency)   . GERD (gastroesophageal reflux disease)   . PVD (peripheral vascular disease)   . Torticollis, acquired   . Tubular adenoma    Past Surgical History:  Past Surgical History  Procedure Laterality Date  . Joint replacement  bilaterial knees  . Lumbar laminectomy  Per patient  heriated disc in mid spine  . Hernia repair      [phone], right - 1985  . Hemorrhoid surgery    . Tonsillectomy    . Colonoscopy      1988   HPI:  80 yo M with PMH of CVA (1978), Afib, CHF, IBS, GERD, and torticollis presented to ED 10/5 via EMS with neck pain that radiates to his jaw, emergently intubated by anesthesia due to respiratory failure following administration of pain medication. OETT 7 10/5>>>10/7.  Extubated per ENT under direct laryngoscopy.  Noted with severe torticollis. Basically normal larynx with bilateral mobile vocal cords.  Continues with NG today.   Pt describes hx of "trouble swallowing" that worsens when he becomes ill.    Assessment / Plan / Recommendation Clinical Impression  Pt presents with deterioration in swallow, likely transitory in nature, secondary to deconditioning and intubation.  Pt states he has had increased difficulty in swallowing as he has aged; current medical issues have caused a likely temporary  decompensation.  Presents today with signs of pharyngeal weakness with repetitive swallowing required to transfer a single soft bolus, coughing with liquids.  NG has been removed. Recommend initiating a dysphagia 1 diet, pudding thick liquids for now; SLP to follow for readiness for diet advancement; FEES if warranted.     Aspiration Risk  Moderate    Diet Recommendation Dysphagia 1 (Puree);Pudding-thick liquid   Liquid Administration via: Spoon Medication Administration: Crushed with puree Supervision: Full supervision/cueing for compensatory strategies;Trained caregiver to feed patient Postural Changes and/or Swallow Maneuvers: Seated upright 90 degrees    Other  Recommendations Recommended Consults:  (potential FEES) Oral Care Recommendations: Oral care BID Other Recommendations: Order thickener from pharmacy   Follow Up Recommendations   (tba)    Frequency and Duration min 3x week  2 weeks    Swallow Study Prior Functional Status       General Date of Onset: 03/29/14 HPI: 80 yo M with PMH of CVA (1978), Afib, CHF, IBS, GERD, and torticollis presented to ED 10/5 via EMS with neck pain that radiates to his jaw, emergently intubated by anesthesia due to respiratory failure following administration of pain medication. OETT 7 10/5>>>10/7.  Extubated per ENT under direct laryngoscopy.  Noted with severe torticollis. Basically normal larynx with bilateral mobile vocal cords.  Continues with NG today.   Pt describes hx of "trouble swallowing" that worsens when he becomes ill.  Type of Study: Bedside swallow evaluation Previous Swallow Assessment: none per records Diet Prior to this Study: NPO Temperature Spikes Noted: No Respiratory Status: Nasal cannula History of Recent Intubation: Yes Length of Intubations (days): 2 days Date extubated: 03/31/14 Behavior/Cognition: Alert;Cooperative;Confused Oral Cavity - Dentition: Missing dentition Self-Feeding Abilities: Needs assist Patient  Positioning: Upright in bed Baseline Vocal Quality: Hoarse Volitional Cough: Strong Volitional Swallow: Able to elicit    Oral/Motor/Sensory Function Overall Oral Motor/Sensory Function: Appears within functional limits for tasks assessed   Ice Chips Ice chips: Impaired Presentation: Spoon Pharyngeal Phase Impairments: Cough - Delayed   Thin Liquid Thin Liquid: Impaired Presentation: Spoon Pharyngeal  Phase Impairments: Wet Vocal Quality;Multiple swallows    Nectar Thick Nectar Thick Liquid: Not tested   Honey Thick Honey Thick Liquid: Not tested   Puree Puree: Impaired Presentation: Spoon Pharyngeal Phase Impairments: Multiple swallows   Solid  [name], [place] CCC/SLP Pager [phone]     Solid: Not tested       [name] 04/01/2014,12:31 PM

## 2014-04-01 NOTE — Progress Notes (Signed)
110 ml of Fentanyl gtt and 7 ml of Versed gtt wasted in sink. Witnessed by Allegra Grana RN.

## 2014-04-01 NOTE — Progress Notes (Signed)
ANTICOAGULATION CONSULT NOTE - Initial Consult  Pharmacy Consult for Eliquis Indication: atrial fibrillation  No Known Allergies  Patient Measurements: Height: 5\' 6"  (167.6 cm) Weight: 144 lb 10 oz (65.6 kg) IBW/kg (Calculated) : 63.8  Vital Signs: Temp: 98.4 F (36.9 C) (10/08 0752) Temp Source: Oral (10/08 1100) BP: 128/83 mmHg (10/08 1100) Pulse Rate: 92 (10/08 1100)  Labs:  Recent Labs  03/29/14 1158 03/29/14 1423 03/30/14 0240 03/31/14 0219 04/01/14 1020  HGB 11.7*  --  11.2* 10.9* 11.6*  HCT 35.2*  --  33.6* 32.2* 34.3*  PLT 121*  --  110* 119* 140*  LABPROT  --  17.7*  --   --   --   INR  --  1.46  --   --   --   CREATININE 1.07  --  0.96 0.98  --     Estimated Creatinine Clearance: 47.9 ml/min (by C-G formula based on Cr of 0.98).   Medical History: Past Medical History  Diagnosis Date  . 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 Atrial fibrillation     chronic anticaog - weintraub  . Congestive heart failure   . Anemia   . Osteoarthritis   . Splenic lesion   . Venous stasis dermatitis     hx venous ulcer/cellulitis 06/2011 R and 04/2011 L  . Prostate cancer   . Anal fissure     Hx of  . Internal hemorrhoids   . Diverticulosis 04/12/1998    Left colon--Flex Sig  . CRI (chronic renal insufficiency)   . GERD (gastroesophageal reflux disease)   . PVD (peripheral vascular disease)   . Torticollis, acquired   . Tubular adenoma     Medications:  Scheduled:  . antiseptic oral rinse  7 mL Mouth Rinse QID  . apixaban  2.5 mg Oral BID  . chlorhexidine  15 mL Mouth Rinse BID  . cholecalciferol  2,000 Units Oral Daily  . diltiazem  45 mg Per Tube 4 times per day  . docusate sodium  100 mg Oral Daily  . doxazosin  2 mg Oral QHS  . furosemide  20 mg Oral Daily  . magnesium oxide  400 mg Oral Daily  . pantoprazole sodium  40 mg Per Tube Daily  . [START ON 04/03/2014] pneumococcal 23 valent vaccine  0.5 mL Intramuscular Tomorrow-1000  . potassium chloride  10  mEq Oral Daily    Assessment: 89 YOM presented on 10/5 with neck pain, was given morphine and began to have respiratory distress and required intubation for airway protection.  He has a history of atrial fibrillation on Eliquis for anticoagulation. There was concern for epiglottitis and required extubation in the OR with larygnoscopy on 10/7, so Eliquis was held.  Pharmacy is consulted to restart Eliquis. Wt 65 kg, SCr 0.98. H/H and platelets stable, no signs of bleeding.  Goal of Therapy:  Monitor platelets by anticoagulation protocol: Yes   Plan:  -Because age>80, SCr<1.5, and Wt near 60kg, restart Eliquis 2.5 mg po BID.  -Monitor renal function, CBC, signs of bleeding  Whitney Muse, PharmD Candidate 04/01/2014,11:14 AM   Agree with above assessment and plan. Will continue to monitor.   Sloan Leiter, PharmD, BCPS Clinical Pharmacist [phone] 04/01/2014, 2:09 PM

## 2014-04-01 NOTE — Progress Notes (Signed)
04/01/2014 10:19 AM  Eduardo Deleon [id number]  Post-Op Day 1    Temp:  [98.1 F (36.7 C)-99 F (37.2 C)] 98.4 F (36.9 C) (10/08 0752) Pulse Rate:  [62-187] 97 (10/08 0900) Resp:  [13-26] 23 (10/08 0900) BP: (101-150)/(61-113) 127/72 mmHg (10/08 0900) SpO2:  [96 %-100 %] 98 % (10/08 0900) FiO2 (%):  [40 %] 40 % (10/07 1300) Weight:  [65.6 kg (144 lb 10 oz)] 65.6 kg (144 lb 10 oz) (10/08 0415),     Intake/Output Summary (Last 24 hours) at 04/01/14 1019 Last data filed at 04/01/14 0900  Gross per 24 hour  Intake 1950.83 ml  Output    885 ml  Net 1065.83 ml    Results for orders placed during the hospital encounter of 03/29/14 (from the past 24 hour(s))  GLUCOSE, CAPILLARY     Status: Abnormal   Collection Time    03/31/14  1:03 PM      Result Value Ref Range   Glucose-Capillary 174 (*) 70 - 99 mg/dL  GLUCOSE, CAPILLARY     Status: Abnormal   Collection Time    03/31/14  3:50 PM      Result Value Ref Range   Glucose-Capillary 163 (*) 70 - 99 mg/dL  GLUCOSE, CAPILLARY     Status: Abnormal   Collection Time    03/31/14  7:25 PM      Result Value Ref Range   Glucose-Capillary 187 (*) 70 - 99 mg/dL  GLUCOSE, CAPILLARY     Status: Abnormal   Collection Time    04/01/14 12:36 AM      Result Value Ref Range   Glucose-Capillary 140 (*) 70 - 99 mg/dL   Comment 1 Documented in Chart     Comment 2 Notify RN    GLUCOSE, CAPILLARY     Status: Abnormal   Collection Time    04/01/14  3:45 AM      Result Value Ref Range   Glucose-Capillary 133 (*) 70 - 99 mg/dL   Comment 1 Documented in Chart     Comment 2 Notify RN    GLUCOSE, CAPILLARY     Status: Abnormal   Collection Time    04/01/14  7:44 AM      Result Value Ref Range   Glucose-Capillary 121 (*) 70 - 99 mg/dL    SUBJECTIVE:  Confused.    OBJECTIVE:  Breathing well.  Voice OK.   IMPRESSION:  Satisfactory check with adequate airway  PLAN:  No special advice from me.  I suspect he probably does not need an NG  tube any longer.  [name]

## 2014-04-01 NOTE — Progress Notes (Signed)
Pt transferred to 3E19. RN and NT present at bedside. Pt placed on telemetry. All questions answered. Pt VSS, no signs of distress, no C/O pain.

## 2014-04-01 NOTE — Progress Notes (Addendum)
PULMONARY / CRITICAL CARE MEDICINE   Name: Eduardo Deleon MRN: [id number] DOB: [birth year]    ADMISSION DATE:  03/29/2014 CONSULTATION DATE:  03/29/14  REFERRING MD :  ED physician  CHIEF COMPLAINT:  Neck pain  INITIAL PRESENTATION: 80 yo M with PMH of CVA, Afib, CHF, IBS, GERD, and torticollis presented to ED 10/5 via EMS with neck pain that radiates to his jaw, emergently intubated by anesthesia due to respiratory failure following administration of pain medication.    STUDIES:  10/5  CT head and neck - B pleural effusions, R>L, severe torticollis, no mention of abscess, difficult exam due to anatomy 10/5  CXR L medial basilar consolidation vs. atelectasis. Pulmonary edema. 10/6  CXR LLL atelectasis and/or infiltrate. Pulmonary edema clearing. 10/7 CXR  Bibasilar subsegmental atelectasis. No pulmonary edema. Stable cardiomegaly.   SIGNIFICANT EVENTS: 10/5  Presented to ED with neck pain radiating to jaw.  Medicated for pain, then had progressive distress.  Intubated in ER by Anesthesia.  10/7  Extubated in OR with ENT: no laryngeal edema noted.  SUBJECTIVE: Pt awake denies pain. Sore throat.   VITAL SIGNS: Temp:  [98.1 F (36.7 C)-99 F (37.2 C)] 99 F (37.2 C) (10/08 0343) Pulse Rate:  [39-187] 98 (10/08 0700) Resp:  [11-25] 20 (10/08 0700) BP: (101-150)/(61-113) 119/86 mmHg (10/08 0700) SpO2:  [96 %-100 %] 97 % (10/08 0700) FiO2 (%):  [40 %] 40 % (10/07 1300) Weight:  [144 lb 10 oz (65.6 kg)] 144 lb 10 oz (65.6 kg) (10/08 0415)  HEMODYNAMICS:    VENTILATOR SETTINGS:  INTAKE / OUTPUT:  Intake/Output Summary (Last 24 hours) at 04/01/14 0738 Last data filed at 04/01/14 0700  Gross per 24 hour  Intake 2231.33 ml  Output    875 ml  Net 1356.33 ml   PHYSICAL EXAMINATION: General:  Frail elderly man in no distress Neuro:  RASS 0; alert, slow appropriate speech. Some confusion HEENT:  Severe torticollis with clockwise rotation and leftward deviation, NGT in R  nare Cardiovascular: irregulary irregular, HR 100s, no murmur or JVD.  Lungs: Significant UA noise transmitted without stridor, no crackles Abdomen:  Soft, non-tender, non-distended Musculoskeletal:  No edema Skin:  Warm, dry, petechiae noted B LE's  LABS:  CBC  Recent Labs Lab 03/29/14 1158 03/30/14 0240 03/31/14 0219  WBC 4.0 4.4 2.8*  HGB 11.7* 11.2* 10.9*  HCT 35.2* 33.6* 32.2*  PLT 121* 110* 119*   Coag's  Recent Labs Lab 03/29/14 1423  INR 1.46   BMET  Recent Labs Lab 03/29/14 1158 03/30/14 0240 03/31/14 0219  NA 140 137 136*  K 4.4 4.2 4.0  CL 104 103 101  CO2 24 22 21   BUN 18 15 18   CREATININE 1.07 0.96 0.98  GLUCOSE 106* 97 140*   Electrolytes  Recent Labs Lab 03/29/14 1158 03/30/14 0240 03/31/14 0219  CALCIUM 8.6 8.1* 8.0*   Sepsis Markers No results found for this basename: LATICACIDVEN, PROCALCITON, O2SATVEN,  in the last 168 hours  ABG  Recent Labs Lab 03/29/14 1339  PHART 7.329*  PCO2ART 47.2*  PO2ART 93.0   Liver Enzymes No results found for this basename: AST, ALT, ALKPHOS, BILITOT, ALBUMIN,  in the last 168 hours  Cardiac Enzymes No results found for this basename: TROPONINI, PROBNP,  in the last 168 hours  Glucose  Recent Labs Lab 03/31/14 0341 03/31/14 0851 03/31/14 1303 03/31/14 1550 03/31/14 1925 04/01/14 0036  GLUCAP 136* 157* 174* 163* 187* 140*    Imaging Dg Chest Portable  1 View  03/31/2014   CLINICAL DATA:  Respiratory distress.  Shortness of breath.  EXAM: PORTABLE CHEST - 1 VIEW  COMPARISON:  Chest x-ray 03/30/2014.  FINDINGS: Endotracheal tube tip 1 cm above the carina. Again proximal repositioning should be considered. NG tube in stable position with tip below the left hemidiaphragm. Cardiomegaly with normal pulmonary vascularity. Mild basilar atelectasis again noted. No significant pulmonary edema. No pleural effusion or pneumothorax. No acute osseus abnormality.  IMPRESSION: 1. Endotracheal tube tip  again noted 1 cm above the carina, repositioning should be considered. NG tube in stable position. 2. Stable cardiomegaly, no pulmonary edema. 3. Stable bibasilar subsegmental atelectasis.   Electronically Signed   By: Marcello Moores  Register   On: 03/31/2014 07:56   Dg Abd Portable 1v  03/31/2014   CLINICAL DATA:  Nasogastric tube placement  EXAM: PORTABLE ABDOMEN - 1 VIEW  COMPARISON:  March 30, 2014  FINDINGS: Nasogastric tube tip and side port are in the body of the stomach. There remains generalized bowel dilatation. No free air apparent on this supine examination. There is scoliosis and degenerative type change in the lumbar spine.  IMPRESSION: Nasogastric tube tip and side port in stomach. Bowel remains dilated.   Electronically Signed   By: Lowella Grip M.D.   On: 03/31/2014 18:50    ASSESSMENT / PLAN:  PULMONARY OETT 7 10/5>>>10/7 A: Acute Respiratory Failure - suspect this was largely anatomical in setting of sedation and neck pain. His epiglottis and cords were normal on direct laryngoscopy 10/7 PNA - LLL consolidation vs. atelectasis on CXR - improved High aspiration risk (hx CVA, torticollis, GERD)  P:   Off vent - continue oxygen by [place] D/C empiric abx and steroids Monitor CXR  CARDIOVASCULAR CVL n/a PIV x3 10/5>>> A:  Afib, nonvalvular - chronic, rate controlled on daily dilt; on eliquis Hx CHF (EF 60-65% 09/2013) Hx PVD P:  Continue diltiazem per tube > attempt transition to PO Restart eliquis Restart home diuretic  RENAL A:   Chronic renal insufficiency - below baseline Cr (1.06-1.14) on admission P:   Monitor I/O - UOP good (0.5 cc/kg/hr) Correct electrolytes as needed Monitor BMET with restart of lasix magnesium and KCl KVO  GI/GU NGT 10/5>>>changed 10/7>>> Foley 10/5>>> A:   GERD Diverticulosis Hx prostate cancer BPH P:  Advance diet pending SLP evaluation  SUP: PPI Continue home doxazosin and finasteride per tube or PO  HEMATOLOGIC A:  Anemia,  mild - at baseline, stable Thrombocytopenia - admission platelets 121, baseline wnl P:  Monitor CBC: plt's stable, hgb stable  SCDs  INFECTIOUS A:   Left Infiltrate vs. atelectasis - concern for aspiration PNA Laryngeal edema, r/o epiglottitis - resolved P:   BCx2  10/5 >> UC 10/5 >> Sputum 10/5 >> RVP 10/5 >>  Abx: Unasyn 10/5 >>>10/7; no evidence of epiglottis  ENDOCRINE A:   Mild hyperglycemia  P:   Monitor glucose on BMP Consider SSI with steroid-induced hyperglycemia   NEUROLOGIC A:  Hx CVA  Torticollis P:   RASS at goal: 0 SLP eval, likely PT/OT tomorrow   TODAY'S SUMMARY: Stable s/p direct laryngoscopy showing no epiglottitis. Extubated. D/C NGT, swallow evaluation. Transfer to floor, remaining on CCM service.                                  Eduardo B. Bonner Puna, MD, PGY-2 04/01/2014 7:38 AM   Eduardo Apo, MD, PhD 04/01/2014, 12:06  PM [facility] [phone] or if no answer [phone]

## 2014-04-01 NOTE — Care Management Note (Addendum)
    Page 1 of 1   04/12/2014     3:45:57 PM CARE MANAGEMENT NOTE 04/12/2014  Patient:  Eduardo Deleon, Eduardo Deleon   Account Number:  [id number]  Date Initiated:  04/01/2014  Documentation initiated by:  Elissa Hefty  Subjective/Objective Assessment:   adm w resp failure     Action/Plan:   Anticipated DC Date:  04/13/2014   Anticipated DC Plan:  SKILLED NURSING FACILITY  In-house referral  Clinical Social Worker      DC Planning Services  CM consult      Choice offered to / List presented to:             Status of service:  In process, will continue to follow Medicare Important Message given?  YES (If response is "NO", the following Medicare IM given date fields will be blank) Date Medicare IM given:  04/01/2014 Medicare IM given by:  Elissa Hefty Date Additional Medicare IM given:  04/05/2014 Additional Medicare IM given by:  Marisa Hufstetler  Discharge Disposition:    Per UR Regulation:  Reviewed for med. necessity/level of care/duration of stay  If discussed at [place] of Stay Meetings, dates discussed:   04/06/2014    Comments:  04/12/14 Eduardo Deleon Eduardo Deleon, Eduardo Deleon, Eduardo Deleon, [employer] [phone]. Additional Medicare Important Message given. Pt now agreeable to SNF placement.  04/09/14 Eduardo Deleon Eduardo Deleon, [place], [place], [employer] [phone]. Additional Medicare Important Message given.  04/06/14 Eduardo Mandril, RN, BSN, NCM [phone] Pt refusing SNF or HHPT.  Failed FEES so unable to take PO's.  Will start heparin gtt in place of Eliquis. May need to get palliative care involved.  Pt is confused therefore can not have discussion regarding PEG today. Consider attempting this in AM 10/14.  MD will also attempt to involve family in discussions - he has a Publishing rights manager.

## 2014-04-02 DIAGNOSIS — M436 Torticollis: Secondary | ICD-10-CM

## 2014-04-02 LAB — RESPIRATORY VIRUS PANEL
Adenovirus: NOT DETECTED
INFLUENZA B 1: NOT DETECTED
Influenza A H1: NOT DETECTED
Influenza A H3: NOT DETECTED
Influenza A: NOT DETECTED
METAPNEUMOVIRUS: NOT DETECTED
PARAINFLUENZA 2 A: NOT DETECTED
PARAINFLUENZA 3 A: NOT DETECTED
Parainfluenza 1: NOT DETECTED
RESPIRATORY SYNCYTIAL VIRUS A: NOT DETECTED
RHINOVIRUS: NOT DETECTED
Respiratory Syncytial Virus B: NOT DETECTED

## 2014-04-02 MED ORDER — METOPROLOL TARTRATE 1 MG/ML IV SOLN: 2.5000 mg | Freq: Once | INTRAVENOUS | Status: DC

## 2014-04-02 NOTE — Progress Notes (Signed)
Spoke with physician r/t pt BP 132/109, HR sustaining in 130s; He stated that pt needs to be placed back on cardiac and BP medications and to relay this info to rounding team.

## 2014-04-02 NOTE — Progress Notes (Signed)
PT Cancellation Note  Patient Details Name: Eduardo Deleon MRN: [id number] DOB: Apr 28, 1925   Cancelled Treatment:    Reason Eval/Treat Not Completed: Medical issues which prohibited therapy (High HR). Will return later this AM.   [facility] 04/02/2014, 9:43 AM  [place]

## 2014-04-02 NOTE — Progress Notes (Signed)
Respiratory Virus Panel results are negative; per Infection Control, may remove pt from droplet precaution.

## 2014-04-02 NOTE — Progress Notes (Signed)
PULMONARY / CRITICAL CARE MEDICINE   Name: Eduardo Deleon MRN: [id number] DOB: 1925-04-13    ADMISSION DATE:  03/29/2014 CONSULTATION DATE:  03/29/14  REFERRING MD :  ED physician  CHIEF COMPLAINT:  Neck pain  INITIAL PRESENTATION: 80 yo M with PMH of CVA, Afib, CHF, IBS, GERD, and torticollis presented to ED 10/5 via EMS with neck pain that radiates to his jaw, emergently intubated by anesthesia due to respiratory failure following administration of pain medication.    STUDIES:  10/5  CT head and neck - B pleural effusions, R>L, severe torticollis, no mention of abscess, difficult exam due to anatomy 10/5  CXR L medial basilar consolidation vs. atelectasis. Pulmonary edema. 10/6  CXR LLL atelectasis and/or infiltrate. Pulmonary edema clearing. 10/7  CXR  Bibasilar subsegmental atelectasis. No pulmonary edema. Stable cardiomegaly.  10/8  Swallow eval - D1 diet, pudding thick liquids  SIGNIFICANT EVENTS: 10/5  Presented to ED with neck pain radiating to jaw.  Medicated for pain, then had progressive distress.  Intubated in ER by Anesthesia.  10/7  Extubated in OR with ENT: no laryngeal edema noted. 10/8  To floor, swallow eval   SUBJECTIVE: Pt reports ongoing wet cough.  Denies fevers, chills.  HR up to 130's (no distress)  VITAL SIGNS: Temp:  [98 F (36.7 C)-98.6 F (37 C)] 98 F (36.7 C) (10/09 0507) Pulse Rate:  [89-146] 146 (10/09 0507) Resp:  [18-30] 18 (10/09 0507) BP: (110-147)/(65-109) 132/109 mmHg (10/09 0901) SpO2:  [96 %-100 %] 97 % (10/09 0507) Weight:  [140 lb 9.6 oz (63.776 kg)-150 lb 4.8 oz (68.176 kg)] 140 lb 9.6 oz (63.776 kg) (10/09 0507)  INTAKE / OUTPUT:  Intake/Output Summary (Last 24 hours) at 04/02/14 0915 Last data filed at 04/02/14 0547  Gross per 24 hour  Intake    100 ml  Output   1726 ml  Net  -1626 ml   PHYSICAL EXAMINATION: General:  Frail elderly man in no distress Neuro:  RASS 0; alert, slow appropriate speech. HEENT:  Severe  torticollis with clockwise rotation and leftward deviation Cardiovascular: irregulary irregular, HR 130s, no murmur or JVD.  Lungs: resp's even/non-labored, lungs bilaterally clear, wet voice & cough Abdomen:  Soft, non-tender, non-distended Musculoskeletal:  No edema Skin:  Warm, dry, petechiae noted B LE's  LABS:  CBC  Recent Labs Lab 03/30/14 0240 03/31/14 0219 04/01/14 1020  WBC 4.4 2.8* 6.0  HGB 11.2* 10.9* 11.6*  HCT 33.6* 32.2* 34.3*  PLT 110* 119* 140*   Coag's  Recent Labs Lab 03/29/14 1423  INR 1.46   BMET  Recent Labs Lab 03/30/14 0240 03/31/14 0219 04/01/14 1020  NA 137 136* 137  K 4.2 4.0 3.9  CL 103 101 105  CO2 22 21 21   BUN 15 18 20   CREATININE 0.96 0.98 0.98  GLUCOSE 97 140* 111*   Glucose  Recent Labs Lab 03/31/14 1550 03/31/14 1925 04/01/14 0036 04/01/14 0345 04/01/14 0744 04/01/14 1258  GLUCAP 163* 187* 140* 133* 121* 109*    Imaging Dg Abd Portable 1v  04/01/2014   CLINICAL DATA:  Nasogastric tube placement  EXAM: PORTABLE ABDOMEN - 1 VIEW  COMPARISON:  Portable exam 0441 hr compared to 03/31/2014  FINDINGS: Tip of nasogastric tube projects over proximal to mid stomach little changed.  Stool throughout colon.  Distended bowel loops in the upper central abdomen.  No definite bowel wall thickening.  Atelectasis versus consolidation in LEFT lower lobe remains.  Osseous demineralization with scoliosis and degenerative  disc/facet disease changes of the thoracolumbar spine.  IMPRESSION: No significant interval change.   Electronically Signed   By: Lavonia Dana M.D.   On: 04/01/2014 07:26   Dg Abd Portable 1v  04/01/2014   CLINICAL DATA:  Repositioning of nasogastric tube.  EXAM: PORTABLE ABDOMEN - 1 VIEW  COMPARISON:  Abdominal radiograph performed earlier today at 4:41 a.m.  FINDINGS: The patient's enteric tube is again noted overlying the body of the stomach, with the side port overlying the distal esophagus. The visualized bowel gas  pattern is grossly unremarkable, with air filled small and large bowel loops seen. No free intra-abdominal air is identified, though evaluation for free air is limited on a single supine view.  Mild left basilar atelectasis is noted. The lung bases are otherwise grossly clear. The cardiomediastinal silhouette is enlarged. Mild degenerative change is noted at the lower lumbar spine. No acute osseous abnormalities are seen.  IMPRESSION: 1. Enteric tube noted ending overlying the body of stomach, in unchanged position, with the side port overlying the distal esophagus. 2. Mild left basilar atelectasis noted.  Mild cardiomegaly.   Electronically Signed   By: Garald Balding M.D.   On: 04/01/2014 06:49    ASSESSMENT / PLAN:  PULMONARY OETT 7 10/5>>>10/7 A: Acute Respiratory Failure - suspect this was largely anatomical in setting of sedation and neck pain. His epiglottis and cords were normal on direct laryngoscopy 10/7 PNA - LLL consolidation vs. atelectasis on CXR - improved High aspiration risk (hx CVA, torticollis, GERD)  P:   Wean oxygen to off for sats > 92% Monitor CXR Pulmonary hygiene:  IS, mobilize as able  CARDIOVASCULAR A:  Afib, nonvalvular - chronic, rate controlled on daily cardizem & eliquis Hx CHF (EF 60-65% 09/2013) Hx PVD P:  Cardizem 180 mg QD Continue eliquis, ? If this should be reviewed with Cardiology regarding safety of anticoagulation given advanced age & fall risk Home dose lasix 20 mg QD One time dose lopressor IV 2.5 mg   RENAL A:   Chronic renal insufficiency - below baseline Cr (1.06-1.14) on admission P:   Correct electrolytes as needed Monitor BMET with restart of lasix KVO  GI/GU Foley 10/5>>> A:   GERD Diverticulosis Hx prostate cancer BPH Dysphagia P:  Diet per SLP recommendations SUP: PPI Continue home doxazosin and finasteride   HEMATOLOGIC A:  Anemia, mild - at baseline, stable Thrombocytopenia - admission platelets 121, baseline  wnl P:  Monitor CBC: plt's stable, hgb stable  SCDs  INFECTIOUS A:   Left Infiltrate vs. atelectasis - concern for aspiration PNA Laryngeal edema, r/o epiglottitis - resolved P:   BCx2  10/5 >> RVP 10/5 >>  Abx: Unasyn 10/5 >>>10/7; no evidence of epiglottis  ENDOCRINE A:   Mild hyperglycemia  P:   Monitor glucose on BMP Consider SSI with steroid-induced hyperglycemia   NEUROLOGIC A:  Hx CVA  Torticollis P:   SLP eval Continue PT efforts, likely he will want to return home.  No SNF.    TODAY'S SUMMARY: No epiglottitis on laryngeal exam.  Extubated and tx'd to floor.  SLP eval with concerns for dysphagia - D1 diet recommended.  Continue PT & SLP efforts.                                    Noe Gens, NP-C [facility] Pgr: [phone] or [phone]   04/02/2014, 9:15 AM

## 2014-04-02 NOTE — Progress Notes (Signed)
SLP Cancellation Note  Patient Details Name: DANA DORNER MRN: [id number] DOB: 1925/02/28   Cancelled treatment:        Planned to proceed with FEES today for objective study of swallow function, however, will defer secondary to increased heart rate and blood pressure.  Continue current plan of Dys 1 with pudding thick liquids as tolerated.  If clinical s/s of aspiration with po's, please change to NPO.  SLP will f/u 10/10.   Quinn Axe T 04/02/2014, 10:24 AM

## 2014-04-02 NOTE — Evaluation (Signed)
Physical Therapy Evaluation Patient Details Name: Eduardo Deleon MRN: [id number] DOB: 1924-09-21 Today's Date: 04/02/2014   History of Present Illness  80 yo M with PMH of CVA, Afib, CHF, IBS, GERD, and torticollis presented to ED 10/5 via EMS with neck pain that radiates to his jaw, emergently intubated by anesthesia due to respiratory failure following administration of pain medication. 10/7  Extubated in OR with ENT.  Clinical Impression  Pt admitted with above. Pt currently with functional limitations due to the deficits listed below (see PT Problem List).  Pt will benefit from skilled PT to increase their independence and safety with mobility to allow discharge to the venue listed below. Currently pt unable to stand and amb without physical assistance. Feel pt currently needs SNF, however pt refusing SNF or HH as he has in the past.      Follow Up Recommendations SNF (pt refusing as he has in the past)    Equipment Recommendations  None recommended by PT    Recommendations for Other Services       Precautions / Restrictions Precautions Precautions: Fall      Mobility  Bed Mobility Overal bed mobility: Needs Assistance Bed Mobility: Supine to Sit     Supine to sit: Mod assist     General bed mobility comments: Assist to bring trunk up and hips to EOB.  Transfers Overall transfer level: Needs assistance Equipment used: 1 person hand held assist Transfers: Sit to/from Stand Sit to Stand: Mod assist         General transfer comment: Assist to bring hips up  Ambulation/Gait Ambulation/Gait assistance: Min assist Ambulation Distance (Feet): 10 Feet Assistive device: 1 person hand held assist (Pt grabbing furniture with other hand.) Gait Pattern/deviations: Step-through pattern;Decreased step length - right;Decreased step length - left;Trunk flexed;Shuffle Gait velocity: decr Gait velocity interpretation: Below normal speed for age/gender General Gait Details:  Assist for balance  Stairs            Wheelchair Mobility    Modified Rankin (Stroke Patients Only)       Balance Overall balance assessment: Needs assistance Sitting-balance support: Bilateral upper extremity supported Sitting balance-Leahy Scale: Poor Sitting balance - Comments: Required assist of UE's for static sitting.   Standing balance support: Single extremity supported Standing balance-Leahy Scale: Poor                               Pertinent Vitals/Pain Pain Assessment: Faces Faces Pain Scale: No hurt    Home Living Family/patient expects to be discharged to:: Private residence Living Arrangements: Alone Available Help at Discharge: Personal care attendant Type of Home: House Home Access: Stairs to enter Entrance Stairs-Rails: None Entrance Stairs-Number of Steps: 2 Home Layout: One level Home Equipment: Walker - 4 wheels;Cane - single point;Bedside commode Additional Comments: Pt sleeps in recliner.    Prior Function Level of Independence: Independent with assistive device(s)         Comments: Pt used furntiure to stabilize in home and RW outside     Hand Dominance   Dominant Hand: Right    Extremity/Trunk Assessment   Upper Extremity Assessment: Defer to OT evaluation           Lower Extremity Assessment: Generalized weakness         Communication   Communication: HOH  Cognition Arousal/Alertness: Awake/alert Behavior During Therapy: WFL for tasks assessed/performed Overall Cognitive Status: Difficult to assess  General Comments      Exercises        Assessment/Plan    PT Assessment Patient needs continued PT services  PT Diagnosis Difficulty walking;Generalized weakness   PT Problem List Decreased strength;Decreased activity tolerance;Decreased balance;Decreased mobility  PT Treatment Interventions DME instruction;Gait training;Functional mobility training;Therapeutic  activities;Therapeutic exercise;Balance training;Patient/family education   PT Goals (Current goals can be found in the Care Plan section) Acute Rehab PT Goals Patient Stated Goal: Return home PT Goal Formulation: With patient Time For Goal Achievement: 04/09/14 Potential to Achieve Goals: Good    Frequency Min 3X/week   Barriers to discharge Decreased caregiver support      Co-evaluation               End of Session Equipment Utilized During Treatment: Gait belt Activity Tolerance: Patient limited by fatigue Patient left: in chair;with call bell/phone within reach Nurse Communication: Mobility status         Time: 3664-4034 PT Time Calculation (min): 23 min   Charges:   PT Evaluation $Initial PT Evaluation Tier I: 1 Procedure PT Treatments $Gait Training: 8-22 mins   PT G Codes:          Eduardo Deleon April 19, 2014, 2:08 PM  [facility] PT [phone]

## 2014-04-03 DIAGNOSIS — M436 Torticollis: Secondary | ICD-10-CM

## 2014-04-03 LAB — BASIC METABOLIC PANEL
Anion gap: 13 (ref 5–15)
BUN: 20 mg/dL (ref 6–23)
CALCIUM: 8.1 mg/dL — AB (ref 8.4–10.5)
CO2: 24 mEq/L (ref 19–32)
Chloride: 102 mEq/L (ref 96–112)
Creatinine, Ser: 0.92 mg/dL (ref 0.50–1.35)
GFR calc Af Amer: 85 mL/min — ABNORMAL LOW (ref >90)
GFR calc non Af Amer: 74 mL/min — ABNORMAL LOW (ref >90)
GLUCOSE: 103 mg/dL — AB (ref 70–99)
Potassium: 3.8 mEq/L (ref 3.7–5.3)
Sodium: 139 mEq/L (ref 137–147)

## 2014-04-03 LAB — CBC
HEMATOCRIT: 35.3 % — AB (ref 39.0–52.0)
Hemoglobin: 11.8 g/dL — ABNORMAL LOW (ref 13.0–17.0)
MCH: 29.3 pg (ref 26.0–34.0)
MCHC: 33.4 g/dL (ref 30.0–36.0)
MCV: 87.6 fL (ref 78.0–100.0)
PLATELETS: 121 10*3/uL — AB (ref 150–400)
RBC: 4.03 MIL/uL — ABNORMAL LOW (ref 4.22–5.81)
RDW: 14.1 % (ref 11.5–15.5)
WBC: 4.5 10*3/uL (ref 4.0–10.5)

## 2014-04-03 NOTE — Progress Notes (Signed)
Occupational Therapy Evaluation Patient Details Name: SHAWNE ESKELSON MRN: [id number] DOB: [birth year] Today's Date: 04/03/2014    History of Present Illness 80 yo M with PMH of CVA, Afib, CHF, IBS, GERD, and torticollis presented to ED 10/5 via EMS with neck pain that radiates to his jaw, emergently intubated by anesthesia due to respiratory failure following administration of pain medication. 10/7  Extubated in OR with ENT.   Clinical Impression   Patient with decreased ADL independence and safety. Will benefit from skilled OT to maximize independence.    Follow Up Recommendations  SNF    Equipment Recommendations       Recommendations for Other Services       Precautions / Restrictions Precautions Precautions: Fall Restrictions Weight Bearing Restrictions: No      Mobility Bed Mobility                  Transfers                      Balance                                            ADL Overall ADL's : Needs assistance/impaired Eating/Feeding: Set up   Grooming: Wash/dry hands;Wash/dry face;Set up;Sitting                                 General ADL Comments: Patient up in recliner. Severe torticollis. Very congested sounding. Hard of hearing. Difficult to obtain PLOF/home environment. Personal care attendant assists with bathing, laundry, housework, meals. Patient declined mobility on eval, states confortable in chair.     Vision                     Perception     Praxis      Pertinent Vitals/Pain Pain Assessment: No/denies pain     Hand Dominance Right   Extremity/Trunk Assessment Upper Extremity Assessment Upper Extremity Assessment: Generalized weakness   Lower Extremity Assessment Lower Extremity Assessment: Defer to PT evaluation       Communication Communication Communication: HOH   Cognition Arousal/Alertness: Awake/alert Behavior During Therapy: WFL for tasks  assessed/performed Overall Cognitive Status: Difficult to assess                     General Comments       Exercises       Shoulder Instructions      Home Living Family/patient expects to be discharged to:: Private residence Living Arrangements: Alone Available Help at Discharge: Personal care attendant Type of Home: House Home Access: Stairs to enter [employer] of Steps: 2 Entrance Stairs-Rails: None Home Layout: One level               Home Equipment: Walker - 4 wheels;Cane - single point;Bedside commode   Additional Comments: Pt sleeps in recliner.      Prior Functioning/Environment Level of Independence: Independent with assistive device(s)        Comments: Pt used furntiure to stabilize in home and RW outside    OT Diagnosis: Generalized weakness   OT Problem List: Decreased strength;Decreased activity tolerance;Impaired balance (sitting and/or standing);Decreased safety awareness;Decreased knowledge of use of DME or AE;Cardiopulmonary status limiting activity   OT Treatment/Interventions: Self-care/ADL training;Therapeutic exercise;Energy conservation;DME and/or AE instruction;Therapeutic  activities;Patient/family education    OT Goals(Current goals can be found in the care plan section) Acute Rehab OT Goals Patient Stated Goal: Return home OT Goal Formulation: With patient Time For Goal Achievement: 04/17/14 Potential to Achieve Goals: Good  OT Frequency: Min 2X/week   Barriers to D/C: Decreased caregiver support          Co-evaluation              End of Session    Activity Tolerance: Patient tolerated treatment well Patient left: in chair;with call bell/phone within reach   Time: 1302-1317 OT Time Calculation (min): 15 min Charges:  OT General Charges $OT Visit: 1 Procedure OT Evaluation $Initial OT Evaluation Tier I: 1 Procedure OT Treatments $Self Care/Home Management : 8-22 mins G-Codes:    Eytan Carrigan  A 2014/04/17, 1:26 PM

## 2014-04-03 NOTE — Progress Notes (Signed)
RT was called to room by RN to NTS patient. When Rt listened to pt, he sounded very rhonchus. RT and charge RT NTS pt without any problems or complications. RT suctioned a copius amount of thin frothy clear and white secretions. After Pt sounded much better afterward. RT will continue to monitor.

## 2014-04-03 NOTE — Progress Notes (Signed)
PULMONARY / CRITICAL CARE MEDICINE   Name: Eduardo Deleon MRN: [id number] DOB: [birth year]    ADMISSION DATE:  03/29/2014 CONSULTATION DATE:  03/29/14  REFERRING MD :  ED physician  CHIEF COMPLAINT:  Neck pain  INITIAL PRESENTATION: 80 yo M with PMH of CVA, Afib, CHF, IBS, GERD, and torticollis presented to ED 10/5 via EMS with neck pain that radiates to his jaw, emergently intubated by anesthesia due to respiratory failure following administration of pain medication.    STUDIES:  10/5  CT head and neck - B pleural effusions, R>L, severe torticollis, no mention of abscess, difficult exam due to anatomy 10/5  CXR L medial basilar consolidation vs. atelectasis. Pulmonary edema. 10/6  CXR LLL atelectasis and/or infiltrate. Pulmonary edema clearing. 10/7  CXR  Bibasilar subsegmental atelectasis. No pulmonary edema. Stable cardiomegaly.  10/8  Swallow eval - D1 diet, pudding thick liquids 10/10 SLP eval.  Rec NPO for now.  Plan to f/u with further puree trials.  Likely will need FEES  SIGNIFICANT EVENTS: 10/5  Presented to ED with neck pain radiating to jaw.  Medicated for pain, then had progressive distress.  Intubated in ER by Anesthesia.  10/7  Extubated in OR with ENT: no laryngeal edema noted. 10/8  To floor, swallow eval  10/10 PT and OT eval.  Both rec SNF for rehab  SUBJECTIVE: Still with wet cough but improving.  Denies pain.  HR in 90's at time of my exam.    VITAL SIGNS: Temp:  [97.2 F (36.2 C)-98.5 F (36.9 C)] 98.5 F (36.9 C) (10/10 1958) Pulse Rate:  [96-101] 100 (10/10 1958) Resp:  [20-22] 20 (10/10 1958) BP: (111-122)/(65-85) 113/69 mmHg (10/10 1958) SpO2:  [90 %-98 %] 96 % (10/10 1958) Weight:  [131 lb (59.421 kg)] 131 lb (59.421 kg) (10/10 0431)  INTAKE / OUTPUT:  Intake/Output Summary (Last 24 hours) at 04/03/14 2257 Last data filed at 04/03/14 2002  Gross per 24 hour  Intake      0 ml  Output   1375 ml  Net  -1375 ml   PHYSICAL EXAMINATION: General:   Frail elderly man in no distress Neuro:  Awake, alert, NAD HEENT:  Severe torticollis with clockwise rotation and leftward deviation Cardiovascular: irregulary irregular, HR 90's, no murmur or JVD.  Lungs: resp's even/non-labored, lungs bilaterally clear, wet voice & cough Abdomen:  Soft, non-tender, non-distended Musculoskeletal:  No edema Skin:  Warm, dry, petechiae noted B LE's  LABS:  CBC  Recent Labs Lab 03/31/14 0219 04/01/14 1020 04/03/14 0336  WBC 2.8* 6.0 4.5  HGB 10.9* 11.6* 11.8*  HCT 32.2* 34.3* 35.3*  PLT 119* 140* 121*   Coag's  Recent Labs Lab 03/29/14 1423  INR 1.46   BMET  Recent Labs Lab 03/31/14 0219 04/01/14 1020 04/03/14 0336  NA 136* 137 139  K 4.0 3.9 3.8  CL 101 105 102  CO2 21 21 24   BUN 18 20 20   CREATININE 0.98 0.98 0.92  GLUCOSE 140* 111* 103*   Glucose  Recent Labs Lab 03/31/14 1550 03/31/14 1925 04/01/14 0036 04/01/14 0345 04/01/14 0744 04/01/14 1258  GLUCAP 163* 187* 140* 133* 121* 109*    Imaging No results found.  ASSESSMENT / PLAN:  PULMONARY OETT 7 10/5>>>10/7 A: Acute Respiratory Failure - suspect this was largely anatomical in setting of sedation and neck pain. His epiglottis and cords were normal on direct laryngoscopy 10/7 PNA - LLL consolidation vs. atelectasis on CXR - improved High aspiration risk (hx CVA,  torticollis, GERD)  P:   Wean oxygen to off for sats > 92% Pulmonary hygiene:  IS, mobilize  CARDIOVASCULAR A:  Afib, nonvalvular - chronic, rate controlled on daily cardizem & eliquis Hx CHF (EF 60-65% 09/2013) Hx PVD P:  Cardizem 180 mg QD, currently on hold today 2/2 new NPO status.  Can use IV prn.  To get repeat eval by speech so hopefully can resume po soon Continue eliquis,  Home dose lasix 20 mg QD   RENAL A:   Chronic renal insufficiency - below baseline Cr (1.06-1.14) on admission P:   Correct electrolytes as needed Monitor BMET KVO  GI/GU Foley 10/5>>> A:    GERD Diverticulosis Hx prostate cancer BPH Dysphagia P:  Diet per SLP recommendations SUP: PPI Continue home doxazosin and finasteride  Likely can trial foley removal and voiding trials in AM  HEMATOLOGIC A:  Anemia, mild - at baseline, stable Thrombocytopenia - admission platelets 121, baseline wnl P:  Monitor CBC: plt's stable, hgb stable  SCDs  INFECTIOUS A:   Left Infiltrate vs. atelectasis - concern for aspiration PNA Laryngeal edema, r/o epiglottitis - resolved P:   BCx2  10/5 >> NGTD RVP 10/5 >> Neg Abx: Unasyn 10/5 >>>10/7; no evidence of epiglottis  ENDOCRINE A:   Mild hyperglycemia  P:   Monitor glucose on BMP   NEUROLOGIC A:  Hx CVA  Torticollis P:   SLP eval today for repeat eval.  Hopefully can resume diet in next 24-48hrs Continue PT/OT efforts.  Both rec SNF for now.  Pt reluctant.      TODAY'S SUMMARY: Pt improving.  Cough better.  NPO for today per SLT.  Continue PT & SLP efforts.                                    Lucrezia Starch, MD [facility] Pgr:  [phone]   04/03/2014, 10:57 PM

## 2014-04-03 NOTE — Progress Notes (Signed)
Speech Language Pathology Treatment: Dysphagia  Patient Details Name: Eduardo Deleon MRN: [id number] DOB: 03/16/1925 Today's Date: 04/03/2014 Time: 1000-1035 SLP Time Calculation (min): 35 min  Assessment / Plan / Recommendation Clinical Impression  Pt seen at bedside for dysphagia treatment. Per chart review note that patient has new onset of congested cough with RT providing NTS. Per RN, pt with difficulty at breakfast characterized by coughing episodes, with NT stopping the meal due to concerns about tolerance. Upon SLP arrival, pt's voice was notably wet, requiring cues for expectoration and suctioning. SLP provided trials of Dys 1 textures at bedside with Mod cues for small bites (1/2 to 1/4 tsp at a time) and effortful swallows to increase safety. Despite attempts at various compensatory strategies, pt continues to cough, with productive coughs yielding applesauce from his pharynx and/or larynx. After a few trials, pt reported that he was too fatigued to continue. Recommend NPO status for today, with SLP f/u at bedside for further puree trials to determine readiness for objective testing. Will likely need FEES given positioning from torticollis.    HPI HPI: 80 yo M with PMH of CVA (1978), Afib, CHF, IBS, GERD, and torticollis presented to ED 10/5 via EMS with neck pain that radiates to his jaw, emergently intubated by anesthesia due to respiratory failure following administration of pain medication. OETT 7 10/5>>>10/7.  Extubated per ENT under direct laryngoscopy.  Noted with severe torticollis. Basically normal larynx with bilateral mobile vocal cords.  Continues with NG today.   Pt describes hx of "trouble swallowing" that worsens when he becomes ill.    Pertinent Vitals Pain Assessment: No/denies pain  SLP Plan  Goals updated    Recommendations Diet recommendations: NPO Medication Administration: Via alternative means              Oral Care Recommendations: Oral care BID Follow up  Recommendations:  (TBD) Plan: Goals updated    GO      Germain Osgood, M.A. CCC-SLP [phone]  Germain Osgood 04/03/2014, 10:45 AM

## 2014-04-03 NOTE — Progress Notes (Addendum)
Physical Therapy Treatment Patient Details Name: Eduardo Deleon MRN: [id number] DOB: [birth year] Today's Date: 04/03/2014    History of Present Illness 80 yo M with PMH of CVA, Afib, CHF, IBS, GERD, and torticollis presented to ED 10/5 via EMS with neck pain that radiates to his jaw, emergently intubated by anesthesia due to respiratory failure following administration of pain medication. 10/7  Extubated in OR with ENT.    PT Comments    Pt progressing towards physical therapy goals. Was able to improve ambulation distance with RW for support. Increased time and mod assist still required for transfers. Even with aide coming to assist him at home (not 24 hours), continue to recommend SNF at d/c so pt can have appropriate 24 hour assistance with mobility while strengthening and progressing with transfer training/ambulation.  Follow Up Recommendations  SNF (Pt refusing SNF or HHPT)     Equipment Recommendations  None recommended by PT    Recommendations for Other Services       Precautions / Restrictions Precautions Precautions: Fall Restrictions Weight Bearing Restrictions: No    Mobility  Bed Mobility Overal bed mobility: Needs Assistance Bed Mobility: Supine to Sit     Supine to sit: Mod assist     General bed mobility comments: Assist to bring trunk up and hips to EOB. Increased time required.  Transfers Overall transfer level: Needs assistance Equipment used: Rolling walker (2 wheeled) Transfers: Sit to/from Stand Sit to Stand: Mod assist         General transfer comment: Assist to power-up to full standing position. Increased time required.   Ambulation/Gait Ambulation/Gait assistance: Min assist Ambulation Distance (Feet): 70 Feet Assistive device: Rolling walker (2 wheeled) Gait Pattern/deviations: Decreased stride length;Shuffle;Trunk flexed;Narrow base of support;Drifts right/left Gait velocity: Decreased Gait velocity interpretation: Below normal  speed for age/gender General Gait Details: Assist for walker direction and positioning as pt drifting to the right. O2 sats dropped to 86% on RA.   Stairs            Wheelchair Mobility    Modified Rankin (Stroke Patients Only)       Balance Overall balance assessment: Needs assistance Sitting-balance support: Feet supported;No upper extremity supported Sitting balance-Leahy Scale: Poor Sitting balance - Comments: Required assist of UE's for static sitting.   Standing balance support: Bilateral upper extremity supported Standing balance-Leahy Scale: Poor Standing balance comment: Pt requires UE assist to maintain static standing.                     Cognition Arousal/Alertness: Awake/alert Behavior During Therapy: WFL for tasks assessed/performed Overall Cognitive Status: Difficult to assess                      Exercises      General Comments        Pertinent Vitals/Pain Pain Assessment: No/denies pain    Home Living Family/patient expects to be discharged to:: Private residence Living Arrangements: Alone Available Help at Discharge: Personal care attendant Type of Home: House Home Access: Stairs to enter Entrance Stairs-Rails: None Home Layout: One level Home Equipment: Environmental consultant - 4 wheels;Cane - single point;Bedside commode Additional Comments: Pt sleeps in recliner.    Prior Function Level of Independence: Independent with assistive device(s)      Comments: Pt used furntiure to stabilize in home and RW outside   PT Goals (current goals can now be found in the care plan section) Acute Rehab PT Goals Patient Stated Goal:  Return home PT Goal Formulation: With patient Time For Goal Achievement: 04/09/14 Potential to Achieve Goals: Good Progress towards PT goals: Progressing toward goals    Frequency  Min 3X/week    PT Plan Current plan remains appropriate    Co-evaluation             End of Session Equipment Utilized  During Treatment: Gait belt;Oxygen Activity Tolerance: Patient limited by fatigue Patient left: in chair;with call bell/phone within reach;with chair alarm set     Time: 1135-1206 PT Time Calculation (min): 31 min  Charges:  $Gait Training: 8-22 mins $Therapeutic Activity: 8-22 mins                    G Codes:      Rolinda Roan 27-Apr-2014, 3:26 PM  Rolinda Roan, PT, DPT Acute Rehabilitation Services Pager: [phone]

## 2014-04-04 DIAGNOSIS — D649 Anemia, unspecified: Secondary | ICD-10-CM

## 2014-04-04 DIAGNOSIS — I481 Persistent atrial fibrillation: Secondary | ICD-10-CM

## 2014-04-04 LAB — CULTURE, BLOOD (ROUTINE X 2)
CULTURE: NO GROWTH
Culture: NO GROWTH

## 2014-04-04 NOTE — Progress Notes (Signed)
PULMONARY / CRITICAL CARE MEDICINE   Name: Eduardo Deleon MRN: [id number] DOB: 1925/04/07    ADMISSION DATE:  03/29/2014 CONSULTATION DATE:  03/29/14  REFERRING MD :  ED physician  CHIEF COMPLAINT:  Neck pain  INITIAL PRESENTATION: 80 yo M with PMH of CVA, Afib, CHF, IBS, GERD, and torticollis presented to ED 10/5 via EMS with neck pain that radiates to his jaw, emergently intubated by anesthesia due to respiratory failure following administration of pain medication.    STUDIES:  10/5  CT head and neck - B pleural effusions, R>L, severe torticollis, no mention of abscess, difficult exam due to anatomy 10/5  CXR L medial basilar consolidation vs. atelectasis. Pulmonary edema. 10/6  CXR LLL atelectasis and/or infiltrate. Pulmonary edema clearing. 10/7  CXR  Bibasilar subsegmental atelectasis. No pulmonary edema. Stable cardiomegaly.  10/8  Swallow eval - D1 diet, pudding thick liquids 10/10 SLP eval.  Rec NPO for now.  Plan to f/u with further puree trials.  Likely will need FEES  SIGNIFICANT EVENTS: 10/5  Presented to ED with neck pain radiating to jaw.  Medicated for pain, then had progressive distress.  Intubated in ER by Anesthesia.  10/7  Extubated in OR with ENT: no laryngeal edema noted. 10/8  To floor, swallow eval  10/10 PT and OT eval.  Both rec SNF for rehab  SUBJECTIVE:  nad in chair   VITAL SIGNS: Temp:  [97.6 F (36.4 C)-98.5 F (36.9 C)] 97.6 F (36.4 C) (10/11 0626) Pulse Rate:  [72-100] 72 (10/11 0626) Resp:  [18-20] 18 (10/11 0626) BP: (110-113)/(69-70) 110/70 mmHg (10/11 0626) SpO2:  [96 %] 96 % (10/11 0626) Weight:  [127 lb 6.8 oz (57.8 kg)] 127 lb 6.8 oz (57.8 kg) (10/11 0626)  INTAKE / OUTPUT:  Intake/Output Summary (Last 24 hours) at 04/04/14 1445 Last data filed at 04/04/14 1100  Gross per 24 hour  Intake      0 ml  Output   2150 ml  Net  -2150 ml   PHYSICAL EXAMINATION: General:  Frail elderly man in no distress Neuro:  Awake, alert,  NAD HEENT:  Severe torticollis with clockwise rotation and rightward deviation Cardiovascular: irregulary irregular, HR 90's, no murmur or JVD.  Lungs: resp's even/non-labored, lungs bilaterally clear, wet voice & cough Abdomen:  Soft, non-tender, non-distended Musculoskeletal:  No edema Skin:  Warm, dry, petechiae noted B LE's  LABS:  CBC  Recent Labs Lab 03/31/14 0219 04/01/14 1020 04/03/14 0336  WBC 2.8* 6.0 4.5  HGB 10.9* 11.6* 11.8*  HCT 32.2* 34.3* 35.3*  PLT 119* 140* 121*   Coag's  Recent Labs Lab 03/29/14 1423  INR 1.46   BMET  Recent Labs Lab 03/31/14 0219 04/01/14 1020 04/03/14 0336  NA 136* 137 139  K 4.0 3.9 3.8  CL 101 105 102  CO2 21 21 24   BUN 18 20 20   CREATININE 0.98 0.98 0.92  GLUCOSE 140* 111* 103*   Glucose  Recent Labs Lab 03/31/14 1550 03/31/14 1925 04/01/14 0036 04/01/14 0345 04/01/14 0744 04/01/14 1258  GLUCAP 163* 187* 140* 133* 121* 109*    Imaging No results found.  ASSESSMENT / PLAN:  PULMONARY OETT 7 10/5>>>10/7 A: Acute Respiratory Failure - suspect this was largely anatomical in setting of sedation and neck pain. His epiglottis and cords were normal on direct laryngoscopy 10/7 PNA - LLL consolidation vs. atelectasis on CXR - improved High aspiration risk (hx CVA, torticollis, GERD)  P:   Wean oxygen to off for sats >  92% Pulmonary hygiene:  IS, mobilize  CARDIOVASCULAR A:  Afib, nonvalvular - chronic, rate controlled on daily cardizem & eliquis Hx CHF (EF 60-65% 09/2013) Hx PVD P:  Cardizem 180 mg QD, currently on hold today 2/2 new NPO status.  Can use IV prn.  To get repeat eval by speech so hopefully can resume po soon Continue eliquis,  Home dose lasix 20 mg QD   RENAL A:   Chronic renal insufficiency - below baseline Cr (1.06-1.14) on admission Lab Results  Component Value Date   CREATININE 0.92 04/03/2014   CREATININE 0.98 04/01/2014   CREATININE 0.98 03/31/2014   CREATININE 1.14 03/23/2013     P:  Correct electrolytes as needed Monitor BMET KVO  GI/GU Foley 10/5>>> A:   GERD Diverticulosis Hx prostate cancer BPH Dysphagia P:  Diet per SLP recommendations SUP: PPI Continue home doxazosin and finasteride  Likely can trial foley removal and voiding trials in AM  HEMATOLOGIC A:  Anemia, mild - at baseline, stable Lab Results  Component Value Date   HGB 11.8* 04/03/2014   HGB 11.6* 04/01/2014   HGB 10.9* 03/31/2014    Thrombocytopenia - admission platelets 121, baseline wnl Lab Results  Component Value Date   PLT 121* 04/03/2014   PLT 140* 04/01/2014   PLT 119* 03/31/2014    P:  Monitor CBC: plt's stable, hgb stable  SCDs  INFECTIOUS A:   Left Infiltrate vs. atelectasis - concern for aspiration PNA Laryngeal edema, r/o epiglottitis - resolved BCx2  10/5 >>  RVP 10/5 >> Ne P:    Abx: Unasyn 10/5 >>>10/7; no evidence of epiglottis so holding further abx   ENDOCRINE A:   Mild hyperglycemia  P:   Monitor glucose on BMP   NEUROLOGIC A:  Hx CVA  Torticollis SLP eval 10/9 > D I       P :  May need to readdress swallowing on 10/12 per nursing concerns but no overt asp at present   Christinia Gully, MD [facility] and [place] [phone] After 5:30 PM or weekends, call [phone]

## 2014-04-04 NOTE — Progress Notes (Addendum)
Speech Language Pathology Treatment: Dysphagia  Patient Details Name: Eduardo Deleon MRN: [id number] DOB: [birth year] Today's Date: 04/04/2014 Time: 8413-2440 SLP Time Calculation (min): 18 min  Assessment / Plan / Recommendation Clinical Impression  Dysphagia treatment provided today. Pt at about 60 degree angle in chair during treatment. Prior to POs, pt presented with wet vocal quality and coughed when cued. Pt consumed about 5 bites of applesauce with significantly prolonged oral phase and multiple swallows for small amounts. Delayed cough occurred with 2 of 5 bites. Suction provided- minimal amount returned to oral cavity. RN reports pt overall "much improved" compared to yesterday- alert and carrying on conversation today. Given delayed cough and wet vocal quality, pt continues to be at high risk of aspiration; recommend continuing NPO status with meds via alternative means. Also recommend FEES tomorrow to objectively evaluate swallow function and determine safest diet.    HPI HPI: 80 yo M with PMH of CVA (1978), Afib, CHF, IBS, GERD, and torticollis presented to ED 10/5 via EMS with neck pain that radiates to his jaw, emergently intubated by anesthesia due to respiratory failure following administration of pain medication. OETT 7 10/5>>>10/7.  Extubated per ENT under direct laryngoscopy.  Noted with severe torticollis. Basically normal larynx with bilateral mobile vocal cords.  Continues with NG today.   Pt describes hx of "trouble swallowing" that worsens when he becomes ill.    Pertinent Vitals Pain Assessment: No/denies pain  SLP Plan  Continue with current plan of care    Recommendations Diet recommendations: NPO Medication Administration: Via alternative means              Oral Care Recommendations: Oral care Q4 per protocol Follow up Recommendations: Other (comment) (TBD) Plan: Continue with current plan of care    GO     Kern Reap, [place], CCC-SLP 04/04/2014, 4:24  PM

## 2014-04-04 NOTE — Progress Notes (Signed)
The patient tried to get out of the chair and out of the bed by himself several times last night.  He was reoriented and reeducated on pressing the call bell when he needed to get out of bed by himself.  He was kept strictly NPO during the night and received mouth care.  He was able to cough up thick secretions that were suctioned out of his mouth with the [place].  He is on a camera for safety and his bed alarm was turned on.

## 2014-04-04 NOTE — Progress Notes (Signed)
Pt with much less secretions today. Constantly encouraged to cough up secretions and able to suction from back of throat. Still unable to swallow meds  Without much coughing.Pt up in chair most of day. Somewhat confused early in the am, but completely oriented at present.

## 2014-04-04 NOTE — Progress Notes (Signed)
Pt in room with safety camera, camera on. Discussed with pt and informed him its use for safety.

## 2014-04-05 NOTE — Progress Notes (Signed)
Patient's heart rate went up into 160s.  Patient was standing beside the chair attempting to get something to drink.  Patient was reminded that he was not allowed to eat or drink anything. After sitting back in the chair, heart rate down to low 100s.  Patient placed on chair alarm.  Will continue to monitor.

## 2014-04-05 NOTE — Progress Notes (Signed)
Spoke with Dr Elsworth Soho re po meds of patient who failed FEES  With orders to hold po meds for tonight.

## 2014-04-05 NOTE — Progress Notes (Signed)
Richardson Landry NP made aware of heart rate going up into the 160s-170s while patient is out of bed.  Also, Estill Bamberg from speech therapy stated it was ok to give oral medications.

## 2014-04-05 NOTE — Progress Notes (Signed)
Patient attempted multiple times to get out of bed.  Patient confused at baseline.  MD on call made aware and requested for safety sitter.  RN will continue to monitor. Shellee Milo, RN

## 2014-04-05 NOTE — Progress Notes (Signed)
SPEECH PATHOLOGY  Pt scheduled for FEES at 1:30 today.     Analiese Krupka L. [place], [place] CCC/SLP Pager [phone]'

## 2014-04-05 NOTE — Progress Notes (Signed)
Agitated RN asking for sitter  Plan Order for sitter sent  Dr. Brand Males, M.D., [facility].C.P Pulmonary and Critical Care Medicine Staff Physician [facility] Pager: [phone], If no answer or between  15:00h - 7:00h: call [phone]  04/05/2014 10:10 PM

## 2014-04-05 NOTE — Plan of Care (Signed)
Problem: Phase II Progression Outcomes Goal: Tolerating prescribed nutrition plan Outcome: Not Progressing Patient is aspirating liquids and pudding consistency.  Will maintain NPO status

## 2014-04-05 NOTE — Procedures (Signed)
Objective Swallowing Evaluation: Fiberoptic Endoscopic Evaluation of Swallowing  Patient Details  Name: Eduardo Deleon MRN: [id number] Date of Birth: 10-24-1924  Today's Date: 04/05/2014 Time: 0910-0924; 1330-1430-  SLP Time Calculation (min): 14 min treatment in am; 60 min FEES in pm  Past Medical History:  Past Medical History  Diagnosis Date  . 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 Atrial fibrillation     chronic anticaog - weintraub  . Congestive heart failure   . Anemia   . Osteoarthritis   . Splenic lesion   . Venous stasis dermatitis     hx venous ulcer/cellulitis 06/2011 R and 04/2011 L  . Prostate cancer   . Anal fissure     Hx of  . Internal hemorrhoids   . Diverticulosis 04/12/1998    Left colon--Flex Sig  . CRI (chronic renal insufficiency)   . GERD (gastroesophageal reflux disease)   . PVD (peripheral vascular disease)   . Torticollis, acquired   . Tubular adenoma    Past Surgical History:  Past Surgical History  Procedure Laterality Date  . Joint replacement  bilaterial knees  . Lumbar laminectomy  Per patient  heriated disc in mid spine  . Hernia repair      [phone], right - 1985  . Hemorrhoid surgery    . Tonsillectomy    . Colonoscopy      1988  . Direct laryngoscopy N/A 03/31/2014    Procedure: DIRECT LARYNGOSCOPY/EXAM UNDER ANESTHESIA;  Surgeon: [name], MD;  Location: [facility] OR;  Service: ENT;  Laterality: N/A;   HPI:  80 yo M with PMH of CVA (1978), Afib, CHF, IBS, GERD, and torticollis presented to ED 10/5 via EMS with neck pain that radiates to his jaw, emergently intubated by anesthesia due to respiratory failure following administration of pain medication. OETT 7 10/5>>>10/7.  Extubated per ENT under direct laryngoscopy.  Noted with severe torticollis. Basically normal larynx with bilateral mobile vocal cords.  Continues with NG today.   Pt describes hx of "trouble swallowing" that worsens when he becomes ill.  Pt had clinical swallow evaluation  on 10/8, which revealed s/s worrisome for aspiration.  Dys 1 diet started; NPO over w/e due to increased difficulty.       Assessment / Plan / Recommendation Clinical Impression  Dysphagia Diagnosis: Severe pharyngeal phase dysphagia Clinical impression: Pt presents with severe dysphagia marked by significant pharyngeal residue that spills into larynx during and after the swallow.  Pt swallows multiple times in an effort to clear pharynx; however, material, regardless of consistency, penetrates larynx and is subsequently aspirated through anterior commissure.  Aspiration sometimes elicits a spontaneous cough - at other times, aspiration is silent.  Postural adjustments attempted in an effort to enhance airway protection - they were ineffective.  Notable were bilateral ulcerations on posterior vocal folds.  In addition, pt on one occasion coughed and brought forth pieces of white pill from his trachea.  Discussed results with pt, who was lucid and verbalized understanding, and Dr. Lamonte Sakai.  Dr. Lamonte Sakai will f/u with pt next date to discuss results, implications, and plan.  Will follow for decision. Rec NPO pending their discussion.     Treatment Recommendation   (Await discussion with PCCM)    Diet Recommendation NPO   Medication Administration: Via alternative means    Other  Recommendations Oral Care Recommendations:  (qd)             SLP Swallow Goals     General Date of Onset:  04/05/14 HPI: 80 yo M with PMH of CVA (1978), Afib, CHF, IBS, GERD, and torticollis presented to ED 10/5 via EMS with neck pain that radiates to his jaw, emergently intubated by anesthesia due to respiratory failure following administration of pain medication. OETT 7 10/5>>>10/7.  Extubated per ENT under direct laryngoscopy.  Noted with severe torticollis. Basically normal larynx with bilateral mobile vocal cords.  Continues with NG today.   Pt describes hx of "trouble swallowing" that worsens when he becomes ill.  Pt  had clinical swallow evaluation on 10/8, which revealed s/s worrisome for aspiration.  Dys 1 diet started; NPO over w/e due to increased difficulty.   Type of Study: Fiberoptic Endoscopic Evaluation of Swallowing Reason for Referral: Objectively evaluate swallowing function Previous Swallow Assessment: none per records Diet Prior to this Study: NPO Temperature Spikes Noted: No Respiratory Status: Nasal cannula History of Recent Intubation: Yes Length of Intubations (days): 2 days Date extubated: 03/31/14 Behavior/Cognition: Alert;Cooperative;Pleasant mood Oral Cavity - Dentition: Missing dentition Oral Motor / Sensory Function: Within functional limits (pt with torticollis; head flexed to left) Self-Feeding Abilities: Needs assist Patient Positioning: Partially reclined Baseline Vocal Quality: Hoarse Volitional Cough: Strong Volitional Swallow: Able to elicit Anatomy: Other (Comment) (epiglottis at rest near posterior pharyngeal wall; vocal cords with bilateral ulceration posterior cords) Pharyngeal Secretions: Standing secretions in (comment)    Reason for Referral Objectively evaluate swallowing function   Oral Phase Oral Preparation/Oral Phase Oral Phase: WFL   Pharyngeal Phase Pharyngeal Phase Pharyngeal Phase: Impaired Pharyngeal - Nectar Pharyngeal - Nectar Teaspoon: Premature spillage to pyriform sinuses;Reduced laryngeal elevation;Reduced airway/laryngeal closure;Reduced tongue base retraction;Penetration/Aspiration during swallow;Penetration/Aspiration after swallow;Moderate aspiration;Pharyngeal residue - pyriform sinuses;Inter-arytenoid space residue;Compensatory strategies attempted (Comment) (postural adjustments not effective) Penetration/Aspiration details (nectar teaspoon): Material enters airway, passes BELOW cords and not ejected out despite cough attempt by patient;Material enters airway, passes BELOW cords without attempt by patient to eject out (silent  aspiration) Pharyngeal - Solids Pharyngeal - Puree: Premature spillage to pyriform sinuses;Reduced laryngeal elevation;Reduced airway/laryngeal closure;Reduced tongue base retraction;Penetration/Aspiration during swallow;Penetration/Aspiration after swallow;Moderate aspiration;Pharyngeal residue - pyriform sinuses;Inter-arytenoid space residue;Compensatory strategies attempted (Comment) Penetration/Aspiration details (puree): Material enters airway, passes BELOW cords and not ejected out despite cough attempt by patient;Material enters airway, passes BELOW cords without attempt by patient to eject out (silent aspiration)  Cervical Esophageal Phase   Amulya Quintin L. Tivis Ringer, [place] CCC/SLP Pager [phone]               [name] 04/05/2014, 3:04 PM

## 2014-04-05 NOTE — Progress Notes (Signed)
Speech Language Pathology Treatment:    Patient Details Name: RENAN DANESE MRN: [id number] DOB: July 28, 1924 Today's Date: 04/05/2014 Time: 9381-8299 SLP Time Calculation (min): 14 min  Assessment / Plan / Recommendation Clinical Impression  Late entry from am session: f/u to determine readiness for FEES.  Pt continues with overt coughing after consumption of trials of purees; he is more lucid and able to discuss prior difficulty with swallowing, which he reports has deteriorated in last three months.  Min verbal cues required to facilitate improved swallow - will proceed with FEES this afternoon.  Pt in agreement.    HPI HPI: 80 yo M with PMH of CVA (1978), Afib, CHF, IBS, GERD, and torticollis presented to ED 10/5 via EMS with neck pain that radiates to his jaw, emergently intubated by anesthesia due to respiratory failure following administration of pain medication. OETT 7 10/5>>>10/7.  Extubated per ENT under direct laryngoscopy.  Noted with severe torticollis. Basically normal larynx with bilateral mobile vocal cords.  Continues with NG today.   Pt describes hx of "trouble swallowing" that worsens when he becomes ill.  Pt had clinical swallow evaluation on 10/8, which revealed s/s worrisome for aspiration.  Dys 1 diet started; NPO over w/e due to increased difficulty.     Pertinent Vitals Pain Assessment: No/denies pain  SLP Plan       Recommendations Medication Administration: Via alternative means   FEES today           Oral Care Recommendations:  (qd)       [name] 04/05/2014, 3:09 PM

## 2014-04-05 NOTE — Progress Notes (Addendum)
Occupational Therapy Treatment Patient Details Name: Eduardo Deleon MRN: [id number] DOB: Mar 11, 1925 Today's Date: 04/05/2014    History of present illness 79 yo M with PMH of CVA, Afib, CHF, IBS, GERD, and torticollis presented to ED 10/5 via EMS with neck pain that radiates to his jaw, emergently intubated by anesthesia due to respiratory failure following administration of pain medication. 10/7  Extubated in OR with ENT.   OT comments  Pt progressing, but HR in 160-180's during session. Returned to chair and alarm set-HR trended down to 120's.. Nurse notified of HR. Continue to recommend SNF.  Follow Up Recommendations  SNF    Equipment Recommendations  Other (comment) (tbd)    Recommendations for Other Services      Precautions / Restrictions Precautions Precautions: Fall Restrictions Weight Bearing Restrictions: No       Mobility Bed Mobility               General bed mobility comments: not assessed  Transfers Overall transfer level: Needs assistance Equipment used: Rolling walker (2 wheeled) Transfers: Sit to/from Stand Sit to Stand: Mod assist         General transfer comment: assist to stand. cues for technique.    Balance                                   ADL Overall ADL's : Needs assistance/impaired     Grooming: Wash/dry face;Min guard;Standing   Upper Body Bathing: Min guard;Standing   Lower Body Bathing: Sit to/from stand;Minimal assistance (did not wash feet)   Upper Body Dressing : Sitting;Minimal assistance (assisted with gown-HR high)   Lower Body Dressing: Sit to/from stand;Moderate assistance   Toilet Transfer: Ambulation;RW;BSC;Moderate assistance           Functional mobility during ADLs: Rolling walker;Moderate assistance General ADL Comments: Pt performed ADLs at sink. Pt HR very high in session. Pt reports caregiver helps some with bath.  Spoke with pt about taking a break when needed.       Vision                      Perception     Praxis      Cognition   Awake/Alert Behavior During Therapy: WFL for tasks assessed/performed Overall Cognitive Status: Difficult to assess due to [facility]                       Extremity/Trunk Assessment               Exercises     Shoulder Instructions       General Comments      Pertinent Vitals/ Pain       Pain Assessment: No/denies pain  Home Living                                          Prior Functioning/Environment              Frequency Min 2X/week     Progress Toward Goals  OT Goals(current goals can now be found in the care plan section)  Progress towards OT goals: Progressing toward goals  Acute Rehab OT Goals Patient Stated Goal: not stated OT Goal Formulation: With patient Time For Goal Achievement: 04/17/14 Potential to Achieve Goals: Good ADL  Goals Pt Will Perform Upper Body Bathing: with set-up Pt Will Perform Lower Body Bathing: with min assist Pt Will Perform Upper Body Dressing: with set-up Pt Will Perform Lower Body Dressing: with set-up Pt Will Transfer to Toilet: with supervision Pt Will Perform Toileting - Clothing Manipulation and hygiene: with supervision  Plan Discharge plan remains appropriate    Co-evaluation                 End of Session Equipment Utilized During Treatment: Gait belt;Rolling walker;Oxygen   Activity Tolerance Other (comment) (increased HR)   Patient Left in chair;with call bell/phone within reach;with chair alarm set   Nurse Communication Mobility status;Other (comment) (HR)        Time: 1056-1130 OT Time Calculation (min): 34 min  Charges: OT General Charges $OT Visit: 1 Procedure OT Treatments $Self Care/Home Management : 23-37 mins  Benito Mccreedy OTR/L [phone] 04/05/2014, 11:54 AM

## 2014-04-05 NOTE — Progress Notes (Signed)
[place] for Eliquis Indication: atrial fibrillation  No Known Allergies  Patient Measurements: Height: 5\' 7"  (170.2 cm) Weight: 121 lb 14.6 oz (55.3 kg) IBW/kg (Calculated) : 66.1  Vital Signs: Temp: 97.3 F (36.3 C) (10/12 0429) Temp Source: Oral (10/12 0429) BP: 138/88 mmHg (10/12 0429) Pulse Rate: 99 (10/12 0429)  Labs:  Recent Labs  04/03/14 0336  HGB 11.8*  HCT 35.3*  PLT 121*  CREATININE 0.92    Estimated Creatinine Clearance: 44.2 ml/min (by C-G formula based on Cr of 0.92).   Medical History: Past Medical History  Diagnosis Date  . 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 Atrial fibrillation     chronic anticaog - weintraub  . Congestive heart failure   . Anemia   . Osteoarthritis   . Splenic lesion   . Venous stasis dermatitis     hx venous ulcer/cellulitis 06/2011 R and 04/2011 L  . Prostate cancer   . Anal fissure     Hx of  . Internal hemorrhoids   . Diverticulosis 04/12/1998    Left colon--Flex Sig  . CRI (chronic renal insufficiency)   . GERD (gastroesophageal reflux disease)   . PVD (peripheral vascular disease)   . Torticollis, acquired   . Tubular adenoma     Medications:  Scheduled:  . apixaban  2.5 mg Oral BID  . chlorhexidine  15 mL Mouth Rinse BID  . cholecalciferol  2,000 Units Oral Daily  . diltiazem  180 mg Oral Daily  . doxazosin  2 mg Oral QHS  . furosemide  20 mg Oral Daily  . magnesium oxide  400 mg Oral Daily  . metoprolol  2.5 mg Intravenous Once  . pantoprazole sodium  40 mg Per Tube Daily  . pneumococcal 23 valent vaccine  0.5 mL Intramuscular Tomorrow-1000  . potassium chloride  10 mEq Oral Daily    Assessment: 89 YOM presented on 10/5 with neck pain, was given morphine and began to have respiratory distress and required intubation for airway protection.  He has a history of atrial fibrillation on Eliquis for anticoagulation. There was concern for epiglottitis and required  extubation in the OR with larygnoscopy on 10/7, so Eliquis was held.  Pharmacy is consulted to restart Eliquis. Wt 55.3 kg, SCr 0.92. H/H and platelets stable, no signs of bleeding.  Goal of Therapy:  Monitor platelets by anticoagulation protocol: Yes   Plan:  -Continue Eliquis 2.5 mg po BID.  -Monitor renal function, CBC, signs of bleeding  Thanks for allowing pharmacy to be a part of this patient's care.  Excell Seltzer, PharmD Clinical Pharmacist, [phone] 04/05/2014, 10:23 AM

## 2014-04-05 NOTE — Progress Notes (Signed)
PULMONARY / CRITICAL CARE MEDICINE   Name: Eduardo Deleon MRN: [id number] DOB: 1924-10-25    ADMISSION DATE:  03/29/2014 CONSULTATION DATE:  03/29/14  REFERRING MD :  ED physician  CHIEF COMPLAINT:  Neck pain  INITIAL PRESENTATION: 80 yo M with PMH of CVA, Afib, CHF, IBS, GERD, and torticollis presented to ED 10/5 via EMS with neck pain that radiates to his jaw, emergently intubated by anesthesia due to respiratory failure following administration of pain medication.    STUDIES:  10/5  CT head and neck - B pleural effusions, R>L, severe torticollis, no mention of abscess, difficult exam due to anatomy 10/5  CXR L medial basilar consolidation vs. atelectasis. Pulmonary edema. 10/6  CXR LLL atelectasis and/or infiltrate. Pulmonary edema clearing. 10/7  CXR  Bibasilar subsegmental atelectasis. No pulmonary edema. Stable cardiomegaly.  10/8  Swallow eval - D1 diet, pudding thick liquids 10/10 SLP eval.  Rec NPO for now.  Plan to f/u with further puree trials.  Likely will need FEES 10/12  SIGNIFICANT EVENTS: 10/5  Presented to ED with neck pain radiating to jaw.  Medicated for pain, then had progressive distress.  Intubated in ER by Anesthesia.  10/7  Extubated in OR with ENT: no laryngeal edema noted. 10/8  To floor, swallow eval  10/10 PT and OT eval.  Both rec SNF for rehab  SUBJECTIVE:  nad in chair , very [place]: Temp:  [97.3 F (36.3 C)-97.6 F (36.4 C)] 97.3 F (36.3 C) (10/12 0429) Pulse Rate:  [97-99] 99 (10/12 0429) Resp:  [18] 18 (10/12 0429) BP: (126-138)/(79-91) 138/88 mmHg (10/12 0429) SpO2:  [99 %-100 %] 100 % (10/12 0429) Weight:  [121 lb 14.6 oz (55.3 kg)] 121 lb 14.6 oz (55.3 kg) (10/12 0429)  INTAKE / OUTPUT:  Intake/Output Summary (Last 24 hours) at 04/05/14 1046 Last data filed at 04/05/14 0815  Gross per 24 hour  Intake      0 ml  Output    300 ml  Net   -300 ml   PHYSICAL EXAMINATION: General:  Frail elderly man in no distress Neuro:   Awake, alert, NAD HEENT:  Severe torticollis with clockwise rotation and rightward deviation Cardiovascular: irregulary irregular, HR 90's, no murmur or JVD. HR 120's when active Lungs: resp's even/non-labored, lungs bilaterally clear, wet voice & cough Abdomen:  Soft, non-tender, non-distended Musculoskeletal:  No edema Skin:  Warm, dry, petechiae noted B LE's  LABS:  CBC  Recent Labs Lab 03/31/14 0219 04/01/14 1020 04/03/14 0336  WBC 2.8* 6.0 4.5  HGB 10.9* 11.6* 11.8*  HCT 32.2* 34.3* 35.3*  PLT 119* 140* 121*   Coag's  Recent Labs Lab 03/29/14 1423  INR 1.46   BMET  Recent Labs Lab 03/31/14 0219 04/01/14 1020 04/03/14 0336  NA 136* 137 139  K 4.0 3.9 3.8  CL 101 105 102  CO2 21 21 24   BUN 18 20 20   CREATININE 0.98 0.98 0.92  GLUCOSE 140* 111* 103*   Glucose  Recent Labs Lab 03/31/14 1550 03/31/14 1925 04/01/14 0036 04/01/14 0345 04/01/14 0744 04/01/14 1258  GLUCAP 163* 187* 140* 133* 121* 109*    Imaging No results found.  ASSESSMENT / PLAN:  PULMONARY OETT 7 10/5>>>10/7 A: Acute Respiratory Failure - suspect this was largely anatomical in setting of sedation and neck pain. His epiglottis and cords were normal on direct laryngoscopy 10/7 PNA - LLL consolidation vs. atelectasis on CXR - improved High aspiration risk (hx CVA, torticollis, GERD)  P:   Wean oxygen to off for sats > 92% Pulmonary hygiene:  IS, mobilize  CARDIOVASCULAR A:  Afib, nonvalvular - chronic, rate controlled on daily cardizem & eliquis Hx CHF (EF 60-65% 09/2013) Hx PVD P:  Cardizem 180 mg QD, currently on hold today 2/2 new NPO status.  Can use IV prn.  To get repeat eval by speech so hopefully can resume po soon. Now (10/12) with tachycardia with activity. Continue eliquis,  Home dose lasix 20 mg QD (hold 10/12)   RENAL A:   Chronic renal insufficiency - below baseline Cr (1.06-1.14) on admission Lab Results  Component Value Date   CREATININE 0.92  04/03/2014   CREATININE 0.98 04/01/2014   CREATININE 0.98 03/31/2014   CREATININE 1.14 03/23/2013    P:  Correct electrolytes as needed Monitor BMET KVO  GI/GU Foley 10/5>>> A:   GERD Diverticulosis Hx prostate cancer BPH Dysphagia P:  Diet per SLP recommendations SUP: PPI home doxazosin and finasteride (on hold)  HEMATOLOGIC A:  Anemia, mild - at baseline, stable Lab Results  Component Value Date   HGB 11.8* 04/03/2014   HGB 11.6* 04/01/2014   HGB 10.9* 03/31/2014    Thrombocytopenia - admission platelets 121, baseline wnl Lab Results  Component Value Date   PLT 121* 04/03/2014   PLT 140* 04/01/2014   PLT 119* 03/31/2014    P:  Monitor CBC: plt's stable, hgb stable  SCDs  INFECTIOUS A:   Left Infiltrate vs. atelectasis - concern for aspiration PNA Laryngeal edema, r/o epiglottitis - resolved BCx2  10/5 >> neg RVP 10/5 >> Neg P:    Abx: Unasyn 10/5 >>>10/7; no evidence of epiglottis on lary so abx stopped early  ENDOCRINE A:   Mild hyperglycemia  P:   Monitor glucose on BMP   NEUROLOGIC A:  Hx CVA  Torticollis SLP eval 10/9 > D I       P :  For FEES 10/12, may need PEG.   Discussion: Oral meds on hold due to impeding FEES.  HR elevated but ca channel blocker on hold.   Richardson Landry Minor ACNP Maryanna Shape PCCM Pager [phone] till 3 pm If no answer page [phone] 04/05/2014, 10:51 AM  [name], MD, PhD 04/05/2014, 11:47 AM [facility] [phone] or if no answer [phone]

## 2014-04-05 NOTE — Progress Notes (Signed)
PT Cancellation Note  Patient Details Name: Eduardo Deleon MRN: [id number] DOB: March 22, 1925   Cancelled Treatment:    Reason Eval/Treat Not Completed: Medical issues which prohibited therapy (HR 160-180 with activity with OT)   Philomena Doheny 04/05/2014, 11:26 AM [phone]

## 2014-04-06 LAB — BASIC METABOLIC PANEL
Anion gap: 19 — ABNORMAL HIGH (ref 5–15)
BUN: 22 mg/dL (ref 6–23)
CALCIUM: 8.7 mg/dL (ref 8.4–10.5)
CHLORIDE: 104 meq/L (ref 96–112)
CO2: 25 meq/L (ref 19–32)
CREATININE: 0.86 mg/dL (ref 0.50–1.35)
GFR calc Af Amer: 88 mL/min — ABNORMAL LOW (ref >90)
GFR calc non Af Amer: 76 mL/min — ABNORMAL LOW (ref >90)
GLUCOSE: 97 mg/dL (ref 70–99)
Potassium: 3.3 mEq/L — ABNORMAL LOW (ref 3.7–5.3)
Sodium: 148 mEq/L — ABNORMAL HIGH (ref 137–147)

## 2014-04-06 LAB — CBC
HCT: 38 % — ABNORMAL LOW (ref 39.0–52.0)
HEMOGLOBIN: 12.7 g/dL — AB (ref 13.0–17.0)
MCH: 29.2 pg (ref 26.0–34.0)
MCHC: 33.4 g/dL (ref 30.0–36.0)
MCV: 87.4 fL (ref 78.0–100.0)
Platelets: 168 10*3/uL (ref 150–400)
RBC: 4.35 MIL/uL (ref 4.22–5.81)
RDW: 14.1 % (ref 11.5–15.5)
WBC: 5.7 10*3/uL (ref 4.0–10.5)

## 2014-04-06 LAB — HEPARIN LEVEL (UNFRACTIONATED): Heparin Unfractionated: 0.98 IU/mL — ABNORMAL HIGH (ref 0.30–0.70)

## 2014-04-06 LAB — APTT
APTT: 39 s — AB (ref 24–37)
APTT: 64 s — AB (ref 24–37)

## 2014-04-06 MED ORDER — PANTOPRAZOLE SODIUM 40 MG IV SOLR
40.0000 mg | INTRAVENOUS | Status: DC
  Administered 2014-04-06 – 2014-04-09 (×4): 40 mg via INTRAVENOUS
  Filled 2014-04-06 (×6): qty 40

## 2014-04-06 MED ORDER — POTASSIUM CHLORIDE 10 MEQ/100ML IV SOLN
10.0000 meq | INTRAVENOUS | Status: AC
  Administered 2014-04-06 (×4): 10 meq via INTRAVENOUS
  Filled 2014-04-06 (×4): qty 100

## 2014-04-06 MED ORDER — METOPROLOL TARTRATE 1 MG/ML IV SOLN
5.0000 mg | Freq: Once | INTRAVENOUS | Status: AC
  Administered 2014-04-06: 5 mg via INTRAVENOUS
  Filled 2014-04-06: qty 5

## 2014-04-06 MED ORDER — HEPARIN (PORCINE) IN NACL 100-0.45 UNIT/ML-% IJ SOLN
800.0000 [IU]/h | INTRAMUSCULAR | Status: DC
  Administered 2014-04-06: 850 [IU]/h via INTRAVENOUS
  Administered 2014-04-06: 750 [IU]/h via INTRAVENOUS
  Administered 2014-04-07 (×2): 1000 [IU]/h via INTRAVENOUS
  Administered 2014-04-08: 800 [IU]/h via INTRAVENOUS
  Administered 2014-04-08: 900 [IU]/h via INTRAVENOUS
  Administered 2014-04-09: 800 [IU]/h via INTRAVENOUS
  Filled 2014-04-06 (×5): qty 250

## 2014-04-06 MED ORDER — METOPROLOL TARTRATE 1 MG/ML IV SOLN
2.5000 mg | INTRAVENOUS | Status: DC | PRN
  Administered 2014-04-07: 2.5 mg via INTRAVENOUS
  Administered 2014-04-08 (×2): 5 mg via INTRAVENOUS
  Filled 2014-04-06 (×4): qty 5

## 2014-04-06 NOTE — Progress Notes (Signed)
NUTRITION FOLLOW UP  Intervention:   If decision made to place PEG or NGT, recommend the following TF regimen: Initiated Osmolite 1.2 @ 20 ml/hr and increase by 10 ml/hr every 4 hours to goal rate of 60 ml/hr. This will provide 1728 kcal, 80 grams of protein, and 1180 ml of water daily.   RD to continue to monitor nutrition plan.   Nutrition Dx:   Inadequate oral intake related to inability to eat as evidenced by NPO status; ongoing  Goal:   Pt to meet >/= 90% of their estimated nutrition needs; unmet  Monitor:   Diet advancement/PO intake, TF tolerance/adequacy, weight trend, labs, vent status  Assessment:   80 yo M with PMH of CVA, Afib, CHF, IBS, GERD, and torticollis presented to ED 10/5 via EMS with neck pain that radiates to his jaw, emergently intubated by anesthesia due to respiratory failure following administration of pain medication.   Pt was extubated 10/7 and diet was advanced to Dysphagia 1 with pudding-thick liquids on 10/8. Per nursing notes pt ate 25% to 50% of meals on 10/9. Pt was re-assessed by SLP on 10/10 with recommendations for NPO status; pt later failed FEES on 10/12. Per MD note, "need to discuss PEG tube with patient. Unfortunately, he is confused on exam 10/13". Pt continued to be confused at time of RD visit at 1500 hr; pt awake and talking to self.   Pt's weight has dropped significantly from 147 lbs to 114 lbs. Unsure of accuracy of weight but, pt does appear thin.  -5.5 L net I/O since admission  Labs reviewed: Elevated sodium, low potassium.    Height: Ht Readings from Last 1 Encounters:  04/01/14 5\' 7"  (1.702 m)    Weight Status:   Wt Readings from Last 1 Encounters:  04/06/14 114 lb 13.8 oz (52.1 kg)  03/30/14 147 lb 02/23/14 136 lb 05/27/13 140 lb  Re-estimated needs:  Kcal: 1600-1800 Protein: 70-80 grams Fluid: 1.6 L/day  Skin: +1 generalized edema; stage 1 pressure ulcer on buttocks  Diet Order: NPO   Intake/Output Summary (Last 24  hours) at 04/06/14 1532 Last data filed at 04/06/14 0832  Gross per 24 hour  Intake      0 ml  Output    500 ml  Net   -500 ml    Last BM: 10/9   Labs:   Recent Labs Lab 04/01/14 1020 04/03/14 0336 04/06/14 0424  NA 137 139 148*  K 3.9 3.8 3.3*  CL 105 102 104  CO2 21 24 25   BUN 20 20 22   CREATININE 0.98 0.92 0.86  CALCIUM 8.4 8.1* 8.7  GLUCOSE 111* 103* 97    CBG (last 3)  No results found for this basename: GLUCAP,  in the last 72 hours  Scheduled Meds: . chlorhexidine  15 mL Mouth Rinse BID  . cholecalciferol  2,000 Units Oral Daily  . diltiazem  180 mg Oral Daily  . doxazosin  2 mg Oral QHS  . furosemide  20 mg Oral Daily  . magnesium oxide  400 mg Oral Daily  . metoprolol  2.5 mg Intravenous Once  . pantoprazole (PROTONIX) IV  40 mg Intravenous Q24H  . pneumococcal 23 valent vaccine  0.5 mL Intramuscular Tomorrow-1000    Continuous Infusions: . heparin 750 Units/hr (04/06/14 1234)    Pryor Ochoa RD, LDN Inpatient Clinical Dietitian Pager: [phone] After Hours Pager: [phone]

## 2014-04-06 NOTE — Progress Notes (Signed)
In addition to paging attending physician, I have also paged [name], MD. No return call back from either doctors at this time.

## 2014-04-06 NOTE — Progress Notes (Signed)
PULMONARY / CRITICAL CARE MEDICINE   Name: Eduardo Deleon MRN: [id number] DOB: 1925/04/26    ADMISSION DATE:  03/29/2014 CONSULTATION DATE:  03/29/14  REFERRING MD :  ED physician  CHIEF COMPLAINT:  Neck pain  INITIAL PRESENTATION: 80 yo M with PMH of CVA, Afib, CHF, IBS, GERD, and torticollis presented to ED 10/5 via EMS with neck pain that radiates to his jaw, emergently intubated by anesthesia due to respiratory failure following administration of pain medication.    STUDIES:  10/5  CT head and neck - B pleural effusions, R>L, severe torticollis, no mention of abscess, difficult exam due to anatomy 10/5  CXR L medial basilar consolidation vs. atelectasis. Pulmonary edema. 10/6  CXR LLL atelectasis and/or infiltrate. Pulmonary edema clearing. 10/7  CXR  Bibasilar subsegmental atelectasis. No pulmonary edema. Stable cardiomegaly.  10/8  Swallow eval - D1 diet, pudding thick liquids 10/10 SLP eval.  Rec NPO for now.  Plan to f/u with further puree trials.  Likely will need FEES 10/12 10/12 FEES >>> failed  SIGNIFICANT EVENTS: 10/5  Presented to ED with neck pain radiating to jaw.  Medicated for pain, then had progressive distress.  Intubated in ER by Anesthesia.  10/7  Extubated in OR with ENT: no laryngeal edema noted. 10/8  To floor, swallow eval  10/10 PT and OT eval.  Both rec SNF for rehab  SUBJECTIVE:  Confused and somewhat combative overnight, required sitter.  Had some A.fib with RVR (HR 130s - 140s).  Responded well to 5mg  Lopressor.  Pt clearly confused / delirious this AM (different from 10/12)  VITAL SIGNS: Temp:  [97.9 F (36.6 C)-98 F (36.7 C)] 98 F (36.7 C) (10/13 0414) Pulse Rate:  [71-112] 99 (10/13 0900) Resp:  [18-20] 20 (10/13 0414) BP: (115-134)/(75-98) 130/98 mmHg (10/13 0900) SpO2:  [99 %-100 %] 99 % (10/13 0414) Weight:  [52.1 kg (114 lb 13.8 oz)] 52.1 kg (114 lb 13.8 oz) (10/13 0414)  INTAKE / OUTPUT:  Intake/Output Summary (Last 24 hours) at  04/06/14 1106 Last data filed at 04/06/14 0832  Gross per 24 hour  Intake      0 ml  Output   1700 ml  Net  -1700 ml   PHYSICAL EXAMINATION: General:  Frail elderly man, resting in bed, in no distress Neuro:  Confused, does not answer questions appropriately. Mumbling > worse voice quality HEENT:  Severe torticollis with clockwise rotation and rightward deviation Cardiovascular: irregulary irregular, no M/R/G. Lungs: resp's even/non-labored, lungs bilaterally clear, wet voice & cough. Abdomen:  Soft, non-tender, non-distended Musculoskeletal:  No edema Skin:  Warm, dry, petechiae noted B LE's  LABS:  CBC  Recent Labs Lab 04/01/14 1020 04/03/14 0336 04/06/14 0424  WBC 6.0 4.5 5.7  HGB 11.6* 11.8* 12.7*  HCT 34.3* 35.3* 38.0*  PLT 140* 121* 168   Coag's No results found for this basename: APTT, INR,  in the last 168 hours BMET  Recent Labs Lab 04/01/14 1020 04/03/14 0336 04/06/14 0424  NA 137 139 148*  K 3.9 3.8 3.3*  CL 105 102 104  CO2 21 24 25   BUN 20 20 22   CREATININE 0.98 0.92 0.86  GLUCOSE 111* 103* 97   Glucose  Recent Labs Lab 03/31/14 1550 03/31/14 1925 04/01/14 0036 04/01/14 0345 04/01/14 0744 04/01/14 1258  GLUCAP 163* 187* 140* 133* 121* 109*    Imaging No results found.  ASSESSMENT / PLAN:  PULMONARY OETT 7 10/5>>>10/7 A: Acute Respiratory Failure - suspect this was largely  anatomical in setting of sedation and neck pain. His epiglottis and cords were normal on direct laryngoscopy 10/7 PNA - LLL consolidation vs. atelectasis on CXR - improved High aspiration risk (hx CVA, torticollis, GERD)  P:   Wean oxygen to off for sats > 92% Pulmonary hygiene:  IS, mobilize.  CARDIOVASCULAR A:  Afib, nonvalvular - chronic, rate controlled on daily cardizem & eliquis (however, failed FEES 10/12 so unable to take PO meds). Hx CHF (EF 60-65% 09/2013) Hx PVD P:  Cardizem 180 mg QD; however, unable to take PO's as failed FEES 10/12. PRN  Lopressor to maintain HR < 115. Hold Eliquis while NPO. Heparin for now until can resume PO meds (possibly per tube if PEG tube is placed). Home dose lasix 20 mg QD (hold 10/13).  RENAL A:   Chronic renal insufficiency - below baseline Cr (1.06-1.14) on admission  Hypokalemia P:   Potassium x 4 runs. Monitor BMET KVO  GI/GU Foley 10/5>>> A:   GERD Diverticulosis Hx prostate cancer BPH Dysphagia P:  NPO since failed FEES. Need to discuss PEG tube with patient.  Unfortunately, he is confused on exam 10/13, therefore would hold off this discussion today, consider in AM 10/14. SUP: PPI home doxazosin and finasteride (on hold)  HEMATOLOGIC A:  Anemia, mild - at baseline, stable  Thrombocytopenia - admission platelets 121, baseline wnl  P:  Monitor CBC. SCDs  INFECTIOUS A:   Left Infiltrate vs. atelectasis - concern for aspiration PNA Laryngeal edema, r/o epiglottitis - resolved BCx2  10/5 >> neg RVP 10/5 >> Neg P:   Abx: Unasyn 10/5 >>>10/7; no evidence of epiglottitis on direct laryngoscopy so abx stopped early  ENDOCRINE A:   Mild hyperglycemia  P:   Monitor glucose on BMP  NEUROLOGIC A:  Delirium Hx CVA Severe Torticollis SLP eval 10/9 > failed FEES       P :   May need PEG, pt confused 10/13 so unable to have discussion. May need to consider palliative care consult.   Discussion: Failed FEES so unable to take PO's.  Will start heparin gtt in place of Eliquis. May need to get palliative care involved.  Pt is clearly confused therefore can not have discussion regarding PEG today. Consider attempting this in AM 10/14.  Will also attempt to involve family in discussions - he has a Publishing rights manager. Care order placed asking RN to hold all PO meds for now.   [name], [facility] Pgr: [phone]  or [phone]   [name], MD, PhD 04/06/2014, 11:52 AM [facility] [phone] or  if no answer [phone]

## 2014-04-06 NOTE — Progress Notes (Signed)
ANTICOAGULATION CONSULT NOTE - Follow Up Consult  Pharmacy Consult for heparin Indication: atrial fibrillation, unable to take po meds  No Known Allergies  Patient Measurements: Height: 5\' 7"  (170.2 cm) Weight: 114 lb 13.8 oz (52.1 kg) (bedscale) IBW/kg (Calculated) : 66.1   Vital Signs: Temp: 98 F (36.7 C) (10/13 0414) Temp Source: Oral (10/13 0414) BP: 130/98 mmHg (10/13 0900) Pulse Rate: 99 (10/13 0900)  Labs:  Recent Labs  04/06/14 0424  HGB 12.7*  HCT 38.0*  PLT 168  CREATININE 0.86    Estimated Creatinine Clearance: 44.6 ml/min (by C-G formula based on Cr of 0.86).   Medications:  Prescriptions prior to admission  Medication Sig Dispense Refill  . apixaban (ELIQUIS) 2.5 MG TABS tablet Take 1 tablet (2.5 mg total) by mouth 2 (two) times daily.  180 tablet  2  . cholecalciferol (VITAMIN D) 1000 UNITS tablet Take 2,000 Units by mouth daily.        Marland Kitchen diltiazem (CARDIZEM CD) 180 MG 24 hr capsule Take 1 capsule (180 mg total) by mouth daily.  30 capsule  11  . doxazosin (CARDURA) 2 MG tablet Take 2 mg by mouth at bedtime.        . finasteride (PROSCAR) 5 MG tablet Take 5 mg by mouth daily.      . furosemide (LASIX) 20 MG tablet Take 20 mg by mouth daily.      . iron polysaccharides (FERREX 150) 150 MG capsule Take 1 capsule (150 mg total) by mouth daily.  30 capsule  5  . magnesium oxide (MAG-OX) 400 MG tablet Take 400 mg by mouth daily.      . potassium chloride (KLOR-CON) 10 MEQ CR tablet Take 10 mEq by mouth daily.       . [DISCONTINUED] omeprazole (PRILOSEC) 20 MG capsule Take 1 capsule (20 mg total) by mouth daily.  90 capsule  3    Assessment: 80 yo man on eliquis for afib.  He failed swallow study and is now NPO.  Last dose of eliquis was yesterday at 10:42. We will need to monitor aPTT until the effects of eliquis have worn off.  Goal of Therapy:  Heparin level 0.3-0.7 units/ml aPTT 66-102 seconds Monitor platelets by anticoagulation protocol: Yes    Plan:  Check baseline heparin level and aPTT.  Start heparin drip at 750 units/hr Check heparin level and aPTT 8 hours after start  Thanks for allowing pharmacy to be a part of this patient's care.  Excell Seltzer, PharmD Clinical Pharmacist, [phone] 04/06/2014,11:28 AM

## 2014-04-06 NOTE — Progress Notes (Signed)
Physical Therapy Treatment Patient Details Name: Eduardo Deleon MRN: [id number] DOB: Oct 28, 1924 Today's Date: 04/06/2014    History of Present Illness 80 yo M with PMH of CVA, Afib, CHF, IBS, GERD, and torticollis presented to ED 10/5 via EMS with neck pain that radiates to his jaw, emergently intubated by anesthesia due to respiratory failure following administration of pain medication. 10/7  Extubated in OR with ENT.    PT Comments    Pt limited by increased HR this session. At rest, HR at 70bpm. Per RN, PT clear to continue with limited mobility while monitoring HR. At EOB HR increased into 140's. Pt cued to stay seated, however he was impulsive with transfers. With his attempts to stand, HR increased to 187bpm at its highest. Further mobility was then deferred and pt was returned to supine. RN notified.  Follow Up Recommendations  SNF (Pt refusing SNF or HHPT)     Equipment Recommendations  None recommended by PT    Recommendations for Other Services       Precautions / Restrictions Precautions Precautions: Fall Restrictions Weight Bearing Restrictions: No    Mobility  Bed Mobility Overal bed mobility: Needs Assistance;+ 2 for safety/equipment Bed Mobility: Supine to Sit;Sit to Supine;Rolling Rolling: Min assist   Supine to sit: Mod assist;+2 for safety/equipment Sit to supine: Max assist;+2 for physical assistance   General bed mobility comments: Pt somewhat impulsive with transfers, and required +2 assist for safety. Pt was able to assist with transfer to sitting EOB, however required +2 physical assist to return to supine.   Transfers                 General transfer comment: Pt impulsive with initiating sit>stand. HR increased to 187 with attempt to stand and further mobility was deferred at this time.   Ambulation/Gait             General Gait Details: Deferred due to tachycardia   Stairs            Wheelchair Mobility    Modified  Rankin (Stroke Patients Only)       Balance Overall balance assessment: Needs assistance Sitting-balance support: Feet supported;Bilateral upper extremity supported Sitting balance-Leahy Scale: Poor Sitting balance - Comments: Required assist of UE's for static sitting.                            Cognition Arousal/Alertness: Awake/alert Behavior During Therapy: WFL for tasks assessed/performed Overall Cognitive Status: Difficult to assess                      Exercises      General Comments        Pertinent Vitals/Pain      Home Living                      Prior Function            PT Goals (current goals can now be found in the care plan section) Acute Rehab PT Goals Patient Stated Goal: not stated PT Goal Formulation: With patient Time For Goal Achievement: 04/09/14 Potential to Achieve Goals: Good Progress towards PT goals: Progressing toward goals    Frequency  Min 3X/week    PT Plan Current plan remains appropriate    Co-evaluation             End of Session Equipment Utilized During Treatment: Oxygen Activity Tolerance:  Treatment limited secondary to medical complications (Comment) Patient left: in bed;with call bell/phone within reach;with nursing/sitter in room     Time: 4709-6283 PT Time Calculation (min): 17 min  Charges:  $Therapeutic Activity: 8-22 mins                    G Codes:      Rolinda Roan 2014-05-06, 11:55 AM  Rolinda Roan, PT, DPT Acute Rehabilitation Services Pager: [phone]

## 2014-04-06 NOTE — Progress Notes (Signed)
Patient confused combative at times. Trying to get OOB multiple times. HR increases with activity reaching up to 130's 140's then goes back down to upper 90' to low 100's when pt is not moving.continued to monitor.

## 2014-04-06 NOTE — Progress Notes (Signed)
Pt is NPO, and meds are needed for Heart rate running in the 160's. Called office to get on call physician paged for Dr.Simonds. Spoke with answering service. Stated that md will be paged.

## 2014-04-06 NOTE — Progress Notes (Signed)
Dr. Lamonte Sakai called back and was notified of Heart rate and NPO status due to being unable to take po medications. Will place new orders

## 2014-04-06 NOTE — Progress Notes (Signed)
ANTICOAGULATION CONSULT NOTE - Follow Up Consult  Pharmacy Consult for heparin Indication: atrial fibrillation, unable to take po meds  No Known Allergies  Patient Measurements: Height: 5\' 7"  (170.2 cm) Weight: 114 lb 13.8 oz (52.1 kg) (bedscale) IBW/kg (Calculated) : 66.1   Vital Signs: Temp: 97.8 F (36.6 C) (10/13 1530) Temp Source: Axillary (10/13 1530) BP: 136/77 mmHg (10/13 1530) Pulse Rate: 98 (10/13 1530)  Labs:  Recent Labs  04/06/14 0424 04/06/14 1215 04/06/14 1846  HGB 12.7*  --   --   HCT 38.0*  --   --   PLT 168  --   --   APTT  --  39* 64*  HEPARINUNFRC  --   --  0.98*  CREATININE 0.86  --   --     Estimated Creatinine Clearance: 44.6 ml/min (by C-G formula based on Cr of 0.86).   Medications:  Prescriptions prior to admission  Medication Sig Dispense Refill  . apixaban (ELIQUIS) 2.5 MG TABS tablet Take 1 tablet (2.5 mg total) by mouth 2 (two) times daily.  180 tablet  2  . cholecalciferol (VITAMIN D) 1000 UNITS tablet Take 2,000 Units by mouth daily.        Marland Kitchen diltiazem (CARDIZEM CD) 180 MG 24 hr capsule Take 1 capsule (180 mg total) by mouth daily.  30 capsule  11  . doxazosin (CARDURA) 2 MG tablet Take 2 mg by mouth at bedtime.        . finasteride (PROSCAR) 5 MG tablet Take 5 mg by mouth daily.      . furosemide (LASIX) 20 MG tablet Take 20 mg by mouth daily.      . iron polysaccharides (FERREX 150) 150 MG capsule Take 1 capsule (150 mg total) by mouth daily.  30 capsule  5  . magnesium oxide (MAG-OX) 400 MG tablet Take 400 mg by mouth daily.      . potassium chloride (KLOR-CON) 10 MEQ CR tablet Take 10 mEq by mouth daily.       . [DISCONTINUED] omeprazole (PRILOSEC) 20 MG capsule Take 1 capsule (20 mg total) by mouth daily.  90 capsule  3    Assessment: 80 yo man on eliquis for afib.  He failed swallow study and is now NPO.  Last dose of eliquis was yesterday at 10:42. We will need to monitor aPTT until the effects of eliquis have worn off.  Baseline aPTT was 39. Follow-up aPTT is slightly subtherapeutic at 64 and HL is 0.98, which is not representative of true level based on Eliquis.  Goal of Therapy:  Heparin level 0.3-0.7 units/ml aPTT 66-102 seconds Monitor platelets by anticoagulation protocol: Yes   Plan:  Increase heparin drip to 850 units/hr Check heparin level and aPTT with AM labs  Andrey Cota. Diona Foley, PharmD Clinical Pharmacist Pager [phone]  04/06/2014,7:23 PM

## 2014-04-07 DIAGNOSIS — Z789 Other specified health status: Secondary | ICD-10-CM

## 2014-04-07 DIAGNOSIS — Z515 Encounter for palliative care: Secondary | ICD-10-CM

## 2014-04-07 DIAGNOSIS — R410 Disorientation, unspecified: Secondary | ICD-10-CM

## 2014-04-07 DIAGNOSIS — R1314 Dysphagia, pharyngoesophageal phase: Secondary | ICD-10-CM

## 2014-04-07 LAB — CBC
HEMATOCRIT: 36.6 % — AB (ref 39.0–52.0)
Hemoglobin: 12.1 g/dL — ABNORMAL LOW (ref 13.0–17.0)
MCH: 29.2 pg (ref 26.0–34.0)
MCHC: 33.1 g/dL (ref 30.0–36.0)
MCV: 88.2 fL (ref 78.0–100.0)
Platelets: 163 10*3/uL (ref 150–400)
RBC: 4.15 MIL/uL — ABNORMAL LOW (ref 4.22–5.81)
RDW: 14.2 % (ref 11.5–15.5)
WBC: 5.1 10*3/uL (ref 4.0–10.5)

## 2014-04-07 LAB — BASIC METABOLIC PANEL
Anion gap: 18 — ABNORMAL HIGH (ref 5–15)
BUN: 19 mg/dL (ref 6–23)
CALCIUM: 8.6 mg/dL (ref 8.4–10.5)
CO2: 24 meq/L (ref 19–32)
CREATININE: 0.87 mg/dL (ref 0.50–1.35)
Chloride: 109 mEq/L (ref 96–112)
GFR calc Af Amer: 87 mL/min — ABNORMAL LOW (ref >90)
GFR calc non Af Amer: 75 mL/min — ABNORMAL LOW (ref >90)
Glucose, Bld: 88 mg/dL (ref 70–99)
Potassium: 3.3 mEq/L — ABNORMAL LOW (ref 3.7–5.3)
SODIUM: 151 meq/L — AB (ref 137–147)

## 2014-04-07 LAB — APTT
APTT: 93 s — AB (ref 24–37)
aPTT: 60 seconds — ABNORMAL HIGH (ref 24–37)

## 2014-04-07 LAB — HEPARIN LEVEL (UNFRACTIONATED)
HEPARIN UNFRACTIONATED: 0.78 [IU]/mL — AB (ref 0.30–0.70)
HEPARIN UNFRACTIONATED: 0.96 [IU]/mL — AB (ref 0.30–0.70)

## 2014-04-07 LAB — GLUCOSE, CAPILLARY: Glucose-Capillary: 88 mg/dL (ref 70–99)

## 2014-04-07 NOTE — Consult Note (Signed)
Patient Eduardo Deleon      DOB: 1924-11-07      CNO:[id number]     Consult Note from the Palliative Medicine Team at [facility] Requested by: Dr Lamonte Sakai     PCP: Gwendolyn Grant, MD Reason for Consultation:Goals of Care/Peg    Phone Number:[phone]  Assessment/Recommendations: 80 yo male with PMHx of CVA, afib, torticollis who presented with neck/jaw pain required intubation after pain med administration with hypoxia.  Continued confusion and aspiration concerns here.   1.  Code Status: Full  2. Goals of Care: Spoke with Eduardo Deleon who is documented HCPOA (in paper chart).  He discussed swallowing concerns with Speech and is not sure if Eduardo Deleon would want feeding tube placement.  He describes Eduardo Deleon as Librarian, academic independent. They have wanted to place him in assisted living facility with his wife, but he has been adamant about staying home. This has been ongoing for months. He was still ambulatory prior to admission, but had hired caregivers to help him at home. He is not at functional baseline.  Eduardo Deleon is concerned about how things would look after feeding tube for Eduardo Deleon and what this would mean for prognosis if placed or not.  With review of FEES and severe dysphagia noted, certainly this is challenging case.  My concern would be that feeding tube would not prevent aspiration for him. I suspect given degree of dysphagia that he would still aspirate oral secretions leading to infections, etc.  I am not sure a feeding tube would improve his survival or quality of life.  Eduardo Deleon will review living will Eduardo Deleon made and think about conversations from today. Eduardo Deleon would also like to hear second opinion from CCM or hospitalist.    I will reconnect with [facility] tomorrow afternoon as well.   3. Symptom Management:   1. Dysphagia- severe on FEES. Had passed swallow eval on 04/10/2023 but failed 10/10. Issues surrounding potential PEG as above.  2. Confusion- Likely related to acute events with  intubation here. Recommend family bring in hearing aid when able. Agree with sitter. Would avoid benzo's.    4. Psychosocial/Spiritual: Lives at home in [place] with assistance from occasional home health aides. HCPOA is godson Eduardo Deleon who lives in [place].  He never had any children of his own.    Brief HPI: Patient is an 80 yo male with PMHx of CVA, Afib, torticollis who presented on 03/29/14 with neck pain radiating into his jaw. He was given pain medicine on admission which lead to hypoxia and need for intubation.  There was some concern for PNA 2/2 possible aspiration at that time as well vs laryngeal edema. He underwent direct laryngoscopy which did not show and laryngeal edema however.  He was subsequently extubated and passed swallow eval on 04-10-2023 with dysphagia diet. Bedside nurse noted coughing when eating and repeat swallow eval concerning for aspiration and made NPO. Had FEES done which showed continued aspiration events.  There was discussion with POA about possible PEG placement with POA showing some concern about impact on QOL and whether or not Eduardo Deleon would want this intervention. Palliative care subsequently consulted.  In meeting Eduardo Deleon today, he is hard of hearing and confused. He has Engineer, materials. He is unable to tell me that he is at [facility] and can not tell me why he is in hospital or events that have occurred here.     ROS: limited 2/2 hearing and mild encephalopathy    PMH:  Past Medical History  Diagnosis Date  . 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 Atrial fibrillation     chronic anticaog - weintraub  . Congestive heart failure   . Anemia   . Osteoarthritis   . Splenic lesion   . Venous stasis dermatitis     hx venous ulcer/cellulitis 06/2011 R and 04/2011 L  . Prostate cancer   . Anal fissure     Hx of  . Internal hemorrhoids   . Diverticulosis 04/12/1998    Left colon--Flex Sig  . CRI (chronic renal insufficiency)   . GERD (gastroesophageal  reflux disease)   . PVD (peripheral vascular disease)   . Torticollis, acquired   . Tubular adenoma      PSH: Past Surgical History  Procedure Laterality Date  . Joint replacement  bilaterial knees  . Lumbar laminectomy  Per patient  heriated disc in mid spine  . Hernia repair      [phone], right - 1985  . Hemorrhoid surgery    . Tonsillectomy    . Colonoscopy      1988  . Direct laryngoscopy N/A 03/31/2014    Procedure: DIRECT LARYNGOSCOPY/EXAM UNDER ANESTHESIA;  Surgeon: [name], MD;  Location: [facility];  Service: ENT;  Laterality: N/A;   I have reviewed the [facility] and [facility] and  If appropriate update it with new information. No Known Allergies Scheduled Meds: . chlorhexidine  15 mL Mouth Rinse BID  . cholecalciferol  2,000 Units Oral Daily  . diltiazem  180 mg Oral Daily  . doxazosin  2 mg Oral QHS  . furosemide  20 mg Oral Daily  . magnesium oxide  400 mg Oral Daily  . metoprolol  2.5 mg Intravenous Once  . pantoprazole (PROTONIX) IV  40 mg Intravenous Q24H  . pneumococcal 23 valent vaccine  0.5 mL Intramuscular Tomorrow-1000   Continuous Infusions: . heparin 1,000 Units/hr (04/07/14 0628)   PRN Meds:.sodium chloride, metoprolol, RESOURCE THICKENUP CLEAR    BP 142/85  Pulse 100  Temp(Src) 97.9 F (36.6 C) (Oral)  Resp 18  Ht 5\' 7"  (1.702 m)  Wt 53.071 kg (117 lb)  BMI 18.32 kg/m2  SpO2 100%   PPS: Currently 40   Intake/Output Summary (Last 24 hours) at 04/07/14 1537 Last data filed at 04/07/14 1440  Gross per 24 hour  Intake      0 ml  Output    875 ml  Net   -875 ml    Physical Exam:  General: Alert, NAD HEENT:  [place], sclera anicteric Neck: torticollis Chest:   CTAB, symm exp CVS: Regular rate Abdomen: soft, NT, ND Ext: no c/c/e Neuro: hard of hearing, confused  Labs: CBC    Component Value Date/Time   WBC 5.1 04/07/2014 0536   RBC 4.15* 04/07/2014 0536   RBC 3.99* 05/20/2011 1352   HGB 12.1* 04/07/2014 0536   HCT 36.6* 04/07/2014 0536    PLT 163 04/07/2014 0536   MCV 88.2 04/07/2014 0536   MCH 29.2 04/07/2014 0536   MCHC 33.1 04/07/2014 0536   RDW 14.2 04/07/2014 0536   LYMPHSABS 0.6* 04/01/2014 1020   MONOABS 0.3 04/01/2014 1020   EOSABS 0.0 04/01/2014 1020   BASOSABS 0.0 04/01/2014 1020    BMET    Component Value Date/Time   NA 148* 04/06/2014 0424   K 3.3* 04/06/2014 0424   CL 104 04/06/2014 0424   CO2 25 04/06/2014 0424   GLUCOSE 97 04/06/2014 0424   BUN 22 04/06/2014 0424  CREATININE 0.86 04/06/2014 0424   CREATININE 1.14 03/23/2013 1648   CALCIUM 8.7 04/06/2014 0424   GFRNONAA 76* 04/06/2014 0424   GFRAA 88* 04/06/2014 0424    CMP     Component Value Date/Time   NA 148* 04/06/2014 0424   K 3.3* 04/06/2014 0424   CL 104 04/06/2014 0424   CO2 25 04/06/2014 0424   GLUCOSE 97 04/06/2014 0424   BUN 22 04/06/2014 0424   CREATININE 0.86 04/06/2014 0424   CREATININE 1.14 03/23/2013 1648   CALCIUM 8.7 04/06/2014 0424   PROT 5.9* 10/08/2013 1159   ALBUMIN 3.2* 10/08/2013 1159   AST 44* 10/08/2013 1159   ALT 20 10/08/2013 1159   ALKPHOS 82 10/08/2013 1159   BILITOT 0.9 10/08/2013 1159   GFRNONAA 76* 04/06/2014 0424   GFRAA 88* 04/06/2014 0424     Total Time: 60 minutes  Greater than 50%  of this time was spent counseling and coordinating care related to the above assessment and plan.    Doran Clay D.O. Palliative Medicine Team at [facility]  Pager: [phone] Team Phone: [phone]

## 2014-04-07 NOTE — Progress Notes (Signed)
SPEECH LANGUAGE PATHOLOGY  This clinician spoke with Mr. Plucinski POA and [name],  this am via phone.  We discussed the results of recent FEES, deterioration of swallow, and the need to address nutrition.  Explained in brief the benefits/burdens of PEG.  Mr. Lucy Antigua verbalized his worry that PEG may not be consistent with Mr. Suriano desires.  The pt has been confused and not capable of making decisions. Pt's wife is currently under the care of hospice and has advanced dementia.  After discussion, Mr. Lucy Antigua requested a conversation with PCCM as well as Palliative Care involvement.   I phoned [name], NP,  to convey content of our discussion.  She will f/u with Dr. Lamonte Sakai and Palliative Medicine.  Saranya Harlin L. Tivis Ringer, [place] CCC/SLP Pager [phone]

## 2014-04-07 NOTE — Progress Notes (Signed)
Pt. HR sustaining 170-180s. Sitter at the bedside, pt anxious and trying to get comfortable in bed. Pt pulled up in bed and made comfortable. Orders for metoprolol IV PRN given to patient for HR sustaining. Will continue to monitor.

## 2014-04-07 NOTE — Progress Notes (Signed)
R/t to [facility] 0.96, I spoke to [place] in pharmacy and he stated that he will speak to the pharmacist about possible changing the heparin gtts rate that is currently going at 46mL/hr, and will call me back at [phone] with any rate changes.

## 2014-04-07 NOTE — Progress Notes (Signed)
ANTICOAGULATION CONSULT NOTE - Follow Up Consult  Pharmacy Consult for heparin Indication: atrial fibrillation  Labs:  Recent Labs  04/06/14 0424 04/06/14 1215 04/06/14 1846 04/07/14 0536  HGB 12.7*  --   --  12.1*  HCT 38.0*  --   --  36.6*  PLT 168  --   --  163  APTT  --  39* 64* 60*  HEPARINUNFRC  --   --  0.98* 0.78*  CREATININE 0.86  --   --   --     Assessment: 80yo male remains subtherapeutic on heparin with lower PTT after rate increase.  Goal of Therapy:  Heparin level 0.3-0.7 units/ml aPTT 66-102 seconds   Plan:  Will increase heparin gtt by 3 units/kg/hr to 1000 units/hr and check level in [place], PharmD, BCPS  04/07/2014,6:22 AM

## 2014-04-07 NOTE — Progress Notes (Signed)
ANTICOAGULATION CONSULT NOTE - Follow Up Consult  Pharmacy Consult for heparin Indication: atrial fibrillation, unable to take po meds  No Known Allergies  Patient Measurements: Height: 5\' 7"  (170.2 cm) Weight: 117 lb (53.071 kg) (bedscale) IBW/kg (Calculated) : 66.1   Vital Signs: Temp: 97.9 F (36.6 C) (10/14 1440) Temp Source: Oral (10/14 1440) BP: 142/85 mmHg (10/14 1440) Pulse Rate: 100 (10/14 1440)  Labs:  Recent Labs  04/06/14 0424  04/06/14 1846 04/07/14 0536 04/07/14 1427  HGB 12.7*  --   --  12.1*  --   HCT 38.0*  --   --  36.6*  --   PLT 168  --   --  163  --   APTT  --   < > 64* 60* 93*  HEPARINUNFRC  --   --  0.98* 0.78* 0.96*  CREATININE 0.86  --   --   --  0.87  < > = values in this interval not displayed.  Estimated Creatinine Clearance: 44.9 ml/min (by C-G formula based on Cr of 0.87).  Assessment: 80 yo man on eliquis for afib.  He failed swallow study and is now NPO.  Last dose of eliquis was 10/13 at 10:42. We will monitor aPTTs until the effects of eliquis have worn off.  APTT is therapeutic at 93 seconds after rate increased from 850 to 1000 units/hr this morning. CBC remains stable and no bleeding reported.  Goal of Therapy:  Heparin level 0.3-0.7 units/ml aPTT 66-102 seconds Monitor platelets by anticoagulation protocol: Yes   Plan:  Continue heparin at 1000 units/hr Check heparin level and aPTT with AM labs  Eudelia Bunch, Pharm.D. [phone] 04/07/2014 4:24 PM

## 2014-04-07 NOTE — Progress Notes (Signed)
PULMONARY / CRITICAL CARE MEDICINE   Name: Eduardo Deleon MRN: [id number] DOB: 1925/02/18    ADMISSION DATE:  03/29/2014 CONSULTATION DATE:  03/29/14   REFERRING MD :  ED physician  CHIEF COMPLAINT:  Neck pain  INITIAL PRESENTATION: 80 yo M with PMH of CVA, Afib, CHF, IBS, GERD, and torticollis presented to ED 10/5 via EMS with neck pain that radiates to his jaw, emergently intubated by anesthesia due to respiratory failure following administration of pain medication.    STUDIES:  10/5  CT head and neck - B pleural effusions, R>L, severe torticollis, no mention of abscess, difficult exam due to anatomy 10/5  CXR L medial basilar consolidation vs. atelectasis. Pulmonary edema. 10/6  CXR LLL atelectasis and/or infiltrate. Pulmonary edema clearing. 10/7  CXR  Bibasilar subsegmental atelectasis. No pulmonary edema. Stable cardiomegaly.  10/8  Swallow eval - D1 diet, pudding thick liquids 10/10 SLP eval.  Rec NPO for now.  Plan to f/u with further puree trials.  Likely will need FEES 10/12 10/12 FEES >>> failed  SIGNIFICANT EVENTS: 10/5  Presented to ED with neck pain radiating to jaw.  Medicated for pain, then had progressive distress.  Intubated in ER by Anesthesia.  10/7  Extubated in OR with ENT: no laryngeal edema noted. 10/8  To floor, swallow eval  10/10 PT and OT eval.  Both rec SNF for rehab  SUBJECTIVE:  Remains confused, sitter at bedside. NAD.    VITAL SIGNS: Temp:  [97.8 F (36.6 C)-98.1 F (36.7 C)] 97.9 F (36.6 C) (10/14 0622) Pulse Rate:  [98-116] 116 (10/14 0622) Resp:  [18-20] 18 (10/14 0622) BP: (129-142)/(77-86) 129/78 mmHg (10/14 0622) SpO2:  [95 %-99 %] 95 % (10/14 0622) Weight:  [117 lb (53.071 kg)] 117 lb (53.071 kg) (10/14 0622)  INTAKE / OUTPUT:  Intake/Output Summary (Last 24 hours) at 04/07/14 1011 Last data filed at 04/07/14 0911  Gross per 24 hour  Intake      0 ml  Output    625 ml  Net   -625 ml   PHYSICAL EXAMINATION: General:  Frail  elderly man, resting in bed, in no distress Neuro:  Confused, does not answer questions appropriately. Mumbling.   HEENT:  Severe torticollis with clockwise rotation and rightward deviation, mm dry  Cardiovascular: irregulary irregular, no M/R/G. Lungs: resp's even/non-labored, lungs bilaterally clear, wet voice & cough.  Abdomen:  Soft, non-tender, non-distended Musculoskeletal:  No edema Skin:  Warm, dry, petechiae noted B LE's  LABS:  CBC  Recent Labs Lab 04/03/14 0336 04/06/14 0424 04/07/14 0536  WBC 4.5 5.7 5.1  HGB 11.8* 12.7* 12.1*  HCT 35.3* 38.0* 36.6*  PLT 121* 168 163   Coag's  Recent Labs Lab 04/06/14 1215 04/06/14 1846 04/07/14 0536  APTT 39* 64* 60*   BMET  Recent Labs Lab 04/01/14 1020 04/03/14 0336 04/06/14 0424  NA 137 139 148*  K 3.9 3.8 3.3*  CL 105 102 104  CO2 21 24 25   BUN 20 20 22   CREATININE 0.98 0.92 0.86  GLUCOSE 111* 103* 97   Glucose  Recent Labs Lab 03/31/14 1550 03/31/14 1925 04/01/14 0036 04/01/14 0345 04/01/14 0744 04/01/14 1258  GLUCAP 163* 187* 140* 133* 121* 109*    Imaging No results found.  ASSESSMENT / PLAN:  PULMONARY OETT 7 10/5>>>10/7 A: Acute Respiratory Failure - Initially thought r/t epiglottitis but suspect this was largely anatomical in setting of sedation and neck pain. His epiglottis and cords were normal on direct laryngoscopy  10/7 PNA - LLL consolidation vs. atelectasis on CXR - improved High aspiration risk (hx CVA, torticollis, GERD)  P:   Wean oxygen to off for sats > 92% Pulmonary hygiene:  IS, mobilize. F/u CXR intermittently   CARDIOVASCULAR A:  Afib, nonvalvular - chronic, rate controlled on daily cardizem & eliquis (however, failed FEES 10/12 so unable to take PO meds). Hx CHF (EF 60-65% 09/2013) Hx PVD P:  Cardizem 180 mg QD; however, unable to take PO's as failed FEES 10/12. PRN Lopressor to maintain HR < 115. Hold Eliquis while NPO. Heparin for now until can resume PO meds  (possibly per tube if PEG tube is placed). Home dose lasix 20 mg QD (hold 10/13).  RENAL A:   Chronic renal insufficiency - baseline Cr (1.06-1.14)  Hypokalemia Hypernatremia  P:   Monitor BMET now and in am  KVO  GI/GU Foley 10/5>>> A:   GERD Diverticulosis Hx prostate cancer BPH Dysphagia P:  NPO since failed FEES. Need to discuss PEG tube with patient.  Unfortunately, he is increasingly confused - see below  SUP: PPI home doxazosin and finasteride (on hold)  HEMATOLOGIC A:  Anemia, mild - at baseline, stable  Thrombocytopenia - admission platelets 121, baseline wnl  P:  Monitor CBC. SCDs  INFECTIOUS A:   Left Infiltrate vs. atelectasis - concern for aspiration PNA Laryngeal edema, r/o epiglottitis - resolved BCx2  10/5 >> neg RVP 10/5 >> Neg P:   Abx: Unasyn 10/5 >>>10/7; no evidence of epiglottitis on direct laryngoscopy so abx stopped early  ENDOCRINE A:   Mild hyperglycemia  P:   Monitor glucose on BMP  NEUROLOGIC A:  Delirium Hx CVA Severe Torticollis SLP eval 10/9 > failed FEES       P :   Likely needs PEG Consider palliative care consult    Discussion: Failed FEES so unable to take PO's.  On heparin gtt for now.  Likely needs PEG but unable to d/w pt r/t increasing confusion (?etiology). May need to get palliative care involved.  ?Wife's ability to participate.  Does have one godson Eduardo Deleon > communicated with him 10/14. Continue to hold all PO meds for now.  Will ask [facility] to assume care 10/15.    Eduardo Madrid, NP 04/07/2014  10:11 AM Pager: [phone] or [phone]  *Care during the described time interval was provided by me and/or other providers on the critical care team. I have reviewed this patient's available data, including medical history, events of note, physical examination and test results as part of my evaluation.  Attending Note: I agree with the above. My concern is that he has acute on chronic aspiration, would  need a PEG. Unclear that he would want this kind of change in his QOL - he has been independent up until now. Will ask Palliative Care to help [place].   Eduardo Apo, MD, PhD 04/07/2014, 12:34 PM [facility] [phone] or if no answer [phone]

## 2014-04-08 DIAGNOSIS — I5022 Chronic systolic (congestive) heart failure: Secondary | ICD-10-CM

## 2014-04-08 DIAGNOSIS — I482 Chronic atrial fibrillation, unspecified: Secondary | ICD-10-CM | POA: Diagnosis present

## 2014-04-08 LAB — BASIC METABOLIC PANEL
ANION GAP: 19 — AB (ref 5–15)
BUN: 21 mg/dL (ref 6–23)
CHLORIDE: 109 meq/L (ref 96–112)
CO2: 24 mEq/L (ref 19–32)
CREATININE: 0.84 mg/dL (ref 0.50–1.35)
Calcium: 8.7 mg/dL (ref 8.4–10.5)
GFR, EST AFRICAN AMERICAN: 89 mL/min — AB (ref >90)
GFR, EST NON AFRICAN AMERICAN: 76 mL/min — AB (ref >90)
Glucose, Bld: 93 mg/dL (ref 70–99)
Potassium: 3.2 mEq/L — ABNORMAL LOW (ref 3.7–5.3)
Sodium: 152 mEq/L — ABNORMAL HIGH (ref 137–147)

## 2014-04-08 LAB — GLUCOSE, CAPILLARY
GLUCOSE-CAPILLARY: 101 mg/dL — AB (ref 70–99)
GLUCOSE-CAPILLARY: 92 mg/dL (ref 70–99)
Glucose-Capillary: 96 mg/dL (ref 70–99)
Glucose-Capillary: 97 mg/dL (ref 70–99)

## 2014-04-08 LAB — HEPARIN LEVEL (UNFRACTIONATED)
Heparin Unfractionated: 0.94 IU/mL — ABNORMAL HIGH (ref 0.30–0.70)
Heparin Unfractionated: 0.82 IU/mL — ABNORMAL HIGH (ref 0.30–0.70)

## 2014-04-08 LAB — APTT
aPTT: 118 seconds — ABNORMAL HIGH (ref 24–37)
aPTT: 99 seconds — ABNORMAL HIGH (ref 24–37)

## 2014-04-08 MED ORDER — SODIUM CHLORIDE 0.9 % IV SOLN: 250.0000 mL | INTRAVENOUS | Status: DC | PRN

## 2014-04-08 NOTE — Progress Notes (Signed)
Occupational Therapy Treatment Patient Details Name: Eduardo Deleon MRN: [id number] DOB: Dec 07, 1924 Today's Date: 04/08/2014    History of present illness 80 yo M with PMH of CVA, Afib, CHF, IBS, GERD, and torticollis presented to ED 10/5 via EMS with neck pain that radiates to his jaw, emergently intubated by anesthesia due to respiratory failure following administration of pain medication. 10/7  Extubated in OR with ENT.   OT comments  Pt seen today to address functional mobility and positioning for eating to increase safety. Pt appeared to have decreased cognition and memory and difficulty understanding purpose of OT session. Pt with decreased awareness of safety. Pt will continue to benefit from acute OT. Pt declining SNF at this time and will need HHOT and HHaide.    Follow Up Recommendations  SNF;Other (comment) pt declining SNF and will need HHOT, HHaide;Supervision/Assistance - 24 hour    Equipment Recommendations  None recommended by OT    Recommendations for Other Services      Precautions / Restrictions Precautions Precautions: Fall Restrictions Weight Bearing Restrictions: No       Mobility Bed Mobility Overal bed mobility: Needs Assistance;+ 2 for safety/equipment             General bed mobility comments: Assisted pt in scooting up in bed. Pt impulsive and sat up using elbows, however required assist to scoot up.   Transfers                 General transfer comment: Pt declined at this time. Appeared somewhat irritated with therapist and stated "I just want to get this over with."         ADL Overall ADL's : Needs assistance/impaired Eating/Feeding: Set up;Sitting Eating/Feeding Details (indicate cue type and reason): After discussing positioning for increased safety with eating, attempted to reposition pt in bed (he declined chair). Pt became somewhat irritated stating "this is how I've been doing it." NT then asked pt to sit up in bed for  dinner and pt agreed easily and was assisted by OT and NT into better position for dinner tray without difficulty.                                    General ADL Comments: Pt declined getting to chair for dinner despite attempts to educate pt on safe positioning. Pt may have some decreased cognition by evening and did not recall working with PT earlier. He seemed to have difficulty understanding role of therapy and stated "Everyone seems so concerned with what I'm going to do when I go home. I'll go back to normal, it's not like I'm going to some black hole." Attempted to explain to pt safety concerns with no evidence of pt awareness/understanding.                 Cognition  Arousal/Alertness: Awake/Alert Behavior During Therapy: WFL for tasks assessed/performed Overall Cognitive Status: Difficult to assess Area of Impairment: Memory;Safety/judgement;Problem solving;Awareness     Memory: Decreased short-term memory    Safety/Judgement: Decreased awareness of safety Awareness: Anticipatory Problem Solving: Slow processing;Requires verbal cues General Comments: Per communication with PT, pt appears to have decreased cognition from this morning. Question whether this is baseline for patient. Pt had difficulty understanding purpose of OT despite many attempts to explained. Discussed proper positioning for pt to increase safety with eating and pt did not appear to have insight into reasoning for  concern.                  Pertinent Vitals/ Pain       Pain Assessment: No/denies pain         Frequency Min 2X/week     Progress Toward Goals  OT Goals(current goals can now be found in the care plan section)  Progress towards OT goals: Progressing toward goals  Acute Rehab OT Goals Patient Stated Goal: not stated ADL Goals Pt Will Perform Upper Body Bathing: with set-up Pt Will Perform Lower Body Bathing: with min assist Pt Will Perform Upper Body Dressing: with  set-up Pt Will Perform Lower Body Dressing: with set-up Pt Will Transfer to Toilet: with supervision Pt Will Perform Toileting - Clothing Manipulation and hygiene: with supervision  Plan Discharge plan remains appropriate       End of Session Equipment Utilized During Treatment: Oxygen   Activity Tolerance Patient tolerated treatment well;Other (comment) (pt slightly irritated)   Patient Left in bed;with call bell/phone within reach   Nurse Communication          Time: 6387-5643 OT Time Calculation (min): 18 min  Charges: OT General Charges $OT Visit: 1 Procedure OT Treatments $Self Care/Home Management : 8-22 mins  Villa Herb M 04/08/2014, 5:16 PM  Cyndie Chime, OTR/L Occupational Therapist [phone] (pager)

## 2014-04-08 NOTE — Progress Notes (Signed)
Patient Eduardo Deleon      DOB: Dec 04, 1924      XTK:[id number]   Palliative Medicine Team at [facility] Progress Note    Subjective: States he is feeling fine today but tired. Was glad to have water this morning. Able to recall conversation with Eduardo Deleon this morning.  Confirms with me that he would not want PEG tube.  No complaints for me today.     Filed Vitals:   04/08/14 1411  BP: 142/100  Pulse: 108  Temp: 98 F (36.7 C)  Resp: 15   Physical exam: GEN: alert, NAD, cognition improved today HEENT: Eduardo Deleon, sclera anicteric Skin: warm/dry   CBC    Component Value Date/Time   WBC 5.1 04/07/2014 0536   RBC 4.15* 04/07/2014 0536   RBC 3.99* 05/20/2011 1352   HGB 12.1* 04/07/2014 0536   HCT 36.6* 04/07/2014 0536   PLT 163 04/07/2014 0536   MCV 88.2 04/07/2014 0536   MCH 29.2 04/07/2014 0536   MCHC 33.1 04/07/2014 0536   RDW 14.2 04/07/2014 0536   LYMPHSABS 0.6* 04/01/2014 1020   MONOABS 0.3 04/01/2014 1020   EOSABS 0.0 04/01/2014 1020   BASOSABS 0.0 04/01/2014 1020    CMP     Component Value Date/Time   NA 152* 04/08/2014 0648   K 3.2* 04/08/2014 0648   CL 109 04/08/2014 0648   CO2 24 04/08/2014 0648   GLUCOSE 93 04/08/2014 0648   BUN 21 04/08/2014 0648   CREATININE 0.84 04/08/2014 0648   CREATININE 1.14 03/23/2013 1648   CALCIUM 8.7 04/08/2014 0648   PROT 5.9* 10/08/2013 1159   ALBUMIN 3.2* 10/08/2013 1159   AST 44* 10/08/2013 1159   ALT 20 10/08/2013 1159   ALKPHOS 82 10/08/2013 1159   BILITOT 0.9 10/08/2013 1159   GFRNONAA 76* 04/08/2014 0648   GFRAA 89* 04/08/2014 0648    Assessment/Recommendations:  80 yo male with PMHx of CVA, afib, torticollis who presented with neck/jaw pain required intubation after pain med administration with hypoxia. Continued confusion and aspiration concerns here.   1. Code Status: DNR   2. Goals of Care:  See initial consult note.  Eduardo Deleon is more cognitively appropriate toady and able to recall conversation  about aspiration and feeding tubes with Eduardo Deleon this morning.  Reports that this was difficult conversation but that he would not want a PEG tube.  Knows about continued aspiration risk and I informed him that I am not sure how much a PEG would help this anyway. When I tried to talk to him about where to go from here, he states he had enough conversation for today and did not want to talk about it.  I talked again at length with his POA Eduardo Deleon.  Eduardo Deleon is concerned that he will not do well at home. He refused nursing home in past and indicated to PT that he did not want HHPT or SNF while here this time.  Eduardo Deleon states he always refuses home health.  Eduardo Deleon has it set up so someone buys him groceries once per week and checks on him but understandably worried about his functional status being worse after 10 day hospitalization. Suspect dispo will be challenging. I think with Eduardo Deleon likely recurrent aspiration, he would meet home hospice criteria (not residential), but Eduardo Deleon is concerned he would refuse any help. Will see if Eduardo Deleon lets [place] discuss further tomorrow.   3. Symptom Management:  1. Dysphagia- severe on FEES. Had passed swallow eval on  10/8 but failed 10/10. No PEG. Would allow comfort feeds 2. Confusion-Improving as acute issues resolve.   4. Psychosocial/Spiritual: Lives at home in [place] with assistance from occasional home health aides. HCPOA is godson Eduardo Deleon who lives in [place]. He never had any children of his own.   Total time 30 minutes  >50% of time spent in counseling and coordination of care regarding above assessment and plan.  Eduardo Deleon D.O. Palliative Medicine Team at [facility]  Pager: [phone] Team Phone: [phone]

## 2014-04-08 NOTE — Progress Notes (Signed)
ANTICOAGULATION CONSULT NOTE - Follow Up Consult  Pharmacy Consult for heparin Indication: atrial fibrillation, unable to take po meds  No Known Allergies  Patient Measurements: Height: 5\' 7"  (170.2 cm) Weight: 111 lb 5.3 oz (50.5 kg) IBW/kg (Calculated) : 66.1   Vital Signs: Temp: 98 F (36.7 C) (10/15 1411) Temp Source: Oral (10/15 1411) BP: 142/100 mmHg (10/15 1411) Pulse Rate: 108 (10/15 1411)  Labs:  Recent Labs  04/06/14 0424  04/07/14 0536 04/07/14 1427 04/08/14 0648 04/08/14 1442  HGB 12.7*  --  12.1*  --   --   --   HCT 38.0*  --  36.6*  --   --   --   PLT 168  --  163  --   --   --   APTT  --   < > 60* 93* 118* 99*  HEPARINUNFRC  --   < > 0.78* 0.96* 0.94* 0.82*  CREATININE 0.86  --   --  0.87 0.84  --   < > = values in this interval not displayed.  Estimated Creatinine Clearance: 44.3 ml/min (by C-G formula based on Cr of 0.84).  Assessment: 80 yo man on eliquis PTA for afib.  He failed swallow study and is now NPO.  Last dose of eliquis was 10/13 at 10:42. Earlier today, aPTT was supra therapeutic at 118 seconds and HL was 0.94 units/mL- appeared to be correlating. A recheck after a heparin rate decrease resulted with a persistently high heparin level at 0.82 units/mL and aPTT within range at 99 seconds. Noted that with patient's age, the heparin level was checked a little bit before true steady state, but would suspect it would still be elevated had it been checked a couple hours later. Heparin level and aPTT still appear to be correlating. CBC remains stable and no bleeding reported.  Goal of Therapy:  Heparin level 0.3-0.7 units/ml aPTT 66-102 seconds Monitor platelets by anticoagulation protocol: Yes   Plan:  1. Decrease heparin to 800 units/hr (2unit/kg/hr decrease) 2. Check heparin level in 8 hours 3. Daily HL and CBC  Selassie Spatafore D. Angelique Chevalier, PharmD, BCPS Clinical Pharmacist Pager: [phone] 04/08/2014 4:32 PM

## 2014-04-08 NOTE — Progress Notes (Signed)
[facility] PROGRESS NOTE Interim History: 80 yo M with PMH of CVA, Afib, CHF, IBS, GERD, and torticollis presented to ED 10/5 via EMS with neck pain that radiates to his jaw, emergently intubated by anesthesia due to respiratory failure following administration of pain medication.    Assessment/Plan: Acute respiratory failure - suspect this was largely anatomical in setting of sedation and neck pain. His epiglottis and cords were normal on direct laryngoscopy 10/7. - High risk  Of aspiration. - Had a long discussion with patient with the nurse present. He decided he would like to eat and drink, he relates that if the PEG tube is not going to prevent him from aspirating or choking he would rather eat and drink. I have explained the risk and benefits to him and understand. He relates there is no point of living longer if he can't enjoy a regular thing he does. He relates he doesn't enjoy a lot of things but eating and drinking as one the thing he has left that he enjoys. - I have also discussed with him what happens when he aspiration or chokes he said he would rather be made comfortable than  Going through this again. - As I was leaving the room he told me that I would get a glass of water. - I will d/w PMT.  Chronic a-fib: - At home on Cardizem 180 mg QD; however, unable to take PO's as failed FEES 10/12.  - PRN Lopressor to maintain HR < 115.  - Hold Eliquis while NPO.  - Heparin for now until can resume PO meds   CRI (chronic renal insufficiency) - Creatinine at baseline.    Code Status: DNR Family Communication: none  Disposition Plan: inpatient   Consultants:  PCCM   ENT  Procedures: 10/5 CT head and neck - B pleural effusions, R>L, severe torticollis, no mention of abscess, difficult exam due to anatomy  10/5 CXR L medial basilar consolidation vs. atelectasis. Pulmonary edema.  10/6 CXR LLL atelectasis and/or infiltrate. Pulmonary edema clearing.  10/7 CXR  Bibasilar subsegmental atelectasis. No pulmonary edema. Stable cardiomegaly.  10/8 Swallow eval - D1 diet, pudding thick liquids  10/10 SLP eval. Rec NPO for now. Plan to f/u with further puree trials. Likely will need FEES 10/12  10/12 FEES >>> failed   Antibiotics:  No   HPI/Subjective: Want to eat and drink  Objective: Filed Vitals:   04/07/14 1908 04/07/14 2156 04/08/14 0516 04/08/14 1052  BP: 136/87 139/96 151/92 137/101  Pulse: 69 97 116 120  Temp:  97.7 F (36.5 C) 98 F (36.7 C) 97.6 F (36.4 C)  TempSrc:  Oral Oral Oral  Resp:    20  Height:      Weight:   50.5 kg (111 lb 5.3 oz)   SpO2:  100% 100% 99%    Intake/Output Summary (Last 24 hours) at 04/08/14 1153 Last data filed at 04/08/14 0857  Gross per 24 hour  Intake      0 ml  Output    400 ml  Net   -400 ml   Filed Weights   04/06/14 0414 04/07/14 0622 04/08/14 0516  Weight: 52.1 kg (114 lb 13.8 oz) 53.071 kg (117 lb) 50.5 kg (111 lb 5.3 oz)    Exam:  General: , in no acute distress.  HEENT: Severe torticollis with clockwise rotation and rightward deviation Heart: irregular rate and rhythm. Lungs: Good air movement, clear Abdomen: Soft, nontender, nondistended, positive bowel sounds.  Data Reviewed: Basic Metabolic Panel:  Recent Labs Lab 04/03/14 0336 04/06/14 0424 04/07/14 1427 04/08/14 0648  NA 139 148* 151* 152*  K 3.8 3.3* 3.3* 3.2*  CL 102 104 109 109  CO2 24 25 24 24   GLUCOSE 103* 97 88 93  BUN 20 22 19 21   CREATININE 0.92 0.86 0.87 0.84  CALCIUM 8.1* 8.7 8.6 8.7   Liver Function Tests: No results found for this basename: AST, ALT, ALKPHOS, BILITOT, PROT, ALBUMIN,  in the last 168 hours No results found for this basename: LIPASE, AMYLASE,  in the last 168 hours No results found for this basename: AMMONIA,  in the last 168 hours CBC:  Recent Labs Lab 04/03/14 0336 04/06/14 0424 04/07/14 0536  WBC 4.5 5.7 5.1  HGB 11.8* 12.7* 12.1*  HCT 35.3* 38.0* 36.6*  MCV 87.6  87.4 88.2  PLT 121* 168 163   Cardiac Enzymes: No results found for this basename: CKTOTAL, CKMB, CKMBINDEX, TROPONINI,  in the last 168 hours BNP (last 3 results)  Recent Labs  10/09/13 1850  PROBNP 2371.0*   CBG:  Recent Labs Lab 04/07/14 2155 04/08/14 0224 04/08/14 0515 04/08/14 0830 04/08/14 1100  GLUCAP 88 96 97 101* 92    Recent Results (from the past 240 hour(s))  CULTURE, BLOOD (ROUTINE X 2)     Status: None   Collection Time    03/29/14  1:35 PM      Result Value Ref Range Status   Specimen Description BLOOD LEFT HAND   Final   Special Requests BOTTLES DRAWN AEROBIC AND ANAEROBIC 5CC   Final   Culture  Setup Time     Final   Value: 03/29/2014 17:07     Performed at [facility]   Culture     Final   Value: NO GROWTH 5 DAYS     Performed at [facility]   Report Status 04/04/2014 FINAL   Final  CULTURE, BLOOD (ROUTINE X 2)     Status: None   Collection Time    03/29/14  1:38 PM      Result Value Ref Range Status   Specimen Description BLOOD RIGHT FOREARM   Final   Special Requests BOTTLES DRAWN AEROBIC AND ANAEROBIC 5CC   Final   Culture  Setup Time     Final   Value: 03/29/2014 17:07     Performed at [facility]   Culture     Final   Value: NO GROWTH 5 DAYS     Performed at [facility]   Report Status 04/04/2014 FINAL   Final  CULTURE, RESPIRATORY (NON-EXPECTORATED)     Status: None   Collection Time    03/29/14  3:50 PM      Result Value Ref Range Status   Specimen Description TRACHEAL ASPIRATE   Final   Special Requests NONE   Final   Gram Stain     Final   Value: FEW WBC PRESENT,BOTH PMN AND MONONUCLEAR     RARE SQUAMOUS EPITHELIAL CELLS PRESENT     MODERATE GRAM POSITIVE COCCI     IN PAIRS IN CLUSTERS MODERATE GRAM NEGATIVE RODS     RARE GRAM POSITIVE RODS   Culture     Final   Value: Non-Pathogenic Oropharyngeal-type Flora Isolated.     Performed at [facility]   Report Status 04/01/2014 FINAL    Final  MRSA PCR SCREENING     Status: None   Collection Time  03/29/14  4:08 PM      Result Value Ref Range Status   MRSA by PCR NEGATIVE  NEGATIVE Final   Comment:            The GeneXpert MRSA Assay (FDA     approved for NASAL specimens     only), is one component of a     comprehensive MRSA colonization     surveillance program. It is not     intended to diagnose MRSA     infection nor to guide or     monitor treatment for     MRSA infections.  RESPIRATORY VIRUS PANEL     Status: None   Collection Time    03/29/14  4:09 PM      Result Value Ref Range Status   Source - RVPAN NASAL SWAB   Corrected   Comment: CORRECTED ON 10/09 AT 1144: PREVIOUSLY REPORTED AS NASAL SWAB   Respiratory Syncytial Virus A NOT DETECTED   Final   Respiratory Syncytial Virus B NOT DETECTED   Final   Influenza A NOT DETECTED   Final   Influenza B NOT DETECTED   Final   Parainfluenza 1 NOT DETECTED   Final   Parainfluenza 2 NOT DETECTED   Final   Parainfluenza 3 NOT DETECTED   Final   Metapneumovirus NOT DETECTED   Final   Rhinovirus NOT DETECTED   Final   Adenovirus NOT DETECTED   Final   Influenza A H1 NOT DETECTED   Final   Influenza A H3 NOT DETECTED   Final   Comment: (NOTE)           Normal Reference Range for each Analyte: NOT DETECTED     Testing performed using the Luminex xTAG Respiratory Viral Panel test     kit.     The analytical performance characteristics of this assay have been     determined by [facility].  The modifications have not been     cleared or approved by the FDA. This assay has been validated pursuant     to the CLIA regulations and is used for clinical purposes.     Performed at [facility]     Studies: No results found.  Scheduled Meds: . chlorhexidine  15 mL Mouth Rinse BID  . cholecalciferol  2,000 Units Oral Daily  . diltiazem  180 mg Oral Daily  . doxazosin  2 mg Oral QHS  . furosemide  20 mg Oral Daily  . magnesium oxide  400 mg  Oral Daily  . metoprolol  2.5 mg Intravenous Once  . pantoprazole (PROTONIX) IV  40 mg Intravenous Q24H  . pneumococcal 23 valent vaccine  0.5 mL Intramuscular Tomorrow-1000   Continuous Infusions: . heparin 900 Units/hr (04/08/14 0836)     Charlynne Cousins  [facility] Pager [phone]. If 8PM-8AM, please contact night-coverage at [email or web address], password [facility] 04/08/2014, 11:53 AM  LOS: 10 days

## 2014-04-08 NOTE — Progress Notes (Signed)
ANTICOAGULATION CONSULT NOTE - Follow Up Consult  Pharmacy Consult for heparin Indication: atrial fibrillation, unable to take po meds  No Known Allergies  Patient Measurements: Height: 5\' 7"  (170.2 cm) Weight: 111 lb 5.3 oz (50.5 kg) IBW/kg (Calculated) : 66.1   Vital Signs: Temp: 98 F (36.7 C) (10/15 0516) Temp Source: Oral (10/15 0516) BP: 151/92 mmHg (10/15 0516) Pulse Rate: 116 (10/15 0516)  Labs:  Recent Labs  04/06/14 0424  04/07/14 0536 04/07/14 1427 04/08/14 0648  HGB 12.7*  --  12.1*  --   --   HCT 38.0*  --  36.6*  --   --   PLT 168  --  163  --   --   APTT  --   < > 60* 93* 118*  HEPARINUNFRC  --   < > 0.78* 0.96* 0.94*  CREATININE 0.86  --   --  0.87 0.84  < > = values in this interval not displayed.  Estimated Creatinine Clearance: 44.3 ml/min (by C-G formula based on Cr of 0.84).  Assessment: 80 yo man on eliquis for afib.  He failed swallow study and is now NPO.  Last dose of eliquis was 10/13 at 10:42. We will monitor aPTTs until the effects of eliquis have worn off.  APTT is supra therapeutic at 118 seconds HL is 0.94  CBC remains stable and no bleeding reported.  Goal of Therapy:  Heparin level 0.3-0.7 units/ml aPTT 66-102 seconds Monitor platelets by anticoagulation protocol: Yes   Plan:  Decrease heparin to 900 units/hr Check heparin level and aPTT in 6 hours Thanks for allowing pharmacy to be a part of this patient's care.  Excell Seltzer, PharmD Clinical Pharmacist, [phone] 04/08/2014 8:36 AM

## 2014-04-08 NOTE — Progress Notes (Signed)
Patient is alert and oriented. He has stated that he wants to eat and drink. He doesn't want a PEG tube. He has had sips of water this shift, and he has tolerated it well. Will get a meal tray for supper.

## 2014-04-08 NOTE — Progress Notes (Signed)
Physical Therapy Treatment Patient Details Name: Eduardo Deleon MRN: [id number] DOB: [birth year] Today's Date: 04/08/2014    History of Present Illness 80 yo M with PMH of CVA, Afib, CHF, IBS, GERD, and torticollis presented to ED 10/5 via EMS with neck pain that radiates to his jaw, emergently intubated by anesthesia due to respiratory failure following administration of pain medication. 10/7  Extubated in OR with ENT.    PT Comments    Pt progressing towards physical therapy goals. Pt continues to be limited with mobility due to increased HR with activity. Pt able to tolerate transfer to chair with HR at 167bpm at the highest. Pt generally enthusiastic about sitting in the chair and demonstrated a good rehab effort. Discussed safety with back to bed with sitter. Will continue to follow.   Follow Up Recommendations  SNF (Pt refusing SNF or HHPT)     Equipment Recommendations  None recommended by PT    Recommendations for Other Services       Precautions / Restrictions Precautions Precautions: Fall Restrictions Weight Bearing Restrictions: No    Mobility  Bed Mobility Overal bed mobility: Needs Assistance;+ 2 for safety/equipment Bed Mobility: Supine to Sit     Supine to sit: Mod assist;+2 for safety/equipment     General bed mobility comments: Pt somewhat impulsive with transfers, and required +2 assist for safety. Pt was able to assist with transfer to sitting EOB (+2 for safety). VC's required for pt to remain seated until therapist ready for transfer to chair.   Transfers Overall transfer level: Needs assistance Equipment used: Rolling walker (2 wheeled) Transfers: Sit to/from [facility] Sit to Stand: Min assist;+2 physical assistance;+2 safety/equipment Stand pivot transfers: Min assist;+2 physical assistance;+2 safety/equipment       General transfer comment: Pt impulsive with initiating sit>stand. HR increased to 167 with stand and ambulation  was deferred - SPT to recliner only.  Ambulation/Gait                 Stairs            Wheelchair Mobility    Modified Rankin (Stroke Patients Only)       Balance Overall balance assessment: Needs assistance Sitting-balance support: Feet supported;No upper extremity supported Sitting balance-Leahy Scale: Poor Sitting balance - Comments: Required assist of UE's for static sitting. Postural control: Posterior lean Standing balance support: Bilateral upper extremity supported;During functional activity Standing balance-Leahy Scale: Poor                      Cognition Arousal/Alertness: Awake/alert Behavior During Therapy: WFL for tasks assessed/performed Overall Cognitive Status: Within Functional Limits for tasks assessed                      Exercises General Exercises - Lower Extremity Ankle Circles/Pumps: 10 reps Short Arc Quad: 10 reps Long Arc Quad: 10 reps    General Comments        Pertinent Vitals/Pain Pain Assessment: No/denies pain    Home Living                      Prior Function            PT Goals (current goals can now be found in the care plan section) Acute Rehab PT Goals Patient Stated Goal: not stated PT Goal Formulation: With patient Time For Goal Achievement: 04/09/14 Potential to Achieve Goals: Good Progress towards PT goals: Progressing toward goals  Frequency  Min 3X/week    PT Plan Current plan remains appropriate    Co-evaluation             End of Session Equipment Utilized During Treatment: Gait belt;Oxygen Activity Tolerance: Treatment limited secondary to medical complications (Comment) Patient left: with nursing/sitter in room;in chair;with call bell/phone within reach     Time: 1130-1155 PT Time Calculation (min): 25 min  Charges:  $Gait Training: 8-22 mins $Therapeutic Exercise: 8-22 mins                    G Codes:      Rolinda Roan May 01, 2014, 1:17  PM  Rolinda Roan, PT, DPT Acute Rehabilitation Services Pager: [phone]

## 2014-04-09 LAB — CBC
HCT: 36.8 % — ABNORMAL LOW (ref 39.0–52.0)
Hemoglobin: 12.2 g/dL — ABNORMAL LOW (ref 13.0–17.0)
MCH: 29.4 pg (ref 26.0–34.0)
MCHC: 33.2 g/dL (ref 30.0–36.0)
MCV: 88.7 fL (ref 78.0–100.0)
PLATELETS: 153 10*3/uL (ref 150–400)
RBC: 4.15 MIL/uL — AB (ref 4.22–5.81)
RDW: 14.3 % (ref 11.5–15.5)
WBC: 4.8 10*3/uL (ref 4.0–10.5)

## 2014-04-09 LAB — HEPARIN LEVEL (UNFRACTIONATED): Heparin Unfractionated: 0.62 IU/mL (ref 0.30–0.70)

## 2014-04-09 LAB — MAGNESIUM: MAGNESIUM: 2 mg/dL (ref 1.5–2.5)

## 2014-04-09 MED ORDER — APIXABAN 2.5 MG PO TABS
2.5000 mg | ORAL_TABLET | Freq: Two times a day (BID) | ORAL | Status: DC
  Administered 2014-04-09 – 2014-04-13 (×9): 2.5 mg via ORAL
  Filled 2014-04-09 (×10): qty 1

## 2014-04-09 MED ORDER — POTASSIUM CHLORIDE 20 MEQ/15ML (10%) PO LIQD
40.0000 meq | Freq: Two times a day (BID) | ORAL | Status: AC
  Administered 2014-04-09 (×2): 40 meq via ORAL
  Filled 2014-04-09 (×2): qty 30

## 2014-04-09 NOTE — Progress Notes (Addendum)
Occupational Therapy Treatment Patient Details Name: Eduardo Deleon MRN: [id number] DOB: 09-24-1924 Today's Date: 04/09/2014    History of present illness 80 yo M with PMH of CVA, Afib, CHF, IBS, GERD, and torticollis presented to ED 10/5 via EMS with neck pain that radiates to his jaw, emergently intubated by anesthesia due to respiratory failure following administration of pain medication. 10/7  Extubated in OR with ENT.   OT comments  Pt. Still requiring assistance for bed mobility and positioning to allow for increased independence with self feeding.  Requires cues for clearing mouth with sips of beverage after bites secondary to noted coughing.  Pt. Able to hold utensils for self feeding but requires cues for initiation and often displays difficulty getting food on the utensil.  Reviewed adaptive options with pt. He states he thinks he's "doing just fine"  Will discuss with evaluating therapist possible need for adaptive utensils and further goal assessment in this area.  Follow Up Recommendations  SNF    Equipment Recommendations  None recommended by OT    Recommendations for Other Services      Precautions / Restrictions Precautions Precautions: Fall       Mobility Bed Mobility Overal bed mobility: Needs Assistance   Rolling: Mod assist;Max assist         General bed mobility comments: difficulty initiating rolling but was able to maintain sidelying for peri hygiene care and linen change after condom catheter leaking issues  Transfers                      Balance                                   ADL Overall ADL's : Needs assistance/impaired Eating/Feeding: Set up;Minimal assistance;Bed level;Cueing for compensatory techinques Eating/Feeding Details (indicate cue type and reason): pt. eats very slow.  noted difficulty using spoon and/or fork to pierce pieces of food or scoop food.  pt. reports he ahs not preivously used adaptive utensils  but may benefit.  noted coughing after each bite. encouraged sips and also to check L cheek as it is a high food packing and trapping risk secondary to torticollis         Lower Body Bathing: Maximal assistance;Bed level   Upper Body Dressing : Minimal assistance;Bed level                     General ADL Comments: max a for LB bathing and linen change after condom catheter issues.  moaning with bed mobility and required max a for rolling l/r      Vision                     Perception     Praxis      Cognition                             Extremity/Trunk Assessment               Exercises     Shoulder Instructions       General Comments      Pertinent Vitals/ Pain       Pain Assessment: No/denies pain  Home Living  Prior Functioning/Environment              Frequency Min 2X/week     Progress Toward Goals  OT Goals(current goals can now be found in the care plan section)  Progress towards OT goals: Progressing toward goals     Plan Discharge plan remains appropriate    Co-evaluation                 End of Session Equipment Utilized During Treatment: Oxygen   Activity Tolerance Patient tolerated treatment well   Patient Left in bed;with call bell/phone within reach;with nursing/sitter in room   Nurse Communication          Time: 8887-5797 OT Time Calculation (min): 26 min  Charges: OT General Charges $OT Visit: 1 Procedure OT Treatments $Self Care/Home Management : 23-37 mins  Janice Coffin, COTA/L 04/09/2014, 1:50 PM

## 2014-04-09 NOTE — Progress Notes (Signed)
Paient had a coughing spell while taking morning meds. O2 sats remain at 98% while on oxygen, but the coughing continues.

## 2014-04-09 NOTE — Progress Notes (Signed)
[facility] PROGRESS NOTE Interim History: 80 yo M with PMH of CVA, Afib, CHF, IBS, GERD, and torticollis presented to ED 10/5 via EMS with neck pain that radiates to his jaw, emergently intubated by anesthesia due to respiratory failure following administration of pain medication.    Assessment/Plan: Acute respiratory failure - Suspect this was largely anatomical in setting of sedation and neck pain. His epiglottis and cords were normal on direct laryngoscopy 10/7. - High risk  Of aspiration.He would like to move toward comfort care. - He determine I am not a provider. - Consult psyq for capacity.  Chronic a-fib: - At home on Cardizem 180 mg QD; however, unable to take PO's as failed FEES 10/12.  - PRN Lopressor to maintain HR < 115.  - Hold Eliquis while NPO.  - Heparin for now until can resume PO meds   CRI (chronic renal insufficiency) - Creatinine at baseline.    Code Status: DNR Family Communication: none  Disposition Plan: inpatient   Consultants:  PCCM   ENT  Procedures: 10/5 CT head and neck - B pleural effusions, R>L, severe torticollis, no mention of abscess, difficult exam due to anatomy  10/5 CXR L medial basilar consolidation vs. atelectasis. Pulmonary edema.  10/6 CXR LLL atelectasis and/or infiltrate. Pulmonary edema clearing.  10/7 CXR Bibasilar subsegmental atelectasis. No pulmonary edema. Stable cardiomegaly.  10/8 Swallow eval - D1 diet, pudding thick liquids  10/10 SLP eval. Rec NPO for now. Plan to f/u with further puree trials. Likely will need FEES 10/12  10/12 FEES >>> failed   Antibiotics:  No   HPI/Subjective: Pt in a different mood today. Did not want to speak  Objective: Filed Vitals:   04/08/14 2021 04/09/14 0155 04/09/14 0517 04/09/14 1049  BP: 137/92 135/93 113/81 128/92  Pulse: 127 106 89 122  Temp: 97 F (36.1 C)  97.3 F (36.3 C) 97.9 F (36.6 C)  TempSrc: Axillary  Oral Oral  Resp: 16  16   Height:        Weight:   52.3 kg (115 lb 4.8 oz)   SpO2: 99% 99% 99% 98%    Intake/Output Summary (Last 24 hours) at 04/09/14 1246 Last data filed at 04/09/14 1238  Gross per 24 hour  Intake    636 ml  Output    525 ml  Net    111 ml   Filed Weights   04/07/14 0622 04/08/14 0516 04/09/14 0517  Weight: 53.071 kg (117 lb) 50.5 kg (111 lb 5.3 oz) 52.3 kg (115 lb 4.8 oz)    Exam:  General: , in no acute distress.  HEENT: Severe torticollis with clockwise rotation and rightward deviation Heart: irregular rate and rhythm. Lungs: Good air movement, clear Abdomen: Soft, nontender, nondistended, positive bowel sounds.    Data Reviewed: Basic Metabolic Panel:  Recent Labs Lab 04/03/14 0336 04/06/14 0424 04/07/14 1427 04/08/14 0648 04/09/14 0332  NA 139 148* 151* 152*  --   K 3.8 3.3* 3.3* 3.2*  --   CL 102 104 109 109  --   CO2 24 25 24 24   --   GLUCOSE 103* 97 88 93  --   BUN 20 22 19 21   --   CREATININE 0.92 0.86 0.87 0.84  --   CALCIUM 8.1* 8.7 8.6 8.7  --   MG  --   --   --   --  2.0   Liver Function Tests: No results found for this basename: AST, ALT, ALKPHOS,  BILITOT, PROT, ALBUMIN,  in the last 168 hours No results found for this basename: LIPASE, AMYLASE,  in the last 168 hours No results found for this basename: AMMONIA,  in the last 168 hours CBC:  Recent Labs Lab 04/03/14 0336 04/06/14 0424 04/07/14 0536 04/09/14 0420  WBC 4.5 5.7 5.1 4.8  HGB 11.8* 12.7* 12.1* 12.2*  HCT 35.3* 38.0* 36.6* 36.8*  MCV 87.6 87.4 88.2 88.7  PLT 121* 168 163 153   Cardiac Enzymes: No results found for this basename: CKTOTAL, CKMB, CKMBINDEX, TROPONINI,  in the last 168 hours BNP (last 3 results)  Recent Labs  10/09/13 1850  PROBNP 2371.0*   CBG:  Recent Labs Lab 04/07/14 2155 04/08/14 0224 04/08/14 0515 04/08/14 0830 04/08/14 1100  GLUCAP 88 96 97 101* 92    No results found for this or any previous visit (from the past 240 hour(s)).   Studies: No results  found.  Scheduled Meds: . apixaban  2.5 mg Oral BID  . chlorhexidine  15 mL Mouth Rinse BID  . cholecalciferol  2,000 Units Oral Daily  . diltiazem  180 mg Oral Daily  . doxazosin  2 mg Oral QHS  . furosemide  20 mg Oral Daily  . magnesium oxide  400 mg Oral Daily  . pantoprazole (PROTONIX) IV  40 mg Intravenous Q24H  . pneumococcal 23 valent vaccine  0.5 mL Intramuscular Tomorrow-1000   Continuous Infusions:     Charlynne Cousins  [facility] Pager [phone]. If 8PM-8AM, please contact night-coverage at [email or web address], password [facility] 04/09/2014, 12:46 PM  LOS: 11 days

## 2014-04-09 NOTE — Progress Notes (Signed)
ANTICOAGULATION CONSULT NOTE - Follow Up Consult  Pharmacy Consult for Heparin  Indication: atrial fibrillation  No Known Allergies  Patient Measurements: Height: 5\' 7"  (170.2 cm) Weight: 111 lb 5.3 oz (50.5 kg) IBW/kg (Calculated) : 66.1   Vital Signs: Temp: 97 F (36.1 C) (10/15 2021) Temp Source: Axillary (10/15 2021) BP: 137/92 mmHg (10/15 2021) Pulse Rate: 127 (10/15 2021)  Labs:  Recent Labs  04/06/14 0424  04/07/14 0536 04/07/14 1427 04/08/14 0648 04/08/14 1442 04/09/14 0015  HGB 12.7*  --  12.1*  --   --   --   --   HCT 38.0*  --  36.6*  --   --   --   --   PLT 168  --  163  --   --   --   --   APTT  --   < > 60* 93* 118* 99*  --   HEPARINUNFRC  --   < > 0.78* 0.96* 0.94* 0.82* 0.62  CREATININE 0.86  --   --  0.87 0.84  --   --   < > = values in this interval not displayed.  Estimated Creatinine Clearance: 44.3 ml/min (by C-G formula based on Cr of 0.84).   Assessment: Heparin while apixaban on hold (aPTT and HL appeared to be correlating so now back to using HL only). HL after rate decrease is 0.62, other labs as above.   Goal of Therapy:  Heparin level 0.3-0.7 units/ml Monitor platelets by anticoagulation protocol: Yes   Plan:  -Continue heparin at 800 units/hr -0900 HL to confirm -Daily CBC/HL -Monitor for bleeding  Narda Bonds 04/09/2014,1:46 AM

## 2014-04-09 NOTE — Progress Notes (Signed)
Patient [name]      DOB: July 16, 1924      JFH:[id number]   Palliative Medicine Team at [facility] Progress Note    Subjective: Mentally not as clear today. Does not remember seeing me yesterday.  Talks about Dr's debating surgical procedures for intestines. No acute complaints such as pain, n/v.     Filed Vitals:   04/09/14 1049  BP: 128/92  Pulse: 122  Temp: 97.9 F (36.6 C)  Resp:    Physical exam: GEN: alert, NAD,  HEENT: [place], sclera anicteric  Skin: warm/dry Neuro: more confused today    Assessment and plan: 80 yo male with PMHx of CVA, afib, torticollis who presented with neck/jaw pain required intubation after pain med administration with hypoxia. Continued confusion and aspiration concerns here.  1. Code Status: DNR   2. Goals of Care:  See previous consultation note. More confused today. Refusing placement options which he confirms today but again his mentation is not as clear. Talks about physicians discussing surgical interventions for intestines. Does not remember me or conversations surrounding PEG/dysphagia. Dr Aileen Fass has consulted psych for capacity.  Dispo will be challenge unless he is deemed to not have capacity to make medical decisions.   I will be available over weekend but plan on following up on Monday unless more urgent issues arise.  3. Symptom Management:  1. Dysphagia- severe on FEES. Had passed swallow eval on 18-Apr-2023 but failed 10/10. No PEG. On comfort feeds.  2. Confusion-worse today. Would see if he can get hearing aids from home.  Try to ensure good sleep/wake cycles.   4. Psychosocial/Spiritual: Lives at home in [place] with assistance from occasional home health aides. HCPOA is godson [place] who lives in [place]. He never had any children of his own.    Doran Clay D.O. Palliative Medicine Team at [facility]  Pager: [phone] Team Phone: [phone]

## 2014-04-09 NOTE — Progress Notes (Signed)
Speech Language Pathology Treatment: Dysphagia  Patient Details Name: Eduardo Deleon MRN: [id number] DOB: December 05, 1924 Today's Date: 04/09/2014 Time: 1600-1610 SLP Time Calculation (min): 10 min  Assessment / Plan / Recommendation Clinical Impression  F/u for dysphagia: Palliative care following; pt is now on comfort feeds.  Pleasant today, but remains confused.  He did express great joy from being able to drink coffee.  Posted basic precautions over bed: Position as upright as possible; small sips;  allow pt to feed himself with set-up - pt paces himself to avoid pain in left throat.  + signs of aspiration observed today upon drinking liquids, but pt did not appear uncomfortable.  SLP role no longer warranted - we will sign off.     HPI HPI: 80 yo M with PMH of CVA (1978), Afib, CHF, IBS, GERD, and torticollis presented to ED 10/5 via EMS with neck pain that radiates to his jaw, emergently intubated by anesthesia due to respiratory failure following administration of pain medication. OETT 7 10/5>>>10/7.  Extubated per ENT under direct laryngoscopy.  Noted with severe torticollis. Basically normal larynx with bilateral mobile vocal cords.  Continues with NG today.   Pt describes hx of "trouble swallowing" that worsens when he becomes ill.  Pt had clinical swallow evaluation on 10/8, which revealed s/s worrisome for aspiration.  Dys 1 diet started; NPO over w/e due to increased difficulty.   FEES showed severe dysphagia; pt being followed by Palliative Care; now comfort feeds.     Pertinent Vitals Pain Assessment: No/denies pain  SLP Plan  Discharge SLP treatment due to (comfort feeds )    Recommendations Compensations: Slow rate;Small sips/bites Postural Changes and/or Swallow Maneuvers:  (as upright as possible)              Plan: Discharge SLP treatment due to (comfort feeds )   Ercel Normoyle L. Tivis Ringer, [place] CCC/SLP Pager [phone]      Eduardo Deleon 04/09/2014, 4:17 PM

## 2014-04-09 NOTE — Progress Notes (Signed)
Notified hospitalist of potassium of 3.2. Orders given for liquid potassium, patient on dysphagia diet. Will continue to monitor patient to end of shift.

## 2014-04-10 DIAGNOSIS — F419 Anxiety disorder, unspecified: Secondary | ICD-10-CM

## 2014-04-10 MED ORDER — PANTOPRAZOLE SODIUM 40 MG PO TBEC
40.0000 mg | DELAYED_RELEASE_TABLET | Freq: Every day | ORAL | Status: DC
  Administered 2014-04-11: 40 mg via ORAL

## 2014-04-10 NOTE — Progress Notes (Signed)
[facility] PROGRESS NOTE Interim History: 80 yo M with PMH of CVA, Afib, CHF, IBS, GERD, and torticollis presented to ED 10/5 via EMS with neck pain that radiates to his jaw, emergently intubated by anesthesia due to respiratory failure following administration of pain medication.    Assessment/Plan: Acute respiratory failure - Suspect this was largely anatomical in setting of sedation and neck pain. His epiglottis and cords were normal on direct laryngoscopy 10/7. - High risk  Of aspiration. Pt now undecided about what to do. - Awaitting psyq for capacity.  Chronic a-fib: - At home on Cardizem 180 mg QD; however, unable to take PO's as failed FEES 10/12.  - PRN Lopressor to maintain HR < 115.  - Hold Eliquis while NPO.  - Heparin for now until can resume PO meds   CRI (chronic renal insufficiency) - Creatinine at baseline.    Code Status: DNR Family Communication: none  Disposition Plan: inpatient   Consultants:  PCCM   ENT  Procedures: 10/5 CT head and neck - B pleural effusions, R>L, severe torticollis, no mention of abscess, difficult exam due to anatomy  10/5 CXR L medial basilar consolidation vs. atelectasis. Pulmonary edema.  10/6 CXR LLL atelectasis and/or infiltrate. Pulmonary edema clearing.  10/7 CXR Bibasilar subsegmental atelectasis. No pulmonary edema. Stable cardiomegaly.  10/8 Swallow eval - D1 diet, pudding thick liquids  10/10 SLP eval. Rec NPO for now. Plan to f/u with further puree trials. Likely will need FEES 10/12  10/12 FEES >>> failed   Antibiotics:  No   HPI/Subjective: Pleasant, he is undecided about what to do  Objective: Filed Vitals:   04/09/14 1049 04/09/14 1500 04/09/14 2042 04/10/14 0611  BP: 128/92 112/65 121/76 130/99  Pulse: 122 89 105   Temp: 97.9 F (36.6 C) 98 F (36.7 C) 98.1 F (36.7 C)   TempSrc: Oral  Oral   Resp:  18 17   Height:      Weight:    54.5 kg (120 lb 2.4 oz)  SpO2: 98% 95% 95%      Intake/Output Summary (Last 24 hours) at 04/10/14 0958 Last data filed at 04/10/14 0928  Gross per 24 hour  Intake    960 ml  Output    800 ml  Net    160 ml   Filed Weights   04/08/14 0516 04/09/14 0517 04/10/14 0611  Weight: 50.5 kg (111 lb 5.3 oz) 52.3 kg (115 lb 4.8 oz) 54.5 kg (120 lb 2.4 oz)    Exam:  General: , in no acute distress.  HEENT: Severe torticollis with clockwise rotation and rightward deviation Heart: irregular rate and rhythm. Lungs: Good air movement, clear Abdomen: Soft, nontender, nondistended, positive bowel sounds.    Data Reviewed: Basic Metabolic Panel:  Recent Labs Lab 04/06/14 0424 04/07/14 1427 04/08/14 0648 04/09/14 0332  NA 148* 151* 152*  --   K 3.3* 3.3* 3.2*  --   CL 104 109 109  --   CO2 25 24 24   --   GLUCOSE 97 88 93  --   BUN 22 19 21   --   CREATININE 0.86 0.87 0.84  --   CALCIUM 8.7 8.6 8.7  --   MG  --   --   --  2.0   Liver Function Tests: No results found for this basename: AST, ALT, ALKPHOS, BILITOT, PROT, ALBUMIN,  in the last 168 hours No results found for this basename: LIPASE, AMYLASE,  in the last 168 hours  No results found for this basename: AMMONIA,  in the last 168 hours CBC:  Recent Labs Lab 04/06/14 0424 04/07/14 0536 04/09/14 0420  WBC 5.7 5.1 4.8  HGB 12.7* 12.1* 12.2*  HCT 38.0* 36.6* 36.8*  MCV 87.4 88.2 88.7  PLT 168 163 153   Cardiac Enzymes: No results found for this basename: CKTOTAL, CKMB, CKMBINDEX, TROPONINI,  in the last 168 hours BNP (last 3 results)  Recent Labs  10/09/13 1850  PROBNP 2371.0*   CBG:  Recent Labs Lab 04/07/14 2155 04/08/14 0224 04/08/14 0515 04/08/14 0830 04/08/14 1100  GLUCAP 88 96 97 101* 92    No results found for this or any previous visit (from the past 240 hour(s)).   Studies: No results found.  Scheduled Meds: . apixaban  2.5 mg Oral BID  . chlorhexidine  15 mL Mouth Rinse BID  . cholecalciferol  2,000 Units Oral Daily  . diltiazem   180 mg Oral Daily  . doxazosin  2 mg Oral QHS  . furosemide  20 mg Oral Daily  . magnesium oxide  400 mg Oral Daily  . pantoprazole (PROTONIX) IV  40 mg Intravenous Q24H  . pneumococcal 23 valent vaccine  0.5 mL Intramuscular Tomorrow-1000   Continuous Infusions:     Charlynne Cousins  [facility] Pager [phone]. If 8PM-8AM, please contact night-coverage at [email or web address], password [facility] 04/10/2014, 9:58 AM  LOS: 12 days

## 2014-04-10 NOTE — Consult Note (Addendum)
[facility] Face-to-Face Psychiatry Consult   Reason for Consult:  Help establish capacity to refuse disposition recommendation Referring Physician:  Dr. Thereasa Parkin is an 80 y.o. male. Total Time spent with patient: 30 minutes  Assessment: AXIS I:  Anxiety related to medical illness  AXIS II:  Deferred AXIS III:   Past Medical History  Diagnosis Date  . 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 Atrial fibrillation     chronic anticaog - we[place]raub  . Congestive heart failure   . Anemia   . Osteoarthritis   . Splenic lesion   . Venous stasis dermatitis     hx venous ulcer/cellulitis 06/2011 R and 04/2011 L  . Prostate cancer   . Anal fissure     Hx of  . Internal hemorrhoids   . Diverticulosis 04/12/1998    Left colon--Flex Sig  . CRI (chronic renal insufficiency)   . GERD (gastroesophageal reflux disease)   . PVD (peripheral vascular disease)   . Torticollis, acquired   . Tubular adenoma    AXIS IV:   Worsening physical health, hypoacusia, torticollis limiting ability to function at premorbid levels. Limited support system AXIS V:  51-60 moderate symptoms  Plan:  No evidence of imminent risk to self or others at present.    Subjective:   Eduardo Deleon is a 80 y.o. male patient admitted with pain and respiratory failure .  HPI:  This is an 80 year old man, admitted  Due to neck pain and needed emergent intubation due to respiratory failure following pain medication administration. Extubated a few days ago.  Patient has severe torticollis, which is disfiguring and significantly limits ability to function. He has a history of aspirating easily, choking .  Patient has been living alone , and as per chart notes, it is felt that he would be unable to function or maintain safety if he returned home.  Patient has reportedly refused recommended option, which is to go to SNF after discharge. I have discussed case with Nursing Staff- patient 's closest relative/ power of attorney  lives out of state . Patient has severe hypoacusia, but can hear if one speaks loudly into his L ear. He speaks slowly. At this time  Patient states he is agreeing to going to a SNF " or whatever the Doctor considers to be the safest  For me".  He states " I would rather go home, but I guess I really don't have a choice" He states he realizes it would be unsafe for him to go home . Of note, denies depression, describes some mild anxiety, denies any psychotic symptoms HPI Elements:    Mild anxiety, in the context of deteriorating physical health  Past Psychiatric History: Past Medical History  Diagnosis Date  . 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 Atrial fibrillation     chronic anticaog - we[place]raub  . Congestive heart failure   . Anemia   . Osteoarthritis   . Splenic lesion   . Venous stasis dermatitis     hx venous ulcer/cellulitis 06/2011 R and 04/2011 L  . Prostate cancer   . Anal fissure     Hx of  . Internal hemorrhoids   . Diverticulosis 04/12/1998    Left colon--Flex Sig  . CRI (chronic renal insufficiency)   . GERD (gastroesophageal reflux disease)   . PVD (peripheral vascular disease)   . Torticollis, acquired   . Tubular adenoma     reports that he has never smoked. He  has never used smokeless tobacco. He reports that he does not drink alcohol or use illicit drugs. Family History  Problem Relation Age of Onset  . Heart disease Father   . Breast cancer Mother   . Diabetes Brother   . Diabetes      grandmother  . Colon cancer Neg Hx      Living Arrangements: Alone   Abuse/Neglect [facility]) Physical Abuse: Denies Verbal Abuse: Denies Sexual Abuse: Denies Allergies:  No Known Allergies   Objective: Blood pressure 75/48, pulse 113, temperature 97.5 F (36.4 C), temperature source Oral, resp. rate 16, height 5' 7"  (1.702 m), weight 54.5 kg (120 lb 2.4 oz), SpO2 95.00%.Body mass index is 18.81 kg/(m^2). Results for orders placed during the hospital encounter of 03/29/14  (from the past 72 hour(s))  GLUCOSE, CAPILLARY     Status: None   Collection Time    04/07/14  9:55 PM      Result Value Ref Range   Glucose-Capillary 88  70 - 99 mg/dL  GLUCOSE, CAPILLARY     Status: None   Collection Time    04/08/14  2:24 AM      Result Value Ref Range   Glucose-Capillary 96  70 - 99 mg/dL  GLUCOSE, CAPILLARY     Status: None   Collection Time    04/08/14  5:15 AM      Result Value Ref Range   Glucose-Capillary 97  70 - 99 mg/dL   Comment 1 Notify RN    BASIC METABOLIC PANEL     Status: Abnormal   Collection Time    04/08/14  6:48 AM      Result Value Ref Range   Sodium 152 (*) 137 - 147 mEq/L   Potassium 3.2 (*) 3.7 - 5.3 mEq/L   Chloride 109  96 - 112 mEq/L   CO2 24  19 - 32 mEq/L   Glucose, Bld 93  70 - 99 mg/dL   BUN 21  6 - 23 mg/dL   Creatinine, Ser 0.84  0.50 - 1.35 mg/dL   Calcium 8.7  8.4 - 10.5 mg/dL   GFR calc non Af Amer 76 (*) >90 mL/min   GFR calc Af Amer 89 (*) >90 mL/min   Comment: (NOTE)     The eGFR has been calculated using the CKD EPI equation.     This calculation has not been validated in all clinical situations.     eGFR's persistently <90 mL/min signify possible Chronic Kidney     Disease.   Anion gap 19 (*) 5 - 15  APTT     Status: Abnormal   Collection Time    04/08/14  6:48 AM      Result Value Ref Range   aPTT 118 (*) 24 - 37 seconds   Comment:            IF BASELINE aPTT IS ELEVATED,     SUGGEST PATIENT RISK ASSESSMENT     BE USED TO DETERMINE APPROPRIATE     ANTICOAGULANT THERAPY.  HEPARIN LEVEL (UNFRACTIONATED)     Status: Abnormal   Collection Time    04/08/14  6:48 AM      Result Value Ref Range   Heparin Unfractionated 0.94 (*) 0.30 - 0.70 IU/mL   Comment:            IF HEPARIN RESULTS ARE BELOW     EXPECTED VALUES, AND PATIENT     DOSAGE HAS BEEN CONFIRMED,  SUGGEST FOLLOW UP TESTING     OF ANTITHROMBIN III LEVELS.  GLUCOSE, CAPILLARY     Status: Abnormal   Collection Time    04/08/14  8:30 AM       Result Value Ref Range   Glucose-Capillary 101 (*) 70 - 99 mg/dL  GLUCOSE, CAPILLARY     Status: None   Collection Time    04/08/14 11:00 AM      Result Value Ref Range   Glucose-Capillary 92  70 - 99 mg/dL   Comment 1 Notify RN    HEPARIN LEVEL (UNFRACTIONATED)     Status: Abnormal   Collection Time    04/08/14  2:42 PM      Result Value Ref Range   Heparin Unfractionated 0.82 (*) 0.30 - 0.70 IU/mL   Comment:            IF HEPARIN RESULTS ARE BELOW     EXPECTED VALUES, AND PATIENT     DOSAGE HAS BEEN CONFIRMED,     SUGGEST FOLLOW UP TESTING     OF ANTITHROMBIN III LEVELS.  APTT     Status: Abnormal   Collection Time    04/08/14  2:42 PM      Result Value Ref Range   aPTT 99 (*) 24 - 37 seconds   Comment:            IF BASELINE aPTT IS ELEVATED,     SUGGEST PATIENT RISK ASSESSMENT     BE USED TO DETERMINE APPROPRIATE     ANTICOAGULANT THERAPY.  HEPARIN LEVEL (UNFRACTIONATED)     Status: None   Collection Time    04/09/14 12:15 AM      Result Value Ref Range   Heparin Unfractionated 0.62  0.30 - 0.70 IU/mL   Comment:            IF HEPARIN RESULTS ARE BELOW     EXPECTED VALUES, AND PATIENT     DOSAGE HAS BEEN CONFIRMED,     SUGGEST FOLLOW UP TESTING     OF ANTITHROMBIN III LEVELS.  MAGNESIUM     Status: None   Collection Time    04/09/14  3:32 AM      Result Value Ref Range   Magnesium 2.0  1.5 - 2.5 mg/dL  CBC     Status: Abnormal   Collection Time    04/09/14  4:20 AM      Result Value Ref Range   WBC 4.8  4.0 - 10.5 K/uL   RBC 4.15 (*) 4.22 - 5.81 MIL/uL   Hemoglobin 12.2 (*) 13.0 - 17.0 g/dL   HCT 36.8 (*) 39.0 - 52.0 %   MCV 88.7  78.0 - 100.0 fL   MCH 29.4  26.0 - 34.0 pg   MCHC 33.2  30.0 - 36.0 g/dL   RDW 14.3  11.5 - 15.5 %   Platelets 153  150 - 400 K/uL   Labs are reviewed and are pertinent for  Hypernatremia / hypokalemia.  Current Facility-Administered Medications  Medication Dose Route Frequency Provider Last Rate Last Dose  . apixaban  (ELIQUIS) tablet 2.5 mg  2.5 mg Oral BID Charlynne Cousins, MD   2.5 mg at 04/10/14 1058  . chlorhexidine (PERIDEX) 0.12 % solution 15 mL  15 mL Mouth Rinse BID Wilhelmina Mcardle, MD   15 mL at 04/10/14 1059  . cholecalciferol (VITAMIN D) tablet 2,000 Units  2,000 Units Oral Daily Donita Brooks, NP   2,000 Units  at 04/10/14 1058  . diltiazem (CARDIZEM CD) 24 hr capsule 180 mg  180 mg Oral Daily Patrecia Pour, MD   180 mg at 04/10/14 1058  . doxazosin (CARDURA) tablet 2 mg  2 mg Oral QHS Donita Brooks, NP   2 mg at 04/10/14 7195  . furosemide (LASIX) tablet 20 mg  20 mg Oral Daily [name], MD   20 mg at 04/10/14 1059  . magnesium oxide (MAG-OX) tablet 400 mg  400 mg Oral Daily Wilhelmina Mcardle, MD   400 mg at 04/10/14 1059  . metoprolol (LOPRESSOR) injection 2.5-5 mg  2.5-5 mg Intravenous Q3H PRN Rahul P Desai, PA-C   5 mg at 04/08/14 1100  . pantoprazole (PROTONIX) EC tablet 40 mg  40 mg Oral Daily Kendra P Hiatt, RPH      . pneumococcal 23 valent vaccine (PNU-IMMUNE) injection 0.5 mL  0.5 mL Intramuscular Tomorrow-1000 Wilhelmina Mcardle, MD      . RESOURCE THICKENUP CLEAR   Oral PRN Collene Gobble, MD        Psychiatric Specialty Exam:     Blood pressure 75/48, pulse 113, temperature 97.5 F (36.4 C), temperature source Oral, resp. rate 16, height 5' 7"  (1.702 m), weight 54.5 kg (120 lb 2.4 oz), SpO2 95.00%.Body mass index is 18.81 kg/(m^2).  General Appearance: Fairly Groomed  Engineer, water::  [place]- maintaining eye contact is difficult for patient due to hypoacusia and severe torticollis  Speech:  Slow  Volume:  Decreased  Mood:  denies depression  Affect:  appropriate, mild anxiety  Thought Process:  Linear  Orientation:  Other:  partially oriented, states " I'm in [facility]" and states " it is November"   Thought Content:  denies hallucinations, no delusions  Suicidal Thoughts:  No- denies any suicidal ideations  Homicidal Thoughts:  No  Memory:  recent fair, remote  grossly intact   Judgement:  Fair  Insight:  Fair  Psychomotor Activity:  Decreased  Concentration:  Good  Recall:  [place] of Knowledge:Good  Language: Fair  Akathisia:  No  Handed:  Right  AIMS (if indicated):     Assets:  Desire for Improvement Resilience  Sleep:      Musculoskeletal: Strength & Muscle Tone: abnormal- severe torticollis  Gait & Station: not examined, Patient leans: N/A    Treatment Plan Recommendation 1. At this time patient is agreeing to go to a SNF , and therefore at this time there is no indication to establish capacity to refuse this option.   2. Would recommend  That staff continue to attempt to communicate with family member/power of attorney ( report from Nursing is that he is out of state)  To discuss recommended options.  3. If patient  refuses disposition recommendation again, please reconsult and we will reassess capacity   4. As anxiety is mild per patient's prescription, and based on current risk of aspiration and history of poor tolerance to analgesic trial, would not start BZD or other potentially sedating medication.      COBOS, Felicita Gage 04/10/2014 5:46 PM

## 2014-04-11 NOTE — Progress Notes (Signed)
[facility] PROGRESS NOTE Interim History: 80 yo M with PMH of CVA, Afib, CHF, IBS, GERD, and torticollis presented to ED 10/5 via EMS with neck pain that radiates to his jaw, emergently intubated by anesthesia due to respiratory failure following administration of pain medication.    Assessment/Plan: Acute respiratory failure - Suspect this was largely anatomical in setting of sedation and neck pain. His epiglottis and cords were normal on direct laryngoscopy 10/7. - High risk  Of aspiration. Pt now undecided about what to do. - Psyq rec no indication to establish capacity at this time.  Chronic a-fib: - At home on Cardizem 180 mg QD; however, unable to take PO's as failed FEES 10/12.  - PRN Lopressor to maintain HR < 115.  - restarted Eliquis.  CRI (chronic renal insufficiency) - Creatinine at baseline.    Code Status: DNR Family Communication: none  Disposition Plan: inpatient   Consultants:  PCCM   ENT  Procedures: 10/5 CT head and neck - B pleural effusions, R>L, severe torticollis, no mention of abscess, difficult exam due to anatomy  10/5 CXR L medial basilar consolidation vs. atelectasis. Pulmonary edema.  10/6 CXR LLL atelectasis and/or infiltrate. Pulmonary edema clearing.  10/7 CXR Bibasilar subsegmental atelectasis. No pulmonary edema. Stable cardiomegaly.  10/8 Swallow eval - D1 diet, pudding thick liquids  10/10 SLP eval. Rec NPO for now. Plan to f/u with further puree trials. Likely will need FEES 10/12  10/12 FEES >>> failed   Antibiotics:  No   HPI/Subjective: Pleasant, needs more time.  Objective: Filed Vitals:   04/10/14 0611 04/10/14 1357 04/10/14 2115 04/11/14 0526  BP: 130/99 75/48 83/58  98/74  Pulse:  113 62 79  Temp:  97.5 F (36.4 C) 97.5 F (36.4 C) 98.2 F (36.8 C)  TempSrc:  Oral Oral Oral  Resp:  16 15 14   Height:      Weight: 54.5 kg (120 lb 2.4 oz)   54.1 kg (119 lb 4.3 oz)  SpO2:  95% 95% 96%    Intake/Output  Summary (Last 24 hours) at 04/11/14 0848 Last data filed at 04/11/14 0526  Gross per 24 hour  Intake    720 ml  Output    300 ml  Net    420 ml   Filed Weights   04/09/14 0517 04/10/14 0611 04/11/14 0526  Weight: 52.3 kg (115 lb 4.8 oz) 54.5 kg (120 lb 2.4 oz) 54.1 kg (119 lb 4.3 oz)    Exam:  General: , in no acute distress.  HEENT: Severe torticollis with clockwise rotation and rightward deviation Heart: irregular rate and rhythm. Lungs: Good air movement, clear Abdomen: Soft, nontender, nondistended, positive bowel sounds.    Data Reviewed: Basic Metabolic Panel:  Recent Labs Lab 04/06/14 0424 04/07/14 1427 04/08/14 0648 04/09/14 0332  NA 148* 151* 152*  --   K 3.3* 3.3* 3.2*  --   CL 104 109 109  --   CO2 25 24 24   --   GLUCOSE 97 88 93  --   BUN 22 19 21   --   CREATININE 0.86 0.87 0.84  --   CALCIUM 8.7 8.6 8.7  --   MG  --   --   --  2.0   Liver Function Tests: No results found for this basename: AST, ALT, ALKPHOS, BILITOT, PROT, ALBUMIN,  in the last 168 hours No results found for this basename: LIPASE, AMYLASE,  in the last 168 hours No results found for this basename:  AMMONIA,  in the last 168 hours CBC:  Recent Labs Lab 04/06/14 0424 04/07/14 0536 04/09/14 0420  WBC 5.7 5.1 4.8  HGB 12.7* 12.1* 12.2*  HCT 38.0* 36.6* 36.8*  MCV 87.4 88.2 88.7  PLT 168 163 153   Cardiac Enzymes: No results found for this basename: CKTOTAL, CKMB, CKMBINDEX, TROPONINI,  in the last 168 hours BNP (last 3 results)  Recent Labs  10/09/13 1850  PROBNP 2371.0*   CBG:  Recent Labs Lab 04/07/14 2155 04/08/14 0224 04/08/14 0515 04/08/14 0830 04/08/14 1100  GLUCAP 88 96 97 101* 92    No results found for this or any previous visit (from the past 240 hour(s)).   Studies: No results found.  Scheduled Meds: . apixaban  2.5 mg Oral BID  . chlorhexidine  15 mL Mouth Rinse BID  . cholecalciferol  2,000 Units Oral Daily  . diltiazem  180 mg Oral Daily  .  doxazosin  2 mg Oral QHS  . furosemide  20 mg Oral Daily  . magnesium oxide  400 mg Oral Daily  . pantoprazole  40 mg Oral Daily  . pneumococcal 23 valent vaccine  0.5 mL Intramuscular Tomorrow-1000   Continuous Infusions:     Charlynne Cousins  [facility] Pager [phone]. If 8PM-8AM, please contact night-coverage at [email or web address], password [facility] 04/11/2014, 8:48 AM  LOS: 13 days

## 2014-04-11 NOTE — Progress Notes (Signed)
Eating with less aspiration this am. Fed himself with no audible congestion. Tolerated meds better

## 2014-04-12 DIAGNOSIS — R451 Restlessness and agitation: Secondary | ICD-10-CM

## 2014-04-12 DIAGNOSIS — K5900 Constipation, unspecified: Secondary | ICD-10-CM

## 2014-04-12 MED ORDER — POLYETHYLENE GLYCOL 3350 17 G PO PACK
17.0000 g | PACK | Freq: Once | ORAL | Status: AC
  Administered 2014-04-12: 17 g via ORAL
  Filled 2014-04-12: qty 1

## 2014-04-12 MED ORDER — POLYETHYLENE GLYCOL 3350 17 G PO PACK
17.0000 g | PACK | Freq: Every day | ORAL | Status: DC | PRN
  Filled 2014-04-12: qty 1

## 2014-04-12 MED ORDER — SENNOSIDES-DOCUSATE SODIUM 8.6-50 MG PO TABS
2.0000 | ORAL_TABLET | Freq: Two times a day (BID) | ORAL | Status: DC
  Administered 2014-04-12 – 2014-04-13 (×3): 2 via ORAL
  Filled 2014-04-12 (×4): qty 2

## 2014-04-12 NOTE — Progress Notes (Signed)
Patient Eduardo Deleon      DOB: [birth year]      GGY:[id number]   Palliative Medicine Team at [facility] Progress Note    Subjective: Agreed to SNF. Feels good today. No complaints. Worked with PT. Denies pain, F/C, n/v. Last BM listed as 10/9    Filed Vitals:   04/12/14 0514  BP: 92/63  Pulse: 102  Temp: 97.4 F (36.3 C)  Resp: 15   Physical exam: GEN: alert, NAD Neck: severe torticollis CV: RRR  Lungs: scattered coarse sounds, symm exp Abd: soft, NT EXT: no edema    Assessment and plan: 80 yo male with PMHx of CVA, afib, torticollis who presented with neck/jaw pain required intubation after pain med administration with hypoxia. Continued confusion and aspiration concerns here.   1. Code Status: DNR   2. Goals of Care:  See previous notes.  Agreeable with SNF now. Talked with POA Vicente Males over phone to discuss care and answer questions regarding prognosis, hospice care, outpatient palliative care, and what to expect from recurrent aspiration.  Recommend when he goes to SNF that outpatient palliative care be engaged.  Goal would be comfort and not re-hospitalization with life-threatening aspiration. Vicente Males hopes he can go to [facility] as that is where Mr Clites's wife currently is.    3. Symptom Management:  1. Dysphagia- severe on FEES. Had passed swallow eval on May 02, 2023 but failed 10/10. No PEG. On comfort feeds.  2. Confusion-improving. Recommend someone bring in hearing aides from home ASAP.  3. Constipation- no BM recorded in days. I will add scheduled doc/senna. Give dose of miralax. miralax daily PRN.   4. Psychosocial/Spiritual: Lives at home in [place] with assistance from occasional home health aides. HCPOA is godson [place] who lives in [place]. He never had any children of his own.   Total Time: 30 minutes  >50% of time spent in counseling and coordination of care regarding above assessment and plan.   Doran Clay D.O. Palliative Medicine  Team at [facility]  Pager: [phone] Team Phone: [phone]

## 2014-04-12 NOTE — Plan of Care (Signed)
Problem: Phase I Progression Outcomes Goal: Tolerating diet Outcome: Not Progressing Aspiration precaution. Failed swallowing tests. On comfort feedings. Closely monitored.

## 2014-04-12 NOTE — Progress Notes (Signed)
[facility] PROGRESS NOTE Interim History: 80 yo M with PMH of CVA, Afib, CHF, IBS, GERD, and torticollis presented to ED 10/5 via EMS with neck pain that radiates to his jaw, emergently intubated by anesthesia due to respiratory failure following administration of pain medication.    Assessment/Plan: Acute respiratory failure - Suspect this was largely anatomical in setting of sedation and neck pain. His epiglottis and cords were normal on direct laryngoscopy 10/7. - High risk  Of aspiration. He know the risk he like to cont eating. - Psyq rec no indication to establish capacity at this time. - he agreed to go to SNF.  Chronic a-fib: - At home on Cardizem 180 mg QD; however, unable to take PO's as failed FEES 10/12.  - PRN Lopressor to maintain HR < 115.  - restarted Eliquis.  CRI (chronic renal insufficiency) - Creatinine at baseline.    Code Status: DNR Family Communication: none  Disposition Plan: inpatient   Consultants:  PCCM   ENT  Procedures: 10/5 CT head and neck - B pleural effusions, R>L, severe torticollis, no mention of abscess, difficult exam due to anatomy  10/5 CXR L medial basilar consolidation vs. atelectasis. Pulmonary edema.  10/6 CXR LLL atelectasis and/or infiltrate. Pulmonary edema clearing.  10/7 CXR Bibasilar subsegmental atelectasis. No pulmonary edema. Stable cardiomegaly.  10/8 Swallow eval - D1 diet, pudding thick liquids  10/10 SLP eval. Rec NPO for now. Plan to f/u with further puree trials. Likely will need FEES 10/12  10/12 FEES >>> failed   Antibiotics:  No   HPI/Subjective: Pleasant, no complains  Objective: Filed Vitals:   04/10/14 2115 04/11/14 0526 04/11/14 1410 04/12/14 0514  BP: 83/58 98/74 74/47  92/63  Pulse: 62 79 112 102  Temp: 97.5 F (36.4 C) 98.2 F (36.8 C) 97.3 F (36.3 C) 97.4 F (36.3 C)  TempSrc: Oral Oral Oral Oral  Resp: 15 14 14 15   Height:      Weight:  54.1 kg (119 lb 4.3 oz)  50.9 kg  (112 lb 3.4 oz)  SpO2: 95% 96% 88% 93%    Intake/Output Summary (Last 24 hours) at 04/12/14 0951 Last data filed at 04/12/14 0517  Gross per 24 hour  Intake    120 ml  Output    475 ml  Net   -355 ml   Filed Weights   04/10/14 0611 04/11/14 0526 04/12/14 0514  Weight: 54.5 kg (120 lb 2.4 oz) 54.1 kg (119 lb 4.3 oz) 50.9 kg (112 lb 3.4 oz)    Exam:  General: , in no acute distress.  HEENT: Severe torticollis with clockwise rotation and rightward deviation Heart: irregular rate and rhythm. Lungs: Good air movement, clear    Data Reviewed: Basic Metabolic Panel:  Recent Labs Lab 04/06/14 0424 04/07/14 1427 04/08/14 0648 04/09/14 0332  NA 148* 151* 152*  --   K 3.3* 3.3* 3.2*  --   CL 104 109 109  --   CO2 25 24 24   --   GLUCOSE 97 88 93  --   BUN 22 19 21   --   CREATININE 0.86 0.87 0.84  --   CALCIUM 8.7 8.6 8.7  --   MG  --   --   --  2.0   Liver Function Tests: No results found for this basename: AST, ALT, ALKPHOS, BILITOT, PROT, ALBUMIN,  in the last 168 hours No results found for this basename: LIPASE, AMYLASE,  in the last 168 hours No results found  for this basename: AMMONIA,  in the last 168 hours CBC:  Recent Labs Lab 04/06/14 0424 04/07/14 0536 04/09/14 0420  WBC 5.7 5.1 4.8  HGB 12.7* 12.1* 12.2*  HCT 38.0* 36.6* 36.8*  MCV 87.4 88.2 88.7  PLT 168 163 153   Cardiac Enzymes: No results found for this basename: CKTOTAL, CKMB, CKMBINDEX, TROPONINI,  in the last 168 hours BNP (last 3 results)  Recent Labs  10/09/13 1850  PROBNP 2371.0*   CBG:  Recent Labs Lab 04/07/14 2155 04/08/14 0224 04/08/14 0515 04/08/14 0830 04/08/14 1100  GLUCAP 88 96 97 101* 92    No results found for this or any previous visit (from the past 240 hour(s)).   Studies: No results found.  Scheduled Meds: . apixaban  2.5 mg Oral BID  . chlorhexidine  15 mL Mouth Rinse BID  . cholecalciferol  2,000 Units Oral Daily  . diltiazem  180 mg Oral Daily  .  doxazosin  2 mg Oral QHS  . furosemide  20 mg Oral Daily  . magnesium oxide  400 mg Oral Daily  . pantoprazole  40 mg Oral Daily  . pneumococcal 23 valent vaccine  0.5 mL Intramuscular Tomorrow-1000   Continuous Infusions:     Charlynne Cousins  [facility] Pager [phone]. If 8PM-8AM, please contact night-coverage at [email or web address], password [facility] 04/12/2014, 9:51 AM  LOS: 14 days

## 2014-04-12 NOTE — Progress Notes (Signed)
Physical Therapy Treatment Patient Details Name: Eduardo Deleon MRN: [id number] DOB: 09-24-1924 Today's Date: 04/12/2014    History of Present Illness 80 yo M with PMH of CVA, Afib, CHF, IBS, GERD, and torticollis presented to ED 10/5 via EMS with neck pain that radiates to his jaw, emergently intubated by anesthesia due to respiratory failure following administration of pain medication. 10/7  Extubated in OR with ENT.    PT Comments    Pt fatigued quickly with SPT.  Pt also limited at times due to [facility].  Follow Up Recommendations  SNF     Equipment Recommendations  None recommended by PT    Recommendations for Other Services       Precautions / Restrictions Precautions Precautions: Fall Restrictions Weight Bearing Restrictions: No    Mobility  Bed Mobility Overal bed mobility: Needs Assistance Bed Mobility: Supine to Sit     Supine to sit: Mod assist        Transfers Overall transfer level: Needs assistance Equipment used: 2 person hand held assist Transfers: Stand Pivot Transfers Sit to Stand: +2 physical assistance;Min assist Stand pivot transfers: Mod assist;+2 physical assistance;+2 safety/equipment       General transfer comment: Transferred to recliner with HHA of 2 with slight R knee buckeling.  Pt had difficulty following directions due to [facility]. Pt would not let go of tech's hand to reach for arm rest.  Ambulation/Gait                 Stairs            Wheelchair Mobility    Modified Rankin (Stroke Patients Only)       Balance                                    Cognition Arousal/Alertness: Awake/alert Behavior During Therapy: WFL for tasks assessed/performed Overall Cognitive Status: Difficult to assess [facility])                      Exercises      General Comments        Pertinent Vitals/Pain Pain Assessment: Faces Faces Pain Scale: No hurt    Home Living                      Prior  Function            PT Goals (current goals can now be found in the care plan section) Acute Rehab PT Goals PT Goal Formulation: With patient Time For Goal Achievement: 04/19/14 Potential to Achieve Goals: Fair Progress towards PT goals: Not progressing toward goals - comment (Fatigued with transfer unable to attempt gait or therex)    Frequency  Min 3X/week    PT Plan Current plan remains appropriate    Co-evaluation             End of Session Equipment Utilized During Treatment: Gait belt;Oxygen Activity Tolerance: Patient limited by fatigue Patient left: with nursing/sitter in room;in chair;with call bell/phone within reach     Time: 0910-0921 PT Time Calculation (min): 11 min  Charges:  $Therapeutic Activity: 8-22 mins                    G Codes:      Manda Holstad LUBECK 04/12/2014, 9:54 AM

## 2014-04-12 NOTE — Progress Notes (Signed)
[facility] MD Progress Note  04/12/2014 5:50 PM Eduardo Deleon  MRN:  [id number] Subjective:  Following for psychiatry: Patient is seen he is in bed, very hard of hearing, complains of wet bed due to discontinuation of his previous GU care...? He states he is hard of hearing and has lost his hearing aid.  Objective: Patient clearly states "they don't care about [place] here, I've asked for help over an hour ago, and someone gave me a spoonful of applesauce and then left! I haven't seen her in over 20 minutes!" He goes on to say, I don't need anything to help me go! I need help getting dry because I have gone!! "  Patient is only interested in getting dry sheets at this point, but thanks me for asking for help for him. Diagnosis:   DSM5: Schizophrenia Disorders:   Obsessive-Compulsive Disorders:   Trauma-Stressor Disorders:   Substance/Addictive Disorders:   Depressive Disorders:   Total Time spent with patient: 15 minutes    ADL's:  Impaired  Sleep: NA  Appetite:  NA  Suicidal Ideation:  UTA Homicidal Ideation:  UTA AEB (as evidenced by):  Psychiatric Specialty Exam: Physical Exam  ROS  Blood pressure 99/55, pulse 92, temperature 97.4 F (36.3 C), temperature source Oral, resp. rate 16, height 5\' 7"  (1.702 m), weight 50.9 kg (112 lb 3.4 oz), SpO2 94.00%.Body mass index is 17.57 kg/(m^2).  General Appearance: Casual  Eye Contact::  Good  Speech:  Normal Rate  Volume:  Increased  Mood:  distressed  Affect:  Congruent  Thought Process:  Goal Directed  Orientation:  Other:  place, person  Thought Content:  WDL  Suicidal Thoughts:  None reported  Homicidal Thoughts:  None reported  Memory:  Immediate;   Fair Recent;   Fair  Judgement:  Impaired  Insight:  Shallow  Psychomotor Activity:  Restlessness  Concentration:  Fair  Recall:  [employer] of Knowledge:NA  Language: Good  Akathisia:  NA  Handed:    AIMS (if indicated):     Assets:  Desire for Improvement  Sleep:       Musculoskeletal: Strength & Muscle Tone: NA Gait & Station: NA Patient leans: NA   Current Medications: Current Facility-Administered Medications  Medication Dose Route Frequency Provider Last Rate Last Dose  . apixaban (ELIQUIS) tablet 2.5 mg  2.5 mg Oral BID Charlynne Cousins, MD   2.5 mg at 04/12/14 1059  . chlorhexidine (PERIDEX) 0.12 % solution 15 mL  15 mL Mouth Rinse BID Wilhelmina Mcardle, MD   15 mL at 04/12/14 4259  . cholecalciferol (VITAMIN D) tablet 2,000 Units  2,000 Units Oral Daily Donita Brooks, NP   2,000 Units at 04/12/14 1059  . diltiazem (CARDIZEM CD) 24 hr capsule 180 mg  180 mg Oral Daily Patrecia Pour, MD   180 mg at 04/12/14 1059  . doxazosin (CARDURA) tablet 2 mg  2 mg Oral QHS Donita Brooks, NP   2 mg at 04/10/14 5638  . furosemide (LASIX) tablet 20 mg  20 mg Oral Daily [name], MD   20 mg at 04/12/14 1059  . magnesium oxide (MAG-OX) tablet 400 mg  400 mg Oral Daily Wilhelmina Mcardle, MD   400 mg at 04/12/14 1059  . metoprolol (LOPRESSOR) injection 2.5-5 mg  2.5-5 mg Intravenous Q3H PRN Rahul P Desai, PA-C   5 mg at 04/08/14 1100  . pantoprazole (PROTONIX) EC tablet 40 mg  40 mg Oral Daily Kendra P  Hiatt, RPH   40 mg at 04/11/14 1002  . pneumococcal 23 valent vaccine (PNU-IMMUNE) injection 0.5 mL  0.5 mL Intramuscular Tomorrow-1000 Wilhelmina Mcardle, MD      . polyethylene glycol (MIRALAX / GLYCOLAX) packet 17 g  17 g Oral Daily PRN Brock Ra Lampkin, DO      . RESOURCE THICKENUP CLEAR   Oral PRN Collene Gobble, MD      . senna-docusate (Senokot-S) tablet 2 tablet  2 tablet Oral BID Brock Ra Lampkin, DO   2 tablet at 04/12/14 1500    Lab Results: No results found for this or any previous visit (from the past 48 hour(s)).  Physical Findings: AIMS:  , ,  ,  ,    CIWA:    COWS:     Treatment Plan Summary: No changes at this time.  Plan: Please continue to consult if he changes his mind about the SNF Medical Decision Making Problem Points:   Established problem, stable/improving (1) Data Points:  Review or order medicine tests (1)  6:03 PM 04/12/2014 Case discussed, patient not willing for SNF, understands the pros and cons of refusing it.

## 2014-04-12 NOTE — Plan of Care (Signed)
Problem: Phase I Progression Outcomes Goal: Progress activity as tolerated unless otherwise ordered Outcome: Not Progressing Pt is very week and not eating well.

## 2014-04-13 DIAGNOSIS — N183 Chronic kidney disease, stage 3 (moderate): Secondary | ICD-10-CM

## 2014-04-13 MED ORDER — PANTOPRAZOLE SODIUM 40 MG PO TBEC: 40.0000 mg | DELAYED_RELEASE_TABLET | Freq: Every day | ORAL | Status: AC

## 2014-04-13 MED ORDER — POLYETHYLENE GLYCOL 3350 17 G PO PACK: 17.0000 g | PACK | Freq: Every day | ORAL | Status: DC | PRN

## 2014-04-13 MED ORDER — DEXTROSE 5 % IV SOLN
INTRAVENOUS | Status: DC
  Administered 2014-04-13: 12:00:00 via INTRAVENOUS

## 2014-04-13 MED ORDER — POTASSIUM CHLORIDE 20 MEQ/15ML (10%) PO LIQD
40.0000 meq | Freq: Every day | ORAL | Status: DC
Start: 2014-04-13 — End: 2014-04-13
  Administered 2014-04-13: 40 meq via ORAL
  Filled 2014-04-13: qty 30

## 2014-04-13 NOTE — Plan of Care (Signed)
Problem: Phase II Progression Outcomes Goal: ADLs completed with minimal assistance Outcome: Not Applicable Date Met:  68/61/04 Patient is total care

## 2014-04-13 NOTE — Plan of Care (Signed)
Problem: Phase II Progression Outcomes Goal: Tolerating diet Outcome: Not Progressing Patient continues to aspirate and semi-tolerates thin liquids. Patient refuses thickner for liquids Goal: Discharge plan remains appropriate-arrangements made Outcome: Progressing Pt has had no sitter for almost 24hrs and may be eligible for discharge sometime tomorrow.

## 2014-04-13 NOTE — Progress Notes (Signed)
NUTRITION FOLLOW UP  Intervention:   Comfort feedings  RD to continue to monitor nutrition plan.   Nutrition Dx:   Inadequate oral intake related to inability to eat as evidenced by NPO status; ongoing- diet advanced for comfort, ongoing poor po intake  Goal:   Pt to meet >/= 90% of their estimated nutrition needs; unmet  Monitor:   Diet advancement/PO intake, TF tolerance/adequacy, weight trend, labs, vent status  Assessment:   80 yo M with PMH of CVA, Afib, CHF, IBS, GERD, and torticollis presented to ED 10/5 via EMS with neck pain that radiates to his jaw, emergently intubated by anesthesia due to respiratory failure following administration of pain medication.   Pt was extubated 10/7 and diet was advanced to Dysphagia 1 with pudding-thick liquids on 10/8. Per nursing notes pt ate 25% to 50% of meals on 10/9. Pt was re-assessed by SLP on 10/10 with recommendations for NPO status; pt later failed FEES on 10/12. Diet was advanced to Dysphagia 3 with thin liquids on 10/15 for comfort. Per nursing notes pt has been eating 25% of some meals. Per chart, patient continues to aspirate and semi-tolerates thin liquids but, refuses thickener for liquids.  Pt states that he is getting full fast and has difficulty swallowing- he does not feel he can eat anymore and he is not interested in receiving any nutritional supplements. Ensure pudding at bedside which patient reports he ate some of with his medications but, he does not want to eat anymore.  RD offered support. Encouraged pt to request return visit of nutrition staff via RN if he changes his mind about ice cream supplements.   Pt's weight has dropped significantly from 147 lbs to 114 lbs.  -6.2 L net I/O since admission  Labs reviewed: Elevated sodium, low potassium  Height: Ht Readings from Last 1 Encounters:  04/01/14 5\' 7"  (1.702 m)    Weight Status:   Wt Readings from Last 1 Encounters:  04/13/14 115 lb 4.8 oz (52.3 kg)   03/30/14 147 lb 02/23/14 136 lb 05/27/13 140 lb  Re-estimated needs:  Kcal: 1600-1800 Protein: 70-80 grams Fluid: 1.6 L/day  Skin: +1 generalized edema; stage 1 pressure ulcer on buttocks  Diet Order: Dysphagia 3, thin liquids   Intake/Output Summary (Last 24 hours) at 04/13/14 1011 Last data filed at 04/13/14 7948  Gross per 24 hour  Intake    220 ml  Output    200 ml  Net     20 ml    Last BM: 10/20   Labs:   Recent Labs Lab 04/07/14 1427 04/08/14 0648 04/09/14 0332  NA 151* 152*  --   K 3.3* 3.2*  --   CL 109 109  --   CO2 24 24  --   BUN 19 21  --   CREATININE 0.87 0.84  --   CALCIUM 8.6 8.7  --   MG  --   --  2.0  GLUCOSE 88 93  --     CBG (last 3)  No results found for this basename: GLUCAP,  in the last 72 hours  Scheduled Meds: . apixaban  2.5 mg Oral BID  . chlorhexidine  15 mL Mouth Rinse BID  . cholecalciferol  2,000 Units Oral Daily  . diltiazem  180 mg Oral Daily  . doxazosin  2 mg Oral QHS  . furosemide  20 mg Oral Daily  . magnesium oxide  400 mg Oral Daily  . pantoprazole  40 mg Oral  Daily  . pneumococcal 23 valent vaccine  0.5 mL Intramuscular Tomorrow-1000  . senna-docusate  2 tablet Oral BID    Continuous Infusions:    Pryor Ochoa RD, LDN Inpatient Clinical Dietitian Pager: [phone] After Hours Pager: [phone]

## 2014-04-13 NOTE — Discharge Summary (Signed)
Physician Discharge Summary  Eduardo Deleon RDE:[id number] DOB: January 04, 1925 DOA: 03/29/2014  PCP: Gwendolyn Grant, MD  Admit date: 03/29/2014 Discharge date: 04/13/2014  Time spent: 35 minutes  Recommendations for Outpatient Follow-up:  1. Follow up with PCP as an outpatient. 2. He decided to be DNR, he would like a PEG. 3. Palliative care to follow at facility.  Discharge Diagnoses:  Principal Problem:   Acute respiratory failure Active Problems:   CRI (chronic renal insufficiency)   Long term current use of anticoagulant therapy   Chronic a-fib   Discharge Condition: guarded  Diet recommendation: regular  Filed Weights   04/11/14 0526 04/12/14 0514 04/13/14 6314  Weight: 54.1 kg (119 lb 4.3 oz) 50.9 kg (112 lb 3.4 oz) 52.3 kg (115 lb 4.8 oz)    History of present illness:  80 yo M with PMH of CVA, Afib, CHF, IBS, GERD, and torticollis presented to ED 10/5 via EMS with neck pain that radiates to his jaw, emergently intubated by anesthesia due to respiratory failure following administration of pain medication.    Hospital Course:  Acute respiratory failure  - Suspect this was largely anatomical in setting of sedation and neck pain. His epiglottis and cords were normal on direct laryngoscopy 10/7 on extubation.  - High risk Of aspiration. He know the risk he like to cont eating.  - Had a long discussion with patient with the nurse present. He decided he would like to eat and drink, he relates that if the PEG tube is not going to prevent him from aspirating or choking he would rather eat and drink. I have explained the risk and benefits to him and understand. He relates there is no point of living longer if he can't enjoy a regular thing he does. He relates he doesn't enjoy a lot of things, but eating and drinking as one the thing he has left that he enjoys.  - I have also discussed with him what happens when he aspiration or chokes he said he would rather be made comfortable  than Going through this again. - he agreed to go to SNF.   Chronic a-fib:  - At home on Cardizem 180 mg QD; however, unable to take PO's as failed FEES 10/12.  - PRN Lopressor to maintain HR < 115.  - restarted Eliquis.   CRI (chronic renal insufficiency)  - Creatinine at baseline.   Procedures:  None  Consultations:  PCCM  Discharge Exam: Filed Vitals:   04/13/14 0608  BP: 134/83  Pulse: 96  Temp: 98.6 F (37 C)  Resp: 18    General: A&O x2 Cardiovascular: RRR Respiratory: good air movement CTA B/L  Discharge Instructions You were cared for by a hospitalist during your hospital stay. If you have any questions about your discharge medications or the care you received while you were in the hospital after you are discharged, you can call the unit and asked to speak with the hospitalist on call if the hospitalist that took care of you is not available. Once you are discharged, your primary care physician will handle any further medical issues. Please note that NO REFILLS for any discharge medications will be authorized once you are discharged, as it is imperative that you return to your primary care physician (or establish a relationship with a primary care physician if you do not have one) for your aftercare needs so that they can reassess your need for medications and monitor your lab values.  Discharge Instructions   Diet -  low sodium heart healthy    Complete by:  As directed      Increase activity slowly    Complete by:  As directed           Current Discharge Medication List    START taking these medications   Details  pantoprazole (PROTONIX) 40 MG tablet Take 1 tablet (40 mg total) by mouth daily.    polyethylene glycol (MIRALAX / GLYCOLAX) packet Take 17 g by mouth daily as needed for mild constipation, moderate constipation or severe constipation. Qty: 14 each, Refills: 0      CONTINUE these medications which have NOT CHANGED   Details  apixaban  (ELIQUIS) 2.5 MG TABS tablet Take 1 tablet (2.5 mg total) by mouth 2 (two) times daily. Qty: 180 tablet, Refills: 2    cholecalciferol (VITAMIN D) 1000 UNITS tablet Take 2,000 Units by mouth daily.      diltiazem (CARDIZEM CD) 180 MG 24 hr capsule Take 1 capsule (180 mg total) by mouth daily. Qty: 30 capsule, Refills: 11    doxazosin (CARDURA) 2 MG tablet Take 2 mg by mouth at bedtime.      finasteride (PROSCAR) 5 MG tablet Take 5 mg by mouth daily.    furosemide (LASIX) 20 MG tablet Take 20 mg by mouth daily.    iron polysaccharides (FERREX 150) 150 MG capsule Take 1 capsule (150 mg total) by mouth daily. Qty: 30 capsule, Refills: 5    magnesium oxide (MAG-OX) 400 MG tablet Take 400 mg by mouth daily.    potassium chloride (KLOR-CON) 10 MEQ CR tablet Take 10 mEq by mouth daily.       STOP taking these medications     omeprazole (PRILOSEC) 20 MG capsule        No Known Allergies Follow-up Information   Follow up with Gwendolyn Grant, MD In 2 weeks. (hospital follow up)    Specialty:  Internal Medicine   Contact information:   [address] [place] [address] [phone]        The results of significant diagnostics from this hospitalization (including imaging, microbiology, ancillary and laboratory) are listed below for reference.    Significant Diagnostic Studies: Ct Soft Tissue Neck W Contrast  03/29/2014   CLINICAL DATA:  Neck pain radiating to the mandible beginning this morning.  EXAM: CT NECK WITH CONTRAST  TECHNIQUE: Multidetector CT imaging of the neck was performed using the standard protocol following the bolus administration of intravenous contrast.  CONTRAST:  75 mL OMNIPAQUE IOHEXOL 300 MG/ML  SOLN  COMPARISON:  Head CT scan 10/08/2013. Cervical spine CT scan 09/26/2009.  FINDINGS: The study is severely limited due to the patient's torticollis. Intracranial contents appear normal. Major caliber vascular structures opacify. No  mass is seen. Endotracheal tube is in place. No lymphadenopathy is identified. Lung apices demonstrate pleural effusions appearing greater on the right. Atherosclerotic vascular disease is identified. No lytic or sclerotic bony lesion is seen. The patient has severe multilevel cervical spondylosis. 0.6 cm anterolisthesis C2 on C3 is identified, unchanged.  IMPRESSION: Markedly limited study demonstrates no acute abnormality of the neck.  Bilateral pleural effusions, greater on the right.  Atherosclerosis.  Severe torticollis and multilevel cervical spondylosis.   Electronically Signed   By: Inge Rise M.D.   On: 03/29/2014 14:47   Dg Chest Portable 1 View  03/31/2014   CLINICAL DATA:  Respiratory distress.  Shortness of breath.  EXAM: PORTABLE CHEST - 1  VIEW  COMPARISON:  Chest x-ray 03/30/2014.  FINDINGS: Endotracheal tube tip 1 cm above the carina. Again proximal repositioning should be considered. NG tube in stable position with tip below the left hemidiaphragm. Cardiomegaly with normal pulmonary vascularity. Mild basilar atelectasis again noted. No significant pulmonary edema. No pleural effusion or pneumothorax. No acute osseus abnormality.  IMPRESSION: 1. Endotracheal tube tip again noted 1 cm above the carina, repositioning should be considered. NG tube in stable position. 2. Stable cardiomegaly, no pulmonary edema. 3. Stable bibasilar subsegmental atelectasis.   Electronically Signed   By: Marcello Moores  Register   On: 03/31/2014 07:56   Dg Chest Port 1 View  03/30/2014   CLINICAL DATA:  Intubation.  EXAM: PORTABLE CHEST - 1 VIEW  COMPARISON:  03/29/2014.  FINDINGS: Endotracheal tube tip again 1 cm from the chronic, proximal repositioning should be considered. NG tube in stable position. Cardiomegaly. Normal pulmonary vascularity. Previously identified pulmonary edema has almost completely cleared. Left lower lobe atelectasis and/or infiltrate remains. No pleural effusion or pneumothorax.  IMPRESSION:  1. Endotracheal tube in stable position 1 cm above the carina, proximal repositioning should be considered. NG tube in stable position with tip below the hemidiaphragm. 2. Interim near complete clearing of pulmonary edema. Stable cardiomegaly. Pulmonary vascularity is normal. 3. Persistent left lower lobe atelectasis and/or infiltrate.   Electronically Signed   By: Marcello Moores  Register   On: 03/30/2014 07:23   Dg Chest Portable 1 View  03/29/2014   CLINICAL DATA:  Hypoxia; congestive heart failure  EXAM: PORTABLE CHEST - 1 VIEW  COMPARISON:  May 19, 2011  FINDINGS: Endotracheal tube tip is 1.0 cm above the carina. No pneumothorax apparent. There is cardiomegaly with mild bibasilar edema. There is consolidation in the medial left base. Elsewhere lungs are clear. No adenopathy. Pulmonary vascularity is within normal limits. No bone lesions.  IMPRESSION: Endotracheal tube as described without pneumothorax. Evidence of a degree of congestive heart failure. Consolidation medial left base.   Electronically Signed   By: Lowella Grip M.D.   On: 03/29/2014 13:36   Dg Abd Portable 1v  04/01/2014   CLINICAL DATA:  Nasogastric tube placement  EXAM: PORTABLE ABDOMEN - 1 VIEW  COMPARISON:  Portable exam 0441 hr compared to 03/31/2014  FINDINGS: Tip of nasogastric tube projects over proximal to mid stomach little changed.  Stool throughout colon.  Distended bowel loops in the upper central abdomen.  No definite bowel wall thickening.  Atelectasis versus consolidation in LEFT lower lobe remains.  Osseous demineralization with scoliosis and degenerative disc/facet disease changes of the thoracolumbar spine.  IMPRESSION: No significant interval change.   Electronically Signed   By: Lavonia Dana M.D.   On: 04/01/2014 07:26   Dg Abd Portable 1v  04/01/2014   CLINICAL DATA:  Repositioning of nasogastric tube.  EXAM: PORTABLE ABDOMEN - 1 VIEW  COMPARISON:  Abdominal radiograph performed earlier today at 4:41 a.m.  FINDINGS:  The patient's enteric tube is again noted overlying the body of the stomach, with the side port overlying the distal esophagus. The visualized bowel gas pattern is grossly unremarkable, with air filled small and large bowel loops seen. No free intra-abdominal air is identified, though evaluation for free air is limited on a single supine view.  Mild left basilar atelectasis is noted. The lung bases are otherwise grossly clear. The cardiomediastinal silhouette is enlarged. Mild degenerative change is noted at the lower lumbar spine. No acute osseous abnormalities are seen.  IMPRESSION: 1. Enteric tube noted ending  overlying the body of stomach, in unchanged position, with the side port overlying the distal esophagus. 2. Mild left basilar atelectasis noted.  Mild cardiomegaly.   Electronically Signed   By: Garald Balding M.D.   On: 04/01/2014 06:49   Dg Abd Portable 1v  03/31/2014   CLINICAL DATA:  Nasogastric tube placement  EXAM: PORTABLE ABDOMEN - 1 VIEW  COMPARISON:  March 30, 2014  FINDINGS: Nasogastric tube tip and side port are in the body of the stomach. There remains generalized bowel dilatation. No free air apparent on this supine examination. There is scoliosis and degenerative type change in the lumbar spine.  IMPRESSION: Nasogastric tube tip and side port in stomach. Bowel remains dilated.   Electronically Signed   By: Lowella Grip M.D.   On: 03/31/2014 18:50   Dg Abd Portable 1v  03/30/2014   CLINICAL DATA:  Nasogastric tube placement; gastroesophageal reflux  EXAM: PORTABLE ABDOMEN - 1 VIEW  COMPARISON:  None.  FINDINGS: Nasogastric tube tip and side port are in the stomach. There is moderate stool throughout colon. Bowel gas pattern unremarkable. There is scoliosis and arthropathy in the lumbar spine.  IMPRESSION: Nasogastric tube tip and side-port in the stomach.   Electronically Signed   By: Lowella Grip M.D.   On: 03/30/2014 11:46    Microbiology: No results found for this or  any previous visit (from the past 240 hour(s)).   Labs: Basic Metabolic Panel:  Recent Labs Lab 04/07/14 1427 04/08/14 0648 04/09/14 0332  NA 151* 152*  --   K 3.3* 3.2*  --   CL 109 109  --   CO2 24 24  --   GLUCOSE 88 93  --   BUN 19 21  --   CREATININE 0.87 0.84  --   CALCIUM 8.6 8.7  --   MG  --   --  2.0   Liver Function Tests: No results found for this basename: AST, ALT, ALKPHOS, BILITOT, PROT, ALBUMIN,  in the last 168 hours No results found for this basename: LIPASE, AMYLASE,  in the last 168 hours No results found for this basename: AMMONIA,  in the last 168 hours CBC:  Recent Labs Lab 04/07/14 0536 04/09/14 0420  WBC 5.1 4.8  HGB 12.1* 12.2*  HCT 36.6* 36.8*  MCV 88.2 88.7  PLT 163 153   Cardiac Enzymes: No results found for this basename: CKTOTAL, CKMB, CKMBINDEX, TROPONINI,  in the last 168 hours BNP: BNP (last 3 results)  Recent Labs  10/09/13 1850  PROBNP 2371.0*   CBG:  Recent Labs Lab 04/07/14 2155 04/08/14 0224 04/08/14 0515 04/08/14 0830 04/08/14 1100  GLUCAP 88 96 97 101* 92       Signed:  FELIZ ORTIZ, Shamikia Linskey  [facility] 04/13/2014, 10:30 AM

## 2014-04-22 NOTE — Progress Notes (Signed)
Staff note  - patient seen by NP. I do not recollect seeing this patient on this day. I might have though.    Dr. Brand Males, M.D., [facility].C.P Pulmonary and Critical Care Medicine Staff Physician [facility] Pager: [phone], If no answer or between  15:00h - 7:00h: call [phone]  04/22/2014 5:03 AM

## 2014-04-27 ENCOUNTER — Inpatient Hospital Stay: Payer: Medicare Other | Admitting: Internal Medicine

## 2014-04-27 DIAGNOSIS — Z0289 Encounter for other administrative examinations: Secondary | ICD-10-CM

## 2014-04-27 DIAGNOSIS — R4689 Other symptoms and signs involving appearance and behavior: Secondary | ICD-10-CM | POA: Insufficient documentation

## 2014-05-11 ENCOUNTER — Ambulatory Visit: Payer: Medicare Other | Admitting: Cardiovascular Disease

## 2014-06-02 ENCOUNTER — Encounter: Payer: Medicare Other | Admitting: Internal Medicine

## 2014-06-16 ENCOUNTER — Encounter: Payer: Medicare Other | Admitting: Internal Medicine

## 2014-06-17 ENCOUNTER — Encounter: Payer: Medicare Other | Admitting: Internal Medicine

## 2014-10-26 ENCOUNTER — Other Ambulatory Visit: Payer: Self-pay | Admitting: *Deleted

## 2014-10-26 DIAGNOSIS — I83009 Varicose veins of unspecified lower extremity with ulcer of unspecified site: Secondary | ICD-10-CM

## 2014-10-26 DIAGNOSIS — L97909 Non-pressure chronic ulcer of unspecified part of unspecified lower leg with unspecified severity: Principal | ICD-10-CM

## 2014-11-04 ENCOUNTER — Encounter: Payer: Self-pay | Admitting: Surgery

## 2014-11-05 ENCOUNTER — Encounter: Payer: Self-pay | Admitting: Surgery

## 2014-11-05 ENCOUNTER — Ambulatory Visit (INDEPENDENT_AMBULATORY_CARE_PROVIDER_SITE_OTHER): Payer: Medicare Other | Admitting: Surgery

## 2014-11-05 ENCOUNTER — Ambulatory Visit (HOSPITAL_COMMUNITY)
Admission: RE | Admit: 2014-11-05 | Discharge: 2014-11-05 | Disposition: A | Discharge status: Home / Self Care | Payer: Medicare Other | Source: Ambulatory Visit | Attending: Surgery | Admitting: Surgery

## 2014-11-05 VITALS — BP 109/73 | HR 80 | Resp 16 | Ht 66.0 in | Wt 139.0 lb

## 2014-11-05 DIAGNOSIS — L97909 Non-pressure chronic ulcer of unspecified part of unspecified lower leg with unspecified severity: Secondary | ICD-10-CM

## 2014-11-05 DIAGNOSIS — I83009 Varicose veins of unspecified lower extremity with ulcer of unspecified site: Secondary | ICD-10-CM | POA: Diagnosis present

## 2014-11-05 DIAGNOSIS — L97321 Non-pressure chronic ulcer of left ankle limited to breakdown of skin: Secondary | ICD-10-CM | POA: Diagnosis not present

## 2014-11-05 NOTE — Progress Notes (Signed)
Patient name: Eduardo Deleon MRN: [id number] DOB: 30-Sep-1924 Sex: male   Referred by: Dr. Lysle Rubens  Reason for referral:  Chief Complaint  Patient presents with  . New Evaluation    wound to left ankle  referred by Dr Tommie Ard    HISTORY OF PRESENT ILLNESS: This is an 81 year old gentleman who comes in today for evaluation of a nonhealing wound on his left medial ankle.  This is been present for several weeks.  For his caregiver today it has gotten dramatically better.  The patient has undergone a CT angiogram which showed no significant stenosis.  He has a history of ulcers in the past which he reports getting better with compression.  He does not complain of swelling today.  The patient has a history of rectal fibrillation and is on anticoagulation.  He is a resident at [facility].  Past Medical History  Diagnosis Date  . 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 Atrial fibrillation     chronic anticaog - weintraub  . Congestive heart failure   . Anemia   . Osteoarthritis   . Splenic lesion   . Venous stasis dermatitis     hx venous ulcer/cellulitis 06/2011 R and 04/2011 L  . Prostate cancer   . Anal fissure     Hx of  . Internal hemorrhoids   . Diverticulosis 04/12/1998    Left colon--Flex Sig  . CRI (chronic renal insufficiency)   . GERD (gastroesophageal reflux disease)   . PVD (peripheral vascular disease)   . Torticollis, acquired   . Tubular adenoma     Past Surgical History  Procedure Laterality Date  . Joint replacement  bilaterial knees  . Lumbar laminectomy  Per patient  heriated disc in mid spine  . Hernia repair      [phone], right - 1985  . Hemorrhoid surgery    . Tonsillectomy    . Colonoscopy      1988  . Direct laryngoscopy N/A 03/31/2014    Procedure: DIRECT LARYNGOSCOPY/EXAM UNDER ANESTHESIA;  Surgeon: [name], MD;  Location: [facility];  Service: ENT;  Laterality: N/A;    History   Social History  . Marital Status: Married    Spouse  Name: N/A  . Number of Children: N/A  . Years of Education: N/A   Occupational History  . Not on file.   Social History Main Topics  . Smoking status: Never Smoker   . Smokeless tobacco: Never Used     Comment: married, wife in [place] since 2013  . Alcohol Use: No  . Drug Use: No  . Sexual Activity: Not on file   Other Topics Concern  . Not on file   Social History Narrative    Family History  Problem Relation Age of Onset  . Heart disease Father   . Breast cancer Mother   . Diabetes Brother   . Diabetes      grandmother  . Colon cancer Neg Hx     Allergies as of 11/05/2014  . (No Known Allergies)    Current Outpatient Prescriptions on File Prior to Visit  Medication Sig Dispense Refill  . apixaban (ELIQUIS) 2.5 MG TABS tablet Take 1 tablet (2.5 mg total) by mouth 2 (two) times daily. 180 tablet 2  . cholecalciferol (VITAMIN D) 1000 UNITS tablet Take 2,000 Units by mouth daily.      Marland Kitchen diltiazem (CARDIZEM CD) 180 MG 24 hr capsule Take 1 capsule (180 mg total) by mouth  daily. 30 capsule 11  . doxazosin (CARDURA) 2 MG tablet Take 2 mg by mouth at bedtime.      . finasteride (PROSCAR) 5 MG tablet Take 5 mg by mouth daily.    . furosemide (LASIX) 20 MG tablet Take 20 mg by mouth daily.    . iron polysaccharides (FERREX 150) 150 MG capsule Take 1 capsule (150 mg total) by mouth daily. 30 capsule 5  . magnesium oxide (MAG-OX) 400 MG tablet Take 400 mg by mouth daily.    . pantoprazole (PROTONIX) 40 MG tablet Take 1 tablet (40 mg total) by mouth daily.    . polyethylene glycol (MIRALAX / GLYCOLAX) packet Take 17 g by mouth daily as needed for mild constipation, moderate constipation or severe constipation. 14 each 0  . potassium chloride (KLOR-CON) 10 MEQ CR tablet Take 10 mEq by mouth daily.      No current facility-administered medications on file prior to visit.     REVIEW OF SYSTEMS: Cardiovascular: No chest pain, chest pressure, palpitations, orthopnea, or dyspnea on  exertion. No claudication or rest pain,  No history of DVT or phlebitis. Pulmonary: No productive cough, asthma or wheezing. Neurologic: No weakness, paresthesias, aphasia, or amaurosis. No dizziness. Hematologic: No bleeding problems or clotting disorders. Musculoskeletal: No joint pain or joint swelling. Gastrointestinal: No blood in stool or hematemesis Genitourinary: No dysuria or hematuria. Psychiatric:: No history of major depression. Integumentary: Left ankle ulcer Constitutional: No fever or chills.  PHYSICAL EXAMINATION:  Filed Vitals:   11/05/14 0925  BP: 109/73  Pulse: 80  Resp: 16  Height: 5\' 6"  (1.676 m)  Weight: 139 lb (63.05 kg)   Body mass index is 22.45 kg/(m^2). General: The patient appears their stated age.   HEENT:  No gross abnormalities Pulmonary: Respirations are non-labored Musculoskeletal: There are no major deformities.   Neurologic: No focal weakness or paresthesias are detected, Skin: 4 mm superficial ulcer on the medial left ankle.  Chronic skin color changes Psychiatric: The patient has normal affect. Cardiovascular: Palpable pedal pulses.  Trace edema  Diagnostic Studies: Outside report of CT angiogram shows no stenoses. ABIs were performed here.  The number is slightly elevated suggesting medial calcification.  The waveforms are normal.   Assessment:  Left ankle ulcer Plan: According to his nurse, the wound looks to medically better today than it did 2 weeks ago.  My impression is that this has nearly healed.  He has just trace edema in the leg.  Although he has a history of ulcers in the past which did heal with compression, given the amount of swelling currently, I would not recommend compression, especially since this wound appears to be healing.  Continue with local wound care.  If the wound gets larger, the next step would be to place a [employer].     Eldridge Abrahams, M.D. [facility] Office:  [phone] Pager:  [phone]

## 2015-01-11 ENCOUNTER — Encounter: Payer: Self-pay | Admitting: Cardiovascular Disease

## 2015-05-05 ENCOUNTER — Ambulatory Visit: Payer: Self-pay | Admitting: Cardiovascular Disease

## 2015-05-26 ENCOUNTER — Encounter: Payer: Self-pay | Admitting: Cardiovascular Disease

## 2015-05-26 ENCOUNTER — Ambulatory Visit (INDEPENDENT_AMBULATORY_CARE_PROVIDER_SITE_OTHER): Payer: Medicare Other | Admitting: Cardiovascular Disease

## 2015-05-26 VITALS — BP 120/62 | HR 68 | Ht 66.0 in

## 2015-05-26 DIAGNOSIS — I8312 Varicose veins of left lower extremity with inflammation: Secondary | ICD-10-CM

## 2015-05-26 DIAGNOSIS — I482 Chronic atrial fibrillation, unspecified: Secondary | ICD-10-CM

## 2015-05-26 DIAGNOSIS — I4821 Permanent atrial fibrillation: Secondary | ICD-10-CM

## 2015-05-26 DIAGNOSIS — Z7901 Long term (current) use of anticoagulants: Secondary | ICD-10-CM | POA: Diagnosis not present

## 2015-05-26 DIAGNOSIS — I8311 Varicose veins of right lower extremity with inflammation: Secondary | ICD-10-CM

## 2015-05-26 DIAGNOSIS — Z9229 Personal history of other drug therapy: Secondary | ICD-10-CM

## 2015-05-26 DIAGNOSIS — I872 Venous insufficiency (chronic) (peripheral): Secondary | ICD-10-CM

## 2015-05-26 DIAGNOSIS — I878 Other specified disorders of veins: Secondary | ICD-10-CM

## 2015-05-26 DIAGNOSIS — C61 Malignant neoplasm of prostate: Secondary | ICD-10-CM

## 2015-05-26 NOTE — Progress Notes (Signed)
Patient ID: Eduardo Deleon, male   DOB: 05/31/1925, 81 y.o.   MRN: [id number]         HPI: Eduardo Deleon is an 81 year old  Male who is a former patient of Dr. Rollene Fare.  Eduardo Deleon establish cardiology care with me in March 2015.  Eduardo Deleon presents for a 21 month follow-upevaluation.   Eduardo Deleon has a history of sick sinus syndrome.  Both Eduardo Deleon and Eduardo Deleon wife are residing at [facility] and Eduardo Deleon is followed by Dr. Deforest Hoyles, for primary care.  Eduardo Deleon has a history of irritable bowel syndrome.  In 2009 Eduardo Deleon underwent cardioversion for atrial fibrillation and subsequently went back into atrial fibrillation.   Eduardo Deleon has been in permanent atrial fibrillation since and was on chronic Coumadin therapy. when I saw him 18 months ago, Eduardo Deleon was inconsistent with being able to get Coumadin blood checks.  At that time, I elected to switch him to eloquence 2.5 mg twice a day for anticoagulation to obviate the need for blood checks and transportation issues.rotimes.  In October 2010 Eduardo Deleon had a negative Myoview for ischemia.  Eduardo Deleon last echo Doppler study in September 2013 showed an ejection fraction of 40-45% with atrial fibrillation without significant valvular pathology.  Eduardo Deleon also has a history of GERD, a history of slow-growing prostate CA followed by Dr. Risa Grill.  Eduardo Deleon also has a history of irratable bowel syndrome.  Eduardo Deleon denies recent chest pain.  Eduardo Deleon does have permanent atrial fibrillation.  Over the past year, Eduardo Deleon states that Eduardo Deleon has remained fairly stable.  Eduardo Deleon has venous stasis changes.  Previously Eduardo Deleon had significant edema but this has improved.  Eduardo Deleon had undergone vascular many Doppler study in May 2016 which demonstrated a resting right ankle-brachial index greater than 1.54 indicating the presence of tibial artery medial calcification.  The waveform was strongly triphasic and was without evidence for arterial  insufficiency.  The left ankle brachial index was 1.26 which was normal.   Eduardo Deleon denies any chest pain.  Eduardo Deleon denies shortness  of breath.  Past Medical History  Diagnosis Date  . Stroke [facility]) 1978 lower brain stem  . Atrial fibrillation (HCC)     chronic anticaog - weintraub  . Congestive heart failure ([place])   . Anemia   . Osteoarthritis   . Splenic lesion   . Venous stasis dermatitis     hx venous ulcer/cellulitis 06/2011 R and 04/2011 L  . Prostate cancer ([place])   . Anal fissure     Hx of  . Internal hemorrhoids   . Diverticulosis 04/12/1998    Left colon--Flex Sig  . CRI (chronic renal insufficiency)   . GERD (gastroesophageal reflux disease)   . PVD (peripheral vascular disease) ([place])   . Torticollis, acquired   . Tubular adenoma     Past Surgical History  Procedure Laterality Date  . Joint replacement  bilaterial knees  . Lumbar laminectomy  Per patient  heriated disc in mid spine  . Hernia repair      [phone], right - 1985  . Hemorrhoid surgery    . Tonsillectomy    . Colonoscopy      1988  . Direct laryngoscopy N/A 03/31/2014    Procedure: DIRECT LARYNGOSCOPY/EXAM UNDER ANESTHESIA;  Surgeon: [name], MD;  Location: [place];  Service: ENT;  Laterality: N/A;    No Known Allergies  Current Outpatient Prescriptions  Medication Sig Dispense Refill  . apixaban (ELIQUIS) 2.5 MG TABS tablet Take 1 tablet (2.5 mg total) by mouth  2 (two) times daily. 180 tablet 2  . cholecalciferol (VITAMIN D) 1000 UNITS tablet Take 2,000 Units by mouth daily.      Marland Kitchen diltiazem (CARDIZEM CD) 180 MG 24 hr capsule Take 1 capsule (180 mg total) by mouth daily. 30 capsule 11  . doxazosin (CARDURA) 2 MG tablet Take 2 mg by mouth at bedtime.      . finasteride (PROSCAR) 5 MG tablet Take 5 mg by mouth daily.    . furosemide (LASIX) 20 MG tablet Take 20 mg by mouth daily.    . iron polysaccharides (FERREX 150) 150 MG capsule Take 1 capsule (150 mg total) by mouth daily. 30 capsule 5  . magnesium oxide (MAG-OX) 400 MG tablet Take 400 mg by mouth daily.    . pantoprazole (PROTONIX) 40 MG tablet Take 1 tablet (40 mg  total) by mouth daily.    . polyethylene glycol (MIRALAX / GLYCOLAX) packet Take 17 g by mouth daily as needed for mild constipation, moderate constipation or severe constipation. 14 each 0  . potassium chloride (KLOR-CON) 10 MEQ CR tablet Take 10 mEq by mouth daily.      No current facility-administered medications for this visit.    Eduardo Deleon is married and has no children.  Both Eduardo Deleon and Eduardo Deleon wife reside in nursing homes.  There is no tobacco use.  Family History  Problem Relation Age of Onset  . Heart disease Father   . Breast cancer Mother   . Diabetes Brother   . Diabetes      grandmother  . Colon cancer Neg Hx      ROS General: Negative; No fevers, chills, or night sweats;  HEENT: Negative; No changes in vision or hearing, sinus congestion, difficulty swallowing Pulmonary: Negative; No cough, wheezing, shortness of breath, hemoptysis Cardiovascular: Negative; No chest pain, presyncope, syncope, palpitations GI: Negative; No nausea, vomiting, diarrhea, or abdominal pain GU:  History of slow-growing prostate CA. Musculoskeletal: Negative; no myalgias, joint pain, or weakness Hematologic/Oncology: Negative; no easy bruising, bleeding Endocrine: Negative; no heat/cold intolerance; no diabetes Neuro: Negative; no changes in balance, headaches Skin: Negative; No rashes or skin lesions Psychiatric: Negative; No behavioral problems, depression Sleep: Negative; No snoring, daytime sleepiness, hypersomnolence, bruxism, restless legs, hypnogognic hallucinations, no cataplexy Other comprehensive 14 point system review is negative.   PE BP 120/62 mmHg  Pulse 68  Ht 5' 6"  (1.676 m)   Wt Readings from Last 3 Encounters:  11/05/14 139 lb (63.05 kg)  04/13/14 115 lb 4.8 oz (52.3 kg)  02/23/14 136 lb 6 oz (61.859 kg)   General: Alert, oriented, no distress.  Skin: normal turgor, no rashes HEENT: Eduardo Deleon has significant deformity leading to Eduardo Deleon head leftward;  Normocephalic, atraumatic. Pupils  round and reactive; sclera anicteric; extraocular muscles intact;  Nose without nasal septal hypertrophy Mouth/Parynx benign; Mallinpatti scale 2 Neck: No JVD, no carotid bruits; normal carotid upstroke Lungs: clear to ausculatation and percussion; no wheezing or rales Chest wall: without tenderness to palpitation Heart: Irregularly irregular rhythm with a ventricular rate in the 60s., s1 s2 normal 1/6 systolic murmur.  No S3 gallop. Abdomen: soft, nontender; no hepatosplenomehaly, BS+; abdominal aorta nontender and not dilated by palpation. Back: no CVA tenderness Pulses 2+ Extremities:  Significant bilateral venous stasis changes.  Trace ankle edema., Homan's sign negative  Neurologic: grossly nonfocal; Cranial nerves grossly wnl Psychologic: Normal mood and affect  ECG (independently read by me):  Atrial fibrillation at 68 bpm.  Incomplete right bundle branch block. Q waves  in lead 3 and aVF. Q wave V1 V2 and V3.  ECG (independently read by me): Atrial fibrillation with a ventricular rate in the 90s.  Previously noted T-wave changes anteriorly.  Left axis deviation, with left anterior hemiblock.  LABS:  In addition to below, recent laboratory done at [facility] from 03/27/2005 was reviewed.  BUN 23.9 creatinine 1.07. Hemoglobin 11.4, hematocrit 35.9. Vitamin D greater than 60. Iron studies normal.  BMP Latest Ref Rng 04/08/2014 04/07/2014 04/06/2014  Glucose 70 - 99 mg/dL 93 88 97  BUN 6 - 23 mg/dL 21 19 22   Creatinine 0.50 - 1.35 mg/dL 0.84 0.87 0.86  Sodium 137 - 147 mEq/L 152(H) 151(H) 148(H)  Potassium 3.7 - 5.3 mEq/L 3.2(L) 3.3(L) 3.3(L)  Chloride 96 - 112 mEq/L 109 109 104  CO2 19 - 32 mEq/L 24 24 25   Calcium 8.4 - 10.5 mg/dL 8.7 8.6 8.7   Hepatic Function Latest Ref Rng 10/08/2013 03/23/2013 06/29/2011  Total Protein 6.0 - 8.3 g/dL 5.9(L) 7.2 5.6(L)  Albumin 3.5 - 5.2 g/dL 3.2(L) 4.4 2.7(L)  AST 0 - 37 U/L 44(H) 16 17  ALT 0 - 53 U/L 20 10 14   Alk Phosphatase  39 - 117 U/L 82 72 62  Total Bilirubin 0.3 - 1.2 mg/dL 0.9 0.8 0.3   CBC Latest Ref Rng 04/09/2014 04/07/2014 04/06/2014  WBC 4.0 - 10.5 K/uL 4.8 5.1 5.7  Hemoglobin 13.0 - 17.0 g/dL 12.2(L) 12.1(L) 12.7(L)  Hematocrit 39.0 - 52.0 % 36.8(L) 36.6(L) 38.0(L)  Platelets 150 - 400 K/uL 153 163 168   Lab Results  Component Value Date   MCV 88.7 04/09/2014   MCV 88.2 04/07/2014   MCV 87.4 04/06/2014   Lab Results  Component Value Date   TSH 2.940 10/09/2013   Lipid Panel  No results found for: CHOL, TRIG, HDL, CHOLHDL, VLDL, LDLCALC, LDLDIRECT   RADIOLOGY: Ct Head Wo Contrast  10/08/2013   CLINICAL DATA:  Medical clearance. Found on floor. Hallucinations. Prostate cancer.  EXAM: CT HEAD WITHOUT CONTRAST  TECHNIQUE: Contiguous axial images were obtained from the base of the skull through the vertex without contrast.  COMPARISON:  06/17/2011.  FINDINGS: Advanced atrophy with chronic microvascular ischemic change. No acute stroke or hemorrhage is evident. Calvarium intact. No visible blastic metastases. No acute sinus or mastoid fluid. Similar appearance to priors.  IMPRESSION: Atrophy and chronic microvascular ischemic change. No acute intracranial findings.   Electronically Signed   By: Rolla Flatten M.D.   On: 10/08/2013 14:29     ASSESSMENT AND PLAN: Eduardo Deleon is an 81 year old gentleman with sick sinus syndrome and permanent atrial fibrillation with rate control.  Over the past year, Eduardo Deleon has tolerated  The change from Coumadin to eliquis for anticoagulation and denies bleeding.  Previously, Eduardo Deleon was noncompliant due to transportation issues and getting Eduardo Deleon pro times checked while on Coumadin.  Eduardo Deleon atrial fibrillation rate is well-controlled. Eduardo Deleon is not having any anginal symptoms. Eduardo Deleon does not have any overt heart failure. Eduardo Deleon lower [place] arterial Doppler study was reviewed which did not reveal any evidence for arterial insufficiency to Eduardo Deleon lower extremities. Eduardo Deleon blood  pressure today is controlled on diltiazem 1.  An 80 mg in addition to Cardura to milligrams. Eduardo Deleon GERD is controlled on pantoprazole. Eduardo Deleon is followed urologically. As long as Eduardo Deleon is stable cardiovascularly, I will see him on an as-needed basis if problems arise.   Time spent: 25 minutes  Troy Sine, MD, [facility] 05/26/2015 7:07 PM

## 2015-05-26 NOTE — Patient Instructions (Signed)
Your physician recommends that you schedule a follow-up appointment as needed with Dr. Kelly. 

## 2016-04-10 ENCOUNTER — Encounter (HOSPITAL_COMMUNITY)
Admission: EM | Disposition: A | Discharge status: Skilled Nursing Facility | Payer: Self-pay | Source: Home / Self Care | Attending: Internal Medicine

## 2016-04-10 ENCOUNTER — Inpatient Hospital Stay (HOSPITAL_COMMUNITY)
Admission: EM | Admit: 2016-04-10 | Discharge: 2016-04-12 | DRG: 389 | Disposition: A | Discharge status: Skilled Nursing Facility | Payer: Medicare Other | Attending: Internal Medicine | Admitting: Internal Medicine

## 2016-04-10 ENCOUNTER — Encounter (HOSPITAL_COMMUNITY): Payer: Self-pay

## 2016-04-10 ENCOUNTER — Emergency Department (HOSPITAL_COMMUNITY): Payer: Medicare Other

## 2016-04-10 DIAGNOSIS — K562 Volvulus: Secondary | ICD-10-CM | POA: Diagnosis present

## 2016-04-10 DIAGNOSIS — Z96653 Presence of artificial knee joint, bilateral: Secondary | ICD-10-CM | POA: Diagnosis present

## 2016-04-10 DIAGNOSIS — I482 Chronic atrial fibrillation, unspecified: Secondary | ICD-10-CM | POA: Diagnosis present

## 2016-04-10 DIAGNOSIS — L8990 Pressure ulcer of unspecified site, unspecified stage: Secondary | ICD-10-CM | POA: Insufficient documentation

## 2016-04-10 DIAGNOSIS — Z7901 Long term (current) use of anticoagulants: Secondary | ICD-10-CM

## 2016-04-10 DIAGNOSIS — I110 Hypertensive heart disease with heart failure: Secondary | ICD-10-CM | POA: Diagnosis present

## 2016-04-10 DIAGNOSIS — Z803 Family history of malignant neoplasm of breast: Secondary | ICD-10-CM | POA: Diagnosis not present

## 2016-04-10 DIAGNOSIS — I5042 Chronic combined systolic (congestive) and diastolic (congestive) heart failure: Secondary | ICD-10-CM | POA: Diagnosis present

## 2016-04-10 DIAGNOSIS — Z8673 Personal history of transient ischemic attack (TIA), and cerebral infarction without residual deficits: Secondary | ICD-10-CM | POA: Diagnosis not present

## 2016-04-10 DIAGNOSIS — H9190 Unspecified hearing loss, unspecified ear: Secondary | ICD-10-CM | POA: Diagnosis present

## 2016-04-10 DIAGNOSIS — Z66 Do not resuscitate: Secondary | ICD-10-CM | POA: Diagnosis present

## 2016-04-10 DIAGNOSIS — M1990 Unspecified osteoarthritis, unspecified site: Secondary | ICD-10-CM | POA: Diagnosis present

## 2016-04-10 DIAGNOSIS — Z833 Family history of diabetes mellitus: Secondary | ICD-10-CM | POA: Diagnosis not present

## 2016-04-10 DIAGNOSIS — I739 Peripheral vascular disease, unspecified: Secondary | ICD-10-CM | POA: Diagnosis present

## 2016-04-10 DIAGNOSIS — E876 Hypokalemia: Secondary | ICD-10-CM | POA: Diagnosis present

## 2016-04-10 DIAGNOSIS — I4891 Unspecified atrial fibrillation: Secondary | ICD-10-CM | POA: Diagnosis not present

## 2016-04-10 DIAGNOSIS — Z8249 Family history of ischemic heart disease and other diseases of the circulatory system: Secondary | ICD-10-CM

## 2016-04-10 DIAGNOSIS — N400 Enlarged prostate without lower urinary tract symptoms: Secondary | ICD-10-CM | POA: Diagnosis present

## 2016-04-10 DIAGNOSIS — M436 Torticollis: Secondary | ICD-10-CM | POA: Diagnosis present

## 2016-04-10 DIAGNOSIS — M6281 Muscle weakness (generalized): Secondary | ICD-10-CM

## 2016-04-10 DIAGNOSIS — I5022 Chronic systolic (congestive) heart failure: Secondary | ICD-10-CM | POA: Diagnosis present

## 2016-04-10 DIAGNOSIS — K219 Gastro-esophageal reflux disease without esophagitis: Secondary | ICD-10-CM | POA: Diagnosis present

## 2016-04-10 DIAGNOSIS — Z01810 Encounter for preprocedural cardiovascular examination: Secondary | ICD-10-CM | POA: Diagnosis not present

## 2016-04-10 DIAGNOSIS — R1032 Left lower quadrant pain: Secondary | ICD-10-CM | POA: Diagnosis present

## 2016-04-10 DIAGNOSIS — C61 Malignant neoplasm of prostate: Secondary | ICD-10-CM | POA: Diagnosis present

## 2016-04-10 DIAGNOSIS — I5032 Chronic diastolic (congestive) heart failure: Secondary | ICD-10-CM | POA: Diagnosis not present

## 2016-04-10 DIAGNOSIS — Z8546 Personal history of malignant neoplasm of prostate: Secondary | ICD-10-CM | POA: Diagnosis not present

## 2016-04-10 HISTORY — PX: FLEXIBLE SIGMOIDOSCOPY: SHX5431

## 2016-04-10 LAB — CBC WITH DIFFERENTIAL/PLATELET
BASOS PCT: 0 %
Basophils Absolute: 0 10*3/uL (ref 0.0–0.1)
EOS ABS: 0 10*3/uL (ref 0.0–0.7)
Eosinophils Relative: 0 %
HCT: 38.7 % — ABNORMAL LOW (ref 39.0–52.0)
HEMOGLOBIN: 12.6 g/dL — AB (ref 13.0–17.0)
Lymphocytes Relative: 15 %
Lymphs Abs: 0.8 10*3/uL (ref 0.7–4.0)
MCH: 29.2 pg (ref 26.0–34.0)
MCHC: 32.6 g/dL (ref 30.0–36.0)
MCV: 89.8 fL (ref 78.0–100.0)
Monocytes Absolute: 0.1 10*3/uL (ref 0.1–1.0)
Monocytes Relative: 3 %
Neutro Abs: 4.1 10*3/uL (ref 1.7–7.7)
Neutrophils Relative %: 82 %
Platelets: 162 10*3/uL (ref 150–400)
RBC: 4.31 MIL/uL (ref 4.22–5.81)
RDW: 15.2 % (ref 11.5–15.5)
WBC: 5 10*3/uL (ref 4.0–10.5)

## 2016-04-10 LAB — COMPREHENSIVE METABOLIC PANEL
ALBUMIN: 3.6 g/dL (ref 3.5–5.0)
ALT: 12 U/L — AB (ref 17–63)
ANION GAP: 7 (ref 5–15)
AST: 18 U/L (ref 15–41)
Alkaline Phosphatase: 77 U/L (ref 38–126)
BUN: 17 mg/dL (ref 6–20)
CALCIUM: 8.7 mg/dL — AB (ref 8.9–10.3)
CO2: 29 mmol/L (ref 22–32)
Chloride: 106 mmol/L (ref 101–111)
Creatinine, Ser: 0.99 mg/dL (ref 0.61–1.24)
GFR calc Af Amer: >60 mL/min (ref >60)
GFR calc non Af Amer: >60 mL/min (ref >60)
GLUCOSE: 132 mg/dL — AB (ref 65–99)
Potassium: 3.4 mmol/L — ABNORMAL LOW (ref 3.5–5.1)
SODIUM: 142 mmol/L (ref 135–145)
Total Bilirubin: 0.8 mg/dL (ref 0.3–1.2)
Total Protein: 6.8 g/dL (ref 6.5–8.1)

## 2016-04-10 LAB — CBG MONITORING, ED: GLUCOSE-CAPILLARY: 108 mg/dL — AB (ref 65–99)

## 2016-04-10 LAB — LIPASE, BLOOD: Lipase: 26 U/L (ref 11–51)

## 2016-04-10 LAB — I-STAT CG4 LACTIC ACID, ED
LACTIC ACID, VENOUS: 0.7 mmol/L (ref 0.5–1.9)
Lactic Acid, Venous: 1.45 mmol/L (ref 0.5–1.9)

## 2016-04-10 SURGERY — SIGMOIDOSCOPY, FLEXIBLE
Anesthesia: Moderate Sedation

## 2016-04-10 MED ORDER — FENTANYL CITRATE (PF) 100 MCG/2ML IJ SOLN
INTRAMUSCULAR | Status: AC
  Filled 2016-04-10: qty 2

## 2016-04-10 MED ORDER — SODIUM CHLORIDE 0.9 % IV BOLUS (SEPSIS)
500.0000 mL | Freq: Once | INTRAVENOUS | Status: AC
  Administered 2016-04-10: 500 mL via INTRAVENOUS

## 2016-04-10 MED ORDER — FENTANYL CITRATE (PF) 100 MCG/2ML IJ SOLN
50.0000 ug | Freq: Once | INTRAMUSCULAR | Status: AC
  Administered 2016-04-10: 50 ug via INTRAVENOUS
  Filled 2016-04-10: qty 2

## 2016-04-10 MED ORDER — MIDAZOLAM HCL 10 MG/2ML IJ SOLN
INTRAMUSCULAR | Status: DC | PRN
  Administered 2016-04-10: 1 mg via INTRAVENOUS

## 2016-04-10 MED ORDER — FINASTERIDE 5 MG PO TABS
5.0000 mg | ORAL_TABLET | Freq: Every day | ORAL | Status: DC
  Administered 2016-04-11 – 2016-04-12 (×2): 5 mg via ORAL
  Filled 2016-04-10 (×2): qty 1

## 2016-04-10 MED ORDER — FENTANYL CITRATE (PF) 100 MCG/2ML IJ SOLN
INTRAMUSCULAR | Status: DC | PRN
  Administered 2016-04-10: 12.5 ug via INTRAVENOUS

## 2016-04-10 MED ORDER — MAGNESIUM OXIDE 400 (241.3 MG) MG PO TABS
400.0000 mg | ORAL_TABLET | Freq: Every day | ORAL | Status: DC
  Administered 2016-04-11 – 2016-04-12 (×2): 400 mg via ORAL
  Filled 2016-04-10 (×2): qty 1

## 2016-04-10 MED ORDER — IOPAMIDOL (ISOVUE-300) INJECTION 61%
INTRAVENOUS | Status: AC
  Administered 2016-04-10: 100 mL
  Filled 2016-04-10: qty 100

## 2016-04-10 MED ORDER — SODIUM CHLORIDE 0.9 % IV SOLN
INTRAVENOUS | Status: DC
  Administered 2016-04-11: via INTRAVENOUS

## 2016-04-10 MED ORDER — DOXAZOSIN MESYLATE 2 MG PO TABS
2.0000 mg | ORAL_TABLET | Freq: Every day | ORAL | Status: DC
  Administered 2016-04-11 (×2): 2 mg via ORAL
  Filled 2016-04-10 (×3): qty 1

## 2016-04-10 MED ORDER — PANTOPRAZOLE SODIUM 40 MG PO TBEC
40.0000 mg | DELAYED_RELEASE_TABLET | Freq: Every day | ORAL | Status: DC
Start: 2016-04-11 — End: 2016-04-12
  Administered 2016-04-11 – 2016-04-12 (×2): 40 mg via ORAL
  Filled 2016-04-10 (×2): qty 1

## 2016-04-10 MED ORDER — SODIUM CHLORIDE 0.9 % IV SOLN
INTRAVENOUS | Status: DC
  Administered 2016-04-10: 20:00:00 via INTRAVENOUS

## 2016-04-10 MED ORDER — MIDAZOLAM HCL 5 MG/ML IJ SOLN
INTRAMUSCULAR | Status: AC
  Filled 2016-04-10: qty 2

## 2016-04-10 MED ORDER — DILTIAZEM HCL ER COATED BEADS 180 MG PO CP24
180.0000 mg | ORAL_CAPSULE | Freq: Every day | ORAL | Status: DC
  Administered 2016-04-11 – 2016-04-12 (×2): 180 mg via ORAL
  Filled 2016-04-10 (×2): qty 1

## 2016-04-10 NOTE — ED Notes (Signed)
Endo team left bedside post-procedure. Informed this RN that patient has returned to baseline and their attempt to correct bowel abnormality was successful.

## 2016-04-10 NOTE — Consult Note (Signed)
Reason for Consult:sigmoid volvulus Referring Physician: Ellender Hose MD   Eduardo Deleon is an 82 y.o. male.  HPI: Asked to see patient at request of EDP for LLQ abdominal pain for 5 days and no BM.  Pt has hx of normal BM until 5 days ago.  Now with obstipation but no overt distress.  Pain is constant and he feels full.  No vomiting.  CT shows findings consistent with sigmoid volvulus.    Past Medical History:  Diagnosis Date  . Anal fissure    Hx of  . Anemia   . Atrial fibrillation (HCC)    chronic anticaog - weintraub  . Congestive heart failure ([place])   . CRI (chronic renal insufficiency)   . Diverticulosis 04/12/1998   Left colon--Flex Sig  . GERD (gastroesophageal reflux disease)   . Internal hemorrhoids   . Osteoarthritis   . Prostate cancer ([place])   . PVD (peripheral vascular disease) ([place])   . Splenic lesion   . Stroke [facility]) 1978 lower brain stem  . Torticollis, acquired   . Tubular adenoma   . Venous stasis dermatitis    hx venous ulcer/cellulitis 06/2011 R and 04/2011 L    Past Surgical History:  Procedure Laterality Date  . COLONOSCOPY     1988  . DIRECT LARYNGOSCOPY N/A 03/31/2014   Procedure: DIRECT LARYNGOSCOPY/EXAM UNDER ANESTHESIA;  Surgeon: [name], MD;  Location: [place];  Service: ENT;  Laterality: N/A;  . HEMORRHOID SURGERY    . HERNIA REPAIR     left-1982, right - 1985  . JOINT REPLACEMENT  bilaterial knees  . LUMBAR LAMINECTOMY  Per patient  heriated disc in mid spine  . TONSILLECTOMY      Family History  Problem Relation Age of Onset  . Heart disease Father   . Breast cancer Mother   . Diabetes Brother   . Diabetes      grandmother  . Colon cancer Neg Hx     Social History:  reports that he has never smoked. He has never used smokeless tobacco. He reports that he does not drink alcohol or use drugs.  Allergies: No Known Allergies  Medications: I have reviewed the patient's current medications.  Results for orders placed or performed  during the hospital encounter of 04/10/16 (from the past 48 hour(s))  CBC with Differential     Status: Abnormal   Collection Time: 04/10/16  4:25 PM  Result Value Ref Range   WBC 5.0 4.0 - 10.5 K/uL   RBC 4.31 4.22 - 5.81 MIL/uL   Hemoglobin 12.6 (L) 13.0 - 17.0 g/dL   HCT 38.7 (L) 39.0 - 52.0 %   MCV 89.8 78.0 - 100.0 fL   MCH 29.2 26.0 - 34.0 pg   MCHC 32.6 30.0 - 36.0 g/dL   RDW 15.2 11.5 - 15.5 %   Platelets 162 150 - 400 K/uL   Neutrophils Relative % 82 %   Neutro Abs 4.1 1.7 - 7.7 K/uL   Lymphocytes Relative 15 %   Lymphs Abs 0.8 0.7 - 4.0 K/uL   Monocytes Relative 3 %   Monocytes Absolute 0.1 0.1 - 1.0 K/uL   Eosinophils Relative 0 %   Eosinophils Absolute 0.0 0.0 - 0.7 K/uL   Basophils Relative 0 %   Basophils Absolute 0.0 0.0 - 0.1 K/uL  Comprehensive metabolic panel     Status: Abnormal   Collection Time: 04/10/16  4:25 PM  Result Value Ref Range   Sodium 142 135 - 145  mmol/L   Potassium 3.4 (L) 3.5 - 5.1 mmol/L   Chloride 106 101 - 111 mmol/L   CO2 29 22 - 32 mmol/L   Glucose, Bld 132 (H) 65 - 99 mg/dL   BUN 17 6 - 20 mg/dL   Creatinine, Ser 0.99 0.61 - 1.24 mg/dL   Calcium 8.7 (L) 8.9 - 10.3 mg/dL   Total Protein 6.8 6.5 - 8.1 g/dL   Albumin 3.6 3.5 - 5.0 g/dL   AST 18 15 - 41 U/L   ALT 12 (L) 17 - 63 U/L   Alkaline Phosphatase 77 38 - 126 U/L   Total Bilirubin 0.8 0.3 - 1.2 mg/dL   GFR calc non Af Amer >60 >60 mL/min   GFR calc Af Amer >60 >60 mL/min    Comment: (NOTE) The eGFR has been calculated using the CKD EPI equation. This calculation has not been validated in all clinical situations. eGFR's persistently <60 mL/min signify possible Chronic Kidney Disease.    Anion gap 7 5 - 15  Lipase, blood     Status: None   Collection Time: 04/10/16  4:25 PM  Result Value Ref Range   Lipase 26 11 - 51 U/L  I-Stat CG4 Lactic Acid, ED     Status: None   Collection Time: 04/10/16  5:25 PM  Result Value Ref Range   Lactic Acid, Venous 1.45 0.5 - 1.9 mmol/L     Ct Abdomen Pelvis W Contrast  Result Date: 04/10/2016 CLINICAL DATA:  Lower abdominal pain and distention.  Constipation. EXAM: CT ABDOMEN AND PELVIS WITH CONTRAST TECHNIQUE: Multidetector CT imaging of the abdomen and pelvis was performed using the standard protocol following bolus administration of intravenous contrast. CONTRAST:  130m ISOVUE-300 IOPAMIDOL (ISOVUE-300) INJECTION 61% COMPARISON:  CT scan of December 03, 2007. FINDINGS: Lower chest: Minimal bibasilar subsegmental atelectasis is noted. Hepatobiliary: No gallstones are noted.  Normal liver. Pancreas: Fatty replacement of the pancreas is noted. Spleen: Stable hemangioma seen in the spleen. Adrenals/Urinary Tract: Adrenal glands and kidneys appear normal. No hydronephrosis or renal obstruction is noted. Mild urinary bladder distention is noted. Stomach/Bowel: The appendix appears normal. There is noted volvulus involving the distal portion of sigmoid colon resulting in dilatation of the more proximal colon and a large amount of stool present. No small bowel dilatation is noted. Vascular/Lymphatic: Atherosclerosis of abdominal aorta is noted without aneurysm formation. No adenopathy is noted. Reproductive: Mildly enlarged prostate gland is noted with associated calcifications. Other: No abnormal fluid collection is noted. Musculoskeletal: Severe multilevel degenerative disc disease is noted in the thoracic and lumbar spine. IMPRESSION: Aortic atherosclerosis. Volvulus is seen involving the distal portion of the sigmoid colon resulting in significant obstruction. Critical Value/emergent results were called by telephone at the time of interpretation on 04/10/2016 at 6:18 pm to [place], who verbally acknowledged these results. Electronically Signed   By: JMarijo Conception M.D.   On: 04/10/2016 18:18    Review of Systems  Constitutional: Negative for chills and fever.  HENT:       Torticollis   Eyes: Negative for blurred vision.   Respiratory: Positive for shortness of breath.   Cardiovascular: Negative for chest pain.  Gastrointestinal: Positive for abdominal pain, constipation, diarrhea and vomiting.  Musculoskeletal: Negative.   Skin: Negative for rash.  Psychiatric/Behavioral: Negative.    Blood pressure 124/87, pulse 89, temperature 97.8 F (36.6 C), temperature source Oral, resp. rate 20, height 5' 6"  (1.676 m), weight 65.8 kg (145 lb), SpO2 97 %.  Physical Exam  Constitutional:  HOH  HENT:  Head: Normocephalic.  TORTICOLLIS  SEVERE  Eyes: Pupils are equal, round, and reactive to light.  Neck:  SEE ABOVE  Cardiovascular:  A FIB  RATE 80   Respiratory: Effort normal.  GI: He exhibits distension.  MINIMAL LLQ TENDERNESS NO PERITONITIS   Musculoskeletal: Normal range of motion.  Neurological: He is alert.  Skin: Skin is warm and dry.    Assessment/Plan: SIGMOID VOLVULUS WITHOUT PERITONITIS RECOMMEND GI CONSULT FOR ENDOSCOPY  WILL FOLLOW NO ACUTE SURGICAL NEED AT THIS POINT   Patient Active Problem List   Diagnosis Date Noted  . Venous stasis dermatitis of both lower extremities 05/26/2015  . Non-compliant behavior 04/27/2014  . Chronic a-fib ([place]) 04/08/2014  . Acute respiratory failure ([place]) 03/29/2014  . Prostate CA ([place]) 10/11/2013  . Elevated brain natriuretic peptide (BNP) level 10/11/2013  . Altered mental status 10/08/2013  . Atrial fibrillation ([place]) 11/21/2012  . Long term current use of anticoagulant therapy 11/21/2012  . Heme positive stool 06/29/2011  . Lower extremity venous stasis 06/28/2011  . Torticollis 06/28/2011  . CRI (chronic renal insufficiency) 06/28/2011  . HX: long term anticoagulant use 06/28/2011  . Anemia 05/20/2011  . A-fib ([place]) 05/19/2011  . GERD (gastroesophageal reflux disease) 05/19/2011  . BPH (benign prostatic hyperplasia) 05/19/2011  . Chronic systolic CHF (congestive heart failure) ([place]) 05/19/2011  WILL NEED CARDIAC EVALUATION FOR POSSIBLE  SIGMOID COLECTOMY AFTER DECOMPRESSION VERY DIFFICULT AIRWAY SO INTUBATION RISKY MAY NEED TO HOLD ANTICOAGULATION AT THIS POINT   Eduardo Deleon A. 04/10/2016, 6:49 PM

## 2016-04-10 NOTE — ED Triage Notes (Signed)
Per PTAR, pt from [facility]. Pt has not had a BM in several days and complains of LLQ abd pain with distention. VSS. Pt alert and oriented x 3. BP 138 palpated, hx of a-fib, HR 94, RR 16, 94% on RA.

## 2016-04-10 NOTE — Progress Notes (Signed)
New Admission Note:   Arrival Method: Stretcher from ED Mental Orientation: Alert and oriented,Very HOH Telemetry: N/A Assessment: Completed Skin: See doc flowsheet IV: LtFA Pain: Denies Tubes: N/A Safety Measures: Safety Fall Prevention Plan has been given, discussed. Admission: Completed [address] [place] Orientation: Patient has been orientated to the room, unit and staff.  Family: None at bedside  Orders have been reviewed and implemented. Will continue to monitor the patient. Call light has been placed within reach and bed alarm has been activated.   [employer], RN-BC Phone number: [phone]

## 2016-04-10 NOTE — Op Note (Signed)
[facility] Patient Name: Eduardo Deleon Procedure Date : 04/10/2016 MRN: [id number] Attending MD: Jerene Bears , MD Date of Birth: 01-10-1925 CSN: [id number] Age: 82 Admit Type: Outpatient Procedure:                Flexible Sigmoidoscopy Indications:              Generalized abdominal pain, Volvulus Providers:                Lajuan Lines. Hilarie Fredrickson, MD, Vista Lawman, RN, Alfonso Patten,                            Technician Referring MD:             [facility] Medicines:                None, Fentanyl 12.5 micrograms IV, Midazolam 1 mg IV Complications:            No immediate complications. Estimated Blood Loss:     Estimated blood loss: none. Procedure:                Pre-Anesthesia Assessment:                           - Prior to the procedure, a History and Physical                            was performed, and patient medications and                            allergies were reviewed. The patient's tolerance of                            previous anesthesia was also reviewed. The risks                            and benefits of the procedure and the sedation                            options and risks were discussed with the patient.                            All questions were answered, and informed consent                            was obtained. Prior Anticoagulants: The patient has                            taken Eliquis (apixaban), last dose was day of                            procedure. ASA Grade Assessment: III - A patient                            with severe systemic disease. After reviewing the  risks and benefits, the patient was deemed in                            satisfactory condition to undergo the procedure.                           After obtaining informed consent, the scope was                            passed under direct vision. The [id number]                            scope was introduced  through the anus and advanced                            to the the descending colon. The flexible                            sigmoidoscopy was accomplished without difficulty.                            The patient tolerated the procedure well. Scope In: Scope Out: Findings:      The digital rectal exam was normal.      A volvulus, with viable appearing, though mildly erythematous mucosa,       was found in the recto-sigmoid colon. Decompression of the volvulus was       attempted and was successful, with complete decompression achieved. No       mass lesions were seen. Stool, semi-solid and solid, was present in the       sigmoid colon which precluded complete visualization in the examined       segment, but little to no suspicion of malignancy.      The retroflexed view of the distal rectum and anal verge was       unremarkable. Impression:               - Volvulus at recto-sigmoid colon. Successful                            complete decompression achieved.                           - No specimens collected. Moderate Sedation:      Moderate (conscious) sedation was administered by the endoscopy nurse       and supervised by the endoscopist. The following parameters were       monitored: oxygen saturation, heart rate, blood pressure, and response       to care. Total physician intraservice time was 9 minutes. Recommendation:           - Admit the patient to hospital ward for ongoing                            care.                           - Full liquid diet.                           -  Continue present medications, but would hold                            Eliquis given possible surgical intervention.                           - General surgery is following. High risk for                            recurrence without sigmoidectomy.                           - GI available, call with questions. Procedure Code(s):        --- Professional ---                           [phone],  Sigmoidoscopy, flexible; with decompression                            (for pathologic distention) (eg, volvulus,                            megacolon), including placement of decompression                            tube, when performed                           G0500, Moderate sedation services provided by the                            same physician or other qualified health care                            professional performing a gastrointestinal                            endoscopic service that sedation supports,                            requiring the presence of an independent trained                            observer to assist in the monitoring of the                            patient's level of consciousness and physiological                            status; initial 15 minutes of intra-service time;                            patient age 42 years or older (additional time 47  be reported with [phone], as appropriate) Diagnosis Code(s):        --- Professional ---                           K56.2, Volvulus                           R10.84, Generalized abdominal pain CPT copyright 2016 [employer]. All rights reserved. The codes documented in this report are preliminary and upon coder review may  be revised to meet current compliance requirements. Jerene Bears, MD 04/10/2016 9:03:31 PM This report has been signed electronically. Number of Addenda: 0

## 2016-04-10 NOTE — ED Notes (Signed)
Patient transported to CT 

## 2016-04-10 NOTE — ED Notes (Signed)
CBG is 108. 

## 2016-04-10 NOTE — H&P (Signed)
History and Physical    Eduardo Deleon [id number] DOB: Mar 30, 1925 DOA: 04/10/2016   PCP: Gwendolyn Grant, MD Chief Complaint:  Chief Complaint  Patient presents with  . Abdominal Pain    HPI: Eduardo Deleon is a 82 y.o. male with medical history significant of A.fib on eliquis, chronic CHF recorded as systolic but has normal EF on last echo in 2015, severe torticollis.  Patient [facility] ED from [facility] with c/o abdominal pain.  3-4 days of worsening, LLQ abd pain and distention, last BM was 5 days ago.  Nothing makes better or worse, not passing flatus.  No h/o GIB.  Pain is severe and worsening.  ED Course: Found to have sigmoid volvulus, this has been decompressed endoscopically by Dr. Hilarie Fredrickson.  Patient being admitted to medicine for pre-op cardiac eval for possible sigmoid resection.  Review of Systems: As per HPI otherwise 10 point review of systems negative.    Past Medical History:  Diagnosis Date  . Anal fissure    Hx of  . Anemia   . Atrial fibrillation (HCC)    chronic anticaog - weintraub  . Congestive heart failure ([place])   . CRI (chronic renal insufficiency)   . Diverticulosis 04/12/1998   Left colon--Flex Sig  . GERD (gastroesophageal reflux disease)   . Internal hemorrhoids   . Osteoarthritis   . Prostate cancer ([place])   . PVD (peripheral vascular disease) ([place])   . Splenic lesion   . Stroke [facility]) 1978 lower brain stem  . Torticollis, acquired   . Tubular adenoma   . Venous stasis dermatitis    hx venous ulcer/cellulitis 06/2011 R and 04/2011 L    Past Surgical History:  Procedure Laterality Date  . COLONOSCOPY     1988  . DIRECT LARYNGOSCOPY N/A 03/31/2014   Procedure: DIRECT LARYNGOSCOPY/EXAM UNDER ANESTHESIA;  Surgeon: [name], MD;  Location: [facility];  Service: ENT;  Laterality: N/A;  . HEMORRHOID SURGERY    . HERNIA REPAIR     left-1982, right - 1985  . JOINT REPLACEMENT  bilaterial knees  . LUMBAR LAMINECTOMY  Per patient   heriated disc in mid spine  . TONSILLECTOMY       reports that he has never smoked. He has never used smokeless tobacco. He reports that he does not drink alcohol or use drugs.  No Known Allergies  Family History  Problem Relation Age of Onset  . Heart disease Father   . Breast cancer Mother   . Diabetes Brother   . Diabetes      grandmother  . Colon cancer Neg Hx       Prior to Admission medications   Medication Sig Start Date End Date Taking? Authorizing Provider  apixaban (ELIQUIS) 2.5 MG TABS tablet Take 1 tablet (2.5 mg total) by mouth 2 (two) times daily. 12/29/13  Yes Troy Sine, MD  cholecalciferol (VITAMIN D) 1000 UNITS tablet Take 2,000 Units by mouth daily.     Yes Historical Provider, MD  diltiazem (CARDIZEM CD) 180 MG 24 hr capsule Take 1 capsule (180 mg total) by mouth daily. 04/07/13  Yes Lorretta Harp, MD  doxazosin (CARDURA) 2 MG tablet Take 2 mg by mouth at bedtime.     Yes Historical Provider, MD  finasteride (PROSCAR) 5 MG tablet Take 5 mg by mouth daily.   Yes Historical Provider, MD  furosemide (LASIX) 20 MG tablet Take 20 mg by mouth daily.   Yes Historical Provider, MD  iron  polysaccharides (FERREX 150) 150 MG capsule Take 1 capsule (150 mg total) by mouth daily. 09/25/13  Yes Troy Sine, MD  magnesium oxide (MAG-OX) 400 MG tablet Take 400 mg by mouth daily.   Yes Historical Provider, MD  Multiple Vitamins-Minerals (PRESERVISION AREDS 2 PO) Take 1 capsule by mouth 2 (two) times daily.   Yes Historical Provider, MD  pantoprazole (PROTONIX) 40 MG tablet Take 1 tablet (40 mg total) by mouth daily. 04/13/14  Yes Charlynne Cousins, MD  polyethylene glycol [facility] / Floria Raveling) packet Take 17 g by mouth daily as needed for mild constipation, moderate constipation or severe constipation. 04/13/14  Yes Charlynne Cousins, MD  potassium chloride (KLOR-CON) 10 MEQ CR tablet Take 10 mEq by mouth daily.    Yes Historical Provider, MD    Physical Exam: Vitals:     04/10/16 2040 04/10/16 2045 04/10/16 2050 04/10/16 2149  BP: 97/60 99/64 119/81 98/78  Pulse: 87 104 93 67  Resp: 21 22 19 16   Temp:      TempSrc:      SpO2: 95% 96% 94% 95%  Weight:      Height:          Constitutional: NAD, calm, comfortable Eyes: PERRL, lids and conjunctivae normal ENMT: Mucous membranes are moist. Posterior pharynx clear of any exudate or lesions.Normal dentition.  Neck: Severe torticollis Respiratory: clear to auscultation bilaterally, no wheezing, no crackles. Normal respiratory effort. No accessory muscle use.  Cardiovascular: irr,irr  no murmurs / rubs / gallops. No extremity edema. 2+ pedal pulses. No carotid bruits.  Abdomen: minimal tenderness, no masses palpated. No hepatosplenomegaly. Bowel sounds positive. Distended Musculoskeletal: no clubbing / cyanosis. No joint deformity upper and lower extremities. Good ROM, no contractures. Normal muscle tone.  Skin: no rashes, lesions, ulcers. No induration Neurologic: CN 2-12 grossly intact. Sensation intact, DTR normal. Strength 5/5 in all 4.  Psychiatric: Normal judgment and insight. Alert and oriented x 3. Normal mood.    Labs on Admission: I have personally reviewed following labs and imaging studies  CBC:  Recent Labs Lab 04/10/16 1625  WBC 5.0  NEUTROABS 4.1  HGB 12.6*  HCT 38.7*  MCV 89.8  PLT 0000000   Basic Metabolic Panel:  Recent Labs Lab 04/10/16 1625  NA 142  K 3.4*  CL 106  CO2 29  GLUCOSE 132*  BUN 17  CREATININE 0.99  CALCIUM 8.7*   GFR: Estimated Creatinine Clearance: 45.6 mL/min (by C-G formula based on SCr of 0.99 mg/dL). Liver Function Tests:  Recent Labs Lab 04/10/16 1625  AST 18  ALT 12*  ALKPHOS 77  BILITOT 0.8  PROT 6.8  ALBUMIN 3.6    Recent Labs Lab 04/10/16 1625  LIPASE 26   No results for input(s): AMMONIA in the last 168 hours. Coagulation Profile: No results for input(s): INR, PROTIME in the last 168 hours. Cardiac Enzymes: No results for  input(s): CKTOTAL, CKMB, CKMBINDEX, TROPONINI in the last 168 hours. BNP (last 3 results) No results for input(s): PROBNP in the last 8760 hours. HbA1C: No results for input(s): HGBA1C in the last 72 hours. CBG:  Recent Labs Lab 04/10/16 2101  GLUCAP 108*   Lipid Profile: No results for input(s): CHOL, HDL, LDLCALC, TRIG, CHOLHDL, LDLDIRECT in the last 72 hours. Thyroid Function Tests: No results for input(s): TSH, T4TOTAL, FREET4, T3FREE, THYROIDAB in the last 72 hours. Anemia Panel: No results for input(s): VITAMINB12, FOLATE, FERRITIN, TIBC, IRON, RETICCTPCT in the last 72 hours. Urine analysis:  Component Value Date/Time   COLORURINE YELLOW 03/29/2014 1609   APPEARANCEUR CLEAR 03/29/2014 1609   LABSPEC 1.015 03/29/2014 1609   PHURINE 6.5 03/29/2014 1609   GLUCOSEU NEGATIVE 03/29/2014 1609   HGBUR NEGATIVE 03/29/2014 [place] 03/29/2014 [place] 03/29/2014 1609   PROTEINUR NEGATIVE 03/29/2014 1609   UROBILINOGEN 1.0 03/29/2014 1609   NITRITE NEGATIVE 03/29/2014 1609   LEUKOCYTESUR NEGATIVE 03/29/2014 1609   Sepsis Labs: @LABRCNTIP (procalcitonin:4,lacticidven:4) )No results found for this or any previous visit (from the past 240 hour(s)).   Radiological Exams on Admission: Ct Abdomen Pelvis W Contrast  Result Date: 04/10/2016 CLINICAL DATA:  Lower abdominal pain and distention.  Constipation. EXAM: CT ABDOMEN AND PELVIS WITH CONTRAST TECHNIQUE: Multidetector CT imaging of the abdomen and pelvis was performed using the standard protocol following bolus administration of intravenous contrast. CONTRAST:  113mL ISOVUE-300 IOPAMIDOL (ISOVUE-300) INJECTION 61% COMPARISON:  CT scan of December 03, 2007. FINDINGS: Lower chest: Minimal bibasilar subsegmental atelectasis is noted. Hepatobiliary: No gallstones are noted.  Normal liver. Pancreas: Fatty replacement of the pancreas is noted. Spleen: Stable hemangioma seen in the spleen. Adrenals/Urinary  Tract: Adrenal glands and kidneys appear normal. No hydronephrosis or renal obstruction is noted. Mild urinary bladder distention is noted. Stomach/Bowel: The appendix appears normal. There is noted volvulus involving the distal portion of sigmoid colon resulting in dilatation of the more proximal colon and a large amount of stool present. No small bowel dilatation is noted. Vascular/Lymphatic: Atherosclerosis of abdominal aorta is noted without aneurysm formation. No adenopathy is noted. Reproductive: Mildly enlarged prostate gland is noted with associated calcifications. Other: No abnormal fluid collection is noted. Musculoskeletal: Severe multilevel degenerative disc disease is noted in the thoracic and lumbar spine. IMPRESSION: Aortic atherosclerosis. Volvulus is seen involving the distal portion of the sigmoid colon resulting in significant obstruction. Critical Value/emergent results were called by telephone at the time of interpretation on 04/10/2016 at 6:18 pm to [place] , who verbally acknowledged these results. Electronically Signed   By: Marijo Conception, M.D.   On: 04/10/2016 18:18    EKG: Independently reviewed.  Assessment/Plan Principal Problem:   Sigmoid volvulus (HCC) Active Problems:   A-fib (HCC)   Chronic systolic CHF (congestive heart failure) (HCC)   Chronic a-fib (HCC)   Volvulus (HCC)    1. Sigmoid volvulus - s/p decompression by GI 1. Clear liquids then NPO after midnight 2. IVF and holding lasix 3. See. Dr. Vena Rua and Dr. Josetta Huddle notes 4. Call cards in AM for pre-op eval for possible sigmoid colectomy 5. More concerning than cardiac status to me however, is his very severe torticollis and likely will be very difficult intubation 2. Chronic systolic CHF - last EF was NL in 2015, checking 2d echo 3. Chronic A.Fib - rate controlled, holding eliquis   DVT prophylaxis: Holding eliquis Code Status: DNR - yellow form at bedside Family Communication:  none Consults called: Dr. Hilarie Fredrickson and Dr. Brantley Stage Admission status: Admit to inpatient   Etta Quill DO [facility] Pager [phone] from 7PM-7AM  If 7AM-7PM, please contact the day physician for the patient [email or web address] Password [email or web address]  04/10/2016, 9:57 PM

## 2016-04-10 NOTE — Consult Note (Signed)
Referring Provider: Harlene Ramus, PA-C [facility] ED) Primary Care Physician:  Gwendolyn Grant, MD Primary Gastroenterologist:  Dr. Olevia Perches  Reason for Consultation:  Sigmoid volvulus  HPI: Eduardo Deleon is a 82 y.o. male with past medical history of A. fib on Eliquis, CHF, CRI, history of prostate cancer, severe torticollis, colonic diverticulosis presenting to the ER from his skilled facility with abdominal pain. He reports 3-4 days of worsening lower, left greater than right abdominal pain and distention. No bowel movement or flatus for nearly 5 days. Does not report constipation issues recently. Denies previous bout instruction. Denies chest pain, shortness of breath, nausea and vomiting. Is not aware of any recent GI bleeding  In the ER he was noted to have distended abdomen with increased tympany. CT scan was performed which showed sigmoid volvulus causing obstruction. Surgery was consult at as was I for sigmoidoscopic decompression.  Last colonoscopy was with Dr. Olevia Perches in 2013. This was reviewed. It is available and the procedures tab   Past Medical History:  Diagnosis Date  . Anal fissure    Hx of  . Anemia   . Atrial fibrillation (HCC)    chronic anticaog - weintraub  . Congestive heart failure ([place])   . CRI (chronic renal insufficiency)   . Diverticulosis 04/12/1998   Left colon--Flex Sig  . GERD (gastroesophageal reflux disease)   . Internal hemorrhoids   . Osteoarthritis   . Prostate cancer ([place])   . PVD (peripheral vascular disease) ([place])   . Splenic lesion   . Stroke [facility]) 1978 lower brain stem  . Torticollis, acquired   . Tubular adenoma   . Venous stasis dermatitis    hx venous ulcer/cellulitis 06/2011 R and 04/2011 L    Past Surgical History:  Procedure Laterality Date  . COLONOSCOPY     1988  . DIRECT LARYNGOSCOPY N/A 03/31/2014   Procedure: DIRECT LARYNGOSCOPY/EXAM UNDER ANESTHESIA;  Surgeon: [name], MD;  Location: [place];  Service: ENT;   Laterality: N/A;  . HEMORRHOID SURGERY    . HERNIA REPAIR     left-1982, right - 1985  . JOINT REPLACEMENT  bilaterial knees  . LUMBAR LAMINECTOMY  Per patient  heriated disc in mid spine  . TONSILLECTOMY      Prior to Admission medications   Medication Sig Start Date End Date Taking? Authorizing Provider  apixaban (ELIQUIS) 2.5 MG TABS tablet Take 1 tablet (2.5 mg total) by mouth 2 (two) times daily. 12/29/13  Yes Troy Sine, MD  cholecalciferol (VITAMIN D) 1000 UNITS tablet Take 2,000 Units by mouth daily.     Yes Historical Provider, MD  diltiazem (CARDIZEM CD) 180 MG 24 hr capsule Take 1 capsule (180 mg total) by mouth daily. 04/07/13  Yes Lorretta Harp, MD  doxazosin (CARDURA) 2 MG tablet Take 2 mg by mouth at bedtime.     Yes Historical Provider, MD  finasteride (PROSCAR) 5 MG tablet Take 5 mg by mouth daily.   Yes Historical Provider, MD  furosemide (LASIX) 20 MG tablet Take 20 mg by mouth daily.   Yes Historical Provider, MD  iron polysaccharides (FERREX 150) 150 MG capsule Take 1 capsule (150 mg total) by mouth daily. 09/25/13  Yes Troy Sine, MD  magnesium oxide (MAG-OX) 400 MG tablet Take 400 mg by mouth daily.   Yes Historical Provider, MD  Multiple Vitamins-Minerals (PRESERVISION AREDS 2 PO) Take 1 capsule by mouth 2 (two) times daily.   Yes Historical Provider, MD  pantoprazole (  PROTONIX) 40 MG tablet Take 1 tablet (40 mg total) by mouth daily. 04/13/14  Yes Charlynne Cousins, MD  polyethylene glycol [employer] / Floria Raveling) packet Take 17 g by mouth daily as needed for mild constipation, moderate constipation or severe constipation. 04/13/14  Yes Charlynne Cousins, MD  potassium chloride (KLOR-CON) 10 MEQ CR tablet Take 10 mEq by mouth daily.    Yes Historical Provider, MD    Current Facility-Administered Medications  Medication Dose Route Frequency Provider Last Rate Last Dose  . 0.9 %  sodium chloride infusion   Intravenous Continuous Jerene Bears, MD 20 mL/hr at  04/10/16 2023     Current Outpatient Prescriptions  Medication Sig Dispense Refill  . apixaban (ELIQUIS) 2.5 MG TABS tablet Take 1 tablet (2.5 mg total) by mouth 2 (two) times daily. 180 tablet 2  . cholecalciferol (VITAMIN D) 1000 UNITS tablet Take 2,000 Units by mouth daily.      Marland Kitchen diltiazem (CARDIZEM CD) 180 MG 24 hr capsule Take 1 capsule (180 mg total) by mouth daily. 30 capsule 11  . doxazosin (CARDURA) 2 MG tablet Take 2 mg by mouth at bedtime.      . finasteride (PROSCAR) 5 MG tablet Take 5 mg by mouth daily.    . furosemide (LASIX) 20 MG tablet Take 20 mg by mouth daily.    . iron polysaccharides (FERREX 150) 150 MG capsule Take 1 capsule (150 mg total) by mouth daily. 30 capsule 5  . magnesium oxide (MAG-OX) 400 MG tablet Take 400 mg by mouth daily.    . Multiple Vitamins-Minerals (PRESERVISION AREDS 2 PO) Take 1 capsule by mouth 2 (two) times daily.    . pantoprazole (PROTONIX) 40 MG tablet Take 1 tablet (40 mg total) by mouth daily.    . polyethylene glycol (MIRALAX / GLYCOLAX) packet Take 17 g by mouth daily as needed for mild constipation, moderate constipation or severe constipation. 14 each 0  . potassium chloride (KLOR-CON) 10 MEQ CR tablet Take 10 mEq by mouth daily.       Allergies as of 04/10/2016  . (No Known Allergies)    Family History  Problem Relation Age of Onset  . Heart disease Father   . Breast cancer Mother   . Diabetes Brother   . Diabetes      grandmother  . Colon cancer Neg Hx     Social History   Social History  . Marital status: Married    Spouse name: N/A  . Number of children: N/A  . Years of education: N/A   Occupational History  . Not on file.   Social History Main Topics  . Smoking status: Never Smoker  . Smokeless tobacco: Never Used     Comment: married, wife in [place] since 2013  . Alcohol use No  . Drug use: No  . Sexual activity: Not on file   Other Topics Concern  . Not on file   Social History Narrative  . No narrative  on file    Review of Systems: As per HPI, otherwise negative  Physical Exam: Vital signs in last 24 hours: Temp:  [97.8 F (36.6 C)] 97.8 F (36.6 C) (10/17 1531) Pulse Rate:  [73-97] 94 (10/17 2000) Resp:  [17-24] 22 (10/17 2000) BP: (124-143)/(83-99) 136/99 (10/17 2000) SpO2:  [90 %-98 %] 96 % (10/17 2000) Weight:  [145 lb (65.8 kg)] 145 lb (65.8 kg) (10/17 2000)   Gen: awake, alert, NAD HEENT: anicteric, op clear, severe torticollis with  neck deviation to the left CV: irreg, irreg Pulm: CTA b/l Abd: firm and tense with tympany, mildly tender without peritoneal signs, bowel sounds present but hypoactive  Ext: no c/c/e Neuro: nonfocal   Intake/Output from previous day: No intake/output data recorded. Intake/Output this shift: No intake/output data recorded.  Lab Results:  Recent Labs  04/10/16 1625  WBC 5.0  HGB 12.6*  HCT 38.7*  PLT 162   BMET  Recent Labs  04/10/16 1625  NA 142  K 3.4*  CL 106  CO2 29  GLUCOSE 132*  BUN 17  CREATININE 0.99  CALCIUM 8.7*   LFT  Recent Labs  04/10/16 1625  PROT 6.8  ALBUMIN 3.6  AST 18  ALT 12*  ALKPHOS 77  BILITOT 0.8   PT/INR No results for input(s): LABPROT, INR in the last 72 hours. Hepatitis Panel No results for input(s): HEPBSAG, HCVAB, HEPAIGM, HEPBIGM in the last 72 hours.    Studies/Results: Ct Abdomen Pelvis W Contrast  Result Date: 04/10/2016 CLINICAL DATA:  Lower abdominal pain and distention.  Constipation. EXAM: CT ABDOMEN AND PELVIS WITH CONTRAST TECHNIQUE: Multidetector CT imaging of the abdomen and pelvis was performed using the standard protocol following bolus administration of intravenous contrast. CONTRAST:  126mL ISOVUE-300 IOPAMIDOL (ISOVUE-300) INJECTION 61% COMPARISON:  CT scan of December 03, 2007. FINDINGS: Lower chest: Minimal bibasilar subsegmental atelectasis is noted. Hepatobiliary: No gallstones are noted.  Normal liver. Pancreas: Fatty replacement of the pancreas is noted.  Spleen: Stable hemangioma seen in the spleen. Adrenals/Urinary Tract: Adrenal glands and kidneys appear normal. No hydronephrosis or renal obstruction is noted. Mild urinary bladder distention is noted. Stomach/Bowel: The appendix appears normal. There is noted volvulus involving the distal portion of sigmoid colon resulting in dilatation of the more proximal colon and a large amount of stool present. No small bowel dilatation is noted. Vascular/Lymphatic: Atherosclerosis of abdominal aorta is noted without aneurysm formation. No adenopathy is noted. Reproductive: Mildly enlarged prostate gland is noted with associated calcifications. Other: No abnormal fluid collection is noted. Musculoskeletal: Severe multilevel degenerative disc disease is noted in the thoracic and lumbar spine. IMPRESSION: Aortic atherosclerosis. Volvulus is seen involving the distal portion of the sigmoid colon resulting in significant obstruction. Critical Value/emergent results were called by telephone at the time of interpretation on 04/10/2016 at 6:18 pm to Ansel[place] , who verbally acknowledged these results. Electronically Signed   By: Marijo Conception, M.D.   On: 04/10/2016 18:18    IMPRESSION:  82 y.o. male with past medical history of A. fib on Eliquis, CHF, CRI, history of prostate cancer, severe torticollis, colonic diverticulosis presenting to the ER from his skilled facility with abdominal pain found to have sigmoid volvulus.   PLAN: 1. Sigmoid volvulus -- indication for emergent decompression with flexible sigmoidoscopy tonight. We'll perform at the bedside. I discussed this with the patient.The nature of the procedure, as well as the risks, benefits, and alternatives were carefully and thoroughly reviewed with the patient. Ample time for discussion and questions allowed. The patient understood, was satisfied, and agreed to proceed.  --Will be admitted to the hospitalist service. Surgery is following. High risk of  recurrence after decompression without sigmoid colectomy. Agree with holding anticoagulation.     Jerene Bears  04/10/2016, 8:24 PM  Pager number [phone]

## 2016-04-10 NOTE — ED Notes (Signed)
Pt was given apple juice per PA.

## 2016-04-10 NOTE — ED Notes (Signed)
Endo team at bedside to perform procedure.

## 2016-04-10 NOTE — ED Provider Notes (Signed)
[name] Note   CSN: [id number] Arrival date & time: 04/10/16  1524     History   Chief Complaint Chief Complaint  Patient presents with  . Abdominal Pain    HPI Eduardo Deleon is a 82 y.o. male.  Patient is a 82 year old male with history of A. fib (on L Oquist) sees, CVA, CHF, diverticulosis, chronic renal insufficiency, PVD and GERD who presents the ED via EMS from [place] with complaint of abdominal pain. Patient reports he has had constant waxing and waning sharp pain to his left lower quadrant with associated abdominal distention for the past 4-5 days. He also reports having constipation and notes his last bowel movement was approximately 5 days ago. Patient denies taking any medications for his constipation. Denies fever, chills, chest pain, difficulty breathing, nausea, vomiting, urinary symptoms. Abdominal surgical history of hernia repair.       Past Medical History:  Diagnosis Date  . Anal fissure    Hx of  . Anemia   . Atrial fibrillation (HCC)    chronic anticaog - weintraub  . Congestive heart failure ([place])   . CRI (chronic renal insufficiency)   . Diverticulosis 04/12/1998   Left colon--Flex Sig  . GERD (gastroesophageal reflux disease)   . Internal hemorrhoids   . Osteoarthritis   . Prostate cancer ([place])   . PVD (peripheral vascular disease) ([place])   . Splenic lesion   . Stroke [facility]) 1978 lower brain stem  . Torticollis, acquired   . Tubular adenoma   . Venous stasis dermatitis    hx venous ulcer/cellulitis 06/2011 R and 04/2011 L    Patient Active Problem List   Diagnosis Date Noted  . Pressure injury of skin 04/11/2016  . Volvulus ([place]) 04/10/2016  . Sigmoid volvulus ([place])   . Venous stasis dermatitis of both lower extremities 05/26/2015  . Non-compliant behavior 04/27/2014  . Chronic a-fib ([place]) 04/08/2014  . Acute respiratory failure ([place]) 03/29/2014  . Prostate CA ([place]) 10/11/2013  . Elevated brain natriuretic  peptide (BNP) level 10/11/2013  . Altered mental status 10/08/2013  . Atrial fibrillation ([place]) 11/21/2012  . Long term current use of anticoagulant therapy 11/21/2012  . Heme positive stool 06/29/2011  . Lower extremity venous stasis 06/28/2011  . Torticollis 06/28/2011  . CRI (chronic renal insufficiency) 06/28/2011  . HX: long term anticoagulant use 06/28/2011  . Anemia 05/20/2011  . A-fib ([place]) 05/19/2011  . GERD (gastroesophageal reflux disease) 05/19/2011  . BPH (benign prostatic hyperplasia) 05/19/2011  . Chronic systolic CHF (congestive heart failure) ([employer]) 05/19/2011    Past Surgical History:  Procedure Laterality Date  . COLONOSCOPY     1988  . DIRECT LARYNGOSCOPY N/A 03/31/2014   Procedure: DIRECT LARYNGOSCOPY/EXAM UNDER ANESTHESIA;  Surgeon: [name], MD;  Location: [facility];  Service: ENT;  Laterality: N/A;  . FLEXIBLE SIGMOIDOSCOPY N/A 04/10/2016   Procedure: FLEXIBLE SIGMOIDOSCOPY;  Surgeon: Jerene Bears, MD;  Location: [facility] ENDOSCOPY;  Service: Endoscopy;  Laterality: N/A;  . HEMORRHOID SURGERY    . HERNIA REPAIR     left-1982, right - 1985  . JOINT REPLACEMENT  bilaterial knees  . LUMBAR LAMINECTOMY  Per patient  heriated disc in mid spine  . TONSILLECTOMY         Home Medications    Prior to Admission medications   Medication Sig Start Date End Date Taking? Authorizing Provider  apixaban (ELIQUIS) 2.5 MG TABS tablet Take 1 tablet (2.5 mg total) by mouth 2 (two) times daily.  12/29/13  Yes Troy Sine, MD  cholecalciferol (VITAMIN D) 1000 UNITS tablet Take 2,000 Units by mouth daily.     Yes Historical Provider, MD  diltiazem (CARDIZEM CD) 180 MG 24 hr capsule Take 1 capsule (180 mg total) by mouth daily. 04/07/13  Yes Lorretta Harp, MD  doxazosin (CARDURA) 2 MG tablet Take 2 mg by mouth at bedtime.     Yes Historical Provider, MD  finasteride (PROSCAR) 5 MG tablet Take 5 mg by mouth daily.   Yes Historical Provider, MD  furosemide (LASIX) 20 MG tablet  Take 20 mg by mouth daily.   Yes Historical Provider, MD  iron polysaccharides (FERREX 150) 150 MG capsule Take 1 capsule (150 mg total) by mouth daily. 09/25/13  Yes Troy Sine, MD  magnesium oxide (MAG-OX) 400 MG tablet Take 400 mg by mouth daily.   Yes Historical Provider, MD  Multiple Vitamins-Minerals (PRESERVISION AREDS 2 PO) Take 1 capsule by mouth 2 (two) times daily.   Yes Historical Provider, MD  pantoprazole (PROTONIX) 40 MG tablet Take 1 tablet (40 mg total) by mouth daily. 04/13/14  Yes Charlynne Cousins, MD  polyethylene glycol [facility] / Floria Raveling) packet Take 17 g by mouth daily as needed for mild constipation, moderate constipation or severe constipation. 04/13/14  Yes Charlynne Cousins, MD  potassium chloride (KLOR-CON) 10 MEQ CR tablet Take 10 mEq by mouth daily.    Yes Historical Provider, MD    Family History Family History  Problem Relation Age of Onset  . Heart disease Father   . Breast cancer Mother   . Diabetes Brother   . Diabetes      grandmother  . Colon cancer Neg Hx     Social History Social History  Substance Use Topics  . Smoking status: Never Smoker  . Smokeless tobacco: Never Used     Comment: married, wife in [place] since 2013  . Alcohol use No     Allergies   Review of patient's allergies indicates no known allergies.   Review of Systems Review of Systems  Gastrointestinal: Positive for abdominal pain and constipation.  All other systems reviewed and are negative.    Physical Exam Updated Vital Signs BP (!) 114/59 (BP Location: Right Arm)   Pulse 76   Temp 98.2 F (36.8 C) (Oral)   Resp 18   Ht 5\' 6"  (1.676 m)   Wt 65.5 kg   SpO2 96%   BMI 23.31 kg/m   Physical Exam  Constitutional: He is oriented to person, place, and time. He appears well-developed and well-nourished. No distress.  Frail elderly appearing male who is hard of hearing.  HENT:  Head: Normocephalic and atraumatic.  Mouth/Throat: Oropharynx is clear and  moist. No oropharyngeal exudate.  Eyes: Conjunctivae and EOM are normal. Right eye exhibits no discharge. Left eye exhibits no discharge. No scleral icterus.  Neck: Normal range of motion. Neck supple.  Cardiovascular: Normal rate, normal heart sounds and intact distal pulses.   Irregularly irregular rhythm, HR 90  Pulmonary/Chest: Effort normal and breath sounds normal. No respiratory distress. He has no wheezes. He has no rales. He exhibits no tenderness.  Abdominal: Soft. Bowel sounds are normal. He exhibits distension. He exhibits no fluid wave, no ascites and no mass. There is tenderness (mild TTP over LLQ). There is no rigidity, no rebound and no guarding. No hernia.  Musculoskeletal: Normal range of motion. He exhibits no edema.  Neurological: He is alert and oriented to person,  place, and time.  Skin: Skin is warm and dry. He is not diaphoretic.  Nursing note and vitals reviewed.    ED Treatments / Results  Labs (all labs ordered are listed, but only abnormal results are displayed) Labs Reviewed  MRSA PCR SCREENING - Abnormal; Notable for the following:       Result Value   MRSA by PCR POSITIVE (*)    All other components within normal limits  CBC WITH DIFFERENTIAL/PLATELET - Abnormal; Notable for the following:    Hemoglobin 12.6 (*)    HCT 38.7 (*)    All other components within normal limits  COMPREHENSIVE METABOLIC PANEL - Abnormal; Notable for the following:    Potassium 3.4 (*)    Glucose, Bld 132 (*)    Calcium 8.7 (*)    ALT 12 (*)    All other components within normal limits  URINALYSIS, ROUTINE W REFLEX MICROSCOPIC (NOT AT [facility]) - Abnormal; Notable for the following:    Specific Gravity, Urine 1.040 (*)    All other components within normal limits  CBC - Abnormal; Notable for the following:    RBC 3.94 (*)    Hemoglobin 11.3 (*)    HCT 35.6 (*)    All other components within normal limits  BASIC METABOLIC PANEL - Abnormal; Notable for the following:     Potassium 2.9 (*)    Glucose, Bld 116 (*)    Calcium 8.3 (*)    All other components within normal limits  CBG MONITORING, ED - Abnormal; Notable for the following:    Glucose-Capillary 108 (*)    All other components within normal limits  LIPASE, BLOOD  I-STAT CG4 LACTIC ACID, ED  I-STAT CG4 LACTIC ACID, ED    EKG  EKG Interpretation  Date/Time:  Tuesday April 10 2016 17:03:28 EDT Ventricular Rate:  70 PR Interval:    QRS Duration: 104 QT Interval:  394 QTC Calculation: 410 R Axis:   114 Text Interpretation:  Sinus rhythm Ventricular premature complex Right axis deviation Low voltage, precordial leads Non-specific ST changes, unchanged from previous Baseline wander Otherwise no significant change Confirmed by ISAACS MD, CAMERON [phone]) on 04/10/2016 5:15:49 PM       Radiology Ct Abdomen Pelvis W Contrast  Result Date: 04/10/2016 CLINICAL DATA:  Lower abdominal pain and distention.  Constipation. EXAM: CT ABDOMEN AND PELVIS WITH CONTRAST TECHNIQUE: Multidetector CT imaging of the abdomen and pelvis was performed using the standard protocol following bolus administration of intravenous contrast. CONTRAST:  138mL ISOVUE-300 IOPAMIDOL (ISOVUE-300) INJECTION 61% COMPARISON:  CT scan of December 03, 2007. FINDINGS: Lower chest: Minimal bibasilar subsegmental atelectasis is noted. Hepatobiliary: No gallstones are noted.  Normal liver. Pancreas: Fatty replacement of the pancreas is noted. Spleen: Stable hemangioma seen in the spleen. Adrenals/Urinary Tract: Adrenal glands and kidneys appear normal. No hydronephrosis or renal obstruction is noted. Mild urinary bladder distention is noted. Stomach/Bowel: The appendix appears normal. There is noted volvulus involving the distal portion of sigmoid colon resulting in dilatation of the more proximal colon and a large amount of stool present. No small bowel dilatation is noted. Vascular/Lymphatic: Atherosclerosis of abdominal aorta is noted without  aneurysm formation. No adenopathy is noted. Reproductive: Mildly enlarged prostate gland is noted with associated calcifications. Other: No abnormal fluid collection is noted. Musculoskeletal: Severe multilevel degenerative disc disease is noted in the thoracic and lumbar spine. IMPRESSION: Aortic atherosclerosis. Volvulus is seen involving the distal portion of the sigmoid colon resulting in significant obstruction. Critical Value/emergent  results were called by telephone at the time of interpretation on 04/10/2016 at 6:18 pm to [place] , who verbally acknowledged these results. Electronically Signed   By: Marijo Conception, M.D.   On: 04/10/2016 18:18    Procedures Procedures (including critical care time)  Medications Ordered in ED Medications  doxazosin (CARDURA) tablet 2 mg (2 mg Oral Given 04/11/16 0025)  diltiazem (CARDIZEM CD) 24 hr capsule 180 mg (180 mg Oral Given 04/11/16 0937)  finasteride (PROSCAR) tablet 5 mg (5 mg Oral Given 04/11/16 0938)  pantoprazole (PROTONIX) EC tablet 40 mg (40 mg Oral Given 04/11/16 0938)  magnesium oxide (MAG-OX) tablet 400 mg (400 mg Oral Given 04/11/16 0938)  mupirocin ointment (BACTROBAN) 2 % 1 application (1 application Nasal Given 04/11/16 0938)  Chlorhexidine Gluconate Cloth 2 % PADS 6 each (not administered)  potassium chloride SA (K-DUR,KLOR-CON) CR tablet 40 mEq (40 mEq Oral Given 04/11/16 1436)  sodium chloride 0.9 % 1,000 mL with potassium chloride 40 mEq infusion ( Intravenous New Bag/Given 04/11/16 1437)  fentaNYL (SUBLIMAZE) injection 50 mcg (50 mcg Intravenous Given 04/10/16 1614)  sodium chloride 0.9 % bolus 500 mL (0 mLs Intravenous Stopped 04/10/16 1822)  iopamidol (ISOVUE-300) 61 % injection (100 mLs  Contrast Given 04/10/16 1752)     Initial Impression / Assessment and Plan / ED Course  I have reviewed the triage vital signs and the nursing notes.  Pertinent labs & imaging results that were available during my care of the  patient were reviewed by me and considered in my medical decision making (see chart for details).  Clinical Course    Pt presents with constipation and left lower abdominal pain for the past 5 days. Denies fever, nausea, vomiting. Patient is from nursing home. Hx of  A. fib (on L Oquist) sees, CVA, CHF, diverticulosis, chronic renal insufficiency, PVD and GERD. VSS. Exam revealed abdominal distention and mild tenderness over left lower quadrant. Remaining exam unremarkable. Potassium 3.4. Remaining labs unremarkable. CT abdomen revealed volvulus and distal portion of sigmoid colon with significant obstruction. Consult to GI who reports they will come to bedside to reduce volvulus.   Hand-off to [employer], PA-C. Dispo pending GI evaluation/bedside procedure.  Final Clinical Impressions(s) / ED Diagnoses   Final diagnoses:  Sigmoid volvulus [facility])    New Prescriptions Current Discharge Medication List       Nona Dell, [place] 04/11/16 1516    Duffy Bruce, MD 04/12/16 1459

## 2016-04-10 NOTE — Progress Notes (Signed)
Received report from ED RN. Room ready for patient. Timtohy Broski Joselita, RN 

## 2016-04-10 NOTE — ED Provider Notes (Signed)
Patient care signed out by previous provider pending GI evaluation. GI reduced volvulus here in the ED, patient has significant improvement in symptoms. Hospital admission with surgery closely following. Hospital service was consulted and agreed to hospital admission.   Okey Regal, PA-C 04/10/16 2227    Duffy Bruce, MD 04/12/16 [phone]

## 2016-04-11 ENCOUNTER — Inpatient Hospital Stay (HOSPITAL_COMMUNITY): Payer: Medicare Other

## 2016-04-11 ENCOUNTER — Encounter (HOSPITAL_COMMUNITY): Payer: Self-pay | Admitting: Internal Medicine

## 2016-04-11 DIAGNOSIS — L8990 Pressure ulcer of unspecified site, unspecified stage: Secondary | ICD-10-CM | POA: Insufficient documentation

## 2016-04-11 DIAGNOSIS — I482 Chronic atrial fibrillation: Secondary | ICD-10-CM

## 2016-04-11 DIAGNOSIS — I4891 Unspecified atrial fibrillation: Secondary | ICD-10-CM

## 2016-04-11 DIAGNOSIS — Z01810 Encounter for preprocedural cardiovascular examination: Secondary | ICD-10-CM

## 2016-04-11 DIAGNOSIS — E876 Hypokalemia: Secondary | ICD-10-CM

## 2016-04-11 DIAGNOSIS — I5032 Chronic diastolic (congestive) heart failure: Secondary | ICD-10-CM

## 2016-04-11 LAB — BASIC METABOLIC PANEL
Anion gap: 8 (ref 5–15)
BUN: 14 mg/dL (ref 6–20)
CALCIUM: 8.3 mg/dL — AB (ref 8.9–10.3)
CO2: 26 mmol/L (ref 22–32)
CREATININE: 0.99 mg/dL (ref 0.61–1.24)
Chloride: 107 mmol/L (ref 101–111)
GLUCOSE: 116 mg/dL — AB (ref 65–99)
Potassium: 2.9 mmol/L — ABNORMAL LOW (ref 3.5–5.1)
Sodium: 141 mmol/L (ref 135–145)

## 2016-04-11 LAB — URINALYSIS, ROUTINE W REFLEX MICROSCOPIC
Bilirubin Urine: NEGATIVE
Glucose, UA: NEGATIVE mg/dL
Hgb urine dipstick: NEGATIVE
KETONES UR: NEGATIVE mg/dL
LEUKOCYTES UA: NEGATIVE
NITRITE: NEGATIVE
PH: 5.5 (ref 5.0–8.0)
Protein, ur: NEGATIVE mg/dL
Specific Gravity, Urine: 1.04 — ABNORMAL HIGH (ref 1.005–1.030)

## 2016-04-11 LAB — CBC
HEMATOCRIT: 35.6 % — AB (ref 39.0–52.0)
Hemoglobin: 11.3 g/dL — ABNORMAL LOW (ref 13.0–17.0)
MCH: 28.7 pg (ref 26.0–34.0)
MCHC: 31.7 g/dL (ref 30.0–36.0)
MCV: 90.4 fL (ref 78.0–100.0)
PLATELETS: 150 10*3/uL (ref 150–400)
RBC: 3.94 MIL/uL — ABNORMAL LOW (ref 4.22–5.81)
RDW: 15.4 % (ref 11.5–15.5)
WBC: 4.8 10*3/uL (ref 4.0–10.5)

## 2016-04-11 LAB — MRSA PCR SCREENING: MRSA by PCR: POSITIVE — AB

## 2016-04-11 LAB — ECHOCARDIOGRAM COMPLETE
HEIGHTINCHES: 66 in
Weight: 2310.42 oz

## 2016-04-11 MED ORDER — CHLORHEXIDINE GLUCONATE CLOTH 2 % EX PADS
6.0000 | MEDICATED_PAD | Freq: Every day | CUTANEOUS | Status: DC
  Administered 2016-04-12: 6 via TOPICAL

## 2016-04-11 MED ORDER — SODIUM CHLORIDE 0.9 % IV SOLN
INTRAVENOUS | Status: DC
  Administered 2016-04-11: 15:00:00 via INTRAVENOUS
  Filled 2016-04-11 (×3): qty 1000

## 2016-04-11 MED ORDER — MUPIROCIN 2 % EX OINT
1.0000 "application " | TOPICAL_OINTMENT | Freq: Two times a day (BID) | CUTANEOUS | Status: DC
  Administered 2016-04-11 – 2016-04-12 (×3): 1 via NASAL
  Filled 2016-04-11 (×2): qty 22

## 2016-04-11 MED ORDER — POTASSIUM CHLORIDE CRYS ER 20 MEQ PO TBCR
40.0000 meq | EXTENDED_RELEASE_TABLET | Freq: Two times a day (BID) | ORAL | Status: DC
  Administered 2016-04-11: 40 meq via ORAL
  Filled 2016-04-11: qty 2

## 2016-04-11 MED ORDER — POTASSIUM CHLORIDE CRYS ER 20 MEQ PO TBCR
40.0000 meq | EXTENDED_RELEASE_TABLET | Freq: Two times a day (BID) | ORAL | Status: DC
  Administered 2016-04-11 – 2016-04-12 (×2): 40 meq via ORAL
  Filled 2016-04-11 (×2): qty 2

## 2016-04-11 NOTE — Progress Notes (Signed)
Patient ID: Eduardo Deleon, male   DOB: [birth year], 82 y.o.   MRN: [id number]  [facility] Progress Note  1 Day Post-Op  Subjective: Feeling much better since decompression. Denies abdominal pain, bloating, nausea, or vomiting. Passed a little gas, no BM.   Objective: Vital signs in last 24 hours: Temp:  [97.6 F (36.4 C)-98.6 F (37 C)] 98.2 F (36.8 C) (10/18 0900) Pulse Rate:  [63-112] 76 (10/18 0900) Resp:  [16-25] 18 (10/18 0900) BP: (96-143)/(59-99) 114/59 (10/18 0900) SpO2:  [90 %-100 %] 96 % (10/18 0900) Weight:  [144 lb 6.4 oz (65.5 kg)-145 lb (65.8 kg)] 144 lb 6.4 oz (65.5 kg) (10/17 2358)    Intake/Output from previous day: 10/17 0701 - 10/18 0700 In: 917.5 [I.V.:417.5; IV Piggyback:500] Out: 0  Intake/Output this shift: Total I/O In: 360 [P.O.:360] Out: 501 [Urine:500; Stool:1]  PE: Gen:  Alert, NAD, pleasant Pulm:  Effort normal Abd: Soft, mildly distended, NT, +BS  Lab Results:   Recent Labs  04/10/16 1625 04/11/16 0652  WBC 5.0 4.8  HGB 12.6* 11.3*  HCT 38.7* 35.6*  PLT 162 150   BMET  Recent Labs  04/10/16 1625 04/11/16 0652  NA 142 141  K 3.4* 2.9*  CL 106 107  CO2 29 26  GLUCOSE 132* 116*  BUN 17 14  CREATININE 0.99 0.99  CALCIUM 8.7* 8.3*   PT/INR No results for input(s): LABPROT, INR in the last 72 hours. CMP     Component Value Date/Time   NA 141 04/11/2016 0652   K 2.9 (L) 04/11/2016 0652   CL 107 04/11/2016 0652   CO2 26 04/11/2016 0652   GLUCOSE 116 (H) 04/11/2016 0652   BUN 14 04/11/2016 0652   CREATININE 0.99 04/11/2016 0652   CREATININE 1.14 03/23/2013 1648   CALCIUM 8.3 (L) 04/11/2016 0652   PROT 6.8 04/10/2016 1625   ALBUMIN 3.6 04/10/2016 1625   AST 18 04/10/2016 1625   ALT 12 (L) 04/10/2016 1625   ALKPHOS 77 04/10/2016 1625   BILITOT 0.8 04/10/2016 1625   GFRNONAA >60 04/11/2016 0652   GFRAA >60 04/11/2016 0652   Lipase     Component Value Date/Time   LIPASE 26 04/10/2016 1625        Studies/Results: Ct Abdomen Pelvis W Contrast  Result Date: 04/10/2016 CLINICAL DATA:  Lower abdominal pain and distention.  Constipation. EXAM: CT ABDOMEN AND PELVIS WITH CONTRAST TECHNIQUE: Multidetector CT imaging of the abdomen and pelvis was performed using the standard protocol following bolus administration of intravenous contrast. CONTRAST:  181mL ISOVUE-300 IOPAMIDOL (ISOVUE-300) INJECTION 61% COMPARISON:  CT scan of December 03, 2007. FINDINGS: Lower chest: Minimal bibasilar subsegmental atelectasis is noted. Hepatobiliary: No gallstones are noted.  Normal liver. Pancreas: Fatty replacement of the pancreas is noted. Spleen: Stable hemangioma seen in the spleen. Adrenals/Urinary Tract: Adrenal glands and kidneys appear normal. No hydronephrosis or renal obstruction is noted. Mild urinary bladder distention is noted. Stomach/Bowel: The appendix appears normal. There is noted volvulus involving the distal portion of sigmoid colon resulting in dilatation of the more proximal colon and a large amount of stool present. No small bowel dilatation is noted. Vascular/Lymphatic: Atherosclerosis of abdominal aorta is noted without aneurysm formation. No adenopathy is noted. Reproductive: Mildly enlarged prostate gland is noted with associated calcifications. Other: No abnormal fluid collection is noted. Musculoskeletal: Severe multilevel degenerative disc disease is noted in the thoracic and lumbar spine. IMPRESSION: Aortic atherosclerosis. Volvulus is seen involving the distal portion of the sigmoid colon  resulting in significant obstruction. Critical Value/emergent results were called by telephone at the time of interpretation on 04/10/2016 at 6:18 pm to [place] , who verbally acknowledged these results. Electronically Signed   By: Marijo Conception, M.D.   On: 04/10/2016 18:18    Anti-infectives: Anti-infectives    None       Assessment/Plan Sigmoid volvulus - first occurrence - s/p  decompression with flexible sigmoidoscopy yesterday by Dr. Hilarie Fredrickson - cardiology to see for preop clearance, Pending repeat echo - holding eliquis. Needs to be off eliquis for 3 days prior to any surgical intervention - need to discuss with anesthesia as patient is difficult airway due to torticollis  - patient states his wife lives in SNF with him, has no other family close by - +flatus since decompression  Hypokalemia - will add K to IVF as patient is NPO A. fib -   on Eliquis CHF CRI history of prostate cancer severe torticollis colonic diverticulosis  Hard of hearing  FEN - clears, IVF VTE - holding eliquis (patient unsure when last dose was, may have been yesterday - 04/10/2016)  Plan - discussed possibility of surgical intervention but at this time patient would prefer to "hope for the best" and proceed with nonoperative management.   He knows the risk of recurrence, and states that he will return to hospital and consider surgery if it does recur. He is passing gas and has good BS's.   Continue clears, will consult PT for mobilization.   LOS: 1 day    Jerrye Beavers , [facility] 04/11/2016, 10:51 AM Pager: [phone] Consults: [phone] Mon-Fri 7:00 am-4:30 pm Sat-Sun 7:00 am-11:30 am  Agree with above.  He is very hard to talk to, because of his deafness.  Abdomen distended, but non tender.  [name], MD, [facility] Surgery Pager: [phone] Office phone:  [phone]

## 2016-04-11 NOTE — Progress Notes (Signed)
  Echocardiogram 2D Echocardiogram has been performed.  Eduardo Deleon 04/11/2016, 3:14 PM

## 2016-04-11 NOTE — Consult Note (Signed)
CARDIOLOGY CONSULT NOTE   Patient ID: Eduardo Deleon MRN: [id number] DOB/AGE: 80-27-1926 80 y.o.  Admit date: 04/10/2016  Primary Physician   Gwendolyn Grant, MD Primary Cardiologist   Dr. Claiborne Billings Reason for Consultation   Pre-op clearance Requesting Physician  Dr. Dyann Kief  HPI: Eduardo Deleon is a 82 y.o. male with a history of permanent afib on Eliquis for anticoagulation, PVD, stroke, who presented to ER for evaluation of abdominal pain.   He is a former patient of Dr. 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 He has a history of sick sinus syndrome. In 2009 he underwent cardioversion for atrial fibrillation and subsequently went back into atrial fibrillation.   He has been in permanent atrial fibrillation since and was on chronic Coumadin therapy. It was changed to Eliquis 09/2013 due to not able to get Coumadin blood checks. He had undergoneLE Doppler study in May 2016 which demonstrated a resting right ankle-brachial index greater than 1.54 indicating the presence of tibial artery medial calcification.  The waveform was strongly triphasic and was without evidence for arterial  insufficiency.  The left ankle brachial index was 1.26 which was normal. He was doing well on cardiac stand point when last seen by Dr. Claiborne Billings 05/2015. Advised to follow-up as needed.  Last echocardiogram 09/2013 showed left ventricular function of 60-65%, no wall motion abnormality, or significant valvular abnormality. Severe bilateral atrial enlargement.  Resides at [place] with wife. He is hard to hear. Uses hearing aid however currently not with him. Presented to ER last night for abdominal pain for the past 3-4 days with worsening symptoms and distention. Workup revealed a sigmoid volvulus followed by the decompression endoscopically. Plan for possible sigmoid resection. Etiology hospital for preoperative clearance.  Patient has limited mobility due to chronic issue. Denies any chest  pain, shortness of breath, palpitations, lower extremity edema, orthopnea, PND or syncope. Symptoms last dose of Eliquis 04/10/16 a.m.   Past Medical History:  Diagnosis Date  . Anal fissure    Hx of  . Anemia   . Atrial fibrillation (HCC)    chronic anticaog - weintraub  . Congestive heart failure ([place])   . CRI (chronic renal insufficiency)   . Diverticulosis 04/12/1998   Left colon--Flex Sig  . GERD (gastroesophageal reflux disease)   . Internal hemorrhoids   . Osteoarthritis   . Prostate cancer ([place])   . PVD (peripheral vascular disease) ([place])   . Splenic lesion   . Stroke [facility]) 1978 lower brain stem  . Torticollis, acquired   . Tubular adenoma   . Venous stasis dermatitis    hx venous ulcer/cellulitis 06/2011 R and 04/2011 L     Past Surgical History:  Procedure Laterality Date  . COLONOSCOPY     1988  . DIRECT LARYNGOSCOPY N/A 03/31/2014   Procedure: DIRECT LARYNGOSCOPY/EXAM UNDER ANESTHESIA;  Surgeon: [name], MD;  Location: [place];  Service: ENT;  Laterality: N/A;  . FLEXIBLE SIGMOIDOSCOPY N/A 04/10/2016   Procedure: FLEXIBLE SIGMOIDOSCOPY;  Surgeon: Jerene Bears, MD;  Location: [facility] ENDOSCOPY;  Service: Endoscopy;  Laterality: N/A;  . HEMORRHOID SURGERY    . HERNIA REPAIR     left-1982, right - 1985  . JOINT REPLACEMENT  bilaterial knees  . LUMBAR LAMINECTOMY  Per patient  heriated disc in mid spine  . TONSILLECTOMY      No Known Allergies  I have reviewed the patient's current medications . Chlorhexidine Gluconate Cloth  6 each Topical Q0600  .  diltiazem  180 mg Oral Daily  . doxazosin  2 mg Oral QHS  . finasteride  5 mg Oral Daily  . magnesium oxide  400 mg Oral Daily  . mupirocin ointment  1 application Nasal BID  . pantoprazole  40 mg Oral Daily  . potassium chloride  40 mEq Oral BID   . sodium chloride 75 mL/hr at 04/11/16 0026     Prior to Admission medications   Medication Sig Start Date End Date Taking? Authorizing Provider  apixaban  (ELIQUIS) 2.5 MG TABS tablet Take 1 tablet (2.5 mg total) by mouth 2 (two) times daily. 12/29/13  Yes Troy Sine, MD  cholecalciferol (VITAMIN D) 1000 UNITS tablet Take 2,000 Units by mouth daily.     Yes Historical Provider, MD  diltiazem (CARDIZEM CD) 180 MG 24 hr capsule Take 1 capsule (180 mg total) by mouth daily. 04/07/13  Yes Lorretta Harp, MD  doxazosin (CARDURA) 2 MG tablet Take 2 mg by mouth at bedtime.     Yes Historical Provider, MD  finasteride (PROSCAR) 5 MG tablet Take 5 mg by mouth daily.   Yes Historical Provider, MD  furosemide (LASIX) 20 MG tablet Take 20 mg by mouth daily.   Yes Historical Provider, MD  iron polysaccharides (FERREX 150) 150 MG capsule Take 1 capsule (150 mg total) by mouth daily. 09/25/13  Yes Troy Sine, MD  magnesium oxide (MAG-OX) 400 MG tablet Take 400 mg by mouth daily.   Yes Historical Provider, MD  Multiple Vitamins-Minerals (PRESERVISION AREDS 2 PO) Take 1 capsule by mouth 2 (two) times daily.   Yes Historical Provider, MD  pantoprazole (PROTONIX) 40 MG tablet Take 1 tablet (40 mg total) by mouth daily. 04/13/14  Yes Charlynne Cousins, MD  polyethylene glycol [facility] Floria Raveling) packet Take 17 g by mouth daily as needed for mild constipation, moderate constipation or severe constipation. 04/13/14  Yes Charlynne Cousins, MD  potassium chloride (KLOR-CON) 10 MEQ CR tablet Take 10 mEq by mouth daily.    Yes Historical Provider, MD     Social History   Social History  . Marital status: Married    Spouse name: N/A  . Number of children: N/A  . Years of education: N/A   Occupational History  . Not on file.   Social History Main Topics  . Smoking status: Never Smoker  . Smokeless tobacco: Never Used     Comment: married, wife in [place] since 2013  . Alcohol use No  . Drug use: No  . Sexual activity: Not on file   Other Topics Concern  . Not on file   Social History Narrative  . No narrative on file    Family Status  Relation Status   . Father Deceased at age 76  . Mother Deceased at age 36  . Maternal Grandmother Deceased  . Maternal Grandfather Deceased  . Paternal Grandmother Deceased  . Paternal Grandfather Deceased  . Brother Deceased  . Brother   .    Marland Kitchen Neg Hx    Family History  Problem Relation Age of Onset  . Heart disease Father   . Breast cancer Mother   . Diabetes Brother   . Diabetes      grandmother  . Colon cancer Neg Hx        ROS:  Full 14 point review of systems complete and found to be negative unless listed above.  Physical Exam: Blood pressure 102/62, pulse 63, temperature 98.6 F (  37 C), temperature source Oral, resp. rate 17, height 5\' 6"  (1.676 m), weight 144 lb 6.4 oz (65.5 kg), SpO2 95 %.  General: Well developed, well nourished, male in no acute distress Head: Eyes PERRLA, No xanthomas. Severe torticollis Lungs: Resp regular and unlabored, CTA. Heart:IR IR  no s3, s4, or murmurs..   Neck: No carotid bruits. No lymphadenopathy.  JVD. Abdomen: Minimally distended Msk:  No spine or cva tenderness. No weakness, no joint deformities or effusions. Extremities: No clubbing, cyanosis or edema. DP/PT/Radials 2+ and equal bilaterally. Neuro: Alert and oriented X 3. No focal deficits noted. Psych:  Good affect, responds appropriately Skin: No rashes or lesions noted.  Labs:   Lab Results  Component Value Date   WBC 4.8 04/11/2016   HGB 11.3 (L) 04/11/2016   HCT 35.6 (L) 04/11/2016   MCV 90.4 04/11/2016   PLT 150 04/11/2016   No results for input(s): INR in the last 72 hours.  Recent Labs Lab 04/10/16 1625 04/11/16 0652  NA 142 141  K 3.4* 2.9*  CL 106 107  CO2 29 26  BUN 17 14  CREATININE 0.99 0.99  CALCIUM 8.7* 8.3*  PROT 6.8  --   BILITOT 0.8  --   ALKPHOS 77  --   ALT 12*  --   AST 18  --   GLUCOSE 132* 116*  ALBUMIN 3.6  --    Magnesium  Date Value Ref Range Status  04/09/2014 2.0 1.5 - 2.5 mg/dL Final   No results for input(s): CKTOTAL, CKMB, TROPONINI  in the last 72 hours. No results for input(s): TROPIPOC in the last 72 hours. Pro B Natriuretic peptide (BNP)  Date/Time Value Ref Range Status  10/09/2013 06:50 PM 2,371.0 (H) 0 - 450 pg/mL Final  03/01/2010 06:19 PM 38.7 0.0 - 100.0 pg/mL Final   No results found for: CHOL, HDL, LDLCALC, TRIG No results found for: DDIMER Lipase  Date/Time Value Ref Range Status  04/10/2016 04:25 PM 26 11 - 51 U/L Final   TSH  Date/Time Value Ref Range Status  10/09/2013 06:50 PM 2.940 0.350 - 4.500 uIU/mL Final    Comment:    Please note change in reference range. Performed at [facility]   Vitamin B-12  Date/Time Value Ref Range Status  10/12/2013 04:20 AM 405 211 - 911 pg/mL Final    Comment:    Performed at [facility]   Folate  Date/Time Value Ref Range Status  05/20/2011 01:52 PM >20.0 ng/mL Final    Comment:    (NOTE) Reference Ranges        Deficient:       0.4 - 3.3 ng/mL        Indeterminate:   3.4 - 5.4 ng/mL        Normal:              > 5.4 ng/mL   Ferritin  Date/Time Value Ref Range Status  05/20/2011 01:52 PM 19 (L) 22 - 322 ng/mL Final   TIBC  Date/Time Value Ref Range Status  05/20/2011 01:52 PM 345 215 - 435 ug/dL Final   Iron  Date/Time Value Ref Range Status  05/20/2011 01:52 PM 19 (L) 42 - 135 ug/dL Final   Retic Ct Pct  Date/Time Value Ref Range Status  05/20/2011 01:52 PM 1.1 0.4 - 3.1 % Final    Echo: 09/2013 LV EF: 60% -  65%  ------------------------------------------------------------ Indications:   Atrial fibrillation - 427.31.  ------------------------------------------------------------ History:  PMH: Long Term Anticoagulant Use. Elevated BP. Altered mental status. Congestive heart failure.  ------------------------------------------------------------ Study Conclusions  - Left ventricle: The cavity size was normal. Wall thickness was normal. Systolic function was normal. The estimated ejection fraction was in  the range of 60% to 65%. Indeterminant diastolic function (atrial fibrillation). Wall motion was normal; there were no regional wall motion abnormalities. - Aortic valve: Trileaflet; moderately calcified leaflets. There was no stenosis. - Mitral valve: Mildly calcified annulus. Mild regurgitation. - Left atrium: The atrium was severely dilated. - Right ventricle: The cavity size was normal. Systolic function was normal. - Right atrium: The atrium was severely dilated. - Tricuspid valve: Peak RV-RA gradient: 57mm Hg (S). - Systemic veins: IVC not visualized. Impressions:  - The patient was in atrial fibrillation. Normal LV size and systolic function, EF [id number]. Normal RV size and systolic function. No significant valvular abnormalities. Severe biatrial enlargement.  ECG:  afib rate rate of 70 bpm, however read as sinus rhythm 04/10/16  Radiology:  Ct Abdomen Pelvis W Contrast  Result Date: 04/10/2016 CLINICAL DATA:  Lower abdominal pain and distention.  Constipation. EXAM: CT ABDOMEN AND PELVIS WITH CONTRAST TECHNIQUE: Multidetector CT imaging of the abdomen and pelvis was performed using the standard protocol following bolus administration of intravenous contrast. CONTRAST:  164mL ISOVUE-300 IOPAMIDOL (ISOVUE-300) INJECTION 61% COMPARISON:  CT scan of December 03, 2007. FINDINGS: Lower chest: Minimal bibasilar subsegmental atelectasis is noted. Hepatobiliary: No gallstones are noted.  Normal liver. Pancreas: Fatty replacement of the pancreas is noted. Spleen: Stable hemangioma seen in the spleen. Adrenals/Urinary Tract: Adrenal glands and kidneys appear normal. No hydronephrosis or renal obstruction is noted. Mild urinary bladder distention is noted. Stomach/Bowel: The appendix appears normal. There is noted volvulus involving the distal portion of sigmoid colon resulting in dilatation of the more proximal colon and a large amount of stool present. No small bowel dilatation is  noted. Vascular/Lymphatic: Atherosclerosis of abdominal aorta is noted without aneurysm formation. No adenopathy is noted. Reproductive: Mildly enlarged prostate gland is noted with associated calcifications. Other: No abnormal fluid collection is noted. Musculoskeletal: Severe multilevel degenerative disc disease is noted in the thoracic and lumbar spine. IMPRESSION: Aortic atherosclerosis. Volvulus is seen involving the distal portion of the sigmoid colon resulting in significant obstruction. Critical Value/emergent results were called by telephone at the time of interpretation on 04/10/2016 at 6:18 pm to [place] , who verbally acknowledged these results. Electronically Signed   By: Marijo Conception, M.D.   On: 04/10/2016 18:18    ASSESSMENT AND PLAN:     1. Permanent atrial fibrillation - CHADSVASCs score of 5 (age, CHF and stroke). Eliquis was held for possible surgery. Last dose a.m. off 04/10/69. Rate stable on Cardizem CD 180 mg. Yesterday EKG had artifact. Will repeat today.  2. Pre-op clearance  - He underwent decompressed endoscopically last night. Plan for sigmoid resection. His last echo 09/2013 showed left ventricular function of 60-65%, no wall motion abnormality, or significant valvular abnormality. Severe bilateral atrial enlargement. Pending repeat echo. Limited to mobility. Denies any cardiac symptoms. M.D. to see.   SignedLeanor Kail, [place] 04/11/2016, 10:03 AM Pager [phone]  Co-Sign MD

## 2016-04-11 NOTE — Progress Notes (Signed)
[facility] PROGRESS NOTE  Eduardo Deleon [id number] DOB: 1924-10-19 DOA: 04/10/2016 PCP: Gwendolyn Grant, MD  Interim summary and HPI 82 y.o. male with medical history significant of A.fib on eliquis, chronic CHF recorded as systolic but has normal EF on last echo in 2015, severe torticollis.  Patient presents to the ED from SNF with c/o abdominal pain.  3-4 days of worsening, LLQ abd pain and distention, last BM was 5 days ago.  Nothing makes better or worse, not passing flatus.  No h/o GIB.  Pain is severe and worsening.  ED Course: Found to have sigmoid volvulus, this has been decompressed endoscopically by Dr. Hilarie Fredrickson.  Patient being admitted to medicine for pre-op cardiac eval for possible sigmoid resection.  Assessment/Plan: 1-Sigmoid volvulus: patient presented with abd pain, nausea and vomiting. -symptoms improved after sigmoidoscopy yesterday -still no BM -tolerating CLD -CCS following; patient will like to attempt non-surgical intervention if possible -continue holding anticoagulation just in case  2-hypokalemia: -will continue repletion  -follow electrolytes trend  3-atrial fibrillation -will monitor on telemetry -CHADsVASC score 5 -on eliquis at home; currently hold due to potential surgery needs -continue cardizem  4-hx of severe torticollis  -chronic and unchanged  -will monitor  5-pre-operative clearance -echo repeated and no wall motion abnormalities  -EF preserved  -reasonable cardiac risk for surgery if needed   6-chronic diastolic HF -compensated currently -will follow daily weight and strict intake/output   Code Status: DNR Family Communication: no family at bedside Disposition Plan: to be determine; cardiology clearing patient for surgery; GI has performed decompression of volvulus on 10/17; patient wanting if possible to avoid surgical intervention. Continue non surgical approach at this  moment.   Consultants:  CCS  GI  Cardiology service   Procedures:  Sigmoidoscopy 10/17 (for volvulus decompression)   Echo: - Left ventricle: The cavity size was normal. Wall thickness was   increased in a pattern of mild LVH. Systolic function was normal.   The estimated ejection fraction was in the range of 50% to 55%.   Doppler parameters are consistent with both elevated ventricular   end-diastolic filling pressure and elevated left atrial filling   pressure. - Mitral valve: There was mild regurgitation. - Left atrium: The atrium was mildly dilated. - Right atrium: The atrium was mildly dilated. - Tricuspid valve: There was mild-moderate regurgitation. - Pulmonary arteries: PA peak pressure: 40 mm Hg (S).  Antibiotics:  None   HPI/Subjective: Feeling much better today; denies ab pain, nausea, vomiting and CP/SOB. Patient w/o BM, but passing gas.   Objective: Vitals:   04/11/16 0900 04/11/16 1716  BP: (!) 114/59 125/64  Pulse: 76 81  Resp: 18 18  Temp: 98.2 F (36.8 C) 98 F (36.7 C)    Intake/Output Summary (Last 24 hours) at 04/11/16 1919 Last data filed at 04/11/16 1718  Gross per 24 hour  Intake          1196.25 ml  Output             1052 ml  Net           144.25 ml   Filed Weights   04/10/16 1532 04/10/16 2000 04/10/16 2358  Weight: 65.8 kg (145 lb) 65.8 kg (145 lb) 65.5 kg (144 lb 6.4 oz)    Exam:   General:  Afebrile, no CP, denies ab pain. Patient w/o BM, but reports passing gas  Cardiovascular: rate controlled, soft SEM, no rubs or gallops  Respiratory: good air movement, no wheezing,  no crackles  Abdomen: soft, NT, positive BS; mild distension appreciated   Musculoskeletal: no edema, no cyanosis   Data Reviewed: Basic Metabolic Panel:  Recent Labs Lab 04/10/16 1625 04/11/16 0652  NA 142 141  K 3.4* 2.9*  CL 106 107  CO2 29 26  GLUCOSE 132* 116*  BUN 17 14  CREATININE 0.99 0.99  CALCIUM 8.7* 8.3*   Liver Function  Tests:  Recent Labs Lab 04/10/16 1625  AST 18  ALT 12*  ALKPHOS 77  BILITOT 0.8  PROT 6.8  ALBUMIN 3.6    Recent Labs Lab 04/10/16 1625  LIPASE 26   CBC:  Recent Labs Lab 04/10/16 1625 04/11/16 0652  WBC 5.0 4.8  NEUTROABS 4.1  --   HGB 12.6* 11.3*  HCT 38.7* 35.6*  MCV 89.8 90.4  PLT 162 150   CBG:  Recent Labs Lab 04/10/16 2101  GLUCAP 108*    Recent Results (from the past 240 hour(s))  MRSA PCR Screening     Status: Abnormal   Collection Time: 04/11/16  1:24 AM  Result Value Ref Range Status   MRSA by PCR POSITIVE (A) NEGATIVE Final    Comment:        The GeneXpert MRSA Assay (FDA approved for NASAL specimens only), is one component of a comprehensive MRSA colonization surveillance program. It is not intended to diagnose MRSA infection nor to guide or monitor treatment for MRSA infections. RESULT CALLED TO, READ BACK BY AND VERIFIED WITH: C BOKIAGON,RN @0349  04/11/16 MKELLY,MLT      Studies: Ct Abdomen Pelvis W Contrast  Result Date: 04/10/2016 CLINICAL DATA:  Lower abdominal pain and distention.  Constipation. EXAM: CT ABDOMEN AND PELVIS WITH CONTRAST TECHNIQUE: Multidetector CT imaging of the abdomen and pelvis was performed using the standard protocol following bolus administration of intravenous contrast. CONTRAST:  155mL ISOVUE-300 IOPAMIDOL (ISOVUE-300) INJECTION 61% COMPARISON:  CT scan of December 03, 2007. FINDINGS: Lower chest: Minimal bibasilar subsegmental atelectasis is noted. Hepatobiliary: No gallstones are noted.  Normal liver. Pancreas: Fatty replacement of the pancreas is noted. Spleen: Stable hemangioma seen in the spleen. Adrenals/Urinary Tract: Adrenal glands and kidneys appear normal. No hydronephrosis or renal obstruction is noted. Mild urinary bladder distention is noted. Stomach/Bowel: The appendix appears normal. There is noted volvulus involving the distal portion of sigmoid colon resulting in dilatation of the more proximal  colon and a large amount of stool present. No small bowel dilatation is noted. Vascular/Lymphatic: Atherosclerosis of abdominal aorta is noted without aneurysm formation. No adenopathy is noted. Reproductive: Mildly enlarged prostate gland is noted with associated calcifications. Other: No abnormal fluid collection is noted. Musculoskeletal: Severe multilevel degenerative disc disease is noted in the thoracic and lumbar spine. IMPRESSION: Aortic atherosclerosis. Volvulus is seen involving the distal portion of the sigmoid colon resulting in significant obstruction. Critical Value/emergent results were called by telephone at the time of interpretation on 04/10/2016 at 6:18 pm to Allen , who verbally acknowledged these results. Electronically Signed   By: Marijo Conception, M.D.   On: 04/10/2016 18:18    Scheduled Meds: . Chlorhexidine Gluconate Cloth  6 each Topical Q0600  . diltiazem  180 mg Oral Daily  . doxazosin  2 mg Oral QHS  . finasteride  5 mg Oral Daily  . magnesium oxide  400 mg Oral Daily  . mupirocin ointment  1 application Nasal BID  . pantoprazole  40 mg Oral Daily  . potassium chloride  40 mEq Oral BID   Continuous  Infusions: . sodium chloride 0.9 % 1,000 mL with potassium chloride 40 mEq infusion 75 mL/hr at 04/11/16 1437    Principal Problem:   Sigmoid volvulus (HCC) Active Problems:   A-fib (HCC)   Chronic systolic CHF (congestive heart failure) (HCC)   Chronic a-fib (HCC)   Volvulus (HCC)   Pressure injury of skin    Time spent: 30 minutes    Barton Dubois  [facility] Pager [phone]. If 7PM-7AM, please contact night-coverage at [email or web address], password [facility] 04/11/2016, 7:19 PM  LOS: 1 day

## 2016-04-12 DIAGNOSIS — L89151 Pressure ulcer of sacral region, stage 1: Secondary | ICD-10-CM

## 2016-04-12 LAB — BASIC METABOLIC PANEL
Anion gap: 5 (ref 5–15)
BUN: 10 mg/dL (ref 6–20)
CALCIUM: 8.4 mg/dL — AB (ref 8.9–10.3)
CO2: 26 mmol/L (ref 22–32)
CREATININE: 0.78 mg/dL (ref 0.61–1.24)
Chloride: 111 mmol/L (ref 101–111)
GFR calc non Af Amer: >60 mL/min (ref >60)
Glucose, Bld: 87 mg/dL (ref 65–99)
Potassium: 4.2 mmol/L (ref 3.5–5.1)
Sodium: 142 mmol/L (ref 135–145)

## 2016-04-12 LAB — MAGNESIUM: MAGNESIUM: 2.2 mg/dL (ref 1.7–2.4)

## 2016-04-12 MED ORDER — APIXABAN 5 MG PO TABS
5.0000 mg | ORAL_TABLET | Freq: Two times a day (BID) | ORAL | Status: AC
Start: 2016-04-12 — End: ?

## 2016-04-12 MED ORDER — POTASSIUM CHLORIDE IN NACL 40-0.9 MEQ/L-% IV SOLN
INTRAVENOUS | Status: DC
  Administered 2016-04-12: 75 mL/h via INTRAVENOUS
  Filled 2016-04-12 (×2): qty 1000

## 2016-04-12 MED ORDER — POLYETHYLENE GLYCOL 3350 17 G PO PACK: 17.0000 g | PACK | Freq: Every day | ORAL | Status: AC

## 2016-04-12 NOTE — Progress Notes (Signed)
Patient medically stable for discharge back to [facility] today.  Left message on voicemail for family friend, Ms. Workman.  Discharge clinicals will be transmitted to facility and patient will be transported by ambulance.    Linward Headland, CSW Intern

## 2016-04-12 NOTE — Evaluation (Signed)
Physical Therapy Evaluation Patient Details Name: Eduardo Deleon MRN: [id number] DOB: 11-Feb-1925 Today's Date: 04/12/2016   History of Present Illness  82 y/o found to have Sigmoid volvulus - S/P endoscopic reduction. PMH includes CVA, A fib, CHF, IBS, GERD, torticollis, scoliosis  Clinical Impression  Pt admitted with above diagnosis. Pt currently with functional limitations due to the deficits listed below (see PT Problem List).  Pt will benefit from skilled PT to increase their independence and safety with mobility to allow discharge to the venue listed below.  Pt moved very well to EOB with only MIN A at very end of transfer to get fully to EOB.  Pt then decided he didn't want to get up to recliner and returned supine.  Pt educated on importance of OOB, but he is nervous about having BM.  Education also limited at times by [facility] Recommend returning to [facility].     Follow Up Recommendations SNF;Supervision for mobility/OOB    Equipment Recommendations  None recommended by PT    Recommendations for Other Services       Precautions / Restrictions Precautions Precautions: Fall      Mobility  Bed Mobility Overal bed mobility: Needs Assistance Bed Mobility: Supine to Sit     Supine to sit: Min assist     General bed mobility comments: Pt needed MIN A at very end of transfer only.  He completed most of transfer with S.  Once EOB, he felt like he was straining too hard and declined OOB due to feeling like he would strain and have BM. Pt quickly returned legs to bed.  Transfers                    Ambulation/Gait                Stairs            Wheelchair Mobility    Modified Rankin (Stroke Patients Only)       Balance Overall balance assessment: Needs assistance   Sitting balance-Leahy Scale: Fair                                       Pertinent Vitals/Pain Pain Assessment: No/denies pain    Home Living Family/patient  expects to be discharged to:: Skilled nursing facility                      Prior Function           Comments: Propels w/c with feet around SNF. S to MIN/guard for be d> w/c transfer. Pt states he used to be able to do it I'ly, but then facility removed all of the bed rails and now he cannot do it I'ly     Hand Dominance   Dominant Hand: Right    Extremity/Trunk Assessment                      Cervical / Trunk Assessment: Other exceptions  Communication   Communication: HOH  Cognition Arousal/Alertness: Awake/alert Behavior During Therapy: WFL for tasks assessed/performed Overall Cognitive Status: Within Functional Limits for tasks assessed                      General Comments      Exercises     Assessment/Plan    PT Assessment Patient needs continued PT services  PT Problem List Decreased activity tolerance;Decreased balance;Decreased mobility          PT Treatment Interventions Gait training;Functional mobility training;Therapeutic activities;Therapeutic exercise    PT Goals (Current goals can be found in the Care Plan section)  Acute Rehab PT Goals Patient Stated Goal: to return to [facility] PT Goal Formulation: With patient Time For Goal Achievement: 04/19/16 Potential to Achieve Goals: Good    Frequency Min 3X/week   Barriers to discharge        Co-evaluation               End of Session   Activity Tolerance: Patient limited by fatigue Patient left: in bed;with bed alarm set Nurse Communication: Mobility status         Time: [id number] PT Time Calculation (min) (ACUTE ONLY): 20 min   Charges:   PT Evaluation $PT Eval Low Complexity: 1 Procedure     PT G Codes:        Delta Pichon LUBECK 04/12/2016, 11:39 AM

## 2016-04-12 NOTE — Clinical Social Work Note (Cosign Needed)
Clinical Social Work Assessment  Patient Details  Name: Eduardo Deleon MRN: [id number] Date of Birth: 1925-04-08  Date of referral:  04/12/16               Reason for consult:  Discharge Planning                Permission sought to share information with:  Family Supports Permission granted to share information::  Yes, Verbal Permission Granted (Ms. Rachell Cipro [phone])  Name::     Ms. Rachell Cipro  Agency::     Relationship::  Family Friend  Contact Information:  Ms. Rachell Cipro [phone]  Housing/Transportation Living arrangements for the past 2 months:  [place] of Information:  Patient Patient Interpreter Needed:  None Criminal Activity/Legal Involvement Pertinent to Current Situation/Hospitalization:  No - Comment as needed Significant Relationships:  Spouse (Markee Lienemann (wife)) Lives with:  Spouse, Other (Comment) (Patient and wife are residents of [facility]) Do you feel safe going back to the place where you live?  Yes Need for family participation in patient care:  Yes (Comment)  Care giving concerns:  No care giving concerns identified   Facilities manager / plan:  CSW Intern spoke with patient concerning discharge plans.  Patient and his wife Kyndrick Adames are residents at [facility] and share the same room.  CSW Intern confirmed with patient that his plan is to return to [facility].  Patient appeared excited about his possible discharge back to the facility today.  Patient stated that he does not currently wear glasses, however he does have double vision out of left eye and will be evaluated for lenses after discharge.  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  Patient reported that he does wear hearing aids, but did not bring them to the hospital.  Patient provided history regarding his [place] background and service in the [place] [employer], as well as  his employment with the [employer] and the [employer]. Mr. Sidberry gave CSW Interm permission to speak to family friend, Ms. Workman regarding to his discharge.  Patient verbalized understanding and was appreciative of CSW Intern's involvement with discharge planning needs.    Employment status:  Retired Forensic scientist:  Information systems manager, Other (Comment Required) (Secondary:  [employer]) PT Recommendations:  [place] / Referral to community resources:  Other (Comment Required) (None given:  Patient is a resident of [facility] and will return upon discharge. )  Patient/Family's Response to care:  Patient reported no concerns regarding care during hospitalization.   Patient/Family's Understanding of and Emotional Response to Diagnosis, Current Treatment, and Prognosis:  Not discussed  Emotional Assessment Appearance:  Appears stated age Attitude/Demeanor/Rapport:  Other (Pleasant, talkative) Affect (typically observed):  Adaptable, Appropriate, Pleasant Orientation:  Oriented to Self, Oriented to Place, Oriented to  Time, Oriented to Situation Alcohol / Substance use:  Not Applicable Psych involvement (Current and /or in the community):  No (Comment)  Discharge Needs  Concerns to be addressed:  Discharge Planning Concerns Readmission within the last 30 days:  No Current discharge risk:  None Barriers to Discharge:  No Barriers Identified   Linward Headland, [place] Work 04/12/2016, 12:35 PM

## 2016-04-12 NOTE — NC FL2 (Signed)
[place] LEVEL OF CARE SCREENING TOOL     IDENTIFICATION  Patient Name: Eduardo Deleon Birthdate: May 11, 1925 Sex: male Admission Date (Current Location): 04/10/2016  [facility] [place] Number:  Herbalist and Address:  [facility] [place], [place] [address], [place], [place] [address]      Provider Number: [id number]  Attending Physician Name and Address:  Barton Dubois, MD  Relative Name and Phone Number:  Dennard Schaumann (Other) - [phone]; Thos Zinter (Spouse) - [phone]    Current Level of Care: Hospital Recommended Level of Care: [place] Prior Approval Number:    Date Approved/Denied:   PASRR Number: [id number]   Discharge Plan: SNF    Current Diagnoses: Patient Active Problem List   Diagnosis Date Noted  . Pressure injury of skin 04/11/2016  . Chronic diastolic HF (heart failure) ([place])   . Hypokalemia   . Volvulus ([place]) 04/10/2016  . Sigmoid volvulus ([place])   . Venous stasis dermatitis of both lower extremities 05/26/2015  . Non-compliant behavior 04/27/2014  . Chronic a-fib ([place]) 04/08/2014  . Acute respiratory failure ([place]) 03/29/2014  . Prostate CA ([place]) 10/11/2013  . Elevated brain natriuretic peptide (BNP) level 10/11/2013  . Altered mental status 10/08/2013  . Atrial fibrillation ([place]) 11/21/2012  . Long term current use of anticoagulant therapy 11/21/2012  . Heme positive stool 06/29/2011  . Lower extremity venous stasis 06/28/2011  . Torticollis 06/28/2011  . CRI (chronic renal insufficiency) 06/28/2011  . HX: long term anticoagulant use 06/28/2011  . Anemia 05/20/2011  . A-fib ([place]) 05/19/2011  . GERD (gastroesophageal reflux disease) 05/19/2011  . BPH (benign prostatic hyperplasia) 05/19/2011  . Chronic systolic CHF (congestive heart failure) ([place]) 05/19/2011    Orientation RESPIRATION BLADDER Height & Weight     Self, Time, Situation, Place  Normal Incontinent Weight:  144 lb 6.4 oz (65.5 kg) Height:  5\' 6"  (167.6 cm)  BEHAVIORAL SYMPTOMS/MOOD NEUROLOGICAL BOWEL NUTRITION STATUS      Continent Diet (Clear Liquid)  AMBULATORY STATUS COMMUNICATION OF NEEDS Skin     Maximum Assist Verbally Other (Comment) (Stage 1 pressure ulcer on right buttock; Fungal/Yeast on perineum/bilateral groin)                       Personal Care Assistance Level of Assistance  Bathing, Feeding, Dressing Bathing Assistance: Maximum assistance Feeding assistance: Limited assistance Dressing Assistance: Maximum assistance     Functional Limitations Info  Sight, Speech Sight Info: Adequate Hearing Info: Impaired (wears hearing aids) Speech Info: Adequate    SPECIAL CARE FACTORS FREQUENCY   PT (by licenced PT  PT Frequency: Evaluated 10/19 and a minimum of 3x per week therapy recommended                  Contractures Contractures Info: Present    Additional Factors Info  Code Status, Allergies Code Status Info: DNR Allergies Info: NKDA           Current Medications (04/12/2016):  This is the current hospital active medication list Current Facility-Administered Medications  Medication Dose Route Frequency Provider Last Rate Last Dose  . 0.9 % NaCl with KCl 40 mEq / L  infusion   Intravenous Continuous Barton Dubois, MD 75 mL/hr at 04/12/16 0420 75 mL/hr at 04/12/16 0420  . Chlorhexidine Gluconate Cloth 2 % PADS 6 each  6 each Topical Q0600 Etta Quill, DO   6 each at 04/12/16 0600  . diltiazem (CARDIZEM  CD) 24 hr capsule 180 mg  180 mg Oral Daily Etta Quill, DO   180 mg at 04/12/16 1030  . doxazosin (CARDURA) tablet 2 mg  2 mg Oral QHS Etta Quill, DO   2 mg at 04/11/16 2141  . finasteride (PROSCAR) tablet 5 mg  5 mg Oral Daily Etta Quill, DO   5 mg at 04/12/16 1030  . magnesium oxide (MAG-OX) tablet 400 mg  400 mg Oral Daily Etta Quill, DO   400 mg at 04/12/16 1030  . mupirocin ointment (BACTROBAN) 2 % 1 application  1 application  Nasal BID Etta Quill, DO   1 application at [id number]  . pantoprazole (PROTONIX) EC tablet 40 mg  40 mg Oral Daily Etta Quill, DO   40 mg at 04/12/16 1030  . potassium chloride SA (K-DUR,KLOR-CON) CR tablet 40 mEq  40 mEq Oral BID Barton Dubois, MD   40 mEq at 04/12/16 1030     Discharge Medications: Please see discharge summary for a list of discharge medications.  Relevant Imaging Results:  Relevant Lab Results:   Additional Information SSN:  [id number]  Lajoyce Lauber Work [phone]

## 2016-04-12 NOTE — Clinical Social Work Note (Signed)
Patient medically stable for discharge and will return to [facility]. Discharge clinicals transmitted to facility and patient will be transported by ambulance. Patient's [name], [phone]) contacted and informed of discharge.  Taitum Menton Givens, MSW, LCSW Licensed Clinical Social Worker [place] [phone]

## 2016-04-12 NOTE — Progress Notes (Signed)
Physician Discharge Summary  Eduardo Deleon [id number] DOB: [birth year] DOA: 04/10/2016  PCP: Gwendolyn Grant, MD  Admit date: 04/10/2016 Discharge date: 04/12/2016  Time spent: 35 minutes  Recommendations for Outpatient Follow-up:  1. Repeat BMET to follow electrolytes and renal function  2. Follow resolution of symptoms and if needed refer to general surgery office for elective procedure.   Discharge Diagnoses:  Principal Problem:   Sigmoid volvulus ([place]) Active Problems:   A-fib ([place])   Chronic systolic CHF (congestive heart failure) (HCC)   Chronic a-fib (HCC)   Volvulus (HCC)   Pressure injury of skin   Chronic diastolic HF (heart failure) (HCC)   Hypokalemia   Discharge Condition: stable and improved. Discharge to SNF for further care and rehabilitation. Follow up with PCP in 10 days.  Diet recommendation: full liquid diet for 2 days; then low residue/heart healthy diet.  Filed Weights   04/10/16 1532 04/10/16 2000 04/10/16 2358  Weight: 65.8 kg (145 lb) 65.8 kg (145 lb) 65.5 kg (144 lb 6.4 oz)    History of present illness:  As per Dr. Alcario Drought H&P written  on 04/10/16 82 y.o.malewith medical history significant of A.fib on eliquis, chronic CHF recorded as systolic but has normal EF on last echo in 2015, severe torticollis. Patient presents to the ED from SNF with c/o abdominal pain. 3-4 days of worsening, LLQ abd pain and distention, last BM was 5 days ago. Nothing makes better or worse, not passing flatus. No h/o GIB. Pain is severe and worsening.  ED Course:Found to have sigmoid volvulus, this has been decompressed endoscopically by Dr. Hilarie Fredrickson. Patient being admitted to medicine for pre-op cardiac eval for possible sigmoid resection.  Hospital Course:  1-Sigmoid volvulus: patient presented with abd pain, nausea and vomiting. -symptoms improved after sigmoidoscopy yesterday -patient now stable and tolerating full liquid diet -he understand high  likelihood of recurrence condition; but will like to avoid invasive procedure at this time -ok to discharge back to SNF as per [facility] rec's  -will resume anticoagulation at discharge   2-hypokalemia: -replted -will recommend BMET in 10 days to follow up electrolytes and renal function   3-atrial fibrillation -will monitor on telemetry -CHADsVASC score 5 -on eliquis at home; resumed and dose adjusted as per cardiology rec's -continue cardizem  4-hx of severe torticollis  -chronic and unchanged  -PT recommending SNF for further care and rehab  5-pre-operative clearance -echo repeated and no wall motion abnormalities  -EF preserved  -reasonable cardiac risk for surgery if needed   6-chronic diastolic HF -compensated currently -will recommend close follow up of daily weight and low sodium diet  -resume home medication regimen   7-BPH: -will continue cardura and proscar   Procedures:  Sigmoidoscopy 10/17 (for volvulus decompression)   Echo: - Left ventricle: The cavity size was normal. Wall thickness was increased in a pattern of mild LVH. Systolic function was normal. The estimated ejection fraction was in the range of 50% to 55%. Doppler parameters are consistent with both elevated ventricular end-diastolic filling pressure and elevated left atrial filling pressure. - Mitral valve: There was mild regurgitation. - Left atrium: The atrium was mildly dilated. - Right atrium: The atrium was mildly dilated. - Tricuspid valve: There was mild-moderate regurgitation. - Pulmonary arteries: PA peak pressure: 40 mm Hg (S).  Consultations:  GI  CCS  Cardiology   Discharge Exam: Vitals:   04/12/16 0427 04/12/16 0926  BP: 128/78 (!) 132/95  Pulse: 81 87  Resp: 20 18  Temp:  99.1 F (37.3 C) 97.8 F (36.6 C)    General:  Afebrile, no CP, denies ab pain. Patient with 4 BM's now and passing gas. No nausea, no vomiting and tolerating diet.  Cardiovascular:  rate controlled, soft SEM, no rubs or gallops  Respiratory: good air movement, no wheezing, no crackles  Abdomen: soft, NT, positive BS; mild distension appreciated   Musculoskeletal: no edema, no cyanosis    Discharge Instructions   Discharge Instructions    (HEART FAILURE PATIENTS) Call MD:  Anytime you have any of the following symptoms: 1) 3 pound weight gain in 24 hours or 5 pounds in 1 week 2) shortness of breath, with or without a dry hacking cough 3) swelling in the hands, feet or stomach 4) if you have to sleep on extra pillows at night in order to breathe.    Complete by:  As directed    Discharge instructions    Complete by:  As directed    Full liquid diet for 2 days Maintain adequate hydration Advance diet to low residue diet on 04-14-16 Follow up with PCP in 10 days Take medications as prescribed     Current Discharge Medication List    CONTINUE these medications which have CHANGED   Details  apixaban (ELIQUIS) 5 MG TABS tablet Take 1 tablet (5 mg total) by mouth 2 (two) times daily.    polyethylene glycol (MIRALAX / GLYCOLAX) packet Take 17 g by mouth daily.      CONTINUE these medications which have NOT CHANGED   Details  cholecalciferol (VITAMIN D) 1000 UNITS tablet Take 2,000 Units by mouth daily.      diltiazem (CARDIZEM CD) 180 MG 24 hr capsule Take 1 capsule (180 mg total) by mouth daily. Qty: 30 capsule, Refills: 11    doxazosin (CARDURA) 2 MG tablet Take 2 mg by mouth at bedtime.      finasteride (PROSCAR) 5 MG tablet Take 5 mg by mouth daily.    furosemide (LASIX) 20 MG tablet Take 20 mg by mouth daily.    iron polysaccharides (FERREX 150) 150 MG capsule Take 1 capsule (150 mg total) by mouth daily. Qty: 30 capsule, Refills: 5    magnesium oxide (MAG-OX) 400 MG tablet Take 400 mg by mouth daily.    Multiple Vitamins-Minerals (PRESERVISION AREDS 2 PO) Take 1 capsule by mouth 2 (two) times daily.    pantoprazole (PROTONIX) 40 MG tablet Take 1  tablet (40 mg total) by mouth daily.    potassium chloride (KLOR-CON) 10 MEQ CR tablet Take 10 mEq by mouth daily.        No Known Allergies Follow-up Information    Gwendolyn Grant, MD. Schedule an appointment as soon as possible for a visit in 10 day(s).   Specialty:  Internal Medicine Contact information: [address] [place] [address] [phone]            The results of significant diagnostics from this hospitalization (including imaging, microbiology, ancillary and laboratory) are listed below for reference.    Significant Diagnostic Studies: Ct Abdomen Pelvis W Contrast  Result Date: 04/10/2016 CLINICAL DATA:  Lower abdominal pain and distention.  Constipation. EXAM: CT ABDOMEN AND PELVIS WITH CONTRAST TECHNIQUE: Multidetector CT imaging of the abdomen and pelvis was performed using the standard protocol following bolus administration of intravenous contrast. CONTRAST:  163mL ISOVUE-300 IOPAMIDOL (ISOVUE-300) INJECTION 61% COMPARISON:  CT scan of December 03, 2007. FINDINGS: Lower chest: Minimal bibasilar subsegmental atelectasis is noted. Hepatobiliary: No gallstones are  noted.  Normal liver. Pancreas: Fatty replacement of the pancreas is noted. Spleen: Stable hemangioma seen in the spleen. Adrenals/Urinary Tract: Adrenal glands and kidneys appear normal. No hydronephrosis or renal obstruction is noted. Mild urinary bladder distention is noted. Stomach/Bowel: The appendix appears normal. There is noted volvulus involving the distal portion of sigmoid colon resulting in dilatation of the more proximal colon and a large amount of stool present. No small bowel dilatation is noted. Vascular/Lymphatic: Atherosclerosis of abdominal aorta is noted without aneurysm formation. No adenopathy is noted. Reproductive: Mildly enlarged prostate gland is noted with associated calcifications. Other: No abnormal fluid collection is noted. Musculoskeletal: Severe multilevel degenerative disc  disease is noted in the thoracic and lumbar spine. IMPRESSION: Aortic atherosclerosis. Volvulus is seen involving the distal portion of the sigmoid colon resulting in significant obstruction. Critical Value/emergent results were called by telephone at the time of interpretation on 04/10/2016 at 6:18 pm to [place] , who verbally acknowledged these results. Electronically Signed   By: Marijo Conception, M.D.   On: 04/10/2016 18:18    Microbiology: Recent Results (from the past 240 hour(s))  MRSA PCR Screening     Status: Abnormal   Collection Time: 04/11/16  1:24 AM  Result Value Ref Range Status   MRSA by PCR POSITIVE (A) NEGATIVE Final    Comment:        The GeneXpert MRSA Assay (FDA approved for NASAL specimens only), is one component of a comprehensive MRSA colonization surveillance program. It is not intended to diagnose MRSA infection nor to guide or monitor treatment for MRSA infections. RESULT CALLED TO, READ BACK BY AND VERIFIED WITH: C BOKIAGON,RN @0349  04/11/16 MKELLY,MLT      Labs: Basic Metabolic Panel:  Recent Labs Lab 04/10/16 1625 04/11/16 0652 04/12/16 0538  NA 142 141 142  K 3.4* 2.9* 4.2  CL 106 107 111  CO2 29 26 26   GLUCOSE 132* 116* 87  BUN 17 14 10   CREATININE 0.99 0.99 0.78  CALCIUM 8.7* 8.3* 8.4*  MG  --   --  2.2   Liver Function Tests:  Recent Labs Lab 04/10/16 1625  AST 18  ALT 12*  ALKPHOS 77  BILITOT 0.8  PROT 6.8  ALBUMIN 3.6    Recent Labs Lab 04/10/16 1625  LIPASE 26   CBC:  Recent Labs Lab 04/10/16 1625 04/11/16 0652  WBC 5.0 4.8  NEUTROABS 4.1  --   HGB 12.6* 11.3*  HCT 38.7* 35.6*  MCV 89.8 90.4  PLT 162 150   CBG:  Recent Labs Lab 04/10/16 2101  GLUCAP 108*   Signed:  Barton Dubois MD.  [facility] 04/12/2016, 2:45 PM

## 2016-04-12 NOTE — Progress Notes (Signed)
2 Days Post-Op  Subjective: 4 BMs, no abdominal pain  Objective: Vital signs in last 24 hours: Temp:  [97.5 F (36.4 C)-99.1 F (37.3 C)] 97.8 F (36.6 C) (10/19 0926) Pulse Rate:  [81-93] 87 (10/19 0926) Resp:  [18-20] 18 (10/19 0926) BP: (119-132)/(64-95) 132/95 (10/19 0926) SpO2:  [94 %-98 %] 98 % (10/19 0926) Last BM Date: 04/11/16  Intake/Output from previous day: 10/18 0701 - 10/19 0700 In: 907.5 [P.O.:600; I.V.:307.5] Out: 1352 [Urine:1350; Stool:2] Intake/Output this shift: Total I/O In: 240 [P.O.:240] Out: -   Resp: clear to auscultation bilaterally GI: soft, distended, NT  Lab Results:   Recent Labs  04/10/16 1625 04/11/16 0652  WBC 5.0 4.8  HGB 12.6* 11.3*  HCT 38.7* 35.6*  PLT 162 150   BMET  Recent Labs  04/11/16 0652 04/12/16 0538  NA 141 142  K 2.9* 4.2  CL 107 111  CO2 26 26  GLUCOSE 116* 87  BUN 14 10  CREATININE 0.99 0.78  CALCIUM 8.3* 8.4*   PT/INR No results for input(s): LABPROT, INR in the last 72 hours. ABG No results for input(s): PHART, HCO3 in the last 72 hours.  Invalid input(s): PCO2, PO2  Studies/Results: Ct Abdomen Pelvis W Contrast  Result Date: 04/10/2016 CLINICAL DATA:  Lower abdominal pain and distention.  Constipation. EXAM: CT ABDOMEN AND PELVIS WITH CONTRAST TECHNIQUE: Multidetector CT imaging of the abdomen and pelvis was performed using the standard protocol following bolus administration of intravenous contrast. CONTRAST:  155mL ISOVUE-300 IOPAMIDOL (ISOVUE-300) INJECTION 61% COMPARISON:  CT scan of December 03, 2007. FINDINGS: Lower chest: Minimal bibasilar subsegmental atelectasis is noted. Hepatobiliary: No gallstones are noted.  Normal liver. Pancreas: Fatty replacement of the pancreas is noted. Spleen: Stable hemangioma seen in the spleen. Adrenals/Urinary Tract: Adrenal glands and kidneys appear normal. No hydronephrosis or renal obstruction is noted. Mild urinary bladder distention is noted. Stomach/Bowel: The  appendix appears normal. There is noted volvulus involving the distal portion of sigmoid colon resulting in dilatation of the more proximal colon and a large amount of stool present. No small bowel dilatation is noted. Vascular/Lymphatic: Atherosclerosis of abdominal aorta is noted without aneurysm formation. No adenopathy is noted. Reproductive: Mildly enlarged prostate gland is noted with associated calcifications. Other: No abnormal fluid collection is noted. Musculoskeletal: Severe multilevel degenerative disc disease is noted in the thoracic and lumbar spine. IMPRESSION: Aortic atherosclerosis. Volvulus is seen involving the distal portion of the sigmoid colon resulting in significant obstruction. Critical Value/emergent results were called by telephone at the time of interpretation on 04/10/2016 at 6:18 pm to [place] , who verbally acknowledged these results. Electronically Signed   By: Marijo Conception, M.D.   On: 04/10/2016 18:18    Anti-infectives: Anti-infectives    None      Assessment/Plan: Sigmoid volvulus - S/P endoscopic reduction. He is having BMs. He does not want surgery at this time. He is aware this may recur.  LOS: 2 days    Jacquese Hackman E 04/12/2016

## 2016-04-12 NOTE — Progress Notes (Signed)
Pt transferred to [facility] home via transport. Pt remains alert and oriented x 4. No signs and symptoms of distress. Dorthey Sawyer, RN

## 2016-04-12 NOTE — Discharge Summary (Signed)
PRANJAL MAYHEW [id number] DOB: 10-Apr-1925 DOA: 04/10/2016  PCP: Gwendolyn Grant, MD  Admit date: 04/10/2016 Discharge date: 04/12/2016  Time spent: 35 minutes  Recommendations for Outpatient Follow-up:  1. Repeat BMET to follow electrolytes and renal function  2. Follow resolution of symptoms and if needed refer to general surgery office for elective procedure.   Discharge Diagnoses:  Principal Problem:   Sigmoid volvulus ([place]) Active Problems:   A-fib ([place])   Chronic systolic CHF (congestive heart failure) (HCC)   Chronic a-fib (HCC)   Volvulus (HCC)   Pressure injury of skin   Chronic diastolic HF (heart failure) (HCC)   Hypokalemia   Discharge Condition: stable and improved. Discharge to SNF for further care and rehabilitation. Follow up with PCP in 10 days.  Diet recommendation: full liquid diet for 2 days; then low residue/heart healthy diet.       Filed Weights   04/10/16 1532 04/10/16 2000 04/10/16 2358  Weight: 65.8 kg (145 lb) 65.8 kg (145 lb) 65.5 kg (144 lb 6.4 oz)    History of present illness:  As per Dr. Alcario Drought H&P written  on 04/10/16 82 y.o.malewith medical history significant of A.fib on eliquis, chronic CHF recorded as systolic but has normal EF on last echo in 2015, severe torticollis. Patient presents to the ED from SNF with c/o abdominal pain. 3-4 days of worsening, LLQ abd pain and distention, last BM was 5 days ago. Nothing makes better or worse, not passing flatus. No h/o GIB. Pain is severe and worsening.  ED Course:Found to have sigmoid volvulus, this has been decompressed endoscopically by Dr. Hilarie Fredrickson. Patient being admitted to medicine for pre-op cardiac eval for possible sigmoid resection.  Hospital Course:  1-Sigmoid volvulus: patient presented with abd pain, nausea and vomiting. -symptoms improved after sigmoidoscopy yesterday -patient now stable and tolerating full liquid diet -he understand high likelihood  of recurrence condition; but will like to avoid invasive procedure at this time -ok to discharge back to SNF as per [facility] rec's  -will resume anticoagulation at discharge   2-hypokalemia: -replted -will recommend BMET in 10 days to follow up electrolytes and renal function   3-atrial fibrillation -will monitor on telemetry -CHADsVASC score 5 -on eliquis at home; resumed and dose adjusted as per cardiology rec's -continue cardizem  4-hx of severe torticollis  -chronic and unchanged  -PT recommending SNF for further care and rehab  5-pre-operative clearance -echo repeated and no wall motion abnormalities  -EF preserved  -reasonable cardiac risk for surgery if needed   6-chronic diastolic HF -compensated currently -will recommend close follow up of daily weight and low sodium diet  -resume home medication regimen   7-BPH: -will continue cardura and proscar   Procedures:  Sigmoidoscopy 10/17 (for volvulus decompression)   Echo: - Left ventricle: The cavity size was normal. Wall thickness was increased in a pattern of mild LVH. Systolic function was normal. The estimated ejection fraction was in the range of 50% to 55%. Doppler parameters are consistent with both elevated ventricular end-diastolic filling pressure and elevated left atrial filling pressure. - Mitral valve: There was mild regurgitation. - Left atrium: The atrium was mildly dilated. - Right atrium: The atrium was mildly dilated. - Tricuspid valve: There was mild-moderate regurgitation. - Pulmonary arteries: PA peak pressure: 40 mm Hg (S).  Consultations:  GI  CCS  Cardiology   Discharge Exam:     Vitals:   04/12/16 0427 04/12/16 0926  BP: 128/78 (!) 132/95  Pulse: 81 87  Resp: 20 18  Temp: 99.1 F (37.3 C) 97.8 F (36.6 C)    General:Afebrile, no CP, denies ab pain. Patient with 4 BM's now and passing gas. No nausea, no vomiting and tolerating  diet.  Cardiovascular:rate controlled, soft SEM, no rubs or gallops  Respiratory:good air movement, no wheezing, no crackles  Abdomen:soft, NT, positive BS; mild distension appreciated   Musculoskeletal: no edema, no cyanosis    Discharge Instructions       Discharge Instructions    (HEART FAILURE PATIENTS) Call MD:  Anytime you have any of the following symptoms: 1) 3 pound weight gain in 24 hours or 5 pounds in 1 week 2) shortness of breath, with or without a dry hacking cough 3) swelling in the hands, feet or stomach 4) if you have to sleep on extra pillows at night in order to breathe.    Complete by:  As directed   Discharge instructions    Complete by:  As directed   Full liquid diet for 2 days Maintain adequate hydration Advance diet to low residue diet on 04-14-16 Follow up with PCP in 10 days Take medications as prescribed        Current Discharge Medication List       CONTINUE these medications which have CHANGED   Details  apixaban (ELIQUIS) 5 MG TABS tablet Take 1 tablet (5 mg total) by mouth 2 (two) times daily.    polyethylene glycol (MIRALAX / GLYCOLAX) packet Take 17 g by mouth daily.         CONTINUE these medications which have NOT CHANGED   Details  cholecalciferol (VITAMIN D) 1000 UNITS tablet Take 2,000 Units by mouth daily.      diltiazem (CARDIZEM CD) 180 MG 24 hr capsule Take 1 capsule (180 mg total) by mouth daily. Qty: 30 capsule, Refills: 11    doxazosin (CARDURA) 2 MG tablet Take 2 mg by mouth at bedtime.      finasteride (PROSCAR) 5 MG tablet Take 5 mg by mouth daily.    furosemide (LASIX) 20 MG tablet Take 20 mg by mouth daily.    iron polysaccharides (FERREX 150) 150 MG capsule Take 1 capsule (150 mg total) by mouth daily. Qty: 30 capsule, Refills: 5    magnesium oxide (MAG-OX) 400 MG tablet Take 400 mg by mouth daily.    Multiple Vitamins-Minerals (PRESERVISION AREDS 2 PO) Take 1 capsule by mouth 2 (two)  times daily.    pantoprazole (PROTONIX) 40 MG tablet Take 1 tablet (40 mg total) by mouth daily.    potassium chloride (KLOR-CON) 10 MEQ CR tablet Take 10 mEq by mouth daily.        No Known Allergies    Follow-up Information    Gwendolyn Grant, MD. Schedule an appointment as soon as possible for a visit in 10 day(s).   Specialty:  Internal Medicine Contact information: [address] [address] [phone]             The results of significant diagnostics from this hospitalization (including imaging, microbiology, ancillary and laboratory) are listed below for reference.    Significant Diagnostic Studies:  Imaging Results  Ct Abdomen Pelvis W Contrast  Result Date: 04/10/2016 CLINICAL DATA:  Lower abdominal pain and distention.  Constipation. EXAM: CT ABDOMEN AND PELVIS WITH CONTRAST TECHNIQUE: Multidetector CT imaging of the abdomen and pelvis was performed using the standard protocol following bolus administration of intravenous contrast. CONTRAST:  132mL ISOVUE-300 IOPAMIDOL (ISOVUE-300) INJECTION 61% COMPARISON:  CT scan of December 03, 2007. FINDINGS: Lower chest: Minimal bibasilar subsegmental atelectasis is noted. Hepatobiliary: No gallstones are noted.  Normal liver. Pancreas: Fatty replacement of the pancreas is noted. Spleen: Stable hemangioma seen in the spleen. Adrenals/Urinary Tract: Adrenal glands and kidneys appear normal. No hydronephrosis or renal obstruction is noted. Mild urinary bladder distention is noted. Stomach/Bowel: The appendix appears normal. There is noted volvulus involving the distal portion of sigmoid colon resulting in dilatation of the more proximal colon and a large amount of stool present. No small bowel dilatation is noted. Vascular/Lymphatic: Atherosclerosis of abdominal aorta is noted without aneurysm formation. No adenopathy is noted. Reproductive: Mildly enlarged prostate gland is noted with associated  calcifications. Other: No abnormal fluid collection is noted. Musculoskeletal: Severe multilevel degenerative disc disease is noted in the thoracic and lumbar spine. IMPRESSION: Aortic atherosclerosis. Volvulus is seen involving the distal portion of the sigmoid colon resulting in significant obstruction. Critical Value/emergent results were called by telephone at the time of interpretation on 04/10/2016 at 6:18 pm to [place] , who verbally acknowledged these results. Electronically Signed   By: Marijo Conception, M.D.   On: 04/10/2016 18:18     Microbiology:        Recent Results (from the past 240 hour(s))  MRSA PCR Screening     Status: Abnormal   Collection Time: 04/11/16  1:24 AM  Result Value Ref Range Status   MRSA by PCR POSITIVE (A) NEGATIVE Final    Comment:        The GeneXpert MRSA Assay (FDA approved for NASAL specimens only), is one component of a comprehensive MRSA colonization surveillance program. It is not intended to diagnose MRSA infection nor to guide or monitor treatment for MRSA infections. RESULT CALLED TO, READ BACK BY AND VERIFIED WITH: C BOKIAGON,RN @0349  04/11/16 MKELLY,MLT     Labs: Basic Metabolic Panel:  Last Labs    Recent Labs Lab 04/10/16 1625 04/11/16 0652 04/12/16 0538  NA 142 141 142  K 3.4* 2.9* 4.2  CL 106 107 111  CO2 29 26 26   GLUCOSE 132* 116* 87  BUN 17 14 10   CREATININE 0.99 0.99 0.78  CALCIUM 8.7* 8.3* 8.4*  MG  --   --  2.2     Liver Function Tests:  Last Labs    Recent Labs Lab 04/10/16 1625  AST 18  ALT 12*  ALKPHOS 77  BILITOT 0.8  PROT 6.8  ALBUMIN 3.6      Last Labs    Recent Labs Lab 04/10/16 1625  LIPASE 26     CBC:  Last Labs    Recent Labs Lab 04/10/16 1625 04/11/16 0652  WBC 5.0 4.8  NEUTROABS 4.1  --   HGB 12.6* 11.3*  HCT 38.7* 35.6*  MCV 89.8 90.4  PLT 162 150     CBG:  Last Labs    Recent Labs Lab 04/10/16 2101  GLUCAP 108*      Signed:  Barton Dubois MD.  [facility] 04/12/2016, 2:45 PM

## 2016-07-15 ENCOUNTER — Encounter: Payer: Self-pay | Admitting: Student

## 2016-08-13 ENCOUNTER — Emergency Department (HOSPITAL_COMMUNITY): Payer: Medicare Other

## 2016-08-13 ENCOUNTER — Encounter (HOSPITAL_COMMUNITY)
Admission: EM | Disposition: A | Discharge status: Skilled Nursing Facility | Payer: Self-pay | Source: Home / Self Care | Attending: Family Medicine

## 2016-08-13 ENCOUNTER — Encounter (HOSPITAL_COMMUNITY): Payer: Self-pay

## 2016-08-13 ENCOUNTER — Inpatient Hospital Stay (HOSPITAL_COMMUNITY)
Admission: EM | Admit: 2016-08-13 | Discharge: 2016-08-16 | DRG: 389 | Disposition: A | Discharge status: Skilled Nursing Facility | Payer: Medicare Other | Attending: Family Medicine | Admitting: Family Medicine

## 2016-08-13 DIAGNOSIS — I482 Chronic atrial fibrillation: Secondary | ICD-10-CM | POA: Diagnosis present

## 2016-08-13 DIAGNOSIS — H9190 Unspecified hearing loss, unspecified ear: Secondary | ICD-10-CM | POA: Diagnosis present

## 2016-08-13 DIAGNOSIS — I739 Peripheral vascular disease, unspecified: Secondary | ICD-10-CM | POA: Diagnosis present

## 2016-08-13 DIAGNOSIS — R32 Unspecified urinary incontinence: Secondary | ICD-10-CM | POA: Diagnosis present

## 2016-08-13 DIAGNOSIS — N182 Chronic kidney disease, stage 2 (mild): Secondary | ICD-10-CM | POA: Diagnosis present

## 2016-08-13 DIAGNOSIS — Z22322 Carrier or suspected carrier of Methicillin resistant Staphylococcus aureus: Secondary | ICD-10-CM

## 2016-08-13 DIAGNOSIS — N400 Enlarged prostate without lower urinary tract symptoms: Secondary | ICD-10-CM | POA: Diagnosis present

## 2016-08-13 DIAGNOSIS — Z96653 Presence of artificial knee joint, bilateral: Secondary | ICD-10-CM | POA: Diagnosis present

## 2016-08-13 DIAGNOSIS — K219 Gastro-esophageal reflux disease without esophagitis: Secondary | ICD-10-CM | POA: Diagnosis present

## 2016-08-13 DIAGNOSIS — R1084 Generalized abdominal pain: Secondary | ICD-10-CM

## 2016-08-13 DIAGNOSIS — D649 Anemia, unspecified: Secondary | ICD-10-CM | POA: Diagnosis present

## 2016-08-13 DIAGNOSIS — K5730 Diverticulosis of large intestine without perforation or abscess without bleeding: Secondary | ICD-10-CM | POA: Diagnosis present

## 2016-08-13 DIAGNOSIS — R140 Abdominal distension (gaseous): Secondary | ICD-10-CM

## 2016-08-13 DIAGNOSIS — K562 Volvulus: Principal | ICD-10-CM | POA: Diagnosis present

## 2016-08-13 DIAGNOSIS — Z7901 Long term (current) use of anticoagulants: Secondary | ICD-10-CM | POA: Diagnosis not present

## 2016-08-13 DIAGNOSIS — M436 Torticollis: Secondary | ICD-10-CM | POA: Diagnosis present

## 2016-08-13 DIAGNOSIS — I5042 Chronic combined systolic (congestive) and diastolic (congestive) heart failure: Secondary | ICD-10-CM | POA: Diagnosis present

## 2016-08-13 DIAGNOSIS — I4891 Unspecified atrial fibrillation: Secondary | ICD-10-CM | POA: Diagnosis present

## 2016-08-13 DIAGNOSIS — Z8673 Personal history of transient ischemic attack (TIA), and cerebral infarction without residual deficits: Secondary | ICD-10-CM

## 2016-08-13 DIAGNOSIS — Z981 Arthrodesis status: Secondary | ICD-10-CM | POA: Diagnosis not present

## 2016-08-13 DIAGNOSIS — I872 Venous insufficiency (chronic) (peripheral): Secondary | ICD-10-CM | POA: Diagnosis present

## 2016-08-13 DIAGNOSIS — Z66 Do not resuscitate: Secondary | ICD-10-CM | POA: Diagnosis present

## 2016-08-13 DIAGNOSIS — Z8249 Family history of ischemic heart disease and other diseases of the circulatory system: Secondary | ICD-10-CM

## 2016-08-13 DIAGNOSIS — Z8546 Personal history of malignant neoplasm of prostate: Secondary | ICD-10-CM

## 2016-08-13 DIAGNOSIS — Z79899 Other long term (current) drug therapy: Secondary | ICD-10-CM

## 2016-08-13 DIAGNOSIS — N189 Chronic kidney disease, unspecified: Secondary | ICD-10-CM | POA: Diagnosis present

## 2016-08-13 DIAGNOSIS — N183 Chronic kidney disease, stage 3 (moderate): Secondary | ICD-10-CM | POA: Diagnosis not present

## 2016-08-13 HISTORY — PX: FLEXIBLE SIGMOIDOSCOPY: SHX5431

## 2016-08-13 HISTORY — DX: Volvulus: K56.2

## 2016-08-13 LAB — CBC WITH DIFFERENTIAL/PLATELET
Basophils Absolute: 0 10*3/uL (ref 0.0–0.1)
Basophils Relative: 0 %
Eosinophils Absolute: 0 10*3/uL (ref 0.0–0.7)
Eosinophils Relative: 0 %
HEMATOCRIT: 36.6 % — AB (ref 39.0–52.0)
Hemoglobin: 11.8 g/dL — ABNORMAL LOW (ref 13.0–17.0)
LYMPHS PCT: 8 %
Lymphs Abs: 0.7 10*3/uL (ref 0.7–4.0)
MCH: 28.1 pg (ref 26.0–34.0)
MCHC: 32.2 g/dL (ref 30.0–36.0)
MCV: 87.1 fL (ref 78.0–100.0)
MONO ABS: 0.4 10*3/uL (ref 0.1–1.0)
MONOS PCT: 4 %
Neutro Abs: 8.6 10*3/uL — ABNORMAL HIGH (ref 1.7–7.7)
Neutrophils Relative %: 88 %
Platelets: 247 10*3/uL (ref 150–400)
RBC: 4.2 MIL/uL — ABNORMAL LOW (ref 4.22–5.81)
RDW: 15.7 % — AB (ref 11.5–15.5)
WBC: 9.7 10*3/uL (ref 4.0–10.5)

## 2016-08-13 LAB — URINALYSIS, ROUTINE W REFLEX MICROSCOPIC
Bilirubin Urine: NEGATIVE
Glucose, UA: NEGATIVE mg/dL
Hgb urine dipstick: NEGATIVE
Ketones, ur: NEGATIVE mg/dL
Nitrite: POSITIVE — AB
Protein, ur: 100 mg/dL — AB
Specific Gravity, Urine: 1.024 (ref 1.005–1.030)
pH: 8 (ref 5.0–8.0)

## 2016-08-13 LAB — LIPASE, BLOOD: LIPASE: 35 U/L (ref 11–51)

## 2016-08-13 LAB — MRSA PCR SCREENING: MRSA by PCR: POSITIVE — AB

## 2016-08-13 LAB — COMPREHENSIVE METABOLIC PANEL
ALT: 24 U/L (ref 17–63)
AST: 29 U/L (ref 15–41)
Albumin: 3.4 g/dL — ABNORMAL LOW (ref 3.5–5.0)
Alkaline Phosphatase: 75 U/L (ref 38–126)
Anion gap: 8 (ref 5–15)
BILIRUBIN TOTAL: 0.8 mg/dL (ref 0.3–1.2)
BUN: 25 mg/dL — AB (ref 6–20)
CALCIUM: 9.2 mg/dL (ref 8.9–10.3)
CO2: 28 mmol/L (ref 22–32)
CREATININE: 1.21 mg/dL (ref 0.61–1.24)
Chloride: 104 mmol/L (ref 101–111)
GFR calc Af Amer: 59 mL/min — ABNORMAL LOW (ref >60)
GFR, EST NON AFRICAN AMERICAN: 51 mL/min — AB (ref >60)
Glucose, Bld: 132 mg/dL — ABNORMAL HIGH (ref 65–99)
Potassium: 4.5 mmol/L (ref 3.5–5.1)
Sodium: 140 mmol/L (ref 135–145)
TOTAL PROTEIN: 6.7 g/dL (ref 6.5–8.1)

## 2016-08-13 LAB — I-STAT CG4 LACTIC ACID, ED: LACTIC ACID, VENOUS: 0.94 mmol/L (ref 0.5–1.9)

## 2016-08-13 SURGERY — SIGMOIDOSCOPY, FLEXIBLE
Anesthesia: Moderate Sedation

## 2016-08-13 MED ORDER — DOXAZOSIN MESYLATE 2 MG PO TABS
2.0000 mg | ORAL_TABLET | Freq: Every day | ORAL | Status: DC
  Administered 2016-08-13 – 2016-08-15 (×3): 2 mg via ORAL
  Filled 2016-08-13 (×3): qty 1

## 2016-08-13 MED ORDER — DILTIAZEM HCL ER COATED BEADS 180 MG PO CP24
180.0000 mg | ORAL_CAPSULE | Freq: Every day | ORAL | Status: DC
  Administered 2016-08-14 – 2016-08-16 (×3): 180 mg via ORAL
  Filled 2016-08-13 (×3): qty 1

## 2016-08-13 MED ORDER — FINASTERIDE 5 MG PO TABS
5.0000 mg | ORAL_TABLET | Freq: Every day | ORAL | Status: DC
  Administered 2016-08-14 – 2016-08-16 (×3): 5 mg via ORAL
  Filled 2016-08-13 (×3): qty 1

## 2016-08-13 MED ORDER — MUPIROCIN 2 % EX OINT
1.0000 "application " | TOPICAL_OINTMENT | Freq: Two times a day (BID) | CUTANEOUS | Status: DC
  Administered 2016-08-14 – 2016-08-16 (×5): 1 via NASAL
  Filled 2016-08-13 (×3): qty 22

## 2016-08-13 MED ORDER — ONDANSETRON HCL 4 MG/2ML IJ SOLN: 4.0000 mg | Freq: Four times a day (QID) | INTRAMUSCULAR | Status: DC | PRN

## 2016-08-13 MED ORDER — IOPAMIDOL (ISOVUE-300) INJECTION 61%
INTRAVENOUS | Status: AC
  Administered 2016-08-13: 80 mL
  Filled 2016-08-13: qty 100

## 2016-08-13 MED ORDER — POLYETHYLENE GLYCOL 3350 17 G PO PACK
17.0000 g | PACK | Freq: Every day | ORAL | Status: DC
Start: 2016-08-14 — End: 2016-08-16
  Administered 2016-08-14 – 2016-08-16 (×2): 17 g via ORAL
  Filled 2016-08-13 (×3): qty 1

## 2016-08-13 MED ORDER — MAGNESIUM OXIDE 400 (241.3 MG) MG PO TABS
400.0000 mg | ORAL_TABLET | Freq: Every day | ORAL | Status: DC
  Administered 2016-08-14 – 2016-08-16 (×3): 400 mg via ORAL
  Filled 2016-08-13 (×3): qty 1

## 2016-08-13 MED ORDER — SODIUM CHLORIDE 0.9 % IJ SOLN
INTRAMUSCULAR | Status: AC
  Filled 2016-08-13: qty 50

## 2016-08-13 MED ORDER — MIDAZOLAM HCL 5 MG/ML IJ SOLN
INTRAMUSCULAR | Status: AC
  Filled 2016-08-13: qty 3

## 2016-08-13 MED ORDER — PSEUDOEPHEDRINE-GUAIFENESIN ER 60-600 MG PO TB12: 1.0000 | ORAL_TABLET | Freq: Two times a day (BID) | ORAL | Status: DC

## 2016-08-13 MED ORDER — LACTATED RINGERS IV SOLN
INTRAVENOUS | Status: AC
  Administered 2016-08-13: via INTRAVENOUS

## 2016-08-13 MED ORDER — ACETAMINOPHEN 325 MG PO TABS: 650.0000 mg | ORAL_TABLET | Freq: Four times a day (QID) | ORAL | Status: DC | PRN

## 2016-08-13 MED ORDER — GUAIFENESIN ER 600 MG PO TB12
600.0000 mg | ORAL_TABLET | Freq: Two times a day (BID) | ORAL | Status: DC
  Administered 2016-08-13 – 2016-08-16 (×5): 600 mg via ORAL
  Filled 2016-08-13 (×7): qty 1

## 2016-08-13 MED ORDER — PANTOPRAZOLE SODIUM 40 MG PO TBEC
40.0000 mg | DELAYED_RELEASE_TABLET | Freq: Every day | ORAL | Status: DC
  Administered 2016-08-14 – 2016-08-16 (×3): 40 mg via ORAL
  Filled 2016-08-13 (×3): qty 1

## 2016-08-13 MED ORDER — AZITHROMYCIN 250 MG PO TABS
250.0000 mg | ORAL_TABLET | Freq: Every day | ORAL | Status: AC
  Administered 2016-08-13 – 2016-08-14 (×2): 250 mg via ORAL
  Filled 2016-08-13 (×2): qty 1

## 2016-08-13 MED ORDER — ONDANSETRON HCL 4 MG PO TABS: 4.0000 mg | ORAL_TABLET | Freq: Four times a day (QID) | ORAL | Status: DC | PRN

## 2016-08-13 MED ORDER — ACETAMINOPHEN 650 MG RE SUPP: 650.0000 mg | Freq: Four times a day (QID) | RECTAL | Status: DC | PRN

## 2016-08-13 MED ORDER — PSEUDOEPHEDRINE HCL 60 MG PO TABS
60.0000 mg | ORAL_TABLET | Freq: Two times a day (BID) | ORAL | Status: DC
  Filled 2016-08-13 (×6): qty 1

## 2016-08-13 MED ORDER — CHLORHEXIDINE GLUCONATE CLOTH 2 % EX PADS
6.0000 | MEDICATED_PAD | Freq: Every day | CUTANEOUS | Status: DC
  Administered 2016-08-14 – 2016-08-16 (×3): 6 via TOPICAL

## 2016-08-13 NOTE — H&P (View-Only) (Signed)
[facility] Consult Note   History Eduardo Deleon MRN # [id number]  Date of Admission: 08/13/2016 Date of Consultation: 08/13/2016 Referring physician: Dr. Leo Grosser, MD  Reason for Consultation/Chief Complaint: Abdominal pain and distention  Subjective  HPI:  This is an 83 year old man with a history of sigmoid volvulus requiring endoscopic decompression in October 2017 who presents to the ED from a nursing facility for abdominal pain and distention. He is a poor historian and is very hard of hearing. It does not seem that he had vomiting, he just developed subacute abdominal pain over the last 24 hours. He does not recall when he last had a bowel movement. He denies chest pain and says he is always somewhat short of breath with coughing and sputum production. He on that, it is difficult to get a helpful history or review of systems.  ROS: Remainder of systems appear to be negative as near as can be determined   Past Medical History Past Medical History:  Diagnosis Date  . Anal fissure    Hx of  . Anemia   . Atrial fibrillation (HCC)    chronic anticaog - weintraub  . Congestive heart failure ([place])   . CRI (chronic renal insufficiency)   . Diverticulosis 04/12/1998   Left colon--Flex Sig  . GERD (gastroesophageal reflux disease)   . Internal hemorrhoids   . Osteoarthritis   . Prostate cancer ([place])   . PVD (peripheral vascular disease) ([place])   . Splenic lesion   . Stroke [facility]) 1978 lower brain stem  . Torticollis, acquired   . Tubular adenoma   . Venous stasis dermatitis    hx venous ulcer/cellulitis 06/2011 R and 04/2011 L    Past Surgical History Past Surgical History:  Procedure Laterality Date  . COLONOSCOPY     1988  . DIRECT LARYNGOSCOPY N/A 03/31/2014   Procedure: DIRECT LARYNGOSCOPY/EXAM UNDER ANESTHESIA;  Surgeon: [name], MD;  Location: [place];  Service: ENT;  Laterality: N/A;  . FLEXIBLE SIGMOIDOSCOPY N/A 04/10/2016   Procedure:  FLEXIBLE SIGMOIDOSCOPY;  Surgeon: Jerene Bears, MD;  Location: [facility] ENDOSCOPY;  Service: Endoscopy;  Laterality: N/A;  . HEMORRHOID SURGERY    . HERNIA REPAIR     left-1982, right - 1985  . JOINT REPLACEMENT  bilaterial knees  . LUMBAR LAMINECTOMY  Per patient  heriated disc in mid spine  . TONSILLECTOMY      Family History Family History  Problem Relation Age of Onset  . Heart disease Father   . Breast cancer Mother   . Diabetes Brother   . Diabetes      grandmother  . Colon cancer Neg Hx     Social History Social History   Social History  . Marital status: Married    Spouse name: N/A  . Number of children: N/A  . Years of education: N/A   Social History Main Topics  . Smoking status: Never Smoker  . Smokeless tobacco: Never Used     Comment: married, wife in [place] since 2013  . Alcohol use No  . Drug use: No  . Sexual activity: Not Asked   Other Topics Concern  . None   Social History Narrative  . None    Allergies No Known Allergies  Outpatient Meds Home medications from the H+P and/or nursing med reconciliation reviewed.  Inpatient med list reviewed  _____________________________________________________________________ Objective   Exam:  Current vital signs  Patient Vitals for the past 8 hrs:  BP Temp Temp src Pulse  Resp SpO2 Height Weight  08/13/16 1616 96/69 - - 68 18 96 % - -  08/13/16 1517 - - - - - - 5\' 6"  (1.676 m) 145 lb (65.8 kg)  08/13/16 1514 95/69 98.2 F (36.8 C) Oral 73 16 96 % - -   No intake or output data in the 24 hours ending 08/13/16 1837  Physical Exam:    General: this is a Deconditioned, feeble elderly male patient in no acute distress. He has severe torticollis of the neck  Eyes: sclera anicteric, no redness. He has lid drop on the left  ENT: oral mucosa moist without lesions, no cervical or supraclavicular lymphadenopathy, poor dentition  CV: RRR without murmur, S1/S2, no JVD,, no peripheral edema. Bilateral venous  stasis changes  Resp: Fair inspiratory effort, rhonchi heard bilaterally, normal RR and effort noted  GI: soft, distended and tympanitic, no tenderness, with active bowel sounds. No guarding or palpable organomegaly noted  Skin; warm and dry, no rash or jaundice noted  Neuro: He is unable to participate in the neuro exam since he is very hard of hearing and has limited range of motion of all extremities. He is alert and conversational, and recalls the events of his October hospital stay when he last had a volvulus.  Labs:   Recent Labs Lab 08/13/16 1520  WBC 9.7  HGB 11.8*  HCT 36.6*  PLT 247    Recent Labs Lab 08/13/16 1520  NA 140  K 4.5  CL 104  CO2 28  BUN 25*  ALBUMIN 3.4*  ALKPHOS 75  ALT 24  AST 29  GLUCOSE 132*   No results for input(s): INR in the last 168 hours.  Radiologic studies: I personally reviewed the images of the CT abdomen and pelvis that shows a classic sigmoid volvulus  @ASSESSMENTPLANBEGIN @ Impression:  Sigmoid volvulus Generalized abdominal pain and distention  He has no signs of sepsis or perforation now. Ever, he requires an urgent bedside endoscopic decompression. It seems that he was either not offered surgery or he declined surgery as definitive therapy for this back in October. That certainly needs to be reconsidered now after his second episode in 4 months. Plan:  Bedside sigmoidoscopy Patient is agreeable after a thorough discussion of the procedure and risks.  The benefits and risks of the planned procedure were described in detail with the patient or (when appropriate) their health care proxy.  Risks were outlined as including, but not limited to, bleeding, infection, perforation, adverse medication reaction leading to cardiac or pulmonary decompensation, or pancreatitis (if ERCP).  The limitation of incomplete mucosal visualization was also discussed.  No guarantees or warranties were given.   Patient at increased risk for  cardiopulmonary complications of procedure due to medical comorbidities.    Thank you for the courtesy of this consult.  Please contact me with any questions or concerns.  Nelida Meuse III Pager: [phone] Mon-Fri 8a-5p [phone] after 5p, weekends, holidays

## 2016-08-13 NOTE — Interval H&P Note (Signed)
History and Physical Interval Note:  08/13/2016 6:53 PM  Eduardo Deleon  has presented today for surgery, with the diagnosis of Sigmoid Volvulus  The various methods of treatment have been discussed with the patient and family. After consideration of risks, benefits and other options for treatment, the patient has consented to  Procedure(s): FLEXIBLE SIGMOIDOSCOPY (N/A) as a surgical intervention .  The patient's history has been reviewed, patient examined, no change in status, stable for surgery.  I have reviewed the patient's chart and labs.  Questions were answered to the patient's satisfaction.     Nelida Meuse III

## 2016-08-13 NOTE — Op Note (Signed)
[facility] Patient Name: Eduardo Deleon Procedure Date: 08/13/2016 MRN: [id number] Attending MD: Estill Cotta. Loletha Carrow , MD Date of Birth: September 14, 1924 CSN: [id number] Age: 83 Admit Type: Emergency Department Procedure:                Flexible Sigmoidoscopy Indications:              For therapy of volvulus Providers:                Mallie Mussel L. Loletha Carrow, MD, Laverta Baltimore RN, RN, Cherylynn Ridges, Technician Referring MD:              Medicines:                None Complications:            No immediate complications. Estimated Blood Loss:     Estimated blood loss: none. Procedure:                Pre-Anesthesia Assessment:                           - Prior to the procedure, a History and Physical                            was performed, and patient medications and                            allergies were reviewed. The patient's tolerance of                            previous anesthesia was also reviewed. The risks                            and benefits of the procedure and the sedation                            options and risks were discussed with the patient.                            All questions were answered, and informed consent                            was obtained. Prior Anticoagulants: The patient has                            taken Eliquis (apixaban), last dose was day of                            procedure. ASA Grade Assessment: III - A patient                            with severe systemic disease. After reviewing the  risks and benefits, the patient was deemed in                            satisfactory condition to undergo the procedure.                           After obtaining informed consent, the scope was                            passed under direct vision. The [id number]                            scope was introduced through the anus and advanced                            to the the  descending colon. The flexible                            sigmoidoscopy was accomplished without difficulty.                            The patient tolerated the procedure well. The                            quality of the bowel preparation was poor. Scope In: Scope Out: Findings:      The perianal and digital rectal examinations were normal.      A volvulus, with viable appearing mucosa, was found in the distal       sigmoid colon. Decompression of the volvulus was attempted and was       successful, with complete decompression achieved.      It should be noted that a partial torsion remained even after       decompression. That, combined with the patient's tolerance of the       volvulus and normal WBC with lack of ischemia and sepsis, indecate that       this was an acute on chronic process.      Images could not be obtained due to technical problems. Impression:               - Preparation of the colon was poor.                           - Volvulus. Successful complete decompression                            achieved.                           - No specimens collected.                           see above. This patient needs definitive surgical                            management. Moderate Sedation:      none Recommendation:           -  Admit the patient to hospital ward for                            observation. Procedure Code(s):        --- Professional ---                           [phone], Sigmoidoscopy, flexible; with decompression                            (for pathologic distention) (eg, volvulus,                            megacolon), including placement of decompression                            tube, when performed Diagnosis Code(s):        --- Professional ---                           K56.2, Volvulus CPT copyright 2016 American Medical Association. All rights reserved. The codes documented in this report are preliminary and upon coder review may  be revised to  meet current compliance requirements. Henry L. Loletha Carrow, MD 08/13/2016 7:38:45 PM This report has been signed electronically. Number of Addenda: 0

## 2016-08-13 NOTE — Consult Note (Signed)
[facility] Consult Note   History Eduardo Deleon MRN # [id number]  Date of Admission: 08/13/2016 Date of Consultation: 08/13/2016 Referring physician: Dr. Leo Grosser, MD  Reason for Consultation/Chief Complaint: Abdominal pain and distention  Subjective  HPI:  This is an 83 year old man with a history of sigmoid volvulus requiring endoscopic decompression in October 2017 who presents to the ED from a nursing facility for abdominal pain and distention. He is a poor historian and is very hard of hearing. It does not seem that he had vomiting, he just developed subacute abdominal pain over the last 24 hours. He does not recall when he last had a bowel movement. He denies chest pain and says he is always somewhat short of breath with coughing and sputum production. He on that, it is difficult to get a helpful history or review of systems.  ROS: Remainder of systems appear to be negative as near as can be determined   Past Medical History Past Medical History:  Diagnosis Date  . Anal fissure    Hx of  . Anemia   . Atrial fibrillation (HCC)    chronic anticaog - weintraub  . Congestive heart failure ([employer])   . CRI (chronic renal insufficiency)   . Diverticulosis 04/12/1998   Left colon--Flex Sig  . GERD (gastroesophageal reflux disease)   . Internal hemorrhoids   . Osteoarthritis   . Prostate cancer ([place])   . PVD (peripheral vascular disease) ([place])   . Splenic lesion   . Stroke [facility]) 1978 lower brain stem  . Torticollis, acquired   . Tubular adenoma   . Venous stasis dermatitis    hx venous ulcer/cellulitis 06/2011 R and 04/2011 L    Past Surgical History Past Surgical History:  Procedure Laterality Date  . COLONOSCOPY     1988  . DIRECT LARYNGOSCOPY N/A 03/31/2014   Procedure: DIRECT LARYNGOSCOPY/EXAM UNDER ANESTHESIA;  Surgeon: [name], MD;  Location: [place];  Service: ENT;  Laterality: N/A;  . FLEXIBLE SIGMOIDOSCOPY N/A 04/10/2016   Procedure:  FLEXIBLE SIGMOIDOSCOPY;  Surgeon: Jerene Bears, MD;  Location: [facility] ENDOSCOPY;  Service: Endoscopy;  Laterality: N/A;  . HEMORRHOID SURGERY    . HERNIA REPAIR     left-1982, right - 1985  . JOINT REPLACEMENT  bilaterial knees  . LUMBAR LAMINECTOMY  Per patient  heriated disc in mid spine  . TONSILLECTOMY      Family History Family History  Problem Relation Age of Onset  . Heart disease Father   . Breast cancer Mother   . Diabetes Brother   . Diabetes      grandmother  . Colon cancer Neg Hx     Social History Social History   Social History  . Marital status: Married    Spouse name: N/A  . Number of children: N/A  . Years of education: N/A   Social History Main Topics  . Smoking status: Never Smoker  . Smokeless tobacco: Never Used     Comment: married, wife in [place] since 2013  . Alcohol use No  . Drug use: No  . Sexual activity: Not Asked   Other Topics Concern  . None   Social History Narrative  . None    Allergies No Known Allergies  Outpatient Meds Home medications from the H+P and/or nursing med reconciliation reviewed.  Inpatient med list reviewed  _____________________________________________________________________ Objective   Exam:  Current vital signs  Patient Vitals for the past 8 hrs:  BP Temp Temp src Pulse  Resp SpO2 Height Weight  08/13/16 1616 96/69 - - 68 18 96 % - -  08/13/16 1517 - - - - - - 5\' 6"  (1.676 m) 145 lb (65.8 kg)  08/13/16 1514 95/69 98.2 F (36.8 C) Oral 73 16 96 % - -   No intake or output data in the 24 hours ending 08/13/16 1837  Physical Exam:    General: this is a Deconditioned, feeble elderly male patient in no acute distress. He has severe torticollis of the neck  Eyes: sclera anicteric, no redness. He has lid drop on the left  ENT: oral mucosa moist without lesions, no cervical or supraclavicular lymphadenopathy, poor dentition  CV: RRR without murmur, S1/S2, no JVD,, no peripheral edema. Bilateral venous  stasis changes  Resp: Fair inspiratory effort, rhonchi heard bilaterally, normal RR and effort noted  GI: soft, distended and tympanitic, no tenderness, with active bowel sounds. No guarding or palpable organomegaly noted  Skin; warm and dry, no rash or jaundice noted  Neuro: He is unable to participate in the neuro exam since he is very hard of hearing and has limited range of motion of all extremities. He is alert and conversational, and recalls the events of his October hospital stay when he last had a volvulus.  Labs:   Recent Labs Lab 08/13/16 1520  WBC 9.7  HGB 11.8*  HCT 36.6*  PLT 247    Recent Labs Lab 08/13/16 1520  NA 140  K 4.5  CL 104  CO2 28  BUN 25*  ALBUMIN 3.4*  ALKPHOS 75  ALT 24  AST 29  GLUCOSE 132*   No results for input(s): INR in the last 168 hours.  Radiologic studies: I personally reviewed the images of the CT abdomen and pelvis that shows a classic sigmoid volvulus  @ASSESSMENTPLANBEGIN @ Impression:  Sigmoid volvulus Generalized abdominal pain and distention  He has no signs of sepsis or perforation now. Ever, he requires an urgent bedside endoscopic decompression. It seems that he was either not offered surgery or he declined surgery as definitive therapy for this back in October. That certainly needs to be reconsidered now after his second episode in 4 months. Plan:  Bedside sigmoidoscopy Patient is agreeable after a thorough discussion of the procedure and risks.  The benefits and risks of the planned procedure were described in detail with the patient or (when appropriate) their health care proxy.  Risks were outlined as including, but not limited to, bleeding, infection, perforation, adverse medication reaction leading to cardiac or pulmonary decompensation, or pancreatitis (if ERCP).  The limitation of incomplete mucosal visualization was also discussed.  No guarantees or warranties were given.   Patient at increased risk for  cardiopulmonary complications of procedure due to medical comorbidities.    Thank you for the courtesy of this consult.  Please contact me with any questions or concerns.  Nelida Meuse III Pager: [phone] Mon-Fri 8a-5p [phone] after 5p, weekends, holidays

## 2016-08-13 NOTE — ED Provider Notes (Signed)
[facility] DEPT Provider Note   CSN: [id number] Arrival date & time: 08/13/16  1504     History   Chief Complaint Chief Complaint  Patient presents with  . Abdominal Pain    HPI OZIE PHOU is a 83 y.o. male.  The history is provided by the patient.  Abdominal Pain   This is a recurrent problem. The current episode started 6 to 12 hours ago. The problem occurs constantly. The problem has not changed since onset.The pain is associated with an unknown factor. The pain is located in the RLQ, LLQ and suprapubic region. The quality of the pain is aching. The pain is moderate. Associated symptoms include anorexia. Pertinent negatives include fever, diarrhea and vomiting. Nothing aggravates the symptoms. Nothing relieves the symptoms. Past workup includes GI consult. Past medical history comments: volvulus.    Past Medical History:  Diagnosis Date  . Anal fissure    Hx of  . Anemia   . Atrial fibrillation (HCC)    chronic anticaog - weintraub  . Congestive heart failure ([place])   . CRI (chronic renal insufficiency)   . Diverticulosis 04/12/1998   Left colon--Flex Sig  . GERD (gastroesophageal reflux disease)   . Internal hemorrhoids   . Osteoarthritis   . Prostate cancer ([place])   . PVD (peripheral vascular disease) ([place])   . Splenic lesion   . Stroke [facility]) 1978 lower brain stem  . Torticollis, acquired   . Tubular adenoma   . Venous stasis dermatitis    hx venous ulcer/cellulitis 06/2011 R and 04/2011 L    Patient Active Problem List   Diagnosis Date Noted  . Pressure injury of skin 04/11/2016  . Chronic diastolic HF (heart failure) ([place])   . Hypokalemia   . Volvulus ([place]) 04/10/2016  . Sigmoid volvulus ([place])   . Venous stasis dermatitis of both lower extremities 05/26/2015  . Non-compliant behavior 04/27/2014  . Chronic a-fib ([place]) 04/08/2014  . Acute respiratory failure ([place]) 03/29/2014  . Prostate CA ([place]) 10/11/2013  . Elevated brain natriuretic peptide  (BNP) level 10/11/2013  . Altered mental status 10/08/2013  . Atrial fibrillation ([place]) 11/21/2012  . Long term current use of anticoagulant therapy 11/21/2012  . Heme positive stool 06/29/2011  . Lower extremity venous stasis 06/28/2011  . Torticollis 06/28/2011  . CRI (chronic renal insufficiency) 06/28/2011  . HX: long term anticoagulant use 06/28/2011  . Anemia 05/20/2011  . A-fib ([place]) 05/19/2011  . GERD (gastroesophageal reflux disease) 05/19/2011  . BPH (benign prostatic hyperplasia) 05/19/2011  . Chronic systolic CHF (congestive heart failure) ([place]) 05/19/2011    Past Surgical History:  Procedure Laterality Date  . COLONOSCOPY     1988  . DIRECT LARYNGOSCOPY N/A 03/31/2014   Procedure: DIRECT LARYNGOSCOPY/EXAM UNDER ANESTHESIA;  Surgeon: [name], MD;  Location: [facility];  Service: ENT;  Laterality: N/A;  . FLEXIBLE SIGMOIDOSCOPY N/A 04/10/2016   Procedure: FLEXIBLE SIGMOIDOSCOPY;  Surgeon: Jerene Bears, MD;  Location: [name] ENDOSCOPY;  Service: Endoscopy;  Laterality: N/A;  . HEMORRHOID SURGERY    . HERNIA REPAIR     left-1982, right - 1985  . JOINT REPLACEMENT  bilaterial knees  . LUMBAR LAMINECTOMY  Per patient  heriated disc in mid spine  . TONSILLECTOMY         Home Medications    Prior to Admission medications   Medication Sig Start Date End Date Taking? Authorizing Provider  acetaminophen (TYLENOL) 325 MG tablet Take 650 mg by mouth every 6 (six) hours as  needed for moderate pain.   Yes Historical Provider, MD  apixaban (ELIQUIS) 5 MG TABS tablet Take 1 tablet (5 mg total) by mouth 2 (two) times daily. 04/12/16  Yes Barton Dubois, MD  azithromycin (ZITHROMAX) 250 MG tablet Take 250 mg by mouth as directed. ABT Start Date 08/11/16 & End Date 08/14/16.   Yes Historical Provider, MD  cholecalciferol (VITAMIN D) 1000 UNITS tablet Take 1,000 Units by mouth daily.    Yes Historical Provider, MD  diltiazem (CARDIZEM CD) 180 MG 24 hr capsule Take 1 capsule (180 mg total)  by mouth daily. 04/07/13  Yes Lorretta Harp, MD  doxazosin (CARDURA) 2 MG tablet Take 2 mg by mouth at bedtime.     Yes Historical Provider, MD  finasteride (PROSCAR) 5 MG tablet Take 5 mg by mouth daily.   Yes Historical Provider, MD  furosemide (LASIX) 20 MG tablet Take 20 mg by mouth daily.   Yes Historical Provider, MD  iron polysaccharides (FERREX 150) 150 MG capsule Take 1 capsule (150 mg total) by mouth daily. 09/25/13  Yes Troy Sine, MD  magnesium oxide (MAG-OX) 400 MG tablet Take 400 mg by mouth daily.   Yes Historical Provider, MD  Multiple Vitamins-Minerals (PRESERVISION AREDS 2 PO) Take 1 capsule by mouth 2 (two) times daily.   Yes Historical Provider, MD  pantoprazole (PROTONIX) 40 MG tablet Take 1 tablet (40 mg total) by mouth daily. 04/13/14  Yes Charlynne Cousins, MD  polyethylene glycol [facility] Floria Raveling) packet Take 17 g by mouth daily. 04/12/16  Yes Barton Dubois, MD  potassium chloride (KLOR-CON) 10 MEQ CR tablet Take 10 mEq by mouth daily.    Yes Historical Provider, MD  pseudoephedrine-guaifenesin (MUCINEX D) 60-600 MG 12 hr tablet Take 1 tablet by mouth every 12 (twelve) hours. 7 Day Course. Start Date 08/10/16 & End Date 08/17/16.   Yes Historical Provider, MD    Family History Family History  Problem Relation Age of Onset  . Heart disease Father   . Breast cancer Mother   . Diabetes Brother   . Diabetes      grandmother  . Colon cancer Neg Hx     Social History Social History  Substance Use Topics  . Smoking status: Never Smoker  . Smokeless tobacco: Never Used     Comment: married, wife in [place] since 2013  . Alcohol use No     Allergies   Patient has no known allergies.   Review of Systems Review of Systems  Constitutional: Negative for fever.  Gastrointestinal: Positive for abdominal pain and anorexia. Negative for diarrhea and vomiting.  All other systems reviewed and are negative.    Physical Exam Updated Vital Signs BP 96/69 (BP  Location: Right Arm)   Pulse 68   Temp 98.2 F (36.8 C) (Oral)   Resp 18   Ht 5\' 6"  (1.676 m)   Wt 145 lb (65.8 kg)   SpO2 96%   BMI 23.40 kg/m   Physical Exam  Constitutional: He is oriented to person, place, and time. He appears well-developed and well-nourished. No distress.  HENT:  Head: Normocephalic and atraumatic.  Nose: Nose normal.  Eyes: Conjunctivae are normal.  Neck: Neck supple. No tracheal deviation present.  Severe rightward torticollis, chronic  Cardiovascular: Normal rate, regular rhythm and normal heart sounds.   Pulmonary/Chest: Effort normal and breath sounds normal. No respiratory distress.  Abdominal: Soft. He exhibits distension (mild). There is tenderness (bilateral lower quadrants). There is no rebound.  No hernia.  Neurological: He is alert and oriented to person, place, and time.  Skin: Skin is warm and dry.  Psychiatric: He has a normal mood and affect.     ED Treatments / Results  Labs (all labs ordered are listed, but only abnormal results are displayed) Labs Reviewed  CBC WITH DIFFERENTIAL/PLATELET - Abnormal; Notable for the following:       Result Value   RBC 4.20 (*)    Hemoglobin 11.8 (*)    HCT 36.6 (*)    RDW 15.7 (*)    Neutro Abs 8.6 (*)    All other components within normal limits  COMPREHENSIVE METABOLIC PANEL - Abnormal; Notable for the following:    Glucose, Bld 132 (*)    BUN 25 (*)    Albumin 3.4 (*)    GFR calc non Af Amer 51 (*)    GFR calc Af Amer 59 (*)    All other components within normal limits  LIPASE, BLOOD  URINALYSIS, ROUTINE W REFLEX MICROSCOPIC  I-STAT CG4 LACTIC ACID, ED    EKG  EKG Interpretation None       Radiology Dg Chest 2 View  Result Date: 08/13/2016 CLINICAL DATA:  Abdominal pain.  Chest are clear EXAM: CHEST  2 VIEW COMPARISON:  Radiograph 03/31/2014 FINDINGS: Patient's chin overlies the RIGHT upper lobe. Aorta is ectatic. The heart is enlarged. There is bibasilar atelectasis. No focal  consolidation or pleural fluid. No pneumothorax. IMPRESSION: 1. Cardiomegaly and atelectasis. 2. Chest poorly evaluated secondary to patient positioning and low lung volumes. Electronically Signed   By: Suzy Bouchard M.D.   On: 08/13/2016 17:10   Ct Abdomen Pelvis W Contrast  Result Date: 08/13/2016 CLINICAL DATA:  Lower abdominal pain. EXAM: CT ABDOMEN AND PELVIS WITH CONTRAST TECHNIQUE: Multidetector CT imaging of the abdomen and pelvis was performed using the standard protocol following bolus administration of intravenous contrast. CONTRAST:  80 mL Isovue-300 COMPARISON:  04/10/2016 FINDINGS: Lower chest: Subsegmental atelectasis in the lung bases. No pleural effusion. Right atrial enlargement. Hepatobiliary: No focal liver abnormality is seen. No gallstones, gallbladder wall thickening, or biliary dilatation. Pancreas: Diffuse fatty infiltration as previously seen. Spleen: Similar appearance of approximately 7 cm heterogeneously enhancing mass inferiorly in the spleen which is likely benign since it was also present in 2009 and may represent a hemangioma. Adrenals/Urinary Tract: Unremarkable adrenal glands. No evidence of renal calculi or hydronephrosis. Unchanged hypodense lesions in the left kidney which likely represent cysts and measure up to 2.7 cm in the lower pole. Unremarkable bladder. Stomach/Bowel: The stomach is within normal limits. There is marked gaseous distension of redundant mid to distal sigmoid colon which measures up to 11 cm in diameter. There is a swirled appearance of the mesentery at the distal aspect of the dilated sigmoid loop with abrupt transition to decompressed distal sigmoid and rectum. A moderate amount of stool is present more proximally in the colon. There is no small bowel dilatation. Vascular/Lymphatic: Abdominal aortic atherosclerosis without aneurysm. No enlarged lymph nodes. Reproductive: Unremarkable prostate for age. Other: No intraperitoneal free fluid or free  air. No abdominal wall mass or hernia. Musculoskeletal: Thoracolumbar scoliosis with advanced diffuse disc and facet degeneration. IMPRESSION: Recurrent sigmoid volvulus. Critical Value/emergent results were called by telephone at the time of interpretation on 08/13/2016 at 5:09 pm to Dr. Leo Grosser , who verbally acknowledged these results. Electronically Signed   By: Logan Bores M.D.   On: 08/13/2016 17:10    Procedures Procedures (including critical  care time)  Medications Ordered in ED Medications  sodium chloride 0.9 % injection (not administered)  azithromycin (ZITHROMAX) tablet 250 mg (250 mg Oral Given 08/13/16 2221)  polyethylene glycol (MIRALAX / GLYCOLAX) packet 17 g (not administered)  magnesium oxide (MAG-OX) tablet 400 mg (not administered)  pantoprazole (PROTONIX) EC tablet 40 mg (not administered)  diltiazem (CARDIZEM CD) 24 hr capsule 180 mg (not administered)  finasteride (PROSCAR) tablet 5 mg (not administered)  doxazosin (CARDURA) tablet 2 mg (2 mg Oral Given 08/13/16 2221)  acetaminophen (TYLENOL) tablet 650 mg (not administered)    Or  acetaminophen (TYLENOL) suppository 650 mg (not administered)  ondansetron (ZOFRAN) tablet 4 mg (not administered)    Or  ondansetron (ZOFRAN) injection 4 mg (not administered)  pseudoephedrine (SUDAFED) tablet 60 mg ( Oral See Alternative 08/13/16 2221)    Or  guaiFENesin (MUCINEX) 12 hr tablet 600 mg (600 mg Oral Given 08/13/16 2221)  lactated ringers infusion ( Intravenous New Bag/Given 08/13/16 2339)  mupirocin ointment (BACTROBAN) 2 % 1 application (not administered)  Chlorhexidine Gluconate Cloth 2 % PADS 6 each (not administered)  iopamidol (ISOVUE-300) 61 % injection (80 mLs  Contrast Given 08/13/16 1641)     Initial Impression / Assessment and Plan / ED Course  I have reviewed the triage vital signs and the nursing notes.  Pertinent labs & imaging results that were available during my care of the patient were reviewed by me  and considered in my medical decision making (see chart for details).      83 y.o. male presents with Recurrent lower abdominal pain of both lower quadrants that occurred this morning. He states this is similar to pain he has had previously when he was admitted for sigmoid volvulus. He does have some mild tenderness of bilateral lower quadrants. Screening x-ray was ordered and pending definitive CT evaluation if negative but x-ray was not performed prior to return of lab work and clearance for CT so this was done and demonstrated recurrent volvulus. He was seen here previously by [facility] and had this reduced on his last admission. Consult was placed. Planned admission and sigmoidoscopy reduction.  GI able to reduce volvulus in ED, hospitalist to admit.   Final Clinical Impressions(s) / ED Diagnoses   Final diagnoses:  Sigmoid volvulus [facility])    New Prescriptions New Prescriptions   No medications on file     Leo Grosser, MD 08/14/16 0210

## 2016-08-13 NOTE — H&P (Signed)
History and Physical    Eduardo Deleon [id number] DOB: 1925/01/16 DOA: 08/13/2016  PCP: Gwendolyn Grant, MD - not sure if this is still the case since the patient lives at [facility]:  Lucia Gaskins - surgery; Oval Linsey - cardiology; [place] [facility] Patient coming from: [facility]  Chief Complaint: abdominal pain  HPI: Eduardo Deleon is a 83 y.o. male with medical history significant of A. fib on Eliquis, CHF, CRI, history of prostate cancer, severe torticollis, colonic diverticulosis and recurrent sigmoid volvulus presenting with acute onset of abdominal pain this AM.  The pain was diffuse in his abdomen but by the time I saw him he was s/p sigmoidoscopy and decompression and was no longer having pain.  He did not have n/v with this episode.  Additional history was not able to be obtained due to patient's severe hearing loss as well as his limited historian abilities.  ED Course: GI was called and came to the bedside for flex sig sigmoid decompression which was successful  Review of Systems: As per HPI; otherwise 10 point review of systems reviewed and negative.  This was limited due to his hearing loss and poor history.   Past Medical History:  Diagnosis Date  . Anal fissure    Hx of  . Anemia   . Atrial fibrillation (HCC)    chronic anticaog - weintraub  . Congestive heart failure ([place])   . CRI (chronic renal insufficiency)   . Diverticulosis 04/12/1998   Left colon--Flex Sig  . GERD (gastroesophageal reflux disease)   . Internal hemorrhoids   . Osteoarthritis   . Prostate cancer ([place])   . PVD (peripheral vascular disease) ([place])   . Sigmoid volvulus ([place])    recurrent - 10/17, 2/18  . Splenic lesion   . Stroke [facility]) 1978 lower brain stem  . Torticollis, acquired   . Tubular adenoma   . Venous stasis dermatitis    hx venous ulcer/cellulitis 06/2011 R and 04/2011 L    Past Surgical History:  Procedure Laterality Date  . COLONOSCOPY     1988  . DIRECT  LARYNGOSCOPY N/A 03/31/2014   Procedure: DIRECT LARYNGOSCOPY/EXAM UNDER ANESTHESIA;  Surgeon: [name], MD;  Location: [place];  Service: ENT;  Laterality: N/A;  . FLEXIBLE SIGMOIDOSCOPY N/A 04/10/2016   Procedure: FLEXIBLE SIGMOIDOSCOPY;  Surgeon: Jerene Bears, MD;  Location: [facility] ENDOSCOPY;  Service: Endoscopy;  Laterality: N/A;  . HEMORRHOID SURGERY    . HERNIA REPAIR     left-1982, right - 1985  . JOINT REPLACEMENT  bilaterial knees  . LUMBAR LAMINECTOMY  Per patient  heriated disc in mid spine  . TONSILLECTOMY      Social History   Social History  . Marital status: Married    Spouse name: N/A  . Number of children: N/A  . Years of education: N/A   Occupational History  . Not on file.   Social History Main Topics  . Smoking status: Never Smoker  . Smokeless tobacco: Never Used     Comment: married, wife in [place] since 2013  . Alcohol use No  . Drug use: No  . Sexual activity: Not on file   Other Topics Concern  . Not on file   Social History Narrative  . No narrative on file    No Known Allergies  Family History  Problem Relation Age of Onset  . Heart disease Father   . Breast cancer Mother   . Diabetes Brother   . Diabetes  grandmother  . Colon cancer Neg Hx     Prior to Admission medications   Medication Sig Start Date End Date Taking? Authorizing Provider  acetaminophen (TYLENOL) 325 MG tablet Take 650 mg by mouth every 6 (six) hours as needed for moderate pain.   Yes Historical Provider, MD  apixaban (ELIQUIS) 5 MG TABS tablet Take 1 tablet (5 mg total) by mouth 2 (two) times daily. 04/12/16  Yes Barton Dubois, MD  azithromycin (ZITHROMAX) 250 MG tablet Take 250 mg by mouth as directed. ABT Start Date 08/11/16 & End Date 08/14/16.   Yes Historical Provider, MD  cholecalciferol (VITAMIN D) 1000 UNITS tablet Take 1,000 Units by mouth daily.    Yes Historical Provider, MD  diltiazem (CARDIZEM CD) 180 MG 24 hr capsule Take 1 capsule (180 mg total) by mouth  daily. 04/07/13  Yes Lorretta Harp, MD  doxazosin (CARDURA) 2 MG tablet Take 2 mg by mouth at bedtime.     Yes Historical Provider, MD  finasteride (PROSCAR) 5 MG tablet Take 5 mg by mouth daily.   Yes Historical Provider, MD  furosemide (LASIX) 20 MG tablet Take 20 mg by mouth daily.   Yes Historical Provider, MD  iron polysaccharides (FERREX 150) 150 MG capsule Take 1 capsule (150 mg total) by mouth daily. 09/25/13  Yes Troy Sine, MD  magnesium oxide (MAG-OX) 400 MG tablet Take 400 mg by mouth daily.   Yes Historical Provider, MD  Multiple Vitamins-Minerals (PRESERVISION AREDS 2 PO) Take 1 capsule by mouth 2 (two) times daily.   Yes Historical Provider, MD  pantoprazole (PROTONIX) 40 MG tablet Take 1 tablet (40 mg total) by mouth daily. 04/13/14  Yes Charlynne Cousins, MD  polyethylene glycol [facility] Floria Raveling) packet Take 17 g by mouth daily. 04/12/16  Yes Barton Dubois, MD  potassium chloride (KLOR-CON) 10 MEQ CR tablet Take 10 mEq by mouth daily.    Yes Historical Provider, MD  pseudoephedrine-guaifenesin (MUCINEX D) 60-600 MG 12 hr tablet Take 1 tablet by mouth every 12 (twelve) hours. 7 Day Course. Start Date 08/10/16 & End Date 08/17/16.   Yes Historical Provider, MD    Physical Exam: Vitals:   08/13/16 1920 08/13/16 1925 08/13/16 1930 08/13/16 2034  BP: (!) 120/106 116/74 114/73 124/63  Pulse: 84 87 85 72  Resp: 20 24 20 20   Temp:    97.9 F (36.6 C)  TempSrc:    Oral  SpO2: 100% 100% 99% 95%  Weight:      Height:         General:  Appears calm and comfortable and is NAD Eyes:  PERRL, EOMI, normal lids, iris ENT:  grossly normal hearing, lips & tongue, mmm Neck:  no LAD, masses or thyromegaly; severe torticollis, chronic Cardiovascular:  RRR, no m/r/g. No LE edema.  Respiratory:  CTA bilaterally, no w/r/r. Normal respiratory effort. Abdomen:  soft, ntnd, NABS Skin:  no rash or induration seen on limited exam Musculoskeletal:  grossly normal tone BUE/BLE, good ROM,  no bony abnormality Psychiatric:  grossly normal mood and affect, speech fluent and appropriate Neurologic:  Unable to perform in depth but grossly normal  Labs on Admission: I have personally reviewed following labs and imaging studies  CBC:  Recent Labs Lab 08/13/16 1520  WBC 9.7  NEUTROABS 8.6*  HGB 11.8*  HCT 36.6*  MCV 87.1  PLT [id number]   Basic Metabolic Panel:  Recent Labs Lab 08/13/16 1520  NA 140  K 4.5  CL 104  CO2 28  GLUCOSE 132*  BUN 25*  CREATININE 1.21  CALCIUM 9.2   GFR: Estimated Creatinine Clearance: 37.3 mL/min (by C-G formula based on SCr of 1.21 mg/dL). Liver Function Tests:  Recent Labs Lab 08/13/16 1520  AST 29  ALT 24  ALKPHOS 75  BILITOT 0.8  PROT 6.7  ALBUMIN 3.4*    Recent Labs Lab 08/13/16 1520  LIPASE 35   No results for input(s): AMMONIA in the last 168 hours. Coagulation Profile: No results for input(s): INR, PROTIME in the last 168 hours. Cardiac Enzymes: No results for input(s): CKTOTAL, CKMB, CKMBINDEX, TROPONINI in the last 168 hours. BNP (last 3 results) No results for input(s): PROBNP in the last 8760 hours. HbA1C: No results for input(s): HGBA1C in the last 72 hours. CBG: No results for input(s): GLUCAP in the last 168 hours. Lipid Profile: No results for input(s): CHOL, HDL, LDLCALC, TRIG, CHOLHDL, LDLDIRECT in the last 72 hours. Thyroid Function Tests: No results for input(s): TSH, T4TOTAL, FREET4, T3FREE, THYROIDAB in the last 72 hours. Anemia Panel: No results for input(s): VITAMINB12, FOLATE, FERRITIN, TIBC, IRON, RETICCTPCT in the last 72 hours. Urine analysis:    Component Value Date/Time   COLORURINE YELLOW 08/13/2016 1519   APPEARANCEUR CLOUDY (A) 08/13/2016 1519   LABSPEC 1.024 08/13/2016 1519   PHURINE 8.0 08/13/2016 1519   GLUCOSEU NEGATIVE 08/13/2016 1519   HGBUR NEGATIVE 08/13/2016 1519   BILIRUBINUR NEGATIVE 08/13/2016 1519   KETONESUR NEGATIVE 08/13/2016 1519   PROTEINUR 100 (A) 08/13/2016  1519   UROBILINOGEN 1.0 03/29/2014 1609   NITRITE POSITIVE (A) 08/13/2016 1519   LEUKOCYTESUR LARGE (A) 08/13/2016 1519    Creatinine Clearance: Estimated Creatinine Clearance: 37.3 mL/min (by C-G formula based on SCr of 1.21 mg/dL).  Sepsis Labs: @LABRCNTIP (procalcitonin:4,lacticidven:4) )No results found for this or any previous visit (from the past 240 hour(s)).   Radiological Exams on Admission: Dg Chest 2 View  Result Date: 08/13/2016 CLINICAL DATA:  Abdominal pain.  Chest are clear EXAM: CHEST  2 VIEW COMPARISON:  Radiograph 03/31/2014 FINDINGS: Patient's chin overlies the RIGHT upper lobe. Aorta is ectatic. The heart is enlarged. There is bibasilar atelectasis. No focal consolidation or pleural fluid. No pneumothorax. IMPRESSION: 1. Cardiomegaly and atelectasis. 2. Chest poorly evaluated secondary to patient positioning and low lung volumes. Electronically Signed   By: Suzy Bouchard M.D.   On: 08/13/2016 17:10   Ct Abdomen Pelvis W Contrast  Result Date: 08/13/2016 CLINICAL DATA:  Lower abdominal pain. EXAM: CT ABDOMEN AND PELVIS WITH CONTRAST TECHNIQUE: Multidetector CT imaging of the abdomen and pelvis was performed using the standard protocol following bolus administration of intravenous contrast. CONTRAST:  80 mL Isovue-300 COMPARISON:  04/10/2016 FINDINGS: Lower chest: Subsegmental atelectasis in the lung bases. No pleural effusion. Right atrial enlargement. Hepatobiliary: No focal liver abnormality is seen. No gallstones, gallbladder wall thickening, or biliary dilatation. Pancreas: Diffuse fatty infiltration as previously seen. Spleen: Similar appearance of approximately 7 cm heterogeneously enhancing mass inferiorly in the spleen which is likely benign since it was also present in 2009 and may represent a hemangioma. Adrenals/Urinary Tract: Unremarkable adrenal glands. No evidence of renal calculi or hydronephrosis. Unchanged hypodense lesions in the left kidney which likely  represent cysts and measure up to 2.7 cm in the lower pole. Unremarkable bladder. Stomach/Bowel: The stomach is within normal limits. There is marked gaseous distension of redundant mid to distal sigmoid colon which measures up to 11 cm in diameter. There is a swirled appearance of the mesentery at  the distal aspect of the dilated sigmoid loop with abrupt transition to decompressed distal sigmoid and rectum. A moderate amount of stool is present more proximally in the colon. There is no small bowel dilatation. Vascular/Lymphatic: Abdominal aortic atherosclerosis without aneurysm. No enlarged lymph nodes. Reproductive: Unremarkable prostate for age. Other: No intraperitoneal free fluid or free air. No abdominal wall mass or hernia. Musculoskeletal: Thoracolumbar scoliosis with advanced diffuse disc and facet degeneration. IMPRESSION: Recurrent sigmoid volvulus. Critical Value/emergent results were called by telephone at the time of interpretation on 08/13/2016 at 5:09 pm to Dr. Leo Grosser , who verbally acknowledged these results. Electronically Signed   By: Logan Bores M.D.   On: 08/13/2016 17:10    EKG: not done  Assessment/Plan Principal Problem:   Sigmoid volvulus (HCC) Active Problems:   CRI (chronic renal insufficiency)   Long term current use of anticoagulant therapy   Sigmoid volvulus -Patient now with recurrent sigmoid volvulus x 2 -Complete decompression was obtained today on flex sig, but a partial torsion remained -Following his prior volvulus, surgery was consulted to address the source of the problem -The patient declined surgery at that time -Cardiology did do a pre-operative clearance prior to the patient deciding not to have surgery and he was at acceptable risk for surgery; since there have been no obvious changes since the last visit, it would not seem necessary to obtain additional cardiology clearance at this time if surgery is desired -Would recommend serious discussion with  the patient in the AM regarding the ongoing risk of recurrence and the recommendation for surgical intervention -If desired, he should likely remain inpatient until he has had the repair -Will admit to med surg for ongoing monitoring and counseling regarding need for surgical intervention -Of note, he would also need anesthesia evaluation and input prior to surgery given his severe torticollis  Afib on anticoagulation -CHA2DS2-VASc score is at least 5, with a stroke rate of 6.7%/year -He is rate controlled on Cardizem at home -On Eliquis -Will hold Eliquis given his possible need for surgery -If no surgery, would suggest resuming tomorrow -If plan for surgery, he may need a Heparin drip to bridge him until he is appropriate for surgery and then able to resume the Eliquis post-operatively  CKD -Creatinine 1.21 -Baseline appears to be about 1 -There may be a small amount of volume deficiency present -Hold Lasix -Will give 75 cc/hr for 6 hours  -Starting clear liquids and advancing diet as tolerated for now so this should also provide rehydration  Other: -Glucose 132, may be stress related.  Regardless, unless marked hyperglycemia he likely does not need monitoring/treatment. -Albumin 3.4, mild. -Hgb 11.8, stable, follow -UA large LE, positive nitrite, 100 protein, TNTC WBC.  Does not report symptoms so will consider asymptomatic bacteriuria for now.  No treatment indicated unless he shows evidence of infection.  DVT prophylaxis: Eliquis today, now holding.  Will need prophylaxis tomorrow TBD. Code Status: DNR Family Communication: None present Disposition Plan: Back to [place] once clinically improved Consults called: GI; likely also needs surgery Admission status: Admit - It is my clinical opinion that admission to INPATIENT is reasonable and necessary because this patient will require at least 2 midnights in the hospital to treat this condition based on the medical complexity of  the problems presented.  Given the aforementioned information, the predictability of an adverse outcome is felt to be significant.    Karmen Bongo MD [facility]  If 7PM-7AM, please contact night-coverage [email or web address] Password [email or web address]  08/13/2016, 10:27 PM

## 2016-08-13 NOTE — Progress Notes (Signed)
CSW attempted to meet with pt but pt was asleep.  CSW was informed by RN pt is hard of hearing.  Pt was informed by RN that family was not present during her shift.    Alphonse Guild. Vianny Schraeder, Latanya Presser, LCAS Clinical Social Worker Ph: [phone]

## 2016-08-13 NOTE — ED Triage Notes (Signed)
PT RECEIVED FROM [place] [facility]IA EMS C/O CHRONIC ABDOMINAL PAIN. PER EMS, THE FACILITY STS THAT HE HAS A CHRONIC CONDITION OF HIS BOWEL TWISTING CAUSING PAIN, AND HE GETS SURGERY EVERY FEW MONTHS TO CORRECT IT. HIS LAST SURGERY WAS A MONTH OR TWO AGO. PT C/O ABDOMINAL PAIN TODAY 8/10.

## 2016-08-14 ENCOUNTER — Encounter (HOSPITAL_COMMUNITY): Payer: Self-pay | Admitting: Gastroenterology

## 2016-08-14 ENCOUNTER — Inpatient Hospital Stay (HOSPITAL_COMMUNITY): Payer: Medicare Other

## 2016-08-14 DIAGNOSIS — R140 Abdominal distension (gaseous): Secondary | ICD-10-CM

## 2016-08-14 DIAGNOSIS — N183 Chronic kidney disease, stage 3 (moderate): Secondary | ICD-10-CM

## 2016-08-14 DIAGNOSIS — D649 Anemia, unspecified: Secondary | ICD-10-CM

## 2016-08-14 LAB — CBC
HEMATOCRIT: 32.1 % — AB (ref 39.0–52.0)
HEMOGLOBIN: 10.5 g/dL — AB (ref 13.0–17.0)
MCH: 29 pg (ref 26.0–34.0)
MCHC: 32.7 g/dL (ref 30.0–36.0)
MCV: 88.7 fL (ref 78.0–100.0)
Platelets: 222 10*3/uL (ref 150–400)
RBC: 3.62 MIL/uL — ABNORMAL LOW (ref 4.22–5.81)
RDW: 15.9 % — ABNORMAL HIGH (ref 11.5–15.5)
WBC: 5.5 10*3/uL (ref 4.0–10.5)

## 2016-08-14 LAB — BASIC METABOLIC PANEL WITH GFR
Anion gap: 5 (ref 5–15)
BUN: 20 mg/dL (ref 6–20)
CO2: 28 mmol/L (ref 22–32)
Calcium: 8.7 mg/dL — ABNORMAL LOW (ref 8.9–10.3)
Chloride: 108 mmol/L (ref 101–111)
Creatinine, Ser: 0.88 mg/dL (ref 0.61–1.24)
GFR calc Af Amer: >60 mL/min
GFR calc non Af Amer: >60 mL/min
Glucose, Bld: 92 mg/dL (ref 65–99)
Potassium: 3.7 mmol/L (ref 3.5–5.1)
Sodium: 141 mmol/L (ref 135–145)

## 2016-08-14 MED ORDER — APIXABAN 5 MG PO TABS
5.0000 mg | ORAL_TABLET | Freq: Two times a day (BID) | ORAL | Status: DC
Start: 2016-08-14 — End: 2016-08-16
  Administered 2016-08-14 – 2016-08-16 (×4): 5 mg via ORAL
  Filled 2016-08-14 (×4): qty 1

## 2016-08-14 NOTE — Progress Notes (Signed)
[facility] Progress Note  Chief Complaint: Sigmoid volvulus  Subjective  History:  Eduardo Deleon is doing well today with no complaints of abdominal pain and he has no vomiting. He responded well to sigmoidoscopy for decompression of his volvulus last evening. See my note for details, as I feel there is a significant chronic component to this.  ROS: Cardiovascular:  no chest pain Respiratory: no dyspnea. He has a chronic cough as before.  Objective:  Med list reviewed  Vital signs in last 24 hrs: Vitals:   08/13/16 2034 08/14/16 0633  BP: 124/63 120/70  Pulse: 72 73  Resp: 20 18  Temp: 97.9 F (36.6 C) 98 F (36.7 C)    Physical Exam  Chronically ill-appearing man with severe torticollis of the neck. He is also quite hard of hearing  HEENT: sclera anicteric, oral mucosa moist without lesions or poor dentition  Cardiac: RRR without murmurs, S1S2 heard, no peripheral edema  Pulm: Low respiratory effort, rhonchi bilaterally, normal RR and effort noted  Abdomen: softly distended, no tenderness, with decreased bowel sounds and a palpable mid abdominal fullness. His exam is very similar to before his sigmoidoscopy last evening.  Skin; warm and dry, no jaundice or rash  Recent Labs:   Recent Labs Lab 08/13/16 1520 08/14/16 0438  WBC 9.7 5.5  HGB 11.8* 10.5*  HCT 36.6* 32.1*  PLT 247 222    Recent Labs Lab 08/13/16 1520 08/14/16 0438  NA 140 141  K 4.5 3.7  CL 104 108  CO2 28 28  BUN 25* 20  ALBUMIN 3.4*  --   ALKPHOS 75  --   ALT 24  --   AST 29  --   GLUCOSE 132* 92   No results for input(s): INR in the last 168 hours.   @ASSESSMENTPLANBEGIN @ Assessment: Recurrent sigmoid volvulus. It responded well to initial decompression, but has clearly recurred very quickly because this is a chronic problem. It is not amenable to further endoscopic decompression because it will not give a lasting result. Fortunately, he is tolerating it well with no abdominal  tenderness,signs of ischemic compromise or sepsis.   Plan: KUB today, which I suspect will probably show the classic sign of a recurrent volvulus. I have asked the surgical consultative team to evaluate him. I think this needs definitive surgical management. While he is certainly high risk for surgery, especially a sigmoid resection, I wonder if a less invasive options such as a sigmoidopexy may be beneficial.  I will back him off to a full liquid diet.  We will  sign off and you may call us as the need arises.   Nelida Meuse III Pager [phone] Mon-Fri 8a-5p [phone] after 5p, weekends, holidays

## 2016-08-14 NOTE — Progress Notes (Addendum)
PROGRESS NOTE    Eduardo Deleon  [id number] DOB: Oct 29, 1924 DOA: 08/13/2016 PCP: Gwendolyn Grant, MD   Brief Narrative: Eduardo Deleon is a 83 y.o. male with medical history significant of A. fib on Eliquis, CHF, CRI, history of prostate cancer, severe torticollis, colonic diverticulosis and recurrent sigmoid volvulus presenting with acute onset of abdominal pain this AM.  The pain was diffuse in his abdomen but by the time I saw him he was s/p sigmoidoscopy and decompression and was no longer having pain.  He did not have n/v with this episode.  Additional history was not able to be obtained due to patient's severe hearing loss as well as his limited historian abilities.GI was called and came to the bedside for flex sig sigmoid decompression which was successful. GI had nothing more to offer thinks he would need definitive surgical management so General Surgery was consulted. They feel like he is a poor surgical candidate and recommend continued medical management and sigmoidoscopy decompressions.   Assessment & Plan:   Principal Problem:   Sigmoid volvulus (HCC) Active Problems:   BPH (benign prostatic hyperplasia)   Anemia   Torticollis   CRI (chronic renal insufficiency)   Atrial fibrillation (HCC)   Long term current use of anticoagulant therapy  Recurrent Sigmoid volvulus -Patient now with recurrent sigmoid volvulus x 2 (since October) -Complete decompression was obtained today on flex sig, but a partial torsion remained -Following his prior volvulus, surgery was consulted to address the source of the problem -The patient declined surgery at that time -Cardiology did do a pre-operative clearance prior to the patient deciding not to have surgery and he was at acceptable risk for surgery; since there have been no obvious changes since the last visit, it would not seem necessary to obtain additional cardiology clearance at this time if surgery is desired --Of note, he would  also need anesthesia evaluation and input prior to surgery given his severe torticollis -Surgery Consulted and feel like best course is continued medical management and sigmoidoscopy decompressions as the patient is not interested in surgery  -Abdominal Flat plate showed There is a slightly gaseous distended loop of what appears to be sigmoid colon in the mid pelvis. This could represent a developing sigmoid volvulus which the patient apparently has had previously. No other bowel distention is seen. The bones are somewhat osteopenic -Continue to Monitor extremely closely.   Afib on anticoagulation -CHA2DS2-VASc score is at least 5, with a stroke rate of 6.7%/year -He is rate controlled on Cardizem at home 180 mg po Daily -On Eliquis -Restart Eliquis -If no surgery  CKD/CRI -Creatinine 1.21 improved to 0.88 -Baseline appears to be about 1 -There may be a small amount of volume deficiency present -HeldLasix -Gave IVF 75 cc/hr for 6 hours  -Starting clear liquids and advancing diet as tolerated for now so this should also provide rehydration  Asymptomatic Bacteruria -UA large LE, positive nitrite, 100 protein, TNTC WBC.  Does not report symptoms so will consider asymptomatic bacteriuria for now.  No treatment indicated unless he shows evidence of infection.  Anemia -Hgb 11.8 on Admission and dropped to 10.5 -Repeat CBC in AM  Hx of CVA with Severe Torticollis -Stable with no changes  BPH -C/w Doxazosin 2 mg po qHS and with Finasteride 5 mg po Daily  DVT prophylaxis: Anticoagulated with Apixaban Code Status: DO NOT RESUSCITATE Family Communication: None Disposition Plan: Back to SNF at [facility] once medially stable and evaluated by General Surgery in AM  Consultants:   Gastroenterology  General Surgery   Procedures:  Sigmoid Volvulus Decompression   Antimicrobials:  Anti-infectives    Start     Dose/Rate Route Frequency Ordered Stop   08/13/16 2115  azithromycin  (ZITHROMAX) tablet 250 mg    Comments:  ABT Start Date 08/11/16 & End Date 08/14/16.     250 mg Oral Daily 08/13/16 2111 08/14/16 0946     Subjective: Seen and examined and felt better and had no pain. Extremely hard of hearing. No Nausea. Wanting to talk to the surgeon.   Objective: Vitals:   08/13/16 1930 08/13/16 2034 08/14/16 0633 08/14/16 1400  BP: 114/73 124/63 120/70 (!) 79/60  Pulse: 85 72 73 (!) 56  Resp: 20 20 18 17   Temp:  97.9 F (36.6 C) 98 F (36.7 C) 98 F (36.7 C)  TempSrc:  Oral Oral Oral  SpO2: 99% 95% 95% 100%  Weight:      Height:        Intake/Output Summary (Last 24 hours) at 08/14/16 2013 Last data filed at 08/14/16 1800  Gross per 24 hour  Intake          1136.25 ml  Output               50 ml  Net          1086.25 ml   Filed Weights   08/13/16 1517  Weight: 65.8 kg (145 lb)    Examination: Physical Exam:  Constitutional: NAD and appears calm and comfortable Eyes:  Lds and conjunctivae normal, sclerae anicteric  ENMT: External Ears, Nose appear normal. Extremely hard of hearing Neck: Severe Torticollis but no visible masses Respiratory: Clear to auscultation bilaterally, no wheezing, rales, rhonchi or crackles. Normal respiratory effort and patient is not tachypenic. No accessory muscle use.  Cardiovascular: RRR, no murmurs / rubs / gallops. S1 and S2 auscultated. No extremity edema. Abdomen: Soft, non-tender, non-distended. No masses palpated. No appreciable hepatosplenomegaly. Bowel sounds positive x4.  GU: Deferred. Musculoskeletal: No clubbing / cyanosis of digits/nails.  Skin: No rashes, lesions, ulcers on limited skin evaluation. No induration; Warm and dry.  Neurologic: Grossly non-focal limited exam. Romberg sign cerebellar reflexes not assessed.  Psychiatric: Normal judgment and insight. Alert and awake. Normal mood and appropriate affect.   Data Reviewed: I have personally reviewed following labs and imaging studies  CBC:  Recent  Labs Lab 08/13/16 1520 08/14/16 0438  WBC 9.7 5.5  NEUTROABS 8.6*  --   HGB 11.8* 10.5*  HCT 36.6* 32.1*  MCV 87.1 88.7  PLT 247 [id number]   Basic Metabolic Panel:  Recent Labs Lab 08/13/16 1520 08/14/16 0438  NA 140 141  K 4.5 3.7  CL 104 108  CO2 28 28  GLUCOSE 132* 92  BUN 25* 20  CREATININE 1.21 0.88  CALCIUM 9.2 8.7*   GFR: Estimated Creatinine Clearance: 51.4 mL/min (by C-G formula based on SCr of 0.88 mg/dL). Liver Function Tests:  Recent Labs Lab 08/13/16 1520  AST 29  ALT 24  ALKPHOS 75  BILITOT 0.8  PROT 6.7  ALBUMIN 3.4*    Recent Labs Lab 08/13/16 1520  LIPASE 35   No results for input(s): AMMONIA in the last 168 hours. Coagulation Profile: No results for input(s): INR, PROTIME in the last 168 hours. Cardiac Enzymes: No results for input(s): CKTOTAL, CKMB, CKMBINDEX, TROPONINI in the last 168 hours. BNP (last 3 results) No results for input(s): PROBNP in the last 8760 hours. HbA1C: No results for  input(s): HGBA1C in the last 72 hours. CBG: No results for input(s): GLUCAP in the last 168 hours. Lipid Profile: No results for input(s): CHOL, HDL, LDLCALC, TRIG, CHOLHDL, LDLDIRECT in the last 72 hours. Thyroid Function Tests: No results for input(s): TSH, T4TOTAL, FREET4, T3FREE, THYROIDAB in the last 72 hours. Anemia Panel: No results for input(s): VITAMINB12, FOLATE, FERRITIN, TIBC, IRON, RETICCTPCT in the last 72 hours. Sepsis Labs:  Recent Labs Lab 08/13/16 1537  LATICACIDVEN 0.94    Recent Results (from the past 240 hour(s))  MRSA PCR Screening     Status: Abnormal   Collection Time: 08/13/16  8:30 PM  Result Value Ref Range Status   MRSA by PCR POSITIVE (A) NEGATIVE Final    Comment:        The GeneXpert MRSA Assay (FDA approved for NASAL specimens only), is one component of a comprehensive MRSA colonization surveillance program. It is not intended to diagnose MRSA infection nor to guide or monitor treatment for MRSA  infections. RESULT CALLED TO, READ BACK BY AND VERIFIED WITH: GOOD,J RN 2.19.18 @2311  ZANDO,C     Radiology Studies: Dg Chest 2 View  Result Date: 08/13/2016 CLINICAL DATA:  Abdominal pain.  Chest are clear EXAM: CHEST  2 VIEW COMPARISON:  Radiograph 03/31/2014 FINDINGS: Patient's chin overlies the RIGHT upper lobe. Aorta is ectatic. The heart is enlarged. There is bibasilar atelectasis. No focal consolidation or pleural fluid. No pneumothorax. IMPRESSION: 1. Cardiomegaly and atelectasis. 2. Chest poorly evaluated secondary to patient positioning and low lung volumes. Electronically Signed   By: Suzy Bouchard M.D.   On: 08/13/2016 17:10   Dg Abd 1 View  Result Date: 08/14/2016 CLINICAL DATA:  Abdominal pain for several days, moderate abdominal distention EXAM: ABDOMEN - 1 VIEW COMPARISON:  CT abdomen pelvis of 08/13/2016 FINDINGS: There is a slightly gaseous distended loop of what appears to be sigmoid colon in the mid pelvis. This could represent a developing sigmoid volvulus which the patient apparently has had previously. No other bowel distention is seen. The bones are somewhat osteopenic. IMPRESSION: Some gaseous distention of what appears to be sigmoid colon in the mid pelvis may indicate early development of sigmoid volvulus which the patient has reportedly had previously. Electronically Signed   By: Ivar Drape M.D.   On: 08/14/2016 09:15   Ct Abdomen Pelvis W Contrast  Result Date: 08/13/2016 CLINICAL DATA:  Lower abdominal pain. EXAM: CT ABDOMEN AND PELVIS WITH CONTRAST TECHNIQUE: Multidetector CT imaging of the abdomen and pelvis was performed using the standard protocol following bolus administration of intravenous contrast. CONTRAST:  80 mL Isovue-300 COMPARISON:  04/10/2016 FINDINGS: Lower chest: Subsegmental atelectasis in the lung bases. No pleural effusion. Right atrial enlargement. Hepatobiliary: No focal liver abnormality is seen. No gallstones, gallbladder wall thickening, or  biliary dilatation. Pancreas: Diffuse fatty infiltration as previously seen. Spleen: Similar appearance of approximately 7 cm heterogeneously enhancing mass inferiorly in the spleen which is likely benign since it was also present in 2009 and may represent a hemangioma. Adrenals/Urinary Tract: Unremarkable adrenal glands. No evidence of renal calculi or hydronephrosis. Unchanged hypodense lesions in the left kidney which likely represent cysts and measure up to 2.7 cm in the lower pole. Unremarkable bladder. Stomach/Bowel: The stomach is within normal limits. There is marked gaseous distension of redundant mid to distal sigmoid colon which measures up to 11 cm in diameter. There is a swirled appearance of the mesentery at the distal aspect of the dilated sigmoid loop with abrupt transition to  decompressed distal sigmoid and rectum. A moderate amount of stool is present more proximally in the colon. There is no small bowel dilatation. Vascular/Lymphatic: Abdominal aortic atherosclerosis without aneurysm. No enlarged lymph nodes. Reproductive: Unremarkable prostate for age. Other: No intraperitoneal free fluid or free air. No abdominal wall mass or hernia. Musculoskeletal: Thoracolumbar scoliosis with advanced diffuse disc and facet degeneration. IMPRESSION: Recurrent sigmoid volvulus. Critical Value/emergent results were called by telephone at the time of interpretation on 08/13/2016 at 5:09 pm to Dr. Leo Grosser , who verbally acknowledged these results. Electronically Signed   By: Logan Bores M.D.   On: 08/13/2016 17:10   Scheduled Meds: . apixaban  5 mg Oral BID  . Chlorhexidine Gluconate Cloth  6 each Topical Q0600  . diltiazem  180 mg Oral Daily  . doxazosin  2 mg Oral QHS  . finasteride  5 mg Oral Daily  . pseudoephedrine  60 mg Oral Q12H   Or  . guaiFENesin  600 mg Oral Q12H  . magnesium oxide  400 mg Oral Daily  . mupirocin ointment  1 application Nasal BID  . pantoprazole  40 mg Oral Daily  .  polyethylene glycol  17 g Oral Daily   Continuous Infusions:   LOS: 1 day   Kerney Elbe, DO [facility] Pager [phone]  If 7PM-7AM, please contact night-coverage [email or web address] Password [email or web address] 08/14/2016, 8:13 PM

## 2016-08-14 NOTE — Consult Note (Signed)
Reason for Consult: Recurrent volvulus Referring Physician: Dr. Wilfrid Lund III  Eduardo Deleon is an 83 y.o. male.  HPI: He is transferred from [place] facility via EMS with chronic abdominal pain.  He has had chronic issues with his bowel pain and distension. Workup in the emergency department shows he  was afebrile vital signs are stable.CMP is essentially normal., WBC 5.5, hemoglobin 10.5, hematocrit 3.2, platelets 222,000. CT scan shows atelectasis in the lung bases, no effusion, and right atrial enlargement. There was marked gaseous distention of redundant mid to distal sigmoid colon which measured up to 11 cm.  He was seen by Dr. Wilfrid Lund, it was his opinion the patient had a recurrent sigmoid volvulus and required emergent decompression. Patient was taken to the endoscopy suite and underwent endoscopic decompression by Dr. Loletha Carrow. The following a.m. he was doing well after decompression. Repeat film this a.m. shows probable recurrent volvulus. And we were asked to see.  Patient hospitalized 04/10/16 - 04/12/16 with sigmoid volvulus he underwent decompression by Dr. Hilarie Fredrickson and was ultimately discharged home. It was determined that he did not really want to have surgery. With his multiple comorbidities he was at high risk. Labs today shows he is remained afebrile with stable vital signs. CBC and CMP are stable. Single view abdominal film shows gaseous distention in what appears to be sigmoid colon in which the mid pelvis may indicate early development of sigmoid volvulus.  We are ask to see for possible resection of the recurring sigmoid volvulus.  Past Medical History:  Diagnosis Date  . Anal fissure    Hx of  . Anemia   . Atrial fibrillation (HCC)    chronic anticaog - weintraub  . Congestive heart failure ([place])   . CRI (chronic renal insufficiency)   . Diverticulosis 04/12/1998   Left colon--Flex Sig  . GERD (gastroesophageal reflux disease)   . Internal hemorrhoids    . Osteoarthritis   . Prostate cancer ([place])   . PVD (peripheral vascular disease) ([place])   . 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 Splenic lesion   . Stroke [facility]) 1978 lower brain stem  . Torticollis, acquired   . Tubular adenoma   . Venous stasis dermatitis    hx venous ulcer/cellulitis 06/2011 R and 04/2011 L    Past Surgical History:  Procedure Laterality Date  . COLONOSCOPY     1988  . DIRECT LARYNGOSCOPY N/A 03/31/2014   Procedure: DIRECT LARYNGOSCOPY/EXAM UNDER ANESTHESIA;  Surgeon: [name], MD;  Location: [place];  Service: ENT;  Laterality: N/A;  . FLEXIBLE SIGMOIDOSCOPY N/A 04/10/2016   Procedure: FLEXIBLE SIGMOIDOSCOPY;  Surgeon: Jerene Bears, MD;  Location: [facility] ENDOSCOPY;  Service: Endoscopy;  Laterality: N/A;  . HEMORRHOID SURGERY    . HERNIA REPAIR     left-1982, right - 1985  . JOINT REPLACEMENT  bilaterial knees  . LUMBAR LAMINECTOMY  Per patient  heriated disc in mid spine  . TONSILLECTOMY      Family History  Problem Relation Age of Onset  . Heart disease Father   . Breast cancer Mother   . Diabetes Brother   . Diabetes      grandmother  . Colon cancer Neg Hx     Social History:  reports that he has never smoked. He has never used smokeless tobacco. He reports that he does not drink alcohol or use drugs.  Allergies: No Known Allergies  Prior to Admission medications   Medication  Sig Start Date End Date Taking? Authorizing Provider  acetaminophen (TYLENOL) 325 MG tablet Take 650 mg by mouth every 6 (six) hours as needed for moderate pain.   Yes Historical Provider, MD  apixaban (ELIQUIS) 5 MG TABS tablet Take 1 tablet (5 mg total) by mouth 2 (two) times daily. 04/12/16  Yes Barton Dubois, MD  azithromycin (ZITHROMAX) 250 MG tablet Take 250 mg by mouth as directed. ABT Start Date 08/11/16 & End Date 08/14/16.   Yes Historical Provider, MD  cholecalciferol (VITAMIN D) 1000 UNITS tablet Take 1,000 Units by mouth daily.    Yes Historical  Provider, MD  diltiazem (CARDIZEM CD) 180 MG 24 hr capsule Take 1 capsule (180 mg total) by mouth daily. 04/07/13  Yes Lorretta Harp, MD  doxazosin (CARDURA) 2 MG tablet Take 2 mg by mouth at bedtime.     Yes Historical Provider, MD  finasteride (PROSCAR) 5 MG tablet Take 5 mg by mouth daily.   Yes Historical Provider, MD  furosemide (LASIX) 20 MG tablet Take 20 mg by mouth daily.   Yes Historical Provider, MD  iron polysaccharides (FERREX 150) 150 MG capsule Take 1 capsule (150 mg total) by mouth daily. 09/25/13  Yes Troy Sine, MD  magnesium oxide (MAG-OX) 400 MG tablet Take 400 mg by mouth daily.   Yes Historical Provider, MD  Multiple Vitamins-Minerals (PRESERVISION AREDS 2 PO) Take 1 capsule by mouth 2 (two) times daily.   Yes Historical Provider, MD  pantoprazole (PROTONIX) 40 MG tablet Take 1 tablet (40 mg total) by mouth daily. 04/13/14  Yes Charlynne Cousins, MD  polyethylene glycol [facility] / Floria Raveling) packet Take 17 g by mouth daily. 04/12/16  Yes Barton Dubois, MD  potassium chloride (KLOR-CON) 10 MEQ CR tablet Take 10 mEq by mouth daily.    Yes Historical Provider, MD  pseudoephedrine-guaifenesin (MUCINEX D) 60-600 MG 12 hr tablet Take 1 tablet by mouth every 12 (twelve) hours. 7 Day Course. Start Date 08/10/16 & End Date 08/17/16.   Yes Historical Provider, MD     Results for orders placed or performed during the hospital encounter of 08/13/16 (from the past 48 hour(s))  Urinalysis, Routine w reflex microscopic     Status: Abnormal   Collection Time: 08/13/16  3:19 PM  Result Value Ref Range   Color, Urine YELLOW YELLOW   APPearance CLOUDY (A) CLEAR   Specific Gravity, Urine 1.024 1.005 - 1.030   pH 8.0 5.0 - 8.0   Glucose, UA NEGATIVE NEGATIVE mg/dL   Hgb urine dipstick NEGATIVE NEGATIVE   Bilirubin Urine NEGATIVE NEGATIVE   Ketones, ur NEGATIVE NEGATIVE mg/dL   Protein, ur 100 (A) NEGATIVE mg/dL   Nitrite POSITIVE (A) NEGATIVE   Leukocytes, UA LARGE (A) NEGATIVE    RBC / HPF 0-5 0 - 5 RBC/hpf   WBC, UA TOO NUMEROUS TO COUNT 0 - 5 WBC/hpf   Bacteria, UA RARE (A) NONE SEEN   Squamous Epithelial / LPF 6-30 (A) NONE SEEN   Mucous PRESENT    Triple Phosphate Crystal PRESENT   CBC with Differential     Status: Abnormal   Collection Time: 08/13/16  3:20 PM  Result Value Ref Range   WBC 9.7 4.0 - 10.5 K/uL   RBC 4.20 (L) 4.22 - 5.81 MIL/uL   Hemoglobin 11.8 (L) 13.0 - 17.0 g/dL   HCT 36.6 (L) 39.0 - 52.0 %   MCV 87.1 78.0 - 100.0 fL   MCH 28.1 26.0 - 34.0 pg  MCHC 32.2 30.0 - 36.0 g/dL   RDW 15.7 (H) 11.5 - 15.5 %   Platelets 247 150 - 400 K/uL   Neutrophils Relative % 88 %   Neutro Abs 8.6 (H) 1.7 - 7.7 K/uL   Lymphocytes Relative 8 %   Lymphs Abs 0.7 0.7 - 4.0 K/uL   Monocytes Relative 4 %   Monocytes Absolute 0.4 0.1 - 1.0 K/uL   Eosinophils Relative 0 %   Eosinophils Absolute 0.0 0.0 - 0.7 K/uL   Basophils Relative 0 %   Basophils Absolute 0.0 0.0 - 0.1 K/uL  Comprehensive metabolic panel     Status: Abnormal   Collection Time: 08/13/16  3:20 PM  Result Value Ref Range   Sodium 140 135 - 145 mmol/L   Potassium 4.5 3.5 - 5.1 mmol/L   Chloride 104 101 - 111 mmol/L   CO2 28 22 - 32 mmol/L   Glucose, Bld 132 (H) 65 - 99 mg/dL   BUN 25 (H) 6 - 20 mg/dL   Creatinine, Ser 1.21 0.61 - 1.24 mg/dL   Calcium 9.2 8.9 - 10.3 mg/dL   Total Protein 6.7 6.5 - 8.1 g/dL   Albumin 3.4 (L) 3.5 - 5.0 g/dL   AST 29 15 - 41 U/L   ALT 24 17 - 63 U/L   Alkaline Phosphatase 75 38 - 126 U/L   Total Bilirubin 0.8 0.3 - 1.2 mg/dL   GFR calc non Af Amer 51 (L) >60 mL/min   GFR calc Af Amer 59 (L) >60 mL/min    Comment: (NOTE) The eGFR has been calculated using the CKD EPI equation. This calculation has not been validated in all clinical situations. eGFR's persistently <60 mL/min signify possible Chronic Kidney Disease.    Anion gap 8 5 - 15  Lipase, blood     Status: None   Collection Time: 08/13/16  3:20 PM  Result Value Ref Range   Lipase 35 11 - 51  U/L  I-Stat CG4 Lactic Acid, ED     Status: None   Collection Time: 08/13/16  3:37 PM  Result Value Ref Range   Lactic Acid, Venous 0.94 0.5 - 1.9 mmol/L  MRSA PCR Screening     Status: Abnormal   Collection Time: 08/13/16  8:30 PM  Result Value Ref Range   MRSA by PCR POSITIVE (A) NEGATIVE    Comment:        The GeneXpert MRSA Assay (FDA approved for NASAL specimens only), is one component of a comprehensive MRSA colonization surveillance program. It is not intended to diagnose MRSA infection nor to guide or monitor treatment for MRSA infections. RESULT CALLED TO, READ BACK BY AND VERIFIED WITH: GOOD,J RN 2.19.18 @2311  ZANDO,C   Basic metabolic panel     Status: Abnormal   Collection Time: 08/14/16  4:38 AM  Result Value Ref Range   Sodium 141 135 - 145 mmol/L   Potassium 3.7 3.5 - 5.1 mmol/L    Comment: DELTA CHECK NOTED REPEATED TO VERIFY    Chloride 108 101 - 111 mmol/L   CO2 28 22 - 32 mmol/L   Glucose, Bld 92 65 - 99 mg/dL   BUN 20 6 - 20 mg/dL   Creatinine, Ser 0.88 0.61 - 1.24 mg/dL   Calcium 8.7 (L) 8.9 - 10.3 mg/dL   GFR calc non Af Amer >60 >60 mL/min   GFR calc Af Amer >60 >60 mL/min    Comment: (NOTE) The eGFR has been calculated using the CKD  EPI equation. This calculation has not been validated in all clinical situations. eGFR's persistently <60 mL/min signify possible Chronic Kidney Disease.    Anion gap 5 5 - 15  CBC     Status: Abnormal   Collection Time: 08/14/16  4:38 AM  Result Value Ref Range   WBC 5.5 4.0 - 10.5 K/uL   RBC 3.62 (L) 4.22 - 5.81 MIL/uL   Hemoglobin 10.5 (L) 13.0 - 17.0 g/dL   HCT 32.1 (L) 39.0 - 52.0 %   MCV 88.7 78.0 - 100.0 fL   MCH 29.0 26.0 - 34.0 pg   MCHC 32.7 30.0 - 36.0 g/dL   RDW 15.9 (H) 11.5 - 15.5 %   Platelets 222 150 - 400 K/uL    Dg Chest 2 View  Result Date: 08/13/2016 CLINICAL DATA:  Abdominal pain.  Chest are clear EXAM: CHEST  2 VIEW COMPARISON:  Radiograph 03/31/2014 FINDINGS: Patient's chin  overlies the RIGHT upper lobe. Aorta is ectatic. The heart is enlarged. There is bibasilar atelectasis. No focal consolidation or pleural fluid. No pneumothorax. IMPRESSION: 1. Cardiomegaly and atelectasis. 2. Chest poorly evaluated secondary to patient positioning and low lung volumes. Electronically Signed   By: Suzy Bouchard M.D.   On: 08/13/2016 17:10   Dg Abd 1 View  Result Date: 08/14/2016 CLINICAL DATA:  Abdominal pain for several days, moderate abdominal distention EXAM: ABDOMEN - 1 VIEW COMPARISON:  CT abdomen pelvis of 08/13/2016 FINDINGS: There is a slightly gaseous distended loop of what appears to be sigmoid colon in the mid pelvis. This could represent a developing sigmoid volvulus which the patient apparently has had previously. No other bowel distention is seen. The bones are somewhat osteopenic. IMPRESSION: Some gaseous distention of what appears to be sigmoid colon in the mid pelvis may indicate early development of sigmoid volvulus which the patient has reportedly had previously. Electronically Signed   By: Ivar Drape M.D.   On: 08/14/2016 09:15   Ct Abdomen Pelvis W Contrast  Result Date: 08/13/2016 CLINICAL DATA:  Lower abdominal pain. EXAM: CT ABDOMEN AND PELVIS WITH CONTRAST TECHNIQUE: Multidetector CT imaging of the abdomen and pelvis was performed using the standard protocol following bolus administration of intravenous contrast. CONTRAST:  80 mL Isovue-300 COMPARISON:  04/10/2016 FINDINGS: Lower chest: Subsegmental atelectasis in the lung bases. No pleural effusion. Right atrial enlargement. Hepatobiliary: No focal liver abnormality is seen. No gallstones, gallbladder wall thickening, or biliary dilatation. Pancreas: Diffuse fatty infiltration as previously seen. Spleen: Similar appearance of approximately 7 cm heterogeneously enhancing mass inferiorly in the spleen which is likely benign since it was also present in 2009 and may represent a hemangioma. Adrenals/Urinary Tract:  Unremarkable adrenal glands. No evidence of renal calculi or hydronephrosis. Unchanged hypodense lesions in the left kidney which likely represent cysts and measure up to 2.7 cm in the lower pole. Unremarkable bladder. Stomach/Bowel: The stomach is within normal limits. There is marked gaseous distension of redundant mid to distal sigmoid colon which measures up to 11 cm in diameter. There is a swirled appearance of the mesentery at the distal aspect of the dilated sigmoid loop with abrupt transition to decompressed distal sigmoid and rectum. A moderate amount of stool is present more proximally in the colon. There is no small bowel dilatation. Vascular/Lymphatic: Abdominal aortic atherosclerosis without aneurysm. No enlarged lymph nodes. Reproductive: Unremarkable prostate for age. Other: No intraperitoneal free fluid or free air. No abdominal wall mass or hernia. Musculoskeletal: Thoracolumbar scoliosis with advanced diffuse disc and facet  degeneration. IMPRESSION: Recurrent sigmoid volvulus. Critical Value/emergent results were called by telephone at the time of interpretation on 08/13/2016 at 5:09 pm to Dr. Leo Grosser , who verbally acknowledged these results. Electronically Signed   By: Logan Bores M.D.   On: 08/13/2016 17:10    Review of Systems  Unable to perform ROS: Other  Constitutional:       He is so deaf I could not do ROS   Blood pressure 120/70, pulse 73, temperature 98 F (36.7 C), temperature source Oral, resp. rate 18, height 5' 6"  (1.676 m), weight 65.8 kg (145 lb), SpO2 95 %. Physical Exam  Constitutional: He is oriented to person, place, and time. No distress.  Chronically ill, frail appearing, 83 year old with severe torticollis.  HENT:  Head: Normocephalic and atraumatic.  Mouth/Throat: Oropharynx is clear and moist.  Eyes: Right eye exhibits no discharge. Left eye exhibits no discharge. No scleral icterus.  Neck: Normal range of motion. Neck supple. No JVD present. No  tracheal deviation present. No thyromegaly present.  Cardiovascular: Normal rate, regular rhythm, normal heart sounds and intact distal pulses.   No murmur heard. Respiratory: Effort normal. No stridor. No respiratory distress. He has no wheezes. He has rales (some rhochi with breathing, ). He exhibits no tenderness.  GI: He exhibits distension. He exhibits no mass. There is tenderness (minimal). There is no rebound and no guarding.  BS are hyperactive  Musculoskeletal: He exhibits no edema or tenderness.  Lymphadenopathy:    He has no cervical adenopathy.  Neurological: He is alert and oriented to person, place, and time. No cranial nerve deficit.  Skin: Skin is warm and dry. No rash noted. He is not diaphoretic. No erythema. No pallor.  Psychiatric: He has a normal mood and affect. His behavior is normal. Judgment and thought content normal.    Assessment/Plan: Recurrent sigmoid volvulus Atrial fibrillation on Eliquis/last dose 08/13/16 History of congestive heart failure History of chronic renal insufficiency Severe torticollis History of prostate cancer History of CVA Osteoarthritis GERD  Plan: Patient has a complex set of medical issues. He would need medical/cardiology clearance. We will need anesthesia to evaluate and see if they can safely intubate him, before recommending any surgery. This is secondary episode since October.   Dr. Lucia Gaskins will review and discuss with the patient and any available family.  JENNINGS,WILLARD 08/14/2016, 1:38 PM   Agree with above.  This is a difficult patient for several reason.  His poor hearing makes it extremely hard to explain to him what is going on.  I'm not sure he understands his diagnosis. He is not interested in surgery at this time and thinks he is going back to [employer] [facility] tomorrow. His only relative is his wife - who is at [facility] and sounds like she has some mild dementia.  He has no children, no family, and no medical power  of attorney.  Will spent 45 minutes with him going over his diagnosis.  I spent another 30+ minutes going over his diagnosis and drawing pictures for him, but I still don't think he understands it.  His best course for now is continued medical management and sigmoidoscopy decompressions.  [name], MD, [facility] Pager: [phone] Office phone:  [phone]

## 2016-08-14 NOTE — Progress Notes (Signed)
Initial Nutrition Assessment  DOCUMENTATION CODES:   Not applicable  INTERVENTION:    Boost Breeze po TID, each supplement provides 250 kcal and 9 grams of protein  NUTRITION DIAGNOSIS:   Increased nutrient needs related to acute illness as evidenced by estimated needs.  GOAL:   Patient will meet greater than or equal to 90% of their needs  MONITOR:   PO intake, Supplement acceptance, Labs, Weight trends, Skin, I & O's  REASON FOR ASSESSMENT:   Malnutrition Screening Tool  ASSESSMENT:    83 y.o. Male with medical history significant of A. fib on Eliquis, CHF, CRI, history of prostate cancer, severe torticollis, colonic diverticulosis and recurrent sigmoid volvulus presenting with acute onset of abdominal pain this AM.  The pain was diffuse in his abdomen but by the time I saw him he was s/p sigmoidoscopy and decompression and was no longer having pain.  He did not have n/v with this episode.  Additional history was not able to be obtained due to patient's severe hearing loss as well as his limited historian abilities.  Pt admitted from [facility]. Currently in [place]. S/p flexible sigmoidoscopy 2/19 >> impression >> volvulus. Per Malnutrition Screening Tool Report, pt has been eating poorly because of a decreased appetite. Labs and medications reviewed.  RD unable to complete Nutrition Focused Physical Exam at this time.  Diet Order:  Diet clear liquid Room service appropriate? Yes; Fluid consistency: Thin  Skin:  Reviewed, no issues  Last BM:  PTA  Height:   Ht Readings from Last 1 Encounters:  08/13/16 5\' 6"  (1.676 m)    Weight:   Wt Readings from Last 1 Encounters:  08/13/16 145 lb (65.8 kg)    Ideal Body Weight:  64.5 kg  BMI:  Body mass index is 23.4 kg/m.  Estimated Nutritional Needs:   Kcal:  1650-1850  Protein:  75-85 gm  Fluid:  1.6-1.8 L  EDUCATION NEEDS:   No education needs identified at this time  Arthur Holms, RD, LDN Pager #: [phone] After-Hours Pager #: [phone]

## 2016-08-15 ENCOUNTER — Inpatient Hospital Stay (HOSPITAL_COMMUNITY): Payer: Medicare Other

## 2016-08-15 DIAGNOSIS — N400 Enlarged prostate without lower urinary tract symptoms: Secondary | ICD-10-CM

## 2016-08-15 DIAGNOSIS — M436 Torticollis: Secondary | ICD-10-CM

## 2016-08-15 DIAGNOSIS — I482 Chronic atrial fibrillation: Secondary | ICD-10-CM

## 2016-08-15 DIAGNOSIS — Z7901 Long term (current) use of anticoagulants: Secondary | ICD-10-CM

## 2016-08-15 LAB — CBC WITH DIFFERENTIAL/PLATELET
Basophils Absolute: 0 10*3/uL (ref 0.0–0.1)
Basophils Relative: 0 %
EOS PCT: 1 %
Eosinophils Absolute: 0.1 10*3/uL (ref 0.0–0.7)
HEMATOCRIT: 33.2 % — AB (ref 39.0–52.0)
Hemoglobin: 10.6 g/dL — ABNORMAL LOW (ref 13.0–17.0)
LYMPHS ABS: 1.3 10*3/uL (ref 0.7–4.0)
LYMPHS PCT: 28 %
MCH: 27.7 pg (ref 26.0–34.0)
MCHC: 31.9 g/dL (ref 30.0–36.0)
MCV: 86.9 fL (ref 78.0–100.0)
Monocytes Absolute: 0.3 10*3/uL (ref 0.1–1.0)
Monocytes Relative: 7 %
NEUTROS ABS: 3.1 10*3/uL (ref 1.7–7.7)
Neutrophils Relative %: 64 %
PLATELETS: 216 10*3/uL (ref 150–400)
RBC: 3.82 MIL/uL — AB (ref 4.22–5.81)
RDW: 16 % — ABNORMAL HIGH (ref 11.5–15.5)
WBC: 4.8 10*3/uL (ref 4.0–10.5)

## 2016-08-15 LAB — COMPREHENSIVE METABOLIC PANEL
ALK PHOS: 68 U/L (ref 38–126)
ALT: 22 U/L (ref 17–63)
AST: 27 U/L (ref 15–41)
Albumin: 3 g/dL — ABNORMAL LOW (ref 3.5–5.0)
Anion gap: 5 (ref 5–15)
BILIRUBIN TOTAL: 0.7 mg/dL (ref 0.3–1.2)
BUN: 14 mg/dL (ref 6–20)
CALCIUM: 8.7 mg/dL — AB (ref 8.9–10.3)
CHLORIDE: 109 mmol/L (ref 101–111)
CO2: 27 mmol/L (ref 22–32)
CREATININE: 0.84 mg/dL (ref 0.61–1.24)
Glucose, Bld: 84 mg/dL (ref 65–99)
Potassium: 3.8 mmol/L (ref 3.5–5.1)
Sodium: 141 mmol/L (ref 135–145)
TOTAL PROTEIN: 5.8 g/dL — AB (ref 6.5–8.1)

## 2016-08-15 LAB — URINALYSIS, ROUTINE W REFLEX MICROSCOPIC
Bilirubin Urine: NEGATIVE
Glucose, UA: NEGATIVE mg/dL
HGB URINE DIPSTICK: NEGATIVE
Ketones, ur: NEGATIVE mg/dL
Nitrite: POSITIVE — AB
PROTEIN: NEGATIVE mg/dL
SPECIFIC GRAVITY, URINE: 1.008 (ref 1.005–1.030)
SQUAMOUS EPITHELIAL / LPF: NONE SEEN
pH: 7 (ref 5.0–8.0)

## 2016-08-15 LAB — PHOSPHORUS: PHOSPHORUS: 3.4 mg/dL (ref 2.5–4.6)

## 2016-08-15 LAB — MAGNESIUM: MAGNESIUM: 2 mg/dL (ref 1.7–2.4)

## 2016-08-15 NOTE — Progress Notes (Addendum)
[facility]  PROGRESS NOTE  Eduardo Deleon [id number] DOB: 04/04/1925 DOA: 08/13/2016 PCP: Gwendolyn Grant, MD   Brief HPI:    83 y.o.malewith medical history significant of A. fib on Eliquis, CHF, CRI, history of prostate cancer, severe torticollis, colonic diverticulosis and recurrent sigmoid volvulus presenting with acute onset of abdominal pain this AM. The pain was diffuse in his abdomen but by the time I saw him he was s/p sigmoidoscopy and decompression and was no longer having pain. He did not have n/v with this episode. Additional history was not able to be obtained due to patient's severe hearing loss as well as his limited historian abilities.GI was called and came to the bedside for flex sig sigmoid decompression which was successful. GI had nothing more to offer thinks he would need definitive surgical management so General Surgery was consulted. They feel like he is a poor surgical candidate and recommend continued medical management and sigmoidoscopy decompressions.    Subjective   Patient denies pain this morning.   Assessment/Plan:     1. Recurrent sigmoid volvulus- patient has history of recurrent sigmoid volvulus, last in October. Complete decompression was obtained on flexible sigmoidoscopy but partial torsion remained. Surgery was consulted during previous admission for volvulus at that time he declined surgery.-Cardiology did do a pre-operative clearance prior to the patient deciding not to have surgery and he was at acceptable risk for surgery; since there have been no obvious changes since the last visit, it would not seem necessary to obtain additional cardiology clearance at this time if surgery is desired. General surgery was consulted and recommended medical management with sigmoidoscopy decompressions as needed. 2. Atrial fibrillation- CHA2DS2VASc score is 5. Heart rate is controlled, continue Cardizem 180 mg by mouth daily. Continue  eliquis. 3. Abnormal UA- patient had abnormal UA with positive nitrite, when he came to ED on 08/13/2016. Repeat UA and culture. Patient has been asymptomatic but is incontinent of urine. 4. History of CVA with severe torticollis- stable. 5. BPH- continue doxazosin 2 mg by mouth daily at bedtime and finasteride 5 mg by mouth daily    DVT prophylaxis: on Apixaban  Code Status: DO NOT RESUSCITATE  Family Communication: No family present at bedside  Disposition Plan:  Skilled nursing facility once okay with general surgery    Consultants:  Gastroenterology  Gen. surgery  Procedures:  Flexible sigmoidoscopy for sigmoid volvulus decompression       Antibiotics:   Anti-infectives    Start     Dose/Rate Route Frequency Ordered Stop   08/13/16 2115  azithromycin (ZITHROMAX) tablet 250 mg    Comments:  ABT Start Date 08/11/16 & End Date 08/14/16.     250 mg Oral Daily 08/13/16 2111 08/14/16 0946       Objective   Vitals:   08/14/16 1400 08/14/16 2134 08/15/16 0631 08/15/16 1400  BP: (!) 79/60 121/80 130/65 128/68  Pulse: (!) 56 68 72 66  Resp: 17 18 18 17   Temp: 98 F (36.7 C) 97.5 F (36.4 C) 98 F (36.7 C) 98.6 F (37 C)  TempSrc: Oral Oral Oral Oral  SpO2: 100% 94% 96% 98%  Weight:      Height:        Intake/Output Summary (Last 24 hours) at 08/15/16 1616 Last data filed at 08/15/16 1400  Gross per 24 hour  Intake              840 ml  Output  0 ml  Net              840 ml   Filed Weights   08/13/16 1517  Weight: 65.8 kg (145 lb)     Physical Examination:  General exam: Appears calm and comfortable. Respiratory system: Clear to auscultation. Respiratory effort normal. Cardiovascular system:  RRR. No  murmurs, rubs, gallops. No pedal edema. GI system: Abdomen is distended, soft and nontender. No organomegaly.  Central nervous system. No focal neurological deficits. 5 x 5 power in all extremities. Skin: No rashes, lesions or  ulcers. Psychiatry: Alert, oriented x 3.Judgement and insight appear normal. Affect normal.    Data Reviewed: I have personally reviewed following labs and imaging studies  CBG: No results for input(s): GLUCAP in the last 168 hours.  CBC:  Recent Labs Lab 08/13/16 1520 08/14/16 0438 08/15/16 0530  WBC 9.7 5.5 4.8  NEUTROABS 8.6*  --  3.1  HGB 11.8* 10.5* 10.6*  HCT 36.6* 32.1* 33.2*  MCV 87.1 88.7 86.9  PLT 247 222 [id number]    Basic Metabolic Panel:  Recent Labs Lab 08/13/16 1520 08/14/16 0438 08/15/16 0530  NA 140 141 141  K 4.5 3.7 3.8  CL 104 108 109  CO2 28 28 27   GLUCOSE 132* 92 84  BUN 25* 20 14  CREATININE 1.21 0.88 0.84  CALCIUM 9.2 8.7* 8.7*  MG  --   --  2.0  PHOS  --   --  3.4    Recent Results (from the past 240 hour(s))  MRSA PCR Screening     Status: Abnormal   Collection Time: 08/13/16  8:30 PM  Result Value Ref Range Status   MRSA by PCR POSITIVE (A) NEGATIVE Final    Comment:        The GeneXpert MRSA Assay (FDA approved for NASAL specimens only), is one component of a comprehensive MRSA colonization surveillance program. It is not intended to diagnose MRSA infection nor to guide or monitor treatment for MRSA infections. RESULT CALLED TO, READ BACK BY AND VERIFIED WITH: GOOD,J RN 2.19.18 @2311  ZANDO,C      Liver Function Tests:  Recent Labs Lab 08/13/16 1520 08/15/16 0530  AST 29 27  ALT 24 22  ALKPHOS 75 68  BILITOT 0.8 0.7  PROT 6.7 5.8*  ALBUMIN 3.4* 3.0*    Recent Labs Lab 08/13/16 1520  LIPASE 35   No results for input(s): AMMONIA in the last 168 hours.  Cardiac Enzymes: No results for input(s): CKTOTAL, CKMB, CKMBINDEX, TROPONINI in the last 168 hours. BNP (last 3 results) No results for input(s): BNP in the last 8760 hours.  ProBNP (last 3 results) No results for input(s): PROBNP in the last 8760 hours.    Studies: Dg Chest 2 View  Result Date: 08/13/2016 CLINICAL DATA:  Abdominal pain.  Chest are  clear EXAM: CHEST  2 VIEW COMPARISON:  Radiograph 03/31/2014 FINDINGS: Patient's chin overlies the RIGHT upper lobe. Aorta is ectatic. The heart is enlarged. There is bibasilar atelectasis. No focal consolidation or pleural fluid. No pneumothorax. IMPRESSION: 1. Cardiomegaly and atelectasis. 2. Chest poorly evaluated secondary to patient positioning and low lung volumes. Electronically Signed   By: Suzy Bouchard M.D.   On: 08/13/2016 17:10   Dg Abd 1 View  Result Date: 08/15/2016 CLINICAL DATA:  Sigmoid volvulus. EXAM: ABDOMEN - 1 VIEW COMPARISON:  CT abdomen and pelvis 08/13/2016. Plain films 08/14/2016. FINDINGS: Moderate gaseous distention in the mid abdomen representing sigmoid colon appears slightly improved. There  is less verticality of the sigmoid colon although it remains disproportionally distended relative to the other colonic loops. Continued surveillance is warranted. IMPRESSION: Slight improvement. Electronically Signed   By: Staci Righter M.D.   On: 08/15/2016 07:58   Dg Abd 1 View  Result Date: 08/14/2016 CLINICAL DATA:  Abdominal pain for several days, moderate abdominal distention EXAM: ABDOMEN - 1 VIEW COMPARISON:  CT abdomen pelvis of 08/13/2016 FINDINGS: There is a slightly gaseous distended loop of what appears to be sigmoid colon in the mid pelvis. This could represent a developing sigmoid volvulus which the patient apparently has had previously. No other bowel distention is seen. The bones are somewhat osteopenic. IMPRESSION: Some gaseous distention of what appears to be sigmoid colon in the mid pelvis may indicate early development of sigmoid volvulus which the patient has reportedly had previously. Electronically Signed   By: Ivar Drape M.D.   On: 08/14/2016 09:15   Ct Abdomen Pelvis W Contrast  Result Date: 08/13/2016 CLINICAL DATA:  Lower abdominal pain. EXAM: CT ABDOMEN AND PELVIS WITH CONTRAST TECHNIQUE: Multidetector CT imaging of the abdomen and pelvis was performed  using the standard protocol following bolus administration of intravenous contrast. CONTRAST:  80 mL Isovue-300 COMPARISON:  04/10/2016 FINDINGS: Lower chest: Subsegmental atelectasis in the lung bases. No pleural effusion. Right atrial enlargement. Hepatobiliary: No focal liver abnormality is seen. No gallstones, gallbladder wall thickening, or biliary dilatation. Pancreas: Diffuse fatty infiltration as previously seen. Spleen: Similar appearance of approximately 7 cm heterogeneously enhancing mass inferiorly in the spleen which is likely benign since it was also present in 2009 and may represent a hemangioma. Adrenals/Urinary Tract: Unremarkable adrenal glands. No evidence of renal calculi or hydronephrosis. Unchanged hypodense lesions in the left kidney which likely represent cysts and measure up to 2.7 cm in the lower pole. Unremarkable bladder. Stomach/Bowel: The stomach is within normal limits. There is marked gaseous distension of redundant mid to distal sigmoid colon which measures up to 11 cm in diameter. There is a swirled appearance of the mesentery at the distal aspect of the dilated sigmoid loop with abrupt transition to decompressed distal sigmoid and rectum. A moderate amount of stool is present more proximally in the colon. There is no small bowel dilatation. Vascular/Lymphatic: Abdominal aortic atherosclerosis without aneurysm. No enlarged lymph nodes. Reproductive: Unremarkable prostate for age. Other: No intraperitoneal free fluid or free air. No abdominal wall mass or hernia. Musculoskeletal: Thoracolumbar scoliosis with advanced diffuse disc and facet degeneration. IMPRESSION: Recurrent sigmoid volvulus. Critical Value/emergent results were called by telephone at the time of interpretation on 08/13/2016 at 5:09 pm to Dr. Leo Grosser , who verbally acknowledged these results. Electronically Signed   By: Logan Bores M.D.   On: 08/13/2016 17:10    Scheduled Meds: . apixaban  5 mg Oral BID  .  Chlorhexidine Gluconate Cloth  6 each Topical Q0600  . diltiazem  180 mg Oral Daily  . doxazosin  2 mg Oral QHS  . finasteride  5 mg Oral Daily  . pseudoephedrine  60 mg Oral Q12H   Or  . guaiFENesin  600 mg Oral Q12H  . magnesium oxide  400 mg Oral Daily  . mupirocin ointment  1 application Nasal BID  . pantoprazole  40 mg Oral Daily  . polyethylene glycol  17 g Oral Daily      Time spent: 25 min  [facility] Pager [phone]. If 7PM-7AM, please contact night-coverage at [email or web address], Office  [phone]  password [email or web address] 08/15/2016, 4:16 PM  LOS: 2 days

## 2016-08-15 NOTE — NC FL2 (Signed)
[place] LEVEL OF CARE SCREENING TOOL     IDENTIFICATION  Patient Name: Eduardo Deleon Birthdate: Apr 07, 1925 Sex: male Admission Date (Current Location): 08/13/2016  [facility] and [place] Number:  Herbalist and Address:  [facility],  [place] [address] [place]      Provider Number: [id number]  Attending Physician Name and Address:  Oswald Hillock, MD  Relative Name and Phone Number:       Current Level of Care: Hospital Recommended Level of Care: [place] Prior Approval Number:    Date Approved/Denied:   PASRR Number:    Discharge Plan: SNF    Current Diagnoses: Patient Active Problem List   Diagnosis Date Noted  . Abdominal distension   . Generalized abdominal pain   . Pressure injury of skin 04/11/2016  . Hypokalemia   . Volvulus ([place]) 04/10/2016  . Sigmoid volvulus ([place])   . Venous stasis dermatitis of both lower extremities 05/26/2015  . Non-compliant behavior 04/27/2014  . Acute respiratory failure ([place]) 03/29/2014  . Prostate CA ([place]) 10/11/2013  . Elevated brain natriuretic peptide (BNP) level 10/11/2013  . Altered mental status 10/08/2013  . Atrial fibrillation ([place]) 11/21/2012  . Long term current use of anticoagulant therapy 11/21/2012  . Heme positive stool 06/29/2011  . Lower extremity venous stasis 06/28/2011  . Torticollis 06/28/2011  . CRI (chronic renal insufficiency) 06/28/2011  . Anemia 05/20/2011  . GERD (gastroesophageal reflux disease) 05/19/2011  . BPH (benign prostatic hyperplasia) 05/19/2011  . Chronic systolic CHF (congestive heart failure) ([place]) 05/19/2011    Orientation RESPIRATION BLADDER Height & Weight     Self, Time, Situation, Place  Normal Incontinent Weight: 145 lb (65.8 kg) Height:  5\' 6"  (167.6 cm)  BEHAVIORAL SYMPTOMS/MOOD NEUROLOGICAL BOWEL NUTRITION STATUS   (none )  (none ) Continent Diet (clear liquid )  AMBULATORY STATUS COMMUNICATION OF NEEDS Skin    Independent Verbally Normal                       Personal Care Assistance Level of Assistance  Dressing, Bathing, Feeding Bathing Assistance: Limited assistance Feeding assistance: Independent Dressing Assistance: Limited assistance     Functional Limitations Info  Speech, Hearing, Sight Sight Info: Adequate Hearing Info: Adequate Speech Info: Adequate    SPECIAL CARE FACTORS FREQUENCY                       Contractures      Additional Factors Info  Code Status, Allergies Code Status Info: DNR  Allergies Info: N/A           Current Medications (08/15/2016):  This is the current hospital active medication list Current Facility-Administered Medications  Medication Dose Route Frequency Provider Last Rate Last Dose  . acetaminophen (TYLENOL) tablet 650 mg  650 mg Oral Q6H PRN Karmen Bongo, MD       Or  . acetaminophen (TYLENOL) suppository 650 mg  650 mg Rectal Q6H PRN Karmen Bongo, MD      . apixaban Arne Cleveland) tablet 5 mg  5 mg Oral BID Bertram Savin Sheikh, DO   5 mg at 08/14/16 2132  . Chlorhexidine Gluconate Cloth 2 % PADS 6 each  6 each Topical Q0600 Karmen Bongo, MD   6 each at 08/15/16 0600  . diltiazem (CARDIZEM CD) 24 hr capsule 180 mg  180 mg Oral Daily Karmen Bongo, MD   180 mg at 08/14/16 0947  .  doxazosin (CARDURA) tablet 2 mg  2 mg Oral QHS Karmen Bongo, MD   2 mg at 08/14/16 2132  . finasteride (PROSCAR) tablet 5 mg  5 mg Oral Daily Karmen Bongo, MD   5 mg at 08/14/16 0947  . pseudoephedrine (SUDAFED) tablet 60 mg  60 mg Oral Q12H Karmen Bongo, MD       Or  . guaiFENesin [facility]) 12 hr tablet 600 mg  600 mg Oral Q12H Karmen Bongo, MD   600 mg at 08/14/16 2133  . magnesium oxide (MAG-OX) tablet 400 mg  400 mg Oral Daily Karmen Bongo, MD   400 mg at 08/14/16 0946  . mupirocin ointment (BACTROBAN) 2 % 1 application  1 application Nasal BID Karmen Bongo, MD   1 application at [id number]  . ondansetron (ZOFRAN) tablet 4 mg  4 mg  Oral Q6H PRN Karmen Bongo, MD       Or  . ondansetron [facility]) injection 4 mg  4 mg Intravenous Q6H PRN Karmen Bongo, MD      . pantoprazole (PROTONIX) EC tablet 40 mg  40 mg Oral Daily Karmen Bongo, MD   40 mg at 08/14/16 0946  . polyethylene glycol (MIRALAX / GLYCOLAX) packet 17 g  17 g Oral Daily Karmen Bongo, MD   17 g at 08/14/16 [id number]     Discharge Medications: Please see discharge summary for a list of discharge medications.  Relevant Imaging Results:  Relevant Lab Results:   Additional Information SSN [id number]  Glendon Axe A

## 2016-08-15 NOTE — Progress Notes (Addendum)
Nephew completed paperwork at facility. Still waiting for D/C Summary  Update: Patient will not D/C Today Left voicemail for [facility].  No other needs identified at this time.   Kathrin Greathouse, Latanya Presser, MSW Clinical Social Worker 5E and Psychiatric Service Line [phone] 08/15/2016  4:37 PM

## 2016-08-15 NOTE — Clinical Social Work Note (Signed)
Clinical Social Work Assessment  Patient Details  Name: Eduardo Deleon MRN: [id number] Date of Birth: [birth year]  Date of referral:  08/15/16               Reason for consult:  Discharge Planning                Permission sought to share information with:  Family Supports, Customer service manager, Case Optician, dispensing granted to share information::  Yes, Verbal Permission Granted  Name::      Eduardo Deleon)  Agency::   ([facility] )  Relationship::   ([name])  Contact Information:   [phone])  Housing/Transportation Living arrangements for the past 2 months:  [place] of Information:  Patient, Other (Comment Required) Eduardo Deleon) Patient Interpreter Needed:  None Criminal Activity/Legal Involvement Pertinent to Current Situation/Hospitalization:  No - Comment as needed Significant Relationships:  Spouse, Other(Comment) Eduardo Deleon) Lives with:  Facility Resident Do you feel safe going back to the place where you live?  Yes Need for family participation in patient care:  No (Coment)  Care giving concerns:  Patient admitted from [place] as [facility] resident.    Social Worker assessment / plan:  MSW met with patient at bedside in reference to patient's return to SNF. MSW introduced MSW role. Patient confirmed that he and his wife is from [facility] and agreeable to return once stable for dc. MSW contacted patient's Eduardo Deleon who will complete re-admissions paperwork for patient's return. Patient's godson also reported that he plans to visit patient at facility over the weekend, patient aware. No further concerns reported at this time. MSW remains available as needed.   Employment status:  Retired Forensic scientist:  Medicare PT Recommendations:  Not assessed at this time Information / Referral to community resources:   (return to SNF)  Patient/Family's Response to care: Patient a/o x4, HOH. Patient and  godson agreeable to pt's return to SNF. Pt's godson supportive and will complete re-admission paperwork. Patient and godson appreciated social work intervention.   Patient/Family's Understanding of and Emotional Response to Diagnosis, Current Treatment, and Prognosis:  Patient's godson knowledgeable of medical interventions.   Emotional Assessment Appearance:  Appears stated age Attitude/Demeanor/Rapport:   (Polite ) Affect (typically observed):  Accepting, Appropriate, Pleasant, Happy Orientation:  Oriented to Situation, Oriented to  Time, Oriented to Place, Oriented to Self Alcohol / Substance use:  Not Applicable Psych involvement (Current and /or in the community):  No (Comment)  Discharge Needs  Concerns to be addressed:  Denies Needs/Concerns at this time Readmission within the last 30 days:  No Current discharge risk:  None Barriers to Discharge:  No Barriers Identified   Eduardo Deleon, MSW [phone] 08/15/2016 11:16 AM

## 2016-08-15 NOTE — Progress Notes (Signed)
2 Days Post-Op  Subjective: Tolerating clear liquids.  + BM, still distended but not tender.  Objective: Vital signs in last 24 hours: Temp:  [97.5 F (36.4 C)-98 F (36.7 C)] 98 F (36.7 C) (02/21 0631) Pulse Rate:  [56-72] 72 (02/21 0631) Resp:  [17-18] 18 (02/21 0631) BP: (79-130)/(60-80) 130/65 (02/21 0631) SpO2:  [94 %-100 %] 96 % (02/21 0631) Last BM Date: 08/14/16 960 PO Urine 50 recorded BM x 5 Afebrile VSS Labs are normal  Film:  Moderate gaseous distention in the mid abdomen representing sigmoid colon appears slightly improved. There is less verticality of the sigmoid colon although it remains disproportionally distended relative to the other colonic loops. Continued surveillance is Warranted.  Intake/Output from previous day: 02/20 0701 - 02/21 0700 In: 960 [P.O.:960] Out: 50 [Urine:50] Intake/Output this shift: No intake/output data recorded.  General appearance: alert, cooperative and no distress GI: He remains distended, with active bowel sounds. He is not tender, tolerating clears and having bowel movements.  Lab Results:   Recent Labs  08/14/16 0438 08/15/16 0530  WBC 5.5 4.8  HGB 10.5* 10.6*  HCT 32.1* 33.2*  PLT 222 216    BMET  Recent Labs  08/14/16 0438 08/15/16 0530  NA 141 141  K 3.7 3.8  CL 108 109  CO2 28 27  GLUCOSE 92 84  BUN 20 14  CREATININE 0.88 0.84  CALCIUM 8.7* 8.7*   PT/INR No results for input(s): LABPROT, INR in the last 72 hours.   Recent Labs Lab 08/13/16 1520 08/15/16 0530  AST 29 27  ALT 24 22  ALKPHOS 75 68  BILITOT 0.8 0.7  PROT 6.7 5.8*  ALBUMIN 3.4* 3.0*     Lipase     Component Value Date/Time   LIPASE 35 08/13/2016 1520     Studies/Results: Dg Chest 2 View  Result Date: 08/13/2016 CLINICAL DATA:  Abdominal pain.  Chest are clear EXAM: CHEST  2 VIEW COMPARISON:  Radiograph 03/31/2014 FINDINGS: Patient's chin overlies the RIGHT upper lobe. Aorta is ectatic. The heart is enlarged. There  is bibasilar atelectasis. No focal consolidation or pleural fluid. No pneumothorax. IMPRESSION: 1. Cardiomegaly and atelectasis. 2. Chest poorly evaluated secondary to patient positioning and low lung volumes. Electronically Signed   By: Suzy Bouchard M.D.   On: 08/13/2016 17:10   Dg Abd 1 View  Result Date: 08/15/2016 CLINICAL DATA:  Sigmoid volvulus. EXAM: ABDOMEN - 1 VIEW COMPARISON:  CT abdomen and pelvis 08/13/2016. Plain films 08/14/2016. FINDINGS: Moderate gaseous distention in the mid abdomen representing sigmoid colon appears slightly improved. There is less verticality of the sigmoid colon although it remains disproportionally distended relative to the other colonic loops. Continued surveillance is warranted. IMPRESSION: Slight improvement. Electronically Signed   By: Staci Righter M.D.   On: 08/15/2016 07:58   Dg Abd 1 View  Result Date: 08/14/2016 CLINICAL DATA:  Abdominal pain for several days, moderate abdominal distention EXAM: ABDOMEN - 1 VIEW COMPARISON:  CT abdomen pelvis of 08/13/2016 FINDINGS: There is a slightly gaseous distended loop of what appears to be sigmoid colon in the mid pelvis. This could represent a developing sigmoid volvulus which the patient apparently has had previously. No other bowel distention is seen. The bones are somewhat osteopenic. IMPRESSION: Some gaseous distention of what appears to be sigmoid colon in the mid pelvis may indicate early development of sigmoid volvulus which the patient has reportedly had previously. Electronically Signed   By: Ivar Drape M.D.   On:  08/14/2016 09:15   Ct Abdomen Pelvis W Contrast  Result Date: 08/13/2016 CLINICAL DATA:  Lower abdominal pain. EXAM: CT ABDOMEN AND PELVIS WITH CONTRAST TECHNIQUE: Multidetector CT imaging of the abdomen and pelvis was performed using the standard protocol following bolus administration of intravenous contrast. CONTRAST:  80 mL Isovue-300 COMPARISON:  04/10/2016 FINDINGS: Lower chest:  Subsegmental atelectasis in the lung bases. No pleural effusion. Right atrial enlargement. Hepatobiliary: No focal liver abnormality is seen. No gallstones, gallbladder wall thickening, or biliary dilatation. Pancreas: Diffuse fatty infiltration as previously seen. Spleen: Similar appearance of approximately 7 cm heterogeneously enhancing mass inferiorly in the spleen which is likely benign since it was also present in 2009 and may represent a hemangioma. Adrenals/Urinary Tract: Unremarkable adrenal glands. No evidence of renal calculi or hydronephrosis. Unchanged hypodense lesions in the left kidney which likely represent cysts and measure up to 2.7 cm in the lower pole. Unremarkable bladder. Stomach/Bowel: The stomach is within normal limits. There is marked gaseous distension of redundant mid to distal sigmoid colon which measures up to 11 cm in diameter. There is a swirled appearance of the mesentery at the distal aspect of the dilated sigmoid loop with abrupt transition to decompressed distal sigmoid and rectum. A moderate amount of stool is present more proximally in the colon. There is no small bowel dilatation. Vascular/Lymphatic: Abdominal aortic atherosclerosis without aneurysm. No enlarged lymph nodes. Reproductive: Unremarkable prostate for age. Other: No intraperitoneal free fluid or free air. No abdominal wall mass or hernia. Musculoskeletal: Thoracolumbar scoliosis with advanced diffuse disc and facet degeneration. IMPRESSION: Recurrent sigmoid volvulus. Critical Value/emergent results were called by telephone at the time of interpretation on 08/13/2016 at 5:09 pm to Dr. Leo Grosser , who verbally acknowledged these results. Electronically Signed   By: Logan Bores M.D.   On: 08/13/2016 17:10    Medications: . apixaban  5 mg Oral BID  . Chlorhexidine Gluconate Cloth  6 each Topical Q0600  . diltiazem  180 mg Oral Daily  . doxazosin  2 mg Oral QHS  . finasteride  5 mg Oral Daily  .  pseudoephedrine  60 mg Oral Q12H   Or  . guaiFENesin  600 mg Oral Q12H  . magnesium oxide  400 mg Oral Daily  . mupirocin ointment  1 application Nasal BID  . pantoprazole  40 mg Oral Daily  . polyethylene glycol  17 g Oral Daily     Assessment/Plan Recurrent sigmoid volvulus Atrial fibrillation on Eliquis/last dose 08/13/16 History of congestive heart failure History of chronic renal insufficiency Severe torticollis History of prostate cancer History of CVA Osteoarthritis GERD FEN: Clears ID:  No antibiotics DVT:  Apixaban   Plan: Medical management.    LOS: 2 days    Eduardo Deleon,Eduardo Deleon 08/15/2016 [phone]  Agree with above. No acute surgical issues. He expects to return to [facility].  Using a stethoscope helps talking to him ... Put the stethoscope in his ears.  [name], MD, [facility] Pager: [phone] Office phone:  [phone]

## 2016-08-15 NOTE — Clinical Social Work Note (Signed)
[place] MUST [facility]ARR obtained: [id number] Rozell Searing, MSW [phone] 08/15/2016 10:27 AM

## 2016-08-16 DIAGNOSIS — R140 Abdominal distension (gaseous): Secondary | ICD-10-CM

## 2016-08-16 LAB — COMPREHENSIVE METABOLIC PANEL
ALT: 22 U/L (ref 17–63)
ANION GAP: 6 (ref 5–15)
AST: 25 U/L (ref 15–41)
Albumin: 3.4 g/dL — ABNORMAL LOW (ref 3.5–5.0)
Alkaline Phosphatase: 77 U/L (ref 38–126)
BUN: 11 mg/dL (ref 6–20)
CALCIUM: 9 mg/dL (ref 8.9–10.3)
CHLORIDE: 108 mmol/L (ref 101–111)
CO2: 28 mmol/L (ref 22–32)
CREATININE: 0.9 mg/dL (ref 0.61–1.24)
Glucose, Bld: 93 mg/dL (ref 65–99)
Potassium: 3.9 mmol/L (ref 3.5–5.1)
Sodium: 142 mmol/L (ref 135–145)
Total Bilirubin: 0.9 mg/dL (ref 0.3–1.2)
Total Protein: 6.3 g/dL — ABNORMAL LOW (ref 6.5–8.1)

## 2016-08-16 LAB — CBC
HCT: 36.3 % — ABNORMAL LOW (ref 39.0–52.0)
Hemoglobin: 11.5 g/dL — ABNORMAL LOW (ref 13.0–17.0)
MCH: 28.1 pg (ref 26.0–34.0)
MCHC: 31.7 g/dL (ref 30.0–36.0)
MCV: 88.8 fL (ref 78.0–100.0)
PLATELETS: 205 10*3/uL (ref 150–400)
RBC: 4.09 MIL/uL — AB (ref 4.22–5.81)
RDW: 15.9 % — ABNORMAL HIGH (ref 11.5–15.5)
WBC: 4.1 10*3/uL (ref 4.0–10.5)

## 2016-08-16 NOTE — Clinical Social Work Note (Signed)
Medical Social Worker facilitated patient discharge including contacting patient family and facility to confirm patient discharge plans.  Clinical information faxed to facility and family agreeable with plan.  MSW arranged ambulance transport via [place] to [facility]  RN to call report prior to discharge [phone].  Medical Social Worker will sign off for now as social work intervention is no longer needed. Please consult [place] again if new need arises.  Glendon Axe, MSW [phone] 08/16/2016 11:10 AM

## 2016-08-16 NOTE — Progress Notes (Signed)
Pt discharged via [facility] to [facility] and report called in to [facility], the nurse. Pt going to room 707A is good condition.

## 2016-08-16 NOTE — Discharge Summary (Addendum)
Physician Discharge Summary  Eduardo Deleon [id number] DOB: 1925-02-01 DOA: 08/13/2016  PCP: Gwendolyn Grant, MD  Admit date: 08/13/2016 Discharge date: 08/16/2016  Time spent: 25* minutes  Recommendations for Outpatient Follow-up:  1. Patient to be discharged to SNF today  Discharge Diagnoses:  Principal Problem:   Sigmoid volvulus ([place]) Active Problems:   BPH (benign prostatic hyperplasia)   Anemia   Torticollis   CRI (chronic renal insufficiency)   Atrial fibrillation (HCC)   Long term current use of anticoagulant therapy   Abdominal distension   Discharge Condition: Stable  Diet recommendation: Regular diet  Filed Weights   08/13/16 1517  Weight: 65.8 kg (145 lb)    History of present illness:  83 y.o.malewith medical history significant of A. fib on Eliquis, CHF, CRI, history of prostate cancer, severe torticollis, colonic diverticulosis and recurrent sigmoid volvulus presenting with acute onset of abdominal pain this AM. The pain was diffuse in his abdomen but by the time I saw him he was s/p sigmoidoscopy and decompression and was no longer having pain. He did not have n/v with this episode. Additional history was not able to be obtained due to patient's severe hearing loss as well as his limited historian abilities.GI was called and came to the bedside for flex sig sigmoid decompression which was successful. GI had nothing more to offer thinks he would need definitive surgical management so General Surgery was consulted. They feel like he is a poor surgical candidate and recommend continued medical management and sigmoidoscopy decompressions.    Hospital Course:  1. Recurrent sigmoid volvulus- patient has history of recurrent sigmoid volvulus, last in October. Complete decompression was obtained on flexible sigmoidoscopy but partial torsion remained. Surgery was consulted during previous admission for volvulus at that time he declined surgery.. General surgery  was consulted and recommended medical management with sigmoidoscopy decompressions as needed.patient is now started back on regular diet 2. Atrial fibrillation- CHA2DS2VASc score is 5. Heart rate is controlled, continue Cardizem 180 mg by mouth daily. Continue eliquis. 3. Asymptomatic bacteriuria- patient had abnormal UA with positive nitrite, when he came to ED on 08/13/2016, but he was asymptomatic, with normal WBC. Repeat UA again showed positive nitrite, I called and discussed with ID,Dr Megan Salon, patient has asymptomatic bacteriuria, will not start treatment even if he has positive culture.Treat only when patient becomes symptomatic. 4. History of CVA with severe torticollis- stable. 5. BPH- continue doxazosin 2 mg by mouth daily at bedtime and finasteride 5 mg by mouth daily 6. Chronic diastolic CHF- stable, continue lasix 20 mg po daily.   Procedures:  Flexible sigmoidoscopy  Consultations:  Gastroenterology  General surgery  Discharge Exam: Vitals:   08/16/16 0800 08/16/16 1016  BP: 126/90 114/78  Pulse: 82 79  Resp: 20   Temp: 98.1 F (36.7 C)     General: Appears in no acute distress Cardiovascular: RRR, S1S2 Respiratory: Clear bilaterally  Discharge Instructions   Discharge Instructions    Diet - low sodium heart healthy    Complete by:  As directed    Increase activity slowly    Complete by:  As directed      Current Discharge Medication List    CONTINUE these medications which have NOT CHANGED   Details  acetaminophen (TYLENOL) 325 MG tablet Take 650 mg by mouth every 6 (six) hours as needed for moderate pain.    apixaban (ELIQUIS) 5 MG TABS tablet Take 1 tablet (5 mg total) by mouth 2 (two) times daily.  cholecalciferol (VITAMIN D) 1000 UNITS tablet Take 1,000 Units by mouth daily.     diltiazem (CARDIZEM CD) 180 MG 24 hr capsule Take 1 capsule (180 mg total) by mouth daily. Qty: 30 capsule, Refills: 11    doxazosin (CARDURA) 2 MG tablet Take 2  mg by mouth at bedtime.      finasteride (PROSCAR) 5 MG tablet Take 5 mg by mouth daily.    furosemide (LASIX) 20 MG tablet Take 20 mg by mouth daily.    iron polysaccharides (FERREX 150) 150 MG capsule Take 1 capsule (150 mg total) by mouth daily. Qty: 30 capsule, Refills: 5    magnesium oxide (MAG-OX) 400 MG tablet Take 400 mg by mouth daily.    Multiple Vitamins-Minerals (PRESERVISION AREDS 2 PO) Take 1 capsule by mouth 2 (two) times daily.    pantoprazole (PROTONIX) 40 MG tablet Take 1 tablet (40 mg total) by mouth daily.    polyethylene glycol (MIRALAX / GLYCOLAX) packet Take 17 g by mouth daily.    potassium chloride (KLOR-CON) 10 MEQ CR tablet Take 10 mEq by mouth daily.       STOP taking these medications     azithromycin (ZITHROMAX) 250 MG tablet      pseudoephedrine-guaifenesin (MUCINEX D) 60-600 MG 12 hr tablet        No Known Allergies    The results of significant diagnostics from this hospitalization (including imaging, microbiology, ancillary and laboratory) are listed below for reference.    Significant Diagnostic Studies: Dg Chest 2 View  Result Date: 08/13/2016 CLINICAL DATA:  Abdominal pain.  Chest are clear EXAM: CHEST  2 VIEW COMPARISON:  Radiograph 03/31/2014 FINDINGS: Patient's chin overlies the RIGHT upper lobe. Aorta is ectatic. The heart is enlarged. There is bibasilar atelectasis. No focal consolidation or pleural fluid. No pneumothorax. IMPRESSION: 1. Cardiomegaly and atelectasis. 2. Chest poorly evaluated secondary to patient positioning and low lung volumes. Electronically Signed   By: Suzy Bouchard M.D.   On: 08/13/2016 17:10   Dg Abd 1 View  Result Date: 08/15/2016 CLINICAL DATA:  Sigmoid volvulus. EXAM: ABDOMEN - 1 VIEW COMPARISON:  CT abdomen and pelvis 08/13/2016. Plain films 08/14/2016. FINDINGS: Moderate gaseous distention in the mid abdomen representing sigmoid colon appears slightly improved. There is less verticality of the sigmoid  colon although it remains disproportionally distended relative to the other colonic loops. Continued surveillance is warranted. IMPRESSION: Slight improvement. Electronically Signed   By: Staci Righter M.D.   On: 08/15/2016 07:58   Dg Abd 1 View  Result Date: 08/14/2016 CLINICAL DATA:  Abdominal pain for several days, moderate abdominal distention EXAM: ABDOMEN - 1 VIEW COMPARISON:  CT abdomen pelvis of 08/13/2016 FINDINGS: There is a slightly gaseous distended loop of what appears to be sigmoid colon in the mid pelvis. This could represent a developing sigmoid volvulus which the patient apparently has had previously. No other bowel distention is seen. The bones are somewhat osteopenic. IMPRESSION: Some gaseous distention of what appears to be sigmoid colon in the mid pelvis may indicate early development of sigmoid volvulus which the patient has reportedly had previously. Electronically Signed   By: Ivar Drape M.D.   On: 08/14/2016 09:15   Ct Abdomen Pelvis W Contrast  Result Date: 08/13/2016 CLINICAL DATA:  Lower abdominal pain. EXAM: CT ABDOMEN AND PELVIS WITH CONTRAST TECHNIQUE: Multidetector CT imaging of the abdomen and pelvis was performed using the standard protocol following bolus administration of intravenous contrast. CONTRAST:  80 mL Isovue-300 COMPARISON:  04/10/2016 FINDINGS: Lower chest: Subsegmental atelectasis in the lung bases. No pleural effusion. Right atrial enlargement. Hepatobiliary: No focal liver abnormality is seen. No gallstones, gallbladder wall thickening, or biliary dilatation. Pancreas: Diffuse fatty infiltration as previously seen. Spleen: Similar appearance of approximately 7 cm heterogeneously enhancing mass inferiorly in the spleen which is likely benign since it was also present in 2009 and may represent a hemangioma. Adrenals/Urinary Tract: Unremarkable adrenal glands. No evidence of renal calculi or hydronephrosis. Unchanged hypodense lesions in the left kidney which  likely represent cysts and measure up to 2.7 cm in the lower pole. Unremarkable bladder. Stomach/Bowel: The stomach is within normal limits. There is marked gaseous distension of redundant mid to distal sigmoid colon which measures up to 11 cm in diameter. There is a swirled appearance of the mesentery at the distal aspect of the dilated sigmoid loop with abrupt transition to decompressed distal sigmoid and rectum. A moderate amount of stool is present more proximally in the colon. There is no small bowel dilatation. Vascular/Lymphatic: Abdominal aortic atherosclerosis without aneurysm. No enlarged lymph nodes. Reproductive: Unremarkable prostate for age. Other: No intraperitoneal free fluid or free air. No abdominal wall mass or hernia. Musculoskeletal: Thoracolumbar scoliosis with advanced diffuse disc and facet degeneration. IMPRESSION: Recurrent sigmoid volvulus. Critical Value/emergent results were called by telephone at the time of interpretation on 08/13/2016 at 5:09 pm to Dr. Leo Grosser , who verbally acknowledged these results. Electronically Signed   By: Logan Bores M.D.   On: 08/13/2016 17:10    Microbiology: Recent Results (from the past 240 hour(s))  MRSA PCR Screening     Status: Abnormal   Collection Time: 08/13/16  8:30 PM  Result Value Ref Range Status   MRSA by PCR POSITIVE (A) NEGATIVE Final    Comment:        The GeneXpert MRSA Assay (FDA approved for NASAL specimens only), is one component of a comprehensive MRSA colonization surveillance program. It is not intended to diagnose MRSA infection nor to guide or monitor treatment for MRSA infections. RESULT CALLED TO, READ BACK BY AND VERIFIED WITH: GOOD,J RN 2.19.18 @2311  ZANDO,C      Labs: Basic Metabolic Panel:  Recent Labs Lab 08/13/16 1520 08/14/16 0438 08/15/16 0530 08/16/16 0445  NA 140 141 141 142  K 4.5 3.7 3.8 3.9  CL 104 108 109 108  CO2 28 28 27 28   GLUCOSE 132* 92 84 93  BUN 25* 20 14 11    CREATININE 1.21 0.88 0.84 0.90  CALCIUM 9.2 8.7* 8.7* 9.0  MG  --   --  2.0  --   PHOS  --   --  3.4  --    Liver Function Tests:  Recent Labs Lab 08/13/16 1520 08/15/16 0530 08/16/16 0445  AST 29 27 25   ALT 24 22 22   ALKPHOS 75 68 77  BILITOT 0.8 0.7 0.9  PROT 6.7 5.8* 6.3*  ALBUMIN 3.4* 3.0* 3.4*    Recent Labs Lab 08/13/16 1520  LIPASE 35   No results for input(s): AMMONIA in the last 168 hours. CBC:  Recent Labs Lab 08/13/16 1520 08/14/16 0438 08/15/16 0530 08/16/16 0445  WBC 9.7 5.5 4.8 4.1  NEUTROABS 8.6*  --  3.1  --   HGB 11.8* 10.5* 10.6* 11.5*  HCT 36.6* 32.1* 33.2* 36.3*  MCV 87.1 88.7 86.9 88.8  PLT 247 222 216 205       Signed:  Shauntell Iglesia S MD.  [facility] 08/16/2016, 10:41 AM

## 2016-08-16 NOTE — Progress Notes (Signed)
3 Days Post-Op  Subjective: His diet was not advanced. He's tolerating liquids, his abdomen is less distended and he wants to go back to [facility].  Objective: Vital signs in last 24 hours: Temp:  [97.7 F (36.5 C)-98.6 F (37 C)] 97.7 F (36.5 C) (02/22 0640) Pulse Rate:  [61-80] 61 (02/22 0640) Resp:  [17-18] 18 (02/22 0640) BP: (113-128)/(68-87) 128/85 (02/22 0640) SpO2:  [94 %-98 %] 94 % (02/22 0640) Last BM Date: 08/15/16 840 Po BM x 5 Afebrile, VSS Labs OK Intake/Output from previous day: 02/21 0701 - 02/22 0700 In: 840 [P.O.:840] Out: 150 [Urine:150] Intake/Output this shift: No intake/output data recorded.  General appearance: alert, cooperative, no distress and His hearing remains a major issue. GI: Soft, less distended no tenderness tolerating by mouth's and having bowel movements without difficulty.  Lab Results:   Recent Labs  08/15/16 0530 08/16/16 0445  WBC 4.8 4.1  HGB 10.6* 11.5*  HCT 33.2* 36.3*  PLT 216 205    BMET  Recent Labs  08/15/16 0530 08/16/16 0445  NA 141 142  K 3.8 3.9  CL 109 108  CO2 27 28  GLUCOSE 84 93  BUN 14 11  CREATININE 0.84 0.90  CALCIUM 8.7* 9.0   PT/INR No results for input(s): LABPROT, INR in the last 72 hours.   Recent Labs Lab 08/13/16 1520 08/15/16 0530 08/16/16 0445  AST 29 27 25   ALT 24 22 22   ALKPHOS 75 68 77  BILITOT 0.8 0.7 0.9  PROT 6.7 5.8* 6.3*  ALBUMIN 3.4* 3.0* 3.4*     Lipase     Component Value Date/Time   LIPASE 35 08/13/2016 1520     Studies/Results: Dg Abd 1 View  Result Date: 08/15/2016 CLINICAL DATA:  Sigmoid volvulus. EXAM: ABDOMEN - 1 VIEW COMPARISON:  CT abdomen and pelvis 08/13/2016. Plain films 08/14/2016. FINDINGS: Moderate gaseous distention in the mid abdomen representing sigmoid colon appears slightly improved. There is less verticality of the sigmoid colon although it remains disproportionally distended relative to the other colonic loops. Continued surveillance  is warranted. IMPRESSION: Slight improvement. Electronically Signed   By: Staci Righter M.D.   On: 08/15/2016 07:58   Dg Abd 1 View  Result Date: 08/14/2016 CLINICAL DATA:  Abdominal pain for several days, moderate abdominal distention EXAM: ABDOMEN - 1 VIEW COMPARISON:  CT abdomen pelvis of 08/13/2016 FINDINGS: There is a slightly gaseous distended loop of what appears to be sigmoid colon in the mid pelvis. This could represent a developing sigmoid volvulus which the patient apparently has had previously. No other bowel distention is seen. The bones are somewhat osteopenic. IMPRESSION: Some gaseous distention of what appears to be sigmoid colon in the mid pelvis may indicate early development of sigmoid volvulus which the patient has reportedly had previously. Electronically Signed   By: Ivar Drape M.D.   On: 08/14/2016 09:15    Medications: . apixaban  5 mg Oral BID  . Chlorhexidine Gluconate Cloth  6 each Topical Q0600  . diltiazem  180 mg Oral Daily  . doxazosin  2 mg Oral QHS  . finasteride  5 mg Oral Daily  . pseudoephedrine  60 mg Oral Q12H   Or  . guaiFENesin  600 mg Oral Q12H  . magnesium oxide  400 mg Oral Daily  . mupirocin ointment  1 application Nasal BID  . pantoprazole  40 mg Oral Daily  . polyethylene glycol  17 g Oral Daily    Assessment/Plan Recurrent sigmoid volvulus Atrial  fibrillation on Eliquis/last dose 08/13/16 History of congestive heart failure History of chronic renal insufficiency Severe torticollis History of prostate cancer History of CVA Osteoarthritis GERD FEN: Clears  ID:  No antibiotics DVT:  Apixaban     Plan: I'll put him on a soft diet and from our standpoint he can return to skilled nursing facility. He is not a candidate for surgical intervention.   If he has recurrence of his volvulus Dr. Lucia Gaskins recommends he undergo decompression as needed.   LOS: 3 days    JENNINGS,WILLARD 08/16/2016 [phone]  Agree with above. To leave to  [facility] today.  [name], MD, [facility] Pager: [phone] Office phone:  [phone]

## 2016-08-18 LAB — URINE CULTURE: Culture: >=100000 — AB

## 2016-08-28 ENCOUNTER — Other Ambulatory Visit: Payer: Self-pay | Admitting: *Deleted

## 2016-08-28 NOTE — Patient Outreach (Signed)
[facility]) Care Management  08/28/2016  ZAYDON KINSER [birth year] [id number]   [facility]) Care Management Post-Acute Care Coordination  08/28/2016  VALDEMAR MCCLENAHAN 1924/08/05 [id number]   Met with Lowella Fairy, SW for [facility]. Reviewed patient case. She confirms that patient is a [place] resident of facility and there are no plans to discharge at this time.  RNCM will sign off case.  Royetta Crochet. Laymond Purser, RN, BSN, [place] Post-Acute Care Coordinator [phone]

## 2016-09-09 ENCOUNTER — Emergency Department (HOSPITAL_COMMUNITY): Payer: Medicare Other

## 2016-09-09 ENCOUNTER — Encounter (HOSPITAL_COMMUNITY)
Admission: EM | Disposition: A | Discharge status: Home / Self Care | Payer: Self-pay | Source: Home / Self Care | Attending: Pulmonary Disease

## 2016-09-09 ENCOUNTER — Inpatient Hospital Stay (HOSPITAL_COMMUNITY)
Admission: EM | Admit: 2016-09-09 | Discharge: 2016-09-21 | DRG: 329 | Disposition: A | Discharge status: Home / Self Care | Payer: Medicare Other | Attending: Internal Medicine | Admitting: Internal Medicine

## 2016-09-09 ENCOUNTER — Encounter (HOSPITAL_COMMUNITY): Payer: Self-pay | Admitting: *Deleted

## 2016-09-09 DIAGNOSIS — R410 Disorientation, unspecified: Secondary | ICD-10-CM | POA: Diagnosis present

## 2016-09-09 DIAGNOSIS — E878 Other disorders of electrolyte and fluid balance, not elsewhere classified: Secondary | ICD-10-CM | POA: Diagnosis present

## 2016-09-09 DIAGNOSIS — K219 Gastro-esophageal reflux disease without esophagitis: Secondary | ICD-10-CM | POA: Diagnosis present

## 2016-09-09 DIAGNOSIS — Z803 Family history of malignant neoplasm of breast: Secondary | ICD-10-CM

## 2016-09-09 DIAGNOSIS — Z7901 Long term (current) use of anticoagulants: Secondary | ICD-10-CM | POA: Diagnosis not present

## 2016-09-09 DIAGNOSIS — D509 Iron deficiency anemia, unspecified: Secondary | ICD-10-CM | POA: Diagnosis present

## 2016-09-09 DIAGNOSIS — R935 Abnormal findings on diagnostic imaging of other abdominal regions, including retroperitoneum: Secondary | ICD-10-CM | POA: Diagnosis not present

## 2016-09-09 DIAGNOSIS — E872 Acidosis: Secondary | ICD-10-CM | POA: Diagnosis not present

## 2016-09-09 DIAGNOSIS — N181 Chronic kidney disease, stage 1: Secondary | ICD-10-CM | POA: Diagnosis present

## 2016-09-09 DIAGNOSIS — K562 Volvulus: Secondary | ICD-10-CM | POA: Diagnosis present

## 2016-09-09 DIAGNOSIS — Z79899 Other long term (current) drug therapy: Secondary | ICD-10-CM

## 2016-09-09 DIAGNOSIS — Z515 Encounter for palliative care: Secondary | ICD-10-CM | POA: Diagnosis not present

## 2016-09-09 DIAGNOSIS — F0390 Unspecified dementia without behavioral disturbance: Secondary | ICD-10-CM | POA: Diagnosis present

## 2016-09-09 DIAGNOSIS — J9811 Atelectasis: Secondary | ICD-10-CM | POA: Diagnosis present

## 2016-09-09 DIAGNOSIS — Z9049 Acquired absence of other specified parts of digestive tract: Secondary | ICD-10-CM

## 2016-09-09 DIAGNOSIS — C61 Malignant neoplasm of prostate: Secondary | ICD-10-CM | POA: Diagnosis present

## 2016-09-09 DIAGNOSIS — Z9989 Dependence on other enabling machines and devices: Secondary | ICD-10-CM

## 2016-09-09 DIAGNOSIS — E873 Alkalosis: Secondary | ICD-10-CM | POA: Diagnosis not present

## 2016-09-09 DIAGNOSIS — R059 Cough, unspecified: Secondary | ICD-10-CM

## 2016-09-09 DIAGNOSIS — F0280 Dementia in other diseases classified elsewhere without behavioral disturbance: Secondary | ICD-10-CM | POA: Diagnosis not present

## 2016-09-09 DIAGNOSIS — Z66 Do not resuscitate: Secondary | ICD-10-CM | POA: Diagnosis not present

## 2016-09-09 DIAGNOSIS — Z8249 Family history of ischemic heart disease and other diseases of the circulatory system: Secondary | ICD-10-CM

## 2016-09-09 DIAGNOSIS — I495 Sick sinus syndrome: Secondary | ICD-10-CM | POA: Diagnosis present

## 2016-09-09 DIAGNOSIS — D649 Anemia, unspecified: Secondary | ICD-10-CM | POA: Diagnosis present

## 2016-09-09 DIAGNOSIS — Z833 Family history of diabetes mellitus: Secondary | ICD-10-CM

## 2016-09-09 DIAGNOSIS — R109 Unspecified abdominal pain: Secondary | ICD-10-CM

## 2016-09-09 DIAGNOSIS — J9600 Acute respiratory failure, unspecified whether with hypoxia or hypercapnia: Secondary | ICD-10-CM | POA: Diagnosis not present

## 2016-09-09 DIAGNOSIS — R197 Diarrhea, unspecified: Secondary | ICD-10-CM | POA: Diagnosis not present

## 2016-09-09 DIAGNOSIS — D638 Anemia in other chronic diseases classified elsewhere: Secondary | ICD-10-CM | POA: Diagnosis present

## 2016-09-09 DIAGNOSIS — R6889 Other general symptoms and signs: Secondary | ICD-10-CM

## 2016-09-09 DIAGNOSIS — I482 Chronic atrial fibrillation: Secondary | ICD-10-CM | POA: Diagnosis present

## 2016-09-09 DIAGNOSIS — I4891 Unspecified atrial fibrillation: Secondary | ICD-10-CM | POA: Diagnosis not present

## 2016-09-09 DIAGNOSIS — J9601 Acute respiratory failure with hypoxia: Secondary | ICD-10-CM | POA: Diagnosis not present

## 2016-09-09 DIAGNOSIS — I481 Persistent atrial fibrillation: Secondary | ICD-10-CM | POA: Diagnosis not present

## 2016-09-09 DIAGNOSIS — Z01810 Encounter for preprocedural cardiovascular examination: Secondary | ICD-10-CM

## 2016-09-09 DIAGNOSIS — I739 Peripheral vascular disease, unspecified: Secondary | ICD-10-CM | POA: Diagnosis present

## 2016-09-09 DIAGNOSIS — K439 Ventral hernia without obstruction or gangrene: Secondary | ICD-10-CM | POA: Diagnosis present

## 2016-09-09 DIAGNOSIS — D696 Thrombocytopenia, unspecified: Secondary | ICD-10-CM | POA: Diagnosis present

## 2016-09-09 DIAGNOSIS — Z8673 Personal history of transient ischemic attack (TIA), and cerebral infarction without residual deficits: Secondary | ICD-10-CM

## 2016-09-09 DIAGNOSIS — I5022 Chronic systolic (congestive) heart failure: Secondary | ICD-10-CM | POA: Diagnosis present

## 2016-09-09 DIAGNOSIS — M436 Torticollis: Secondary | ICD-10-CM | POA: Diagnosis present

## 2016-09-09 DIAGNOSIS — Z96653 Presence of artificial knee joint, bilateral: Secondary | ICD-10-CM | POA: Diagnosis present

## 2016-09-09 DIAGNOSIS — Z978 Presence of other specified devices: Secondary | ICD-10-CM

## 2016-09-09 DIAGNOSIS — R1310 Dysphagia, unspecified: Secondary | ICD-10-CM | POA: Diagnosis present

## 2016-09-09 DIAGNOSIS — N400 Enlarged prostate without lower urinary tract symptoms: Secondary | ICD-10-CM | POA: Diagnosis present

## 2016-09-09 DIAGNOSIS — R1030 Lower abdominal pain, unspecified: Secondary | ICD-10-CM | POA: Diagnosis not present

## 2016-09-09 DIAGNOSIS — E46 Unspecified protein-calorie malnutrition: Secondary | ICD-10-CM | POA: Diagnosis present

## 2016-09-09 DIAGNOSIS — Z8719 Personal history of other diseases of the digestive system: Secondary | ICD-10-CM

## 2016-09-09 DIAGNOSIS — E860 Dehydration: Secondary | ICD-10-CM | POA: Diagnosis present

## 2016-09-09 DIAGNOSIS — Z7401 Bed confinement status: Secondary | ICD-10-CM

## 2016-09-09 DIAGNOSIS — K56609 Unspecified intestinal obstruction, unspecified as to partial versus complete obstruction: Secondary | ICD-10-CM

## 2016-09-09 DIAGNOSIS — E876 Hypokalemia: Secondary | ICD-10-CM | POA: Diagnosis not present

## 2016-09-09 DIAGNOSIS — R05 Cough: Secondary | ICD-10-CM

## 2016-09-09 DIAGNOSIS — E870 Hyperosmolality and hypernatremia: Secondary | ICD-10-CM | POA: Diagnosis not present

## 2016-09-09 DIAGNOSIS — R140 Abdominal distension (gaseous): Secondary | ICD-10-CM

## 2016-09-09 DIAGNOSIS — H9190 Unspecified hearing loss, unspecified ear: Secondary | ICD-10-CM | POA: Diagnosis present

## 2016-09-09 DIAGNOSIS — Z6822 Body mass index (BMI) 22.0-22.9, adult: Secondary | ICD-10-CM

## 2016-09-09 DIAGNOSIS — M1990 Unspecified osteoarthritis, unspecified site: Secondary | ICD-10-CM | POA: Diagnosis present

## 2016-09-09 DIAGNOSIS — I5042 Chronic combined systolic (congestive) and diastolic (congestive) heart failure: Secondary | ICD-10-CM | POA: Diagnosis present

## 2016-09-09 DIAGNOSIS — I872 Venous insufficiency (chronic) (peripheral): Secondary | ICD-10-CM | POA: Diagnosis present

## 2016-09-09 DIAGNOSIS — G309 Alzheimer's disease, unspecified: Secondary | ICD-10-CM | POA: Diagnosis not present

## 2016-09-09 DIAGNOSIS — R000 Tachycardia, unspecified: Secondary | ICD-10-CM | POA: Diagnosis not present

## 2016-09-09 DIAGNOSIS — Z7189 Other specified counseling: Secondary | ICD-10-CM | POA: Diagnosis not present

## 2016-09-09 HISTORY — PX: FLEXIBLE SIGMOIDOSCOPY: SHX5431

## 2016-09-09 LAB — CBC
HCT: 34.5 % — ABNORMAL LOW (ref 39.0–52.0)
Hemoglobin: 11.1 g/dL — ABNORMAL LOW (ref 13.0–17.0)
MCH: 28.9 pg (ref 26.0–34.0)
MCHC: 32.2 g/dL (ref 30.0–36.0)
MCV: 89.8 fL (ref 78.0–100.0)
PLATELETS: 140 10*3/uL — AB (ref 150–400)
RBC: 3.84 MIL/uL — AB (ref 4.22–5.81)
RDW: 17.6 % — ABNORMAL HIGH (ref 11.5–15.5)
WBC: 9 10*3/uL (ref 4.0–10.5)

## 2016-09-09 LAB — COMPREHENSIVE METABOLIC PANEL
ALT: 12 U/L — AB (ref 17–63)
AST: 18 U/L (ref 15–41)
Albumin: 3.8 g/dL (ref 3.5–5.0)
Alkaline Phosphatase: 68 U/L (ref 38–126)
Anion gap: 5 (ref 5–15)
BILIRUBIN TOTAL: 1.1 mg/dL (ref 0.3–1.2)
BUN: 24 mg/dL — ABNORMAL HIGH (ref 6–20)
CHLORIDE: 110 mmol/L (ref 101–111)
CO2: 27 mmol/L (ref 22–32)
CREATININE: 0.91 mg/dL (ref 0.61–1.24)
Calcium: 8.9 mg/dL (ref 8.9–10.3)
Glucose, Bld: 126 mg/dL — ABNORMAL HIGH (ref 65–99)
POTASSIUM: 3.8 mmol/L (ref 3.5–5.1)
Sodium: 142 mmol/L (ref 135–145)
TOTAL PROTEIN: 6.7 g/dL (ref 6.5–8.1)

## 2016-09-09 LAB — LIPASE, BLOOD: LIPASE: 20 U/L (ref 11–51)

## 2016-09-09 LAB — I-STAT CG4 LACTIC ACID, ED: Lactic Acid, Venous: 1 mmol/L (ref 0.5–1.9)

## 2016-09-09 LAB — APTT: aPTT: 142 seconds — ABNORMAL HIGH (ref 24–36)

## 2016-09-09 SURGERY — SIGMOIDOSCOPY, FLEXIBLE
Anesthesia: Moderate Sedation

## 2016-09-09 MED ORDER — HEPARIN BOLUS VIA INFUSION
1500.0000 [IU] | Freq: Once | INTRAVENOUS | Status: AC
  Administered 2016-09-09: 1500 [IU] via INTRAVENOUS
  Filled 2016-09-09: qty 1500

## 2016-09-09 MED ORDER — DOXAZOSIN MESYLATE 1 MG PO TABS
2.0000 mg | ORAL_TABLET | Freq: Every day | ORAL | Status: DC
  Administered 2016-09-10 – 2016-09-11 (×2): 2 mg via ORAL
  Filled 2016-09-09 (×2): qty 2
  Filled 2016-09-09 (×2): qty 1

## 2016-09-09 MED ORDER — SODIUM CHLORIDE 0.9 % IV BOLUS (SEPSIS)
1000.0000 mL | Freq: Once | INTRAVENOUS | Status: AC
  Administered 2016-09-09: 1000 mL via INTRAVENOUS

## 2016-09-09 MED ORDER — MUPIROCIN 2 % EX OINT
1.0000 "application " | TOPICAL_OINTMENT | Freq: Two times a day (BID) | CUTANEOUS | Status: AC
  Administered 2016-09-10 – 2016-09-14 (×8): 1 via NASAL
  Filled 2016-09-09 (×6): qty 22

## 2016-09-09 MED ORDER — MIDAZOLAM HCL 5 MG/ML IJ SOLN
INTRAMUSCULAR | Status: AC
  Filled 2016-09-09: qty 2

## 2016-09-09 MED ORDER — FINASTERIDE 5 MG PO TABS
5.0000 mg | ORAL_TABLET | Freq: Every day | ORAL | Status: DC
  Administered 2016-09-11: 5 mg via ORAL
  Filled 2016-09-09: qty 1

## 2016-09-09 MED ORDER — HEPARIN (PORCINE) IN NACL 100-0.45 UNIT/ML-% IJ SOLN
1050.0000 [IU]/h | INTRAMUSCULAR | Status: DC
  Administered 2016-09-09: 1050 [IU]/h via INTRAVENOUS
  Filled 2016-09-09: qty 250

## 2016-09-09 MED ORDER — MORPHINE SULFATE (PF) 4 MG/ML IV SOLN
2.0000 mg | Freq: Once | INTRAVENOUS | Status: AC
  Administered 2016-09-09: 2 mg via INTRAVENOUS
  Filled 2016-09-09: qty 1

## 2016-09-09 MED ORDER — CHLORHEXIDINE GLUCONATE CLOTH 2 % EX PADS
6.0000 | MEDICATED_PAD | Freq: Every day | CUTANEOUS | Status: AC
  Administered 2016-09-10 – 2016-09-14 (×5): 6 via TOPICAL

## 2016-09-09 MED ORDER — SODIUM CHLORIDE 0.9 % IV SOLN
INTRAVENOUS | Status: DC
  Administered 2016-09-09 – 2016-09-10 (×2): via INTRAVENOUS

## 2016-09-09 MED ORDER — IOPAMIDOL (ISOVUE-300) INJECTION 61%
INTRAVENOUS | Status: AC
  Administered 2016-09-09: 100 mL via INTRAVENOUS
  Filled 2016-09-09: qty 100

## 2016-09-09 MED ORDER — SODIUM CHLORIDE 0.9 % IV SOLN: INTRAVENOUS | Status: DC

## 2016-09-09 MED ORDER — SODIUM CHLORIDE 0.9% FLUSH
3.0000 mL | Freq: Two times a day (BID) | INTRAVENOUS | Status: DC
  Administered 2016-09-10 – 2016-09-21 (×16): 3 mL via INTRAVENOUS

## 2016-09-09 MED ORDER — MORPHINE SULFATE (PF) 4 MG/ML IV SOLN
4.0000 mg | Freq: Once | INTRAVENOUS | Status: AC
  Administered 2016-09-09: 4 mg via INTRAVENOUS
  Filled 2016-09-09: qty 1

## 2016-09-09 MED ORDER — DILTIAZEM HCL-DEXTROSE 100-5 MG/100ML-% IV SOLN (PREMIX)
5.0000 mg/h | INTRAVENOUS | Status: DC
  Filled 2016-09-09: qty 100

## 2016-09-09 NOTE — Consult Note (Signed)
Reason for Consult: Sigmoid volvulus Referring Physician: ER  Adine Madura HPI: This is an 83 year old male with multiple medical problems transferred from the nursing home to the ER for complaints of abdominal pain.  The patient was noted to have a recurrent volvulus by CT scan.  The patient complained about severe abdominal pain at the nursing home.  He was deemed by surgery to be a poor surgical candidate.  He underwent sigmoid decompression on 03/2016 and 07/2016.  Past Medical History:  Diagnosis Date  . Anal fissure    Hx of  . Anemia   . Atrial fibrillation (HCC)    chronic anticaog - weintraub  . Congestive heart failure ([place])   . CRI (chronic renal insufficiency)   . Diverticulosis 04/12/1998   Left colon--Flex Sig  . GERD (gastroesophageal reflux disease)   . Internal hemorrhoids   . Osteoarthritis   . Prostate cancer ([place])   . PVD (peripheral vascular disease) ([place])   . 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 Splenic lesion   . Stroke [facility]) 1978 lower brain stem  . Torticollis, acquired   . Tubular adenoma   . Venous stasis dermatitis    hx venous ulcer/cellulitis 06/2011 R and 04/2011 L    Past Surgical History:  Procedure Laterality Date  . COLONOSCOPY     1988  . DIRECT LARYNGOSCOPY N/A 03/31/2014   Procedure: DIRECT LARYNGOSCOPY/EXAM UNDER ANESTHESIA;  Surgeon: [name], MD;  Location: [facility];  Service: ENT;  Laterality: N/A;  . FLEXIBLE SIGMOIDOSCOPY N/A 04/10/2016   Procedure: FLEXIBLE SIGMOIDOSCOPY;  Surgeon: Jerene Bears, MD;  Location: [facility] ENDOSCOPY;  Service: Endoscopy;  Laterality: N/A;  . FLEXIBLE SIGMOIDOSCOPY N/A 08/13/2016   Procedure: FLEXIBLE SIGMOIDOSCOPY;  Surgeon: Doran Stabler, MD;  Location: [facility] ENDOSCOPY;  Service: Gastroenterology;  Laterality: N/A;  . HEMORRHOID SURGERY    . HERNIA REPAIR     left-1982, right - 1985  . JOINT REPLACEMENT  bilaterial knees  . LUMBAR LAMINECTOMY  Per patient  heriated disc in mid spine   . TONSILLECTOMY      Family History  Problem Relation Age of Onset  . Heart disease Father   . Breast cancer Mother   . Diabetes Brother   . Diabetes      grandmother  . Colon cancer Neg Hx     Social History:  reports that he has never smoked. He has never used smokeless tobacco. He reports that he does not drink alcohol or use drugs.  Allergies: No Known Allergies  Medications: Scheduled: Continuous:  Results for orders placed or performed during the hospital encounter of 09/09/16 (from the past 24 hour(s))  Lipase, blood     Status: None   Collection Time: 09/09/16 12:32 PM  Result Value Ref Range   Lipase 20 11 - 51 U/L  Comprehensive metabolic panel     Status: Abnormal   Collection Time: 09/09/16 12:32 PM  Result Value Ref Range   Sodium 142 135 - 145 mmol/L   Potassium 3.8 3.5 - 5.1 mmol/L   Chloride 110 101 - 111 mmol/L   CO2 27 22 - 32 mmol/L   Glucose, Bld 126 (H) 65 - 99 mg/dL   BUN 24 (H) 6 - 20 mg/dL   Creatinine, Ser 0.91 0.61 - 1.24 mg/dL   Calcium 8.9 8.9 - 10.3 mg/dL   Total Protein 6.7 6.5 - 8.1 g/dL   Albumin 3.8 3.5 - 5.0 g/dL  AST 18 15 - 41 U/L   ALT 12 (L) 17 - 63 U/L   Alkaline Phosphatase 68 38 - 126 U/L   Total Bilirubin 1.1 0.3 - 1.2 mg/dL   GFR calc non Af Amer >60 >60 mL/min   GFR calc Af Amer >60 >60 mL/min   Anion gap 5 5 - 15  CBC     Status: Abnormal   Collection Time: 09/09/16 12:32 PM  Result Value Ref Range   WBC 9.0 4.0 - 10.5 K/uL   RBC 3.84 (L) 4.22 - 5.81 MIL/uL   Hemoglobin 11.1 (L) 13.0 - 17.0 g/dL   HCT 34.5 (L) 39.0 - 52.0 %   MCV 89.8 78.0 - 100.0 fL   MCH 28.9 26.0 - 34.0 pg   MCHC 32.2 30.0 - 36.0 g/dL   RDW 17.6 (H) 11.5 - 15.5 %   Platelets 140 (L) 150 - 400 K/uL  I-Stat CG4 Lactic Acid, ED     Status: None   Collection Time: 09/09/16 12:37 PM  Result Value Ref Range   Lactic Acid, Venous 1.00 0.5 - 1.9 mmol/L     Dg Chest 2 View  Result Date: 09/09/2016 CLINICAL DATA:  Productive cough for the past 2  weeks. EXAM: CHEST  2 VIEW COMPARISON:  08/13/2016. FINDINGS: Stable enlarged cardiac silhouette. Mild right basilar atelectasis. Clear left lung. Aortic arch calcification. Superior migration of the right humeral head and mild right AC joint spur formation. IMPRESSION: 1. Stable cardiomegaly 2. Mild right basilar atelectasis. 3. Evidence of a large chronic rotator cuff tear on the right. Electronically Signed   By: Claudie Revering M.D.   On: 09/09/2016 13:41   Ct Abdomen Pelvis W Contrast  Result Date: 09/09/2016 CLINICAL DATA:  Low abdominal pain. EXAM: CT ABDOMEN AND PELVIS WITH CONTRAST TECHNIQUE: Multidetector CT imaging of the abdomen and pelvis was performed using the standard protocol following bolus administration of intravenous contrast. CONTRAST:  ISOVUE-300 IOPAMIDOL (ISOVUE-300) INJECTION 61% COMPARISON:  CT abdomen pelvis 04/10/2016 FINDINGS: Lower chest: Bibasilar atelectasis, left greater than right. Hepatobiliary: No focal liver abnormality is seen. No gallstones, gallbladder wall thickening, or biliary dilatation. Pancreas:  Atrophic pancreas. 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 Adrenals/Urinary Tract: Adrenal glands are unremarkable. Kidneys are normal, without renal calculi, focal lesion, or hydronephrosis. Bladder is unremarkable. Stable cysts in the left inferior renal pole. Stomach/Bowel: The stomach and small bowel and decompressed. There is recurrent volvulus of the distal portion of the sigmoid colon, resulting in dilation of the upstream colonic loops. The transverse caliber of the dilated sigmoid colon reaches 9.2 cm. Small amount of fluid stool is seen proximal to the level of the volvulus. Vascular/Lymphatic: Aortic atherosclerosis. No enlarged abdominal or pelvic lymph nodes. Reproductive: The prostate gland is mildly enlarged. Other: Fluid containing right indirect inguinal hernia noted. Musculoskeletal:  Multilevel degenerative disc disease ; S shaped thoraco lumbar spine scoliosis. IMPRESSION: Recurrent distal sigmoid colon volvulus resulting in upstream colonic obstruction. The small bowel is decompressed. These results were called by telephone at the time of interpretation on 09/09/2016 at 3:35 pm to [facility], [place] , who verbally acknowledged these results. Electronically Signed   By: Fidela Salisbury M.D.   On: 09/09/2016 15:37    ROS:  As stated above in the HPI otherwise negative.  Blood pressure 133/83, pulse (!) 109, temperature 97.8 F (36.6 C), temperature source Oral, resp. rate (!) 24, SpO2 92 %.  PE: Gen: NAD, Alert and Oriented HEENT:  [place]/AT, EOMI Neck: Supple, no LAD Lungs: CTA Bilaterally CV: RRR without M/G/R ABM: Soft, tympanic, +BS Ext: No C/C/E  Assessment/Plan: 1) Recurrent sigmoid volvulus. 2) Afib. 3) ABM pain.  Plan: 1) Decompression.  Heidi Lemay D 09/09/2016, 4:25 PM

## 2016-09-09 NOTE — Progress Notes (Signed)
Unable to start Cardizem gtt at this time due to HR 70-80s and soft BPs (SBP 90s).  MD notified by text page.  Will continue to monitor.

## 2016-09-09 NOTE — Progress Notes (Addendum)
ANTICOAGULATION CONSULT NOTE - Initial Consult  Pharmacy Consult for heparin Indication: atrial fibrillation  No Known Allergies  Patient Measurements:    Heparin Dosing Weight: 66kg Weight 66kg on 08/13/16.  Vital Signs: Temp: 97.8 F (36.6 C) (03/18 1216) Temp Source: Oral (03/18 1216) BP: 127/105 (03/18 1730) Pulse Rate: 135 (03/18 1730)  Labs:  Recent Labs  09/09/16 1232  HGB 11.1*  HCT 34.5*  PLT 140*  CREATININE 0.91    CrCl cannot be calculated (Unknown ideal weight.).   Medical History: Past Medical History:  Diagnosis Date  . Anal fissure    Hx of  . Anemia   . Atrial fibrillation (HCC)    chronic anticaog - weintraub  . Congestive heart failure ([place])   . CRI (chronic renal insufficiency)   . Diverticulosis 04/12/1998   Left colon--Flex Sig  . GERD (gastroesophageal reflux disease)   . Internal hemorrhoids   . Osteoarthritis   . Prostate cancer ([place])   . PVD (peripheral vascular disease) ([place])   . 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 Splenic lesion   . Stroke [facility]) 1978 lower brain stem  . Torticollis, acquired   . Tubular adenoma   . Venous stasis dermatitis    hx venous ulcer/cellulitis 06/2011 R and 04/2011 L    Medications:  Prescriptions Prior to Admission  Medication Sig Dispense Refill Last Dose  . acetaminophen (TYLENOL) 325 MG tablet Take 650 mg by mouth every 6 (six) hours as needed for moderate pain.   unknown  . apixaban (ELIQUIS) 5 MG TABS tablet Take 1 tablet (5 mg total) by mouth 2 (two) times daily.   09/09/2016 at 0900  . cholecalciferol (VITAMIN D) 1000 UNITS tablet Take 1,000 Units by mouth daily.    09/09/2016 at Unknown time  . diltiazem (CARDIZEM CD) 180 MG 24 hr capsule Take 1 capsule (180 mg total) by mouth daily. 30 capsule 11 09/09/2016 at Unknown time  . doxazosin (CARDURA) 2 MG tablet Take 2 mg by mouth at bedtime.     09/08/2016 at Unknown time  . finasteride (PROSCAR) 5 MG tablet Take 5 mg by mouth  daily.   09/09/2016 at Unknown time  . furosemide (LASIX) 20 MG tablet Take 20 mg by mouth daily.   09/09/2016 at Unknown time  . iron polysaccharides (FERREX 150) 150 MG capsule Take 1 capsule (150 mg total) by mouth daily. 30 capsule 5 09/09/2016 at Unknown time  . magnesium oxide (MAG-OX) 400 MG tablet Take 400 mg by mouth daily.   09/09/2016 at Unknown time  . Multiple Vitamins-Minerals (PRESERVISION AREDS 2 PO) Take 1 capsule by mouth 2 (two) times daily.   09/09/2016 at Unknown time  . pantoprazole (PROTONIX) 40 MG tablet Take 1 tablet (40 mg total) by mouth daily.   09/09/2016 at Unknown time  . polyethylene glycol (MIRALAX / GLYCOLAX) packet Take 17 g by mouth daily.   09/09/2016 at Unknown time  . potassium chloride (KLOR-CON) 10 MEQ CR tablet Take 10 mEq by mouth daily.    09/09/2016 at Unknown time    Assessment: 83yo M admitted with N/V and abdominal pain on apixaban for A.fib. Recurrent volvulus was unable to be resolved endoscopically. Pharmacy is asked to start heparin infusion. Last apixaban dose was approximately 10 hours ago.   Goal of Therapy:  aPTT 66-102 sec Heparin level 0.3-0.7 units/ml Monitor platelets by anticoagulation protocol: Yes   Plan:  Follow up baseline aPTT and PT/INR. Start heparin  with a conservative bolus of 1500units then an infusion at 1050units/hr. Check aPTT and heparin level in 8hrs. Due to the effects of apixaban on heparin levels for 48-36 hours, we will use the aPTT to guide therapy until they correlate.  Check CBC q24h while on heparin. F/u daily.  Romeo Rabon, PharmD, pager [phone]. 09/09/2016,7:13 PM.

## 2016-09-09 NOTE — H&P (Addendum)
[facility] History and Physical  Eduardo Deleon AST:[id number] DOB: June 19, 1925 DOA: 09/09/2016  Referring physician: Alroy Deleon Eduardo: Eduardo Deleon  Specialists: GI  Chief Complaint: abd pain and vomit  83 y/o 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 Risa Grill Chronic diastolic heart failure-echocardiogram showed reserved EF 03/2016 and was felt to be reasonable cardiac risk for surgery if needed Chr aspiration Prior nonhealing ulcer left medial ankle Chronic kidney disease creatinine 1 History of stroke 1978  He has been admitted before multip,e for  Patient poor historian-cannot really tell me what is going on other than to say someone came by and will "fix him" [facility]-spoke to nursing at facility.  This am ~ lunch time pain in abd-worst pain in the abd-no stool on trying to use RR.  Told RN he had a stool 3 days prio-tylenol given. He vomited undigested food and was sent to ED.  At baseline can get up with assist-can feed self.  Watches TV.  Communicates by writing.  Some confusion at times.has a nephew who is around.  He wheels himself around in manual WC  In the emergency room found her BUN/creatinine elevated 24/0.9 above baseline 11/0.9 LFTs normal lactic acid 1, hemoglobin 11 white count 9 platelet 140 CT abdomen and pelvis recurrent distal sigmoid colon volvulus resulting in upstream obstruction Chest x-ray stable cardiomegaly with right atelectasis and rotator cuff on right   I'm unable to cane review of systems and he remained somewhat confused   Past Medical History:  Diagnosis Date  . Anal fissure    Hx of  . Anemia   . Atrial fibrillation (HCC)    chronic anticaog - weintraub  . Congestive heart  failure ([place])   . CRI (chronic renal insufficiency)   . Diverticulosis 04/12/1998   Left colon--Flex Sig  . GERD (gastroesophageal reflux disease)   . Internal hemorrhoids   . Osteoarthritis   . Prostate cancer ([place])   . PVD (peripheral vascular disease) ([place])   . 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 Splenic lesion   . Stroke [facility]) 1978 lower brain stem  . Torticollis, acquired   . Tubular adenoma   . Venous stasis dermatitis    hx venous ulcer/cellulitis 06/2011 R and 04/2011 L   Past Surgical History:  Procedure Laterality Date  . COLONOSCOPY     1988  . DIRECT LARYNGOSCOPY N/A 03/31/2014   Procedure: DIRECT LARYNGOSCOPY/EXAM UNDER ANESTHESIA;  Surgeon: Eduardo Marble, MD;  Location: [facility];  Service: ENT;  Laterality: N/A;  . FLEXIBLE SIGMOIDOSCOPY N/A 04/10/2016   Procedure: FLEXIBLE SIGMOIDOSCOPY;  Surgeon: Eduardo Bears, MD;  Location: [facility] ENDOSCOPY;  Service: Endoscopy;  Laterality: N/A;  . FLEXIBLE SIGMOIDOSCOPY N/A 08/13/2016   Procedure: FLEXIBLE SIGMOIDOSCOPY;  Surgeon: Eduardo Stabler, MD;  Location: [facility] ENDOSCOPY;  Service: Gastroenterology;  Laterality: N/A;  . HEMORRHOID SURGERY    . HERNIA REPAIR     left-1982, right - 1985  . JOINT REPLACEMENT  bilaterial knees  . LUMBAR LAMINECTOMY  Per patient  heriated disc in mid spine  . TONSILLECTOMY     Social History:  Social History   Social History Narrative  . No narrative on file    No Known Allergies  Family History  Problem Relation  Age of Onset  . Heart disease Father   . Breast cancer Mother   . Diabetes Brother   . Diabetes      grandmother  . Colon cancer Neg Hx     Prior to Admission medications   Medication Sig Start Date End Date Taking? Authorizing Provider  acetaminophen (TYLENOL) 325 MG tablet Take 650 mg by mouth every 6 (six) hours as needed for moderate pain.    Historical Provider, MD  apixaban (ELIQUIS) 5 MG TABS tablet Take 1 tablet (5 mg total) by mouth 2 (two) times  daily. 04/12/16   Eduardo Dubois, MD  cholecalciferol (VITAMIN D) 1000 UNITS tablet Take 1,000 Units by mouth daily.     Historical Provider, MD  diltiazem (CARDIZEM CD) 180 MG 24 hr capsule Take 1 capsule (180 mg total) by mouth daily. 04/07/13   Eduardo Harp, MD  doxazosin (CARDURA) 2 MG tablet Take 2 mg by mouth at bedtime.      Historical Provider, MD  finasteride (PROSCAR) 5 MG tablet Take 5 mg by mouth daily.    Historical Provider, MD  furosemide (LASIX) 20 MG tablet Take 20 mg by mouth daily.    Historical Provider, MD  iron polysaccharides (FERREX 150) 150 MG capsule Take 1 capsule (150 mg total) by mouth daily. 09/25/13   Eduardo Sine, MD  magnesium oxide (MAG-OX) 400 MG tablet Take 400 mg by mouth daily.    Historical Provider, MD  Multiple Vitamins-Minerals (PRESERVISION AREDS 2 PO) Take 1 capsule by mouth 2 (two) times daily.    Historical Provider, MD  pantoprazole (PROTONIX) 40 MG tablet Take 1 tablet (40 mg total) by mouth daily. 04/13/14   Eduardo Cousins, MD  polyethylene glycol [facility] / Floria Raveling) packet Take 17 g by mouth daily. 04/12/16   Eduardo Dubois, MD  potassium chloride (KLOR-CON) 10 MEQ CR tablet Take 10 mEq by mouth daily.     Historical Provider, MD   Physical Exam: Vitals:   09/09/16 1216 09/09/16 1340  BP: 112/77 133/83  Pulse: 90 (!) 109  Resp: 18 (!) 24  Temp: 97.8 F (36.6 C)   TempSrc: Oral   SpO2: 95% 92%    Allergic confused cannot orient significant torticollis to the left Chest clinically clear no added sound S1-S2 slightly tachycardic in A. fib Abdomen distended no rebound no guarding Rectal deferred No lower extremity edema but darkened lower extremities with a bandage over left medial malleolus Finger-nose-finger was not performed given the patient's level of confusion he is moving all 4 limbs equally however No lower extremity edema Throat is soft and supple  Labs on Admission:  Basic Metabolic Panel:  Recent Labs Lab  09/09/16 1232  NA 142  K 3.8  CL 110  CO2 27  GLUCOSE 126*  BUN 24*  CREATININE 0.91  CALCIUM 8.9   Liver Function Tests:  Recent Labs Lab 09/09/16 1232  AST 18  ALT 12*  ALKPHOS 68  BILITOT 1.1  PROT 6.7  ALBUMIN 3.8    Recent Labs Lab 09/09/16 1232  LIPASE 20   No results for input(s): AMMONIA in the last 168 hours. CBC:  Recent Labs Lab 09/09/16 1232  WBC 9.0  HGB 11.1*  HCT 34.5*  MCV 89.8  PLT 140*   Cardiac Enzymes: No results for input(s): CKTOTAL, CKMB, CKMBINDEX, TROPONINI in the last 168 hours.  BNP (last 3 results) No results for input(s): BNP in the last 8760 hours.  ProBNP (last 3 results) No  results for input(s): PROBNP in the last 8760 hours.  CBG: No results for input(s): GLUCAP in the last 168 hours.  Radiological Exams on Admission: Dg Chest 2 View  Result Date: 09/09/2016 CLINICAL DATA:  Productive cough for the past 2 weeks. EXAM: CHEST  2 VIEW COMPARISON:  08/13/2016. FINDINGS: Stable enlarged cardiac silhouette. Mild right basilar atelectasis. Clear left lung. Aortic arch calcification. Superior migration of the right humeral head and mild right AC joint spur formation. IMPRESSION: 1. Stable cardiomegaly 2. Mild right basilar atelectasis. 3. Evidence of a large chronic rotator cuff tear on the right. Electronically Signed   By: Claudie Revering M.D.   On: 09/09/2016 13:41   Ct Abdomen Pelvis W Contrast  Result Date: 09/09/2016 CLINICAL DATA:  Low abdominal pain. EXAM: CT ABDOMEN AND PELVIS WITH CONTRAST TECHNIQUE: Multidetector CT imaging of the abdomen and pelvis was performed using the standard protocol following bolus administration of intravenous contrast. CONTRAST:  ISOVUE-300 IOPAMIDOL (ISOVUE-300) INJECTION 61% COMPARISON:  CT abdomen pelvis 04/10/2016 FINDINGS: Lower chest: Bibasilar atelectasis, left greater than right. Hepatobiliary: No focal liver abnormality is seen. No gallstones, gallbladder wall thickening, or biliary  dilatation. Pancreas:  Atrophic pancreas. 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 Adrenals/Urinary Tract: Adrenal glands are unremarkable. Kidneys are normal, without renal calculi, focal lesion, or hydronephrosis. Bladder is unremarkable. Stable cysts in the left inferior renal pole. Stomach/Bowel: The stomach and small bowel and decompressed. There is recurrent volvulus of the distal portion of the sigmoid colon, resulting in dilation of the upstream colonic loops. The transverse caliber of the dilated sigmoid colon reaches 9.2 cm. Small amount of fluid stool is seen proximal to the level of the volvulus. Vascular/Lymphatic: Aortic atherosclerosis. No enlarged abdominal or pelvic lymph nodes. Reproductive: The prostate gland is mildly enlarged. Other: Fluid containing right indirect inguinal hernia noted. Musculoskeletal: Multilevel degenerative disc disease ; S shaped thoraco lumbar spine scoliosis. IMPRESSION: Recurrent distal sigmoid colon volvulus resulting in upstream colonic obstruction. The small bowel is decompressed. These results were called by telephone at the time of interpretation on 09/09/2016 at 3:35 pm to [facility], [place] , who verbally acknowledged these results. Electronically Signed   By: Fidela Salisbury M.D.   On: 09/09/2016 15:37    EKG: Independently reviewed. afib, no stt changes  Assessment/Plan  Recurrent sigmoid volvulus-this is a recurrent issue. He has refused surgery in the past. He has no specific medical power of attorney. Dr. Benson Norway will perform decompression but it is unclear if he would benefit or survive surgery  [facility] will resume his care in the morning--note that he's been cleared for surgery if needed in the past and we will need to discuss this again going forward-place NG tube if vomiting Atrial fibrillation Mali score 5-not controlled, usually is on Cardizem 180. We will place on  step down and control with IV Cardizem and do not convert as may need further procedures.-Heparin per pharmacy for atrial fibrillation History severe torticollis-makes intubation and management challenging. Monitor BPH/slow-growing prostate cancer-continue with a sip of water finasteride and Cardura as needed Prior CVA in 1978-relatively high functioning physically but occasional confusion-and cognition on hold orally placing on heparin Chronic aspiration-patient has been seen in the past with chronic aspiration and elected not to have a GI to. He seems to survive that admission and we may need to readdress goals going forward as this third separate incident that this has occurred on Chronic kidney disease  stage I to 2 with acute component of dehydration-saline as above until can take   DNAR No family present Return to skilled nursing facility when able Considering palliative care input May benefit from general surgery input once again if patient becomes more clear  Time spent: [place], [facility] Pager [phone]  If 7PM-7AM, please contact night-coverage [email or web address] Password [facility] 09/09/2016, 5:03 PM

## 2016-09-09 NOTE — Op Note (Signed)
[facility] Patient Name: Eduardo Deleon Procedure Date: 09/09/2016 MRN: [id number] Attending MD: Carol Ada , MD Date of Birth: 17-Oct-1924 CSN: [id number] Age: 83 Admit Type: Outpatient Procedure:                Flexible Sigmoidoscopy Indications:              Abnormal CT of the GI tract Providers:                Carol Ada, MD, Elna Breslow, RN, Alfonso Patten,                            Technician Referring MD:              Medicines:                None Complications:            No immediate complications. Estimated Blood Loss:     Estimated blood loss: none. Procedure:                Pre-Anesthesia Assessment:                           - Prior to the procedure, a History and Physical                            was performed, and patient medications and                            allergies were reviewed. The patient's tolerance of                            previous anesthesia was also reviewed. The risks                            and benefits of the procedure and the sedation                            options and risks were discussed with the patient.                            All questions were answered, and informed consent                            was obtained. Prior Anticoagulants: The patient has                            taken no previous anticoagulant or antiplatelet                            agents. ASA Grade Assessment: III - A patient with                            severe systemic disease. After reviewing the risks  and benefits, the patient was deemed in                            satisfactory condition to undergo the procedure.                           After obtaining informed consent, the scope was                            passed under direct vision. The EG-[id number] ([id number])                            scope was introduced through the anus and advanced                            to the the descending colon. The  flexible                            sigmoidoscopy was accomplished without difficulty.                            The patient tolerated the procedure well. The                            quality of the bowel preparation was adequate. Scope In: Scope Out: Findings:      A volvulus, with viable appearing mucosa, was found in the sigmoid       colon. Decompression of the volvulus was attempted but was unsuccessful.       Estimated blood loss: none.      The abdominal distension markedly improved with suctioning of the air,       but it is clear that his volvulus cannot be "flipped" over       endoscopically. I suspect that this was the case with the prior       decompressions. The abdominal examination post procedure revealed a soft       abdomen, but there was still tympany. The patient reported feeling much       better. Impression:               - Volvulus. Decompression unsuccessful.                           - No specimens collected. Moderate Sedation:      None. Recommendation:           - Return patient to hospital ward for ongoing care.                           - Resume regular diet.                           - The best long term option is surgical.                           - [facility] to assume care in the AM. Procedure Code(s):        --- Professional ---  [id number], Sigmoidoscopy, flexible; with decompression                            (for pathologic distention) (eg, volvulus,                            megacolon), including placement of decompression                            tube, when performed Diagnosis Code(s):        --- Professional ---                           K56.2, Volvulus                           R93.3, Abnormal findings on diagnostic imaging of                            other parts of digestive tract CPT copyright 2016 American [employer] Association. All rights reserved. The codes documented in this report are preliminary and upon  coder review may  be revised to meet current compliance requirements. Carol Ada, MD Carol Ada, MD 09/09/2016 5:39:17 PM This report has been signed electronically. Number of Addenda: 0

## 2016-09-09 NOTE — ED Triage Notes (Addendum)
EMS reports pt from [facility], history of bowel obstructions, pain started this am, last BM yesterday, no N/V. Pain 10. 114/72-90-CBG 138

## 2016-09-09 NOTE — ED Notes (Signed)
ED Provider at bedside. 

## 2016-09-09 NOTE — ED Notes (Addendum)
Pt reports low abd pain since 12 noon today.  States he is not passing any gas.  LBM was yesterday.  Pt's abd appears distended, taut and tender in his low abd.  No bowel sounds noted in his upper quadrants, faint bowel sounds noted in lower quadrants.

## 2016-09-09 NOTE — ED Provider Notes (Signed)
[facility] DEPT Provider Note   CSN: [id number] Arrival date & time: 09/09/16  1138     History   Chief Complaint Chief Complaint  Patient presents with  . Abdominal Pain    HPI Eduardo Deleon is a 83 y.o. male presenting via EMS from [facility] home with 1 week of sudden onset suprapubic pain which starts just below the navel and radiates down but doesn't involve his genitalia. He has had a small soft bowel movement yesterday. He also reports a productive cough for the past two weeks. Denies fever, chills, nausea, vomiting, bloody diarrhea, blood in the stool, melena, dysuria, hematuria or other symptoms.  HPI  Past Medical History:  Diagnosis Date  . Anal fissure    Hx of  . Anemia   . Atrial fibrillation (HCC)    chronic anticaog - [place]raub  . Congestive heart failure ([place])   . CRI (chronic renal insufficiency)   . Diverticulosis 04/12/1998   Left colon--Flex Sig  . GERD (gastroesophageal reflux disease)   . Internal hemorrhoids   . Osteoarthritis   . Prostate cancer ([place])   . PVD (peripheral vascular disease) ([place])   . 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 Splenic lesion   . Stroke [facility]) 1978 lower brain stem  . Torticollis, acquired   . Tubular adenoma   . Venous stasis dermatitis    hx venous ulcer/cellulitis 06/2011 R and 04/2011 L    Patient Active Problem List   Diagnosis Date Noted  . Abdominal distension   . Generalized abdominal pain   . Pressure injury of skin 04/11/2016  . Hypokalemia   . Volvulus ([place]) 04/10/2016  . Sigmoid volvulus ([place])   . Venous stasis dermatitis of both lower extremities 05/26/2015  . Non-compliant behavior 04/27/2014  . Acute respiratory failure ([place]) 03/29/2014  . Prostate CA ([place]) 10/11/2013  . Elevated brain natriuretic peptide (BNP) level 10/11/2013  . Altered mental status 10/08/2013  . Atrial fibrillation ([place]) 11/21/2012  . Long term current use of anticoagulant therapy 11/21/2012   . Heme positive stool 06/29/2011  . Lower extremity venous stasis 06/28/2011  . Torticollis 06/28/2011  . CRI (chronic renal insufficiency) 06/28/2011  . Anemia 05/20/2011  . GERD (gastroesophageal reflux disease) 05/19/2011  . BPH (benign prostatic hyperplasia) 05/19/2011  . Chronic systolic CHF (congestive heart failure) ([place]) 05/19/2011    Past Surgical History:  Procedure Laterality Date  . COLONOSCOPY     1988  . DIRECT LARYNGOSCOPY N/A 03/31/2014   Procedure: DIRECT LARYNGOSCOPY/EXAM UNDER ANESTHESIA;  Surgeon: [name], MD;  Location: [facility];  Service: ENT;  Laterality: N/A;  . FLEXIBLE SIGMOIDOSCOPY N/A 04/10/2016   Procedure: FLEXIBLE SIGMOIDOSCOPY;  Surgeon: Jerene Bears, MD;  Location[facility] ENDOSCOPY;  Service: Endoscopy;  Laterality: N/A;  . FLEXIBLE SIGMOIDOSCOPY N/A 08/13/2016   Procedure: FLEXIBLE SIGMOIDOSCOPY;  Surgeon: Doran Stabler, MD;  Location: [facility] ENDOSCOPY;  Service: Gastroenterology;  Laterality: N/A;  . HEMORRHOID SURGERY    . HERNIA REPAIR     left-1982, right - 1985  . JOINT REPLACEMENT  bilaterial knees  . LUMBAR LAMINECTOMY  Per patient  heriated disc in mid spine  . TONSILLECTOMY         Home Medications    Prior to Admission medications   Medication Sig Start Date End Date Taking? Authorizing Provider  acetaminophen (TYLENOL) 325 MG tablet Take 650 mg by mouth every 6 (six) hours as needed for moderate pain.  Historical Provider, MD  apixaban (ELIQUIS) 5 MG TABS tablet Take 1 tablet (5 mg total) by mouth 2 (two) times daily. 04/12/16   Barton Dubois, MD  cholecalciferol (VITAMIN D) 1000 UNITS tablet Take 1,000 Units by mouth daily.     Historical Provider, MD  diltiazem (CARDIZEM CD) 180 MG 24 hr capsule Take 1 capsule (180 mg total) by mouth daily. 04/07/13   Lorretta Harp, MD  doxazosin (CARDURA) 2 MG tablet Take 2 mg by mouth at bedtime.      Historical Provider, MD  finasteride (PROSCAR) 5 MG tablet Take 5 mg by mouth daily.     Historical Provider, MD  furosemide (LASIX) 20 MG tablet Take 20 mg by mouth daily.    Historical Provider, MD  iron polysaccharides (FERREX 150) 150 MG capsule Take 1 capsule (150 mg total) by mouth daily. 09/25/13   Troy Sine, MD  magnesium oxide (MAG-OX) 400 MG tablet Take 400 mg by mouth daily.    Historical Provider, MD  Multiple Vitamins-Minerals (PRESERVISION AREDS 2 PO) Take 1 capsule by mouth 2 (two) times daily.    Historical Provider, MD  pantoprazole (PROTONIX) 40 MG tablet Take 1 tablet (40 mg total) by mouth daily. 04/13/14   Charlynne Cousins, MD  polyethylene glycol [facility] / Floria Raveling) packet Take 17 g by mouth daily. 04/12/16   Barton Dubois, MD  potassium chloride (KLOR-CON) 10 MEQ CR tablet Take 10 mEq by mouth daily.     Historical Provider, MD    Family History Family History  Problem Relation Age of Onset  . Heart disease Father   . Breast cancer Mother   . Diabetes Brother   . Diabetes      grandmother  . Colon cancer Neg Hx     Social History Social History  Substance Use Topics  . Smoking status: Never Smoker  . Smokeless tobacco: Never Used     Comment: married, wife in [place] since 2013  . Alcohol use No     Allergies   Patient has no known allergies.   Review of Systems Review of Systems  Constitutional: Negative for chills and fever.  HENT: Negative for ear pain and sore throat.   Eyes: Negative for pain and visual disturbance.  Respiratory: Positive for cough. Negative for choking, chest tightness, shortness of breath, wheezing and stridor.   Cardiovascular: Negative for chest pain, palpitations and leg swelling.  Gastrointestinal: Positive for abdominal distention, abdominal pain and diarrhea. Negative for blood in stool, nausea, rectal pain and vomiting.  Genitourinary: Negative for difficulty urinating, discharge, dysuria, flank pain, hematuria, penile pain, penile swelling, scrotal swelling and testicular pain.  Musculoskeletal: Negative  for arthralgias, back pain, neck pain and neck stiffness.  Skin: Negative for color change, pallor, rash and wound.  Neurological: Negative for dizziness, seizures, syncope, speech difficulty, weakness, light-headedness and headaches.     Physical Exam Updated Vital Signs BP 133/83 (BP Location: Left Arm)   Pulse (!) 109   Temp 97.8 F (36.6 C) (Oral)   Resp (!) 24   SpO2 92%   Physical Exam  Constitutional: He appears well-developed and well-nourished. No distress.  Patient is afebrile, nontoxic-appearing, sitting comfortably in bed in no acute distress.  HENT:  Head: Normocephalic and atraumatic.  Eyes: Conjunctivae and EOM are normal.  Neck: Neck supple.  Cardiovascular: Normal rate, regular rhythm and normal heart sounds.   No murmur heard. Pulmonary/Chest: Effort normal and breath sounds normal. No respiratory distress. He has no  wheezes. He has no rales. He exhibits no tenderness.  Abdominal: Soft. Bowel sounds are normal. He exhibits distension. He exhibits no mass. There is tenderness. There is no rebound and no guarding.  Abdomen is distended and indurated. Tenderness in suprapubic region. No other abdominal tenderness. No pulsatile mass.  Musculoskeletal: He exhibits no edema.  Neurological: He is alert.  Skin: Skin is warm and dry. No rash noted. He is not diaphoretic. No erythema. No pallor.  Psychiatric: He has a normal mood and affect.  Nursing note and vitals reviewed.    ED Treatments / Results  Labs (all labs ordered are listed, but only abnormal results are displayed) Labs Reviewed  COMPREHENSIVE METABOLIC PANEL - Abnormal; Notable for the following:       Result Value   Glucose, Bld 126 (*)    BUN 24 (*)    ALT 12 (*)    All other components within normal limits  CBC - Abnormal; Notable for the following:    RBC 3.84 (*)    Hemoglobin 11.1 (*)    HCT 34.5 (*)    RDW 17.6 (*)    Platelets 140 (*)    All other components within normal limits    LIPASE, BLOOD  URINALYSIS, ROUTINE W REFLEX MICROSCOPIC  I-STAT CG4 LACTIC ACID, ED   Hemoglobin  Date Value Ref Range Status  09/09/2016 11.1 (L) 13.0 - 17.0 g/dL Final  08/16/2016 11.5 (L) 13.0 - 17.0 g/dL Final  08/15/2016 10.6 (L) 13.0 - 17.0 g/dL Final  08/14/2016 10.5 (L) 13.0 - 17.0 g/dL Final     EKG  EKG Interpretation  Date/Time:  Sunday September 09 2016 12:22:51 EDT Ventricular Rate:  89 PR Interval:    QRS Duration: 105 QT Interval:  382 QTC Calculation: 424 R Axis:   95 Text Interpretation:  Atrial fibrillation RSR' in V1 or V2, probably normal variant Inferior infarct, age indeterminate Baseline wander in lead(s) V3 V4 Confirmed by COOK  MD, BRIAN ([id number]) on 09/09/2016 2:54:26 PM       Radiology Dg Chest 2 View  Result Date: 09/09/2016 CLINICAL DATA:  Productive cough for the past 2 weeks. EXAM: CHEST  2 VIEW COMPARISON:  08/13/2016. FINDINGS: Stable enlarged cardiac silhouette. Mild right basilar atelectasis. Clear left lung. Aortic arch calcification. Superior migration of the right humeral head and mild right AC joint spur formation. IMPRESSION: 1. Stable cardiomegaly 2. Mild right basilar atelectasis. 3. Evidence of a large chronic rotator cuff tear on the right. Electronically Signed   By: Claudie Revering M.D.   On: 09/09/2016 13:41   Ct Abdomen Pelvis W Contrast  Result Date: 09/09/2016 CLINICAL DATA:  Low abdominal pain. EXAM: CT ABDOMEN AND PELVIS WITH CONTRAST TECHNIQUE: Multidetector CT imaging of the abdomen and pelvis was performed using the standard protocol following bolus administration of intravenous contrast. CONTRAST:  ISOVUE-300 IOPAMIDOL (ISOVUE-300) INJECTION 61% COMPARISON:  CT abdomen pelvis 04/10/2016 FINDINGS: Lower chest: Bibasilar atelectasis, left greater than right. Hepatobiliary: No focal liver abnormality is seen. No gallstones, gallbladder wall thickening, or biliary dilatation. Pancreas:  Atrophic pancreas. Spleen: Mottled appearance of the  inferior portion of the spleen thought to represent splenic hemangioma, stable. Adrenals/Urinary Tract: Adrenal glands are unremarkable. Kidneys are normal, without renal calculi, focal lesion, or hydronephrosis. Bladder is unremarkable. Stable cysts in the left inferior renal pole. Stomach/Bowel: The stomach and small bowel and decompressed. There is recurrent volvulus of the distal portion of the sigmoid colon, resulting in dilation of the upstream colonic loops.  The transverse caliber of the dilated sigmoid colon reaches 9.2 cm. Small amount of fluid stool is seen proximal to the level of the volvulus. Vascular/Lymphatic: Aortic atherosclerosis. No enlarged abdominal or pelvic lymph nodes. Reproductive: The prostate gland is mildly enlarged. Other: Fluid containing right indirect inguinal hernia noted. Musculoskeletal: Multilevel degenerative disc disease ; S shaped thoraco lumbar spine scoliosis. IMPRESSION: Recurrent distal sigmoid colon volvulus resulting in upstream colonic obstruction. The small bowel is decompressed. These results were called by telephone at the time of interpretation on 09/09/2016 at 3:35 pm to [facility] , who verbally acknowledged these results. Electronically Signed   By: Fidela Salisbury M.D.   On: 09/09/2016 15:37    Procedures Procedures (including critical care time)  Medications Ordered in ED Medications  morphine 4 MG/ML injection 2 mg (2 mg Intravenous Given 09/09/16 1305)  morphine 4 MG/ML injection 4 mg (4 mg Intravenous Given 09/09/16 1411)  iopamidol (ISOVUE-300) 61 % injection (100 mLs Intravenous Contrast Given 09/09/16 1447)  sodium chloride 0.9 % bolus 1,000 mL (1,000 mLs Intravenous New Bag/Given 09/09/16 1608)     Initial Impression / Assessment and Plan / ED Course  I have reviewed the triage vital signs and the nursing notes.  Pertinent labs & imaging results that were available during my care of the patient were reviewed by me and considered  in my medical decision making (see chart for details).     Patient with hx of afib, on eliquis, presents from nursing home with suprapubic pain for 1 week and productive cough for 2 weeks. Abdomen is firm and distended. Exam otherwise unremarkable. Patient is well-appearing and non-toxic. Stable vitals. Patient was admitted one month ago with recurrent sigmoid volvulus, GI decompressed with flex sigmoid and thought he ultimately would need surgery. General surgery consulted and felt he was a poor candidate for surgery.  Pain was managed while in the emergency department. CT showing recurrent sigmoid volvulus and significant dilation with colonic obstruction.  16:00 spoke to GI, who will be assessing him and likely flex sigmoid for decompression.  Spoke to hospitalist and he will be admitted.  Patient was discussed with Dr. Lacinda Axon who has seen patient and agrees with assessment and plan. Recommended adding another 4 morphine at this time as patient was still uncomfortable on his assessment.              Final Clinical Impressions(s) / ED Diagnoses   Final diagnoses:  Abdominal pain    New Prescriptions New Prescriptions   No medications on file     Dossie Der 09/09/16 [place], MD 09/10/16 [phone]

## 2016-09-09 NOTE — ED Notes (Signed)
Bed: WA07 Expected date:  Expected time:  Means of arrival:  Comments: 

## 2016-09-10 ENCOUNTER — Inpatient Hospital Stay (HOSPITAL_COMMUNITY): Payer: Medicare Other

## 2016-09-10 ENCOUNTER — Encounter (HOSPITAL_COMMUNITY): Payer: Self-pay | Admitting: Gastroenterology

## 2016-09-10 DIAGNOSIS — R935 Abnormal findings on diagnostic imaging of other abdominal regions, including retroperitoneum: Secondary | ICD-10-CM

## 2016-09-10 DIAGNOSIS — R1030 Lower abdominal pain, unspecified: Secondary | ICD-10-CM

## 2016-09-10 DIAGNOSIS — R109 Unspecified abdominal pain: Secondary | ICD-10-CM | POA: Insufficient documentation

## 2016-09-10 DIAGNOSIS — K562 Volvulus: Principal | ICD-10-CM

## 2016-09-10 LAB — COMPREHENSIVE METABOLIC PANEL
ALBUMIN: 3.3 g/dL — AB (ref 3.5–5.0)
ALT: 10 U/L — ABNORMAL LOW (ref 17–63)
ANION GAP: 6 (ref 5–15)
AST: 16 U/L (ref 15–41)
Alkaline Phosphatase: 61 U/L (ref 38–126)
BUN: 20 mg/dL (ref 6–20)
CO2: 27 mmol/L (ref 22–32)
Calcium: 8.2 mg/dL — ABNORMAL LOW (ref 8.9–10.3)
Chloride: 108 mmol/L (ref 101–111)
Creatinine, Ser: 0.86 mg/dL (ref 0.61–1.24)
GFR calc non Af Amer: >60 mL/min (ref >60)
GLUCOSE: 98 mg/dL (ref 65–99)
POTASSIUM: 3.5 mmol/L (ref 3.5–5.1)
Sodium: 141 mmol/L (ref 135–145)
Total Bilirubin: 1.1 mg/dL (ref 0.3–1.2)
Total Protein: 5.9 g/dL — ABNORMAL LOW (ref 6.5–8.1)

## 2016-09-10 LAB — CBC
HEMATOCRIT: 32.8 % — AB (ref 39.0–52.0)
HEMOGLOBIN: 10.7 g/dL — AB (ref 13.0–17.0)
MCH: 29.3 pg (ref 26.0–34.0)
MCHC: 32.6 g/dL (ref 30.0–36.0)
MCV: 89.9 fL (ref 78.0–100.0)
Platelets: UNDETERMINED 10*3/uL (ref 150–400)
RBC: 3.65 MIL/uL — AB (ref 4.22–5.81)
RDW: 17.8 % — ABNORMAL HIGH (ref 11.5–15.5)
WBC: 6.8 10*3/uL (ref 4.0–10.5)

## 2016-09-10 LAB — HEPARIN LEVEL (UNFRACTIONATED): Heparin Unfractionated: >2.2 IU/mL — ABNORMAL HIGH (ref 0.30–0.70)

## 2016-09-10 LAB — APTT
APTT: 199 s — AB (ref 24–36)
APTT: 135 s — AB (ref 24–36)

## 2016-09-10 LAB — PROTIME-INR
INR: 1.8
Prothrombin Time: 21.2 seconds — ABNORMAL HIGH (ref 11.4–15.2)

## 2016-09-10 MED ORDER — HEPARIN (PORCINE) IN NACL 100-0.45 UNIT/ML-% IJ SOLN
800.0000 [IU]/h | INTRAMUSCULAR | Status: DC
  Administered 2016-09-10: 800 [IU]/h via INTRAVENOUS
  Filled 2016-09-10 (×2): qty 250

## 2016-09-10 MED ORDER — HEPARIN (PORCINE) IN NACL 100-0.45 UNIT/ML-% IJ SOLN
700.0000 [IU]/h | INTRAMUSCULAR | Status: DC
  Administered 2016-09-10: 700 [IU]/h via INTRAVENOUS
  Filled 2016-09-10: qty 250

## 2016-09-10 NOTE — Progress Notes (Addendum)
ANTICOAGULATION CONSULT NOTE - Follow Up Consult  Pharmacy Consult for heparin Indication: atrial fibrillation  No Known Allergies  Patient Measurements: Height: 5\' 6"  (167.6 cm) Weight: 146 lb 9.7 oz (66.5 kg) IBW/kg (Calculated) : 63.8  Heparin Dosing Weight: 66kg Weight 66kg on 08/13/16.  Vital Signs: Temp: 98.5 F (36.9 C) (03/19 1200) Temp Source: Oral (03/19 1200) BP: 130/91 (03/19 1438) Pulse Rate: 100 (03/19 1438)  Labs:  Recent Labs  09/09/16 1232 09/09/16 2115 09/10/16 0331 09/10/16 1259  HGB 11.1*  --  10.7*  --   HCT 34.5*  --  32.8*  --   PLT 140*  --  PLATELET CLUMPS NOTED ON SMEAR, UNABLE TO ESTIMATE  --   APTT  --  142* 199* 135*  LABPROT  --   --  21.2*  --   INR  --   --  1.80  --   HEPARINUNFRC  --   --  >2.20*  --   CREATININE 0.91  --  0.86  --     Estimated Creatinine Clearance: 52.5 mL/min (by C-G formula based on SCr of 0.86 mg/dL).   Assessment: 83yo M admitted with N/V and abdominal pain on apixaban for A.fib. Recurrent volvulus was unable to be resolved endoscopically. Pharmacy is asked to start heparin infusion while apixaban on hold.  First heparin level (anti-Xa level) high as expected given recent apixaban use.  Due to the effects of apixaban on heparin levels, using aPTT to guide therapy until levels correlate (effects of apixaban diminished).  Last dose of apixaban reported as 3/18 at 0900.    Today, 09/10/2016: - aPTT 135 seconds, remains above goal despite reducing heparin infusion rate to 800 units/hr - Hgb low but appear stable. Plts low yesterday, were clumped on collection today. - RN noted some blood at the entry of the peripheral IV line site. Asked her to continue to monitor and contact pharmacy and MD if this worsens.   Goal of Therapy:  aPTT 66-102 sec Heparin level 0.3-0.7 units/ml Monitor platelets by anticoagulation protocol: Yes   Plan:  Reduce heparin infusion to 700 units/hr. Check aPTT in 8 hours. Check heparin  level at least daily to determine when levels correlate. Check CBC daily while on heparin. F/u daily.  Hershal Coria, PharmD, BCPS Pager: [phone] 09/10/2016 2:53 PM

## 2016-09-10 NOTE — Progress Notes (Signed)
[facility] Progress Note  Chief Complaint:   Abdominal distention  Subjective: He feels better today. Has some abdominal discomfort with coughing. No BM since admission  Objective:  Vital signs in last 24 hours: Temp:  [98 F (36.7 C)-98.5 F (36.9 C)] 98.5 F (36.9 C) (03/19 1200) Pulse Rate:  [64-135] 84 (03/19 0800) Resp:  [17-32] 22 (03/19 0800) BP: (80-134)/(34-112) 114/34 (03/19 0800) SpO2:  [90 %-100 %] 93 % (03/19 0800) Weight:  [146 lb 9.7 oz (66.5 kg)] 146 lb 9.7 oz (66.5 kg) (03/18 1920) Last BM Date:  (PTA, pt unsure) General:   Alert, frail white male in NAD EENT:  HOH, non icteric sclera, conjunctive pink.  Pulm: Normal respiratory effort, Abdomen:  Soft, mild-moderately distended, + bowel sounds. Not significantly tender.     Neurologic:  Alert and  oriented x4;  grossly normal neurologically. Psych:  Alert and cooperative. Normal mood and affect.   Intake/Output from previous day: 03/18 0701 - 03/19 0700 In: 932.1 [I.V.:932.1] Out: 900 [Urine:900] Intake/Output this shift: Total I/O In: 83 [I.V.:83] Out: -   Lab Results:  Recent Labs  09/09/16 1232 09/10/16 0331  WBC 9.0 6.8  HGB 11.1* 10.7*  HCT 34.5* 32.8*  PLT 140* PLATELET CLUMPS NOTED ON SMEAR, UNABLE TO ESTIMATE   BMET  Recent Labs  09/09/16 1232 09/10/16 0331  NA 142 141  K 3.8 3.5  CL 110 108  CO2 27 27  GLUCOSE 126* 98  BUN 24* 20  CREATININE 0.91 0.86  CALCIUM 8.9 8.2*   LFT  Recent Labs  09/10/16 0331  PROT 5.9*  ALBUMIN 3.3*  AST 16  ALT 10*  ALKPHOS 61  BILITOT 1.1   PT/INR  Recent Labs  09/10/16 0331  LABPROT 21.2*  INR 1.80   Hepatitis Panel No results for input(s): HEPBSAG, HCVAB, HEPAIGM, HEPBIGM in the last 72 hours.  Dg Chest 2 View  Result Date: 09/09/2016 CLINICAL DATA:  Productive cough for the past 2 weeks. EXAM: CHEST  2 VIEW COMPARISON:  08/13/2016. FINDINGS: Stable enlarged cardiac silhouette. Mild right basilar  atelectasis. Clear left lung. Aortic arch calcification. Superior migration of the right humeral head and mild right AC joint spur formation. IMPRESSION: 1. Stable cardiomegaly 2. Mild right basilar atelectasis. 3. Evidence of a large chronic rotator cuff tear on the right. Electronically Signed   By: Claudie Revering M.D.   On: 09/09/2016 13:41   Ct Abdomen Pelvis W Contrast  Result Date: 09/09/2016 CLINICAL DATA:  Low abdominal pain. EXAM: CT ABDOMEN AND PELVIS WITH CONTRAST TECHNIQUE: Multidetector CT imaging of the abdomen and pelvis was performed using the standard protocol following bolus administration of intravenous contrast. CONTRAST:  ISOVUE-300 IOPAMIDOL (ISOVUE-300) INJECTION 61% COMPARISON:  CT abdomen pelvis 04/10/2016 FINDINGS: Lower chest: Bibasilar atelectasis, left greater than right. Hepatobiliary: No focal liver abnormality is seen. No gallstones, gallbladder wall thickening, or biliary dilatation. Pancreas:  Atrophic pancreas. 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 Adrenals/Urinary Tract: Adrenal glands are unremarkable. Kidneys are normal, without renal calculi, focal lesion, or hydronephrosis. Bladder is unremarkable. Stable cysts in the left inferior renal pole. Stomach/Bowel: The stomach and small bowel and decompressed. There is recurrent volvulus of the distal portion of the sigmoid colon, resulting in dilation of the upstream colonic loops. The transverse caliber of the dilated sigmoid colon reaches 9.2 cm. Small amount of fluid stool is seen proximal to the level of the volvulus. Vascular/Lymphatic:  Aortic atherosclerosis. No enlarged abdominal or pelvic lymph nodes. Reproductive: The prostate gland is mildly enlarged. Other: Fluid containing right indirect inguinal hernia noted. Musculoskeletal: Multilevel degenerative disc disease ; S shaped thoraco lumbar spine scoliosis. IMPRESSION: Recurrent distal sigmoid  colon volvulus resulting in upstream colonic obstruction. The small bowel is decompressed. These results were called by telephone at the time of interpretation on 09/09/2016 at 3:35 pm to [facility], [place] , who verbally acknowledged these results. Electronically Signed   By: Fidela Salisbury M.D.   On: 09/09/2016 15:37    Assessment / Plan:   83 yo male with a recurrent sigmoid volvulus. He has undergone sigmoid decompression twice in the last year and is s/p attempted sigmoidoscopy with decompression yesterday by Dr. Benson Norway. Procedure unsuccessful.  -Previously deemed a poor surgical candidate by Surgery but would ask for reevaluation for consideration of a diverting colostomy.   Active Problems:   Volvulus ([place])   LOS: 1 day   Tye Savoy NP 09/10/2016, 12:39 PM  Pager number [phone]  GI ATTENDING  Interval history data reviewed. Case discussed with Dr. Benson Norway who colonoscoped the patient yesterday. Agree with interval progress note. Patient has had recurrent sigmoid volvulus. He is undergone endoscopic intervention for this problem 3 times in the past 5 months. Natural history for this problem in this patient is undergoing recurrence. Unsatisfactory endoscopic outcome yesterday per Dr. Benson Norway. At this point, recurrent and frequent endoscopic intervention a bit futile and not always effective. And in this instance, not definitive. Certainly high risk for surgery, as many of these patients are, but I would ask for a repeat surgical opinion given the recurrent frequent nature of this problem. We are available for questions if needed. Will sign off.  Docia Chuck. Geri Semin[place]., M.D. [facility] Division of Gastroenterology

## 2016-09-10 NOTE — Consult Note (Signed)
Reason for Consult:  Recurrent volvulus  Referring Physician: Dr. Nada Libman  Eduardo Deleon is an 83 y.o. male.  HPI: 83 year old male from [facility] skilled nursing facility here presents with recurrent sigmoid volvulus. He was here in October 2017 again in February 2018 and returns again on 09/09/16 with a sigmoid volvulus. He was admitted by medicine and seen by Dr. Benson Norway GI service he and taken to the endoscopy suite. A volvulus with viable appearing mucosa was found in the sigmoid colon.  Decompression the volvulus was attempted but was unsuccessful. Abdominal distention improved with suctioning but was Dr. Ulyses Amor opinion that the  volvulus cannot be flipped over endoscopically. On exam today he soft mild to moderately distended with bowel sounds and no significant tenderness. His last surgical evaluation was on 08/14/16. He was seen by Dr. Lucia Gaskins. At that time was noted he had a complex set of medical issues and would need a cardiology, and perhaps a pulmonary evaluation because of his severe torticollis. He is also extremely hard of hearing so it makes it difficult to really know what his understanding of the situation is. He has no family aside from his wife, who has some mild dementia and is a resident of [facility] also.  He is currently in the ICU  Afebrile, VSS, RR is up some in the 20's. Labs OK.  No films since the CT scan on admit yesterday. He is NPO,       Past Medical History:  Diagnosis Date  . Anal fissure    Hx of  . Anemia   . Atrial fibrillation (HCC)    chronic anticaog - weintraub  . Congestive heart failure ([place])   . CRI (chronic renal insufficiency)   . Diverticulosis 04/12/1998   Left colon--Flex Sig  . GERD (gastroesophageal reflux disease)   . Internal hemorrhoids   . Osteoarthritis   . Prostate cancer ([place])   . PVD (peripheral vascular disease) ([place])   . Sigmoid volvulus ([place])    recurrent - 10/17, 2/18  . Splenic lesion   . 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 Torticollis, acquired   . Tubular adenoma   . Venous stasis dermatitis    hx venous ulcer/cellulitis 06/2011 R and 04/2011 L    Past Surgical History:  Procedure Laterality Date  . COLONOSCOPY     1988  . DIRECT LARYNGOSCOPY N/A 03/31/2014   Procedure: DIRECT LARYNGOSCOPY/EXAM UNDER ANESTHESIA;  Surgeon: [name], MD;  Location: [facility];  Service: ENT;  Laterality: N/A;  . FLEXIBLE SIGMOIDOSCOPY N/A 04/10/2016   Procedure: FLEXIBLE SIGMOIDOSCOPY;  Surgeon: Jerene Bears, MD;  Location: [facility] ENDOSCOPY;  Service: Endoscopy;  Laterality: N/A;  . FLEXIBLE SIGMOIDOSCOPY N/A 08/13/2016   Procedure: FLEXIBLE SIGMOIDOSCOPY;  Surgeon: Doran Stabler, MD;  Location: [facility] ENDOSCOPY;  Service: Gastroenterology;  Laterality: N/A;  . FLEXIBLE SIGMOIDOSCOPY N/A 09/09/2016   Procedure: FLEXIBLE SIGMOIDOSCOPY;  Surgeon: Carol Ada, MD;  Location: [facility] ENDOSCOPY;  Service: Endoscopy;  Laterality: N/A;  . HEMORRHOID SURGERY    . HERNIA REPAIR     left-1982, right - 1985  . JOINT REPLACEMENT  bilaterial knees  . LUMBAR LAMINECTOMY  Per patient  heriated disc in mid spine  . TONSILLECTOMY      Family History  Problem Relation Age of Onset  . Heart disease Father   . Breast cancer Mother   . Diabetes Brother   . Diabetes      grandmother  . Colon cancer Neg Hx  Social History:  reports that he has never smoked. He has never used smokeless tobacco. He reports that he does not drink alcohol or use drugs.  Allergies: No Known Allergies  Medications:  Prior to Admission:  Prescriptions Prior to Admission  Medication Sig Dispense Refill Last Dose  . acetaminophen (TYLENOL) 325 MG tablet Take 650 mg by mouth every 6 (six) hours as needed for moderate pain.   unknown  . apixaban (ELIQUIS) 5 MG TABS tablet Take 1 tablet (5 mg total) by mouth 2 (two) times daily.   09/09/2016 at 0900  . cholecalciferol (VITAMIN D) 1000 UNITS tablet Take 1,000 Units by mouth daily.    09/09/2016 at Unknown  time  . diltiazem (CARDIZEM CD) 180 MG 24 hr capsule Take 1 capsule (180 mg total) by mouth daily. 30 capsule 11 09/09/2016 at Unknown time  . doxazosin (CARDURA) 2 MG tablet Take 2 mg by mouth at bedtime.     09/08/2016 at Unknown time  . finasteride (PROSCAR) 5 MG tablet Take 5 mg by mouth daily.   09/09/2016 at Unknown time  . furosemide (LASIX) 20 MG tablet Take 20 mg by mouth daily.   09/09/2016 at Unknown time  . iron polysaccharides (FERREX 150) 150 MG capsule Take 1 capsule (150 mg total) by mouth daily. 30 capsule 5 09/09/2016 at Unknown time  . magnesium oxide (MAG-OX) 400 MG tablet Take 400 mg by mouth daily.   09/09/2016 at Unknown time  . Multiple Vitamins-Minerals (PRESERVISION AREDS 2 PO) Take 1 capsule by mouth 2 (two) times daily.   09/09/2016 at Unknown time  . pantoprazole (PROTONIX) 40 MG tablet Take 1 tablet (40 mg total) by mouth daily.   09/09/2016 at Unknown time  . polyethylene glycol (MIRALAX / GLYCOLAX) packet Take 17 g by mouth daily.   09/09/2016 at Unknown time  . potassium chloride (KLOR-CON) 10 MEQ CR tablet Take 10 mEq by mouth daily.    09/09/2016 at Unknown time  Diet: Nothing by mouth Scheduled: . Chlorhexidine Gluconate Cloth  6 each Topical Q0600  . doxazosin  2 mg Oral QHS  . finasteride  5 mg Oral Daily  . mupirocin ointment  1 application Nasal BID  . sodium chloride flush  3 mL Intravenous Q12H   Continuous: . sodium chloride 75 mL/hr at 09/10/16 0918  . diltiazem (CARDIZEM) infusion    . heparin 800 Units/hr (09/10/16 1201)   PRN: Anti-infectives    None      Results for orders placed or performed during the hospital encounter of 09/09/16 (from the past 48 hour(s))  Lipase, blood     Status: None   Collection Time: 09/09/16 12:32 PM  Result Value Ref Range   Lipase 20 11 - 51 U/L  Comprehensive metabolic panel     Status: Abnormal   Collection Time: 09/09/16 12:32 PM  Result Value Ref Range   Sodium 142 135 - 145 mmol/L   Potassium 3.8 3.5 - 5.1  mmol/L   Chloride 110 101 - 111 mmol/L   CO2 27 22 - 32 mmol/L   Glucose, Bld 126 (H) 65 - 99 mg/dL   BUN 24 (H) 6 - 20 mg/dL   Creatinine, Ser 0.91 0.61 - 1.24 mg/dL   Calcium 8.9 8.9 - 10.3 mg/dL   Total Protein 6.7 6.5 - 8.1 g/dL   Albumin 3.8 3.5 - 5.0 g/dL   AST 18 15 - 41 U/L   ALT 12 (L) 17 - 63 U/L   Alkaline  Phosphatase 68 38 - 126 U/L   Total Bilirubin 1.1 0.3 - 1.2 mg/dL   GFR calc non Af Amer >60 >60 mL/min   GFR calc Af Amer >60 >60 mL/min    Comment: (NOTE) The eGFR has been calculated using the CKD EPI equation. This calculation has not been validated in all clinical situations. eGFR's persistently <60 mL/min signify possible Chronic Kidney Disease.    Anion gap 5 5 - 15  CBC     Status: Abnormal   Collection Time: 09/09/16 12:32 PM  Result Value Ref Range   WBC 9.0 4.0 - 10.5 K/uL   RBC 3.84 (L) 4.22 - 5.81 MIL/uL   Hemoglobin 11.1 (L) 13.0 - 17.0 g/dL   HCT 34.5 (L) 39.0 - 52.0 %   MCV 89.8 78.0 - 100.0 fL   MCH 28.9 26.0 - 34.0 pg   MCHC 32.2 30.0 - 36.0 g/dL   RDW 17.6 (H) 11.5 - 15.5 %   Platelets 140 (L) 150 - 400 K/uL  I-Stat CG4 Lactic Acid, ED     Status: None   Collection Time: 09/09/16 12:37 PM  Result Value Ref Range   Lactic Acid, Venous 1.00 0.5 - 1.9 mmol/L  APTT     Status: Abnormal   Collection Time: 09/09/16  9:15 PM  Result Value Ref Range   aPTT 142 (H) 24 - 36 seconds    Comment:        IF BASELINE aPTT IS ELEVATED, SUGGEST PATIENT RISK ASSESSMENT BE USED TO DETERMINE APPROPRIATE ANTICOAGULANT THERAPY.   Comprehensive metabolic panel     Status: Abnormal   Collection Time: 09/10/16  3:31 AM  Result Value Ref Range   Sodium 141 135 - 145 mmol/L   Potassium 3.5 3.5 - 5.1 mmol/L   Chloride 108 101 - 111 mmol/L   CO2 27 22 - 32 mmol/L   Glucose, Bld 98 65 - 99 mg/dL   BUN 20 6 - 20 mg/dL   Creatinine, Ser 0.86 0.61 - 1.24 mg/dL   Calcium 8.2 (L) 8.9 - 10.3 mg/dL   Total Protein 5.9 (L) 6.5 - 8.1 g/dL   Albumin 3.3 (L) 3.5 -  5.0 g/dL   AST 16 15 - 41 U/L   ALT 10 (L) 17 - 63 U/L   Alkaline Phosphatase 61 38 - 126 U/L   Total Bilirubin 1.1 0.3 - 1.2 mg/dL   GFR calc non Af Amer >60 >60 mL/min   GFR calc Af Amer >60 >60 mL/min    Comment: (NOTE) The eGFR has been calculated using the CKD EPI equation. This calculation has not been validated in all clinical situations. eGFR's persistently <60 mL/min signify possible Chronic Kidney Disease.    Anion gap 6 5 - 15  CBC     Status: Abnormal   Collection Time: 09/10/16  3:31 AM  Result Value Ref Range   WBC 6.8 4.0 - 10.5 K/uL   RBC 3.65 (L) 4.22 - 5.81 MIL/uL   Hemoglobin 10.7 (L) 13.0 - 17.0 g/dL   HCT 32.8 (L) 39.0 - 52.0 %   MCV 89.9 78.0 - 100.0 fL   MCH 29.3 26.0 - 34.0 pg   MCHC 32.6 30.0 - 36.0 g/dL   RDW 17.8 (H) 11.5 - 15.5 %   Platelets PLATELET CLUMPS NOTED ON SMEAR, UNABLE TO ESTIMATE 150 - 400 K/uL  Protime-INR     Status: Abnormal   Collection Time: 09/10/16  3:31 AM  Result Value Ref Range  Prothrombin Time 21.2 (H) 11.4 - 15.2 seconds   INR 1.80   Heparin level (unfractionated)     Status: Abnormal   Collection Time: 09/10/16  3:31 AM  Result Value Ref Range   Heparin Unfractionated >2.20 (H) 0.30 - 0.70 IU/mL    Comment: RESULTS CONFIRMED BY MANUAL DILUTION        IF HEPARIN RESULTS ARE BELOW EXPECTED VALUES, AND PATIENT DOSAGE HAS BEEN CONFIRMED, SUGGEST FOLLOW UP TESTING OF ANTITHROMBIN III LEVELS.   APTT     Status: Abnormal   Collection Time: 09/10/16  3:31 AM  Result Value Ref Range   aPTT 199 (HH) 24 - 36 seconds    Comment:        IF BASELINE aPTT IS ELEVATED, SUGGEST PATIENT RISK ASSESSMENT BE USED TO DETERMINE APPROPRIATE ANTICOAGULANT THERAPY. CRITICAL RESULT CALLED TO, READ BACK BY AND VERIFIED WITH: Neil Crouch RN [id number] 09/10/16 A NAVARRO     Dg Chest 2 View  Result Date: 09/09/2016 CLINICAL DATA:  Productive cough for the past 2 weeks. EXAM: CHEST  2 VIEW COMPARISON:  08/13/2016. FINDINGS: Stable enlarged  cardiac silhouette. Mild right basilar atelectasis. Clear left lung. Aortic arch calcification. Superior migration of the right humeral head and mild right AC joint spur formation. IMPRESSION: 1. Stable cardiomegaly 2. Mild right basilar atelectasis. 3. Evidence of a large chronic rotator cuff tear on the right. Electronically Signed   By: Claudie Revering M.D.   On: 09/09/2016 13:41   Ct Abdomen Pelvis W Contrast  Result Date: 09/09/2016 CLINICAL DATA:  Low abdominal pain. EXAM: CT ABDOMEN AND PELVIS WITH CONTRAST TECHNIQUE: Multidetector CT imaging of the abdomen and pelvis was performed using the standard protocol following bolus administration of intravenous contrast. CONTRAST:  ISOVUE-300 IOPAMIDOL (ISOVUE-300) INJECTION 61% COMPARISON:  CT abdomen pelvis 04/10/2016 FINDINGS: Lower chest: Bibasilar atelectasis, left greater than right. Hepatobiliary: No focal liver abnormality is seen. No gallstones, gallbladder wall thickening, or biliary dilatation. Pancreas:  Atrophic pancreas. 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 Adrenals/Urinary Tract: Adrenal glands are unremarkable. Kidneys are normal, without renal calculi, focal lesion, or hydronephrosis. Bladder is unremarkable. Stable cysts in the left inferior renal pole. Stomach/Bowel: The stomach and small bowel and decompressed. There is recurrent volvulus of the distal portion of the sigmoid colon, resulting in dilation of the upstream colonic loops. The transverse caliber of the dilated sigmoid colon reaches 9.2 cm. Small amount of fluid stool is seen proximal to the level of the volvulus. Vascular/Lymphatic: Aortic atherosclerosis. No enlarged abdominal or pelvic lymph nodes. Reproductive: The prostate gland is mildly enlarged. Other: Fluid containing right indirect inguinal hernia noted. Musculoskeletal: Multilevel degenerative disc disease ; S shaped thoraco lumbar spine scoliosis.  IMPRESSION: Recurrent distal sigmoid colon volvulus resulting in upstream colonic obstruction. The small bowel is decompressed. These results were called by telephone at the time of interpretation on 09/09/2016 at 3:35 pm to [facility], [place] , who verbally acknowledged these results. Electronically Signed   By: Fidela Salisbury M.D.   On: 09/09/2016 15:37    Review of Systems  Unable to perform ROS: Other  Genitourinary: Negative for dysuria.   Blood pressure 112/69, pulse 88, temperature 98.5 F (36.9 C), temperature source Oral, resp. rate (!) 24, height _0  (1.676 m), weight 66.5 kg (146 lb 9.7 oz), SpO2 94 %. Physical Exam  Constitutional: He is oriented to person, place, and time. No distress.  Elderly frail male, some abdominal  discomfort.  Feels better than yesterday.  HENT:  Head: Atraumatic.  Mouth/Throat: No oropharyngeal exudate.  Pt has severe torticollis.  Extremely hard of hearing you literally have to yell for him to hear you.  Eyes: Right eye exhibits no discharge. Left eye exhibits no discharge. No scleral icterus.  Neck: No JVD present. Tracheal deviation present. No thyromegaly present.  Cardiovascular: Normal rate, regular rhythm, normal heart sounds and intact distal pulses.   No murmur heard. Rate in 90's now, AF  Respiratory: Effort normal. No respiratory distress. He has no wheezes. He has no rales. He exhibits no tenderness.  Significant upper airway congestion.   GI: Soft. He exhibits distension. He exhibits no mass. There is tenderness. There is no rebound and no guarding.  Genitourinary:  Genitourinary Comments: Condom cath in place  Musculoskeletal: He exhibits no edema or tenderness.  Lymphadenopathy:    He has no cervical adenopathy.  Neurological: He is alert and oriented to person, place, and time.  Skin: Skin is warm and dry. No rash noted. He is not diaphoretic. No erythema. No pallor.  Psychiatric: He has a normal mood and affect. Judgment  normal.   . sodium chloride 75 mL/hr at 09/10/16 0918  . diltiazem (CARDIZEM) infusion    . heparin 800 Units/hr (09/10/16 1201)   Assessment/Plan: Recurrent sigmoid volvulus. Atrial fibrillation on Eliquis/last dose 08/13/16 Severe hearing deficit Hx of CVA History of congestive heart failure History of chronic renal insufficiency Severe torticollis History of prostate cancer - slow growing Osteoarthritis GERD DNR  Plan:  Patient has a complex set of medical issues. He would need medical/cardiology clearance. We will need anesthesia to evaluate and see if they can safely intubate him, before recommending any surgery. This is third episode since October.   Dr. [name] will review and discuss with patient and Dr. Verlon Au.   Alika Saladin 09/10/2016, 1:44 PM

## 2016-09-10 NOTE — Progress Notes (Signed)
Initial Nutrition Assessment  DOCUMENTATION CODES:   Not applicable  INTERVENTION:  - Diet advancement as medically feasible. - RD will monitor for needs with diet advancement.   NUTRITION DIAGNOSIS:   Inadequate oral intake related to inability to eat as evidenced by NPO status.  GOAL:   Patient will meet greater than or equal to 90% of their needs  MONITOR:   Diet advancement, Weight trends, Labs, Skin  REASON FOR ASSESSMENT:   Malnutrition Screening Tool  ASSESSMENT:   83 y.o. male presenting via EMS from [place] home with 1 week of sudden onset suprapubic pain which starts just below the navel and radiates down but doesn't involve his genitalia. He has had a small soft bowel movement yesterday. He also reports a productive cough for the past two weeks  Pt seen for MST. BMI indicates normal weight. Pt has been NPO since admission. Attempted flex sig yesterday but GI NP note at 1239 today states that this procedure was unsuccessful. Per GI NP note: Previously deemed a poor surgical candidate by Surgery but would ask for reevaluation for consideration of a diverting colostomy.   No family/visitors at bedside. Lab tech had just finished taking blood at time of RD visit. When introduced self to pt he stated a sequence of several numbers but was unable to state what any of these numbers meant. He went on to state "they have the most stations" and then repeated some of the numbers. Asked pt about any abdominal pain or nausea but he did not respond to this. Per notes, pt had denied these symptoms earlier today.  Pt seemed to be very HOH during visit although this is not documented in other notes. Per MD note, pt with chronic aspiration. Will monitor for ability for diet advancement. Pt missing many teeth.   Physical assessment limited to upper body only at this time and shows some mild muscle wasting around shoulder and clavicle areas; may be related to anatomy d/t  torticollis. Per chart review, weight has been stable since October 2017.  Medications reviewed. Labs reviewed; Ca: 8.2 mg/dL.  IVF: NS @ 75 mL/hr.    Diet Order:  Diet NPO time specified Except for: Sips with Meds  Skin:  Wound (see comment) (Stage 2 nonhealing L ankle pressure injury)  Last BM:  PTA/unknown  Height:   Ht Readings from Last 1 Encounters:  09/09/16 5\' 6"  (1.676 m)    Weight:   Wt Readings from Last 1 Encounters:  09/09/16 146 lb 9.7 oz (66.5 kg)    Ideal Body Weight:  64.54 kg  BMI:  Body mass index is 23.66 kg/m.  Estimated Nutritional Needs:   Kcal:  7903-8333 (22-25 kcal/kg)  Protein:  65-80 grams (1-1.2 grams/kg)  Fluid:  >/= 1.7 L/day  EDUCATION NEEDS:   No education needs identified at this time    Jarome Matin, MS, RD, LDN, CNSC Inpatient Clinical Dietitian Pager # [phone] After hours/weekend pager # [phone]

## 2016-09-10 NOTE — Progress Notes (Signed)
CRITICAL VALUE ALERT  Critical value received:  PTT 199  Date of notification:  09/10/16  Time of notification:  0407  Critical value read back: yes  Nurse who received alert:  Geannie Risen, RN  MD notified (1st page):  Called and spoke with RPh, dose adjusted per their instruction  Time of first page:  0410  MD notified (2nd page):  Time of second page:  Responding MD:  RPh  Time MD responded:  [phone]

## 2016-09-10 NOTE — Progress Notes (Signed)
ANTICOAGULATION CONSULT NOTE - Follow Up Consult  Pharmacy Consult for Heparin Indication: atrial fibrillation  No Known Allergies  Patient Measurements: Height: 5\' 6"  (167.6 cm) Weight: 146 lb 9.7 oz (66.5 kg) IBW/kg (Calculated) : 63.8 Heparin Dosing Weight:   Vital Signs: Temp: 98.1 F (36.7 C) (03/19 0340) Temp Source: Oral (03/19 0340) BP: 106/55 (03/19 0400) Pulse Rate: 85 (03/19 0400)  Labs:  Recent Labs  09/09/16 1232 09/09/16 2115 09/10/16 0331  HGB 11.1*  --  10.7*  HCT 34.5*  --  32.8*  PLT 140*  --  PLATELET CLUMPS NOTED ON SMEAR, UNABLE TO ESTIMATE  APTT  --  142* 199*  LABPROT  --   --  21.2*  INR  --   --  1.80  HEPARINUNFRC  --   --  >2.20*  CREATININE 0.91  --  0.86    Estimated Creatinine Clearance: 52.5 mL/min (by C-G formula based on SCr of 0.86 mg/dL).   Medications:  Infusions:  . sodium chloride    . sodium chloride 75 mL/hr at 09/09/16 2015  . diltiazem (CARDIZEM) infusion    . heparin      Assessment: Patient with high PTT.  PTT ordered with Heparin level until both correlate due to possible drug-lab interaction between oral anticoagulant (rivaroxaban, edoxaban, or apixaban) and anti-Xa level (aka heparin level).  No heparin issues per RN.  Goal of Therapy:  Heparin level 0.3-0.7 units/ml aPTT 66-102 seconds Monitor platelets by anticoagulation protocol: Yes   Plan:  Hold heparin until 0500 At 0500, resume heparin at 800 units/hr Recheck heparin level at [address] [place] 09/10/2016,5:06 AM

## 2016-09-10 NOTE — Progress Notes (Addendum)
PROGRESS NOTE    Eduardo Deleon  SPQ:[id number] DOB: 1924/07/08 DOA: 09/09/2016 PCP: Pcp Not In System  Outpatient Specialists:     Brief Narrative:  83 y/o 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 Risa Grill Chronic diastolic heart failure-echocardiogram showed reserved EF 03/2016 and was felt to be reasonable cardiac risk for surgery if needed Chr aspiration Prior nonhealing ulcer left medial ankle Chronic kidney disease creatinine 1 History of stroke 1978  He has been admitted before multiple epeiosdes of sigmoid volvulus and at some point was felt might benefit from surgery although his torticollis has been an impediment, he was successfully intuabted in 2015    Assessment & Plan:   Active Problems:   Volvulus ([place])   Assessment/Plan  Recurrent sigmoid volvulus-this is a recurrent issue. He has refused surgery in the past--he is ok considering surgery now-d/w gen surgery. Dr. Benson Norway attempted unsuccessful decompression 3/18  [facility] will resume his care in the morning--note that he's been cleared for surgery if needed in the past and we will need to discuss this again going forward-place NG tube if vomiting-keep npo for nowstill not passing gas or stool Atrial fibrillation [place] score 5-not controlled, usually is on Cardizem 180. -Heparin per pharmacy for atrial fibrillation--place on cardizem Gtt if above 120 sustained--slighlty hypotensive  On SDU so not started initally History severe torticollis-makes intubation and management challenging. Monitor BPH/slow-growing prostate cancer-continue with a sip of water finasteride and Cardura as needed Prior CVA in 1978-relatively high functioning physically but occasional confusion-ACon  hold orally placing on heparin  Chronic aspiration-patient has been seen in the past with chronic aspiration and elected not to have a GI tuve. He seems to survive that admission  Chronic kidney disease stage I to 2 with acute component of dehydration-saline as above until can take po    IV heparin trasnfer to tele Inpatient pending resolution No family   Consultants:   GI  Gen surg  Procedures:     Antimicrobials:       Subjective: Alert more oriented Tells me feels a little better-agreeable if needs surgery to have it done  Objective: Vitals:   09/10/16 0600 09/10/16 0700 09/10/16 0800 09/10/16 1200  BP: (!) 102/58 (!) 102/55 (!) 114/34   Pulse: 80 75 84   Resp: (!) 24 (!) 24 (!) 22   Temp:   98 F (36.7 C) 98.5 F (36.9 C)  TempSrc:   Oral Oral  SpO2: 91% 92% 93%   Weight:      Height:        Intake/Output Summary (Last 24 hours) at 09/10/16 1241 Last data filed at 09/10/16 0800  Gross per 24 hour  Intake          1015.13 ml  Output              900 ml  Net           115.13 ml   Filed Weights   09/09/16 1920  Weight: 66.5 kg (146 lb 9.7 oz)    Examination:  General exam: Appears calm and comfortable -severe torticollis Respiratory system: Clear to auscultation.  Cardiovascular system: S1 & S2 heard, RRR. No JVD, murmurs, rubs, gallops or clicks. No pedal edema. Gastrointestinal system: Abdomen is nondistended, soft and nontender. bs diminished  Data Reviewed: I have personally reviewed following labs and imaging studies  CBC:  Recent Labs Lab 09/09/16 1232 09/10/16 0331  WBC 9.0 6.8  HGB 11.1* 10.7*  HCT 34.5* 32.8*  MCV 89.8 89.9  PLT 140* PLATELET CLUMPS NOTED ON SMEAR, UNABLE TO ESTIMATE   Basic Metabolic Panel:  Recent Labs Lab 09/09/16 1232 09/10/16 0331  NA 142 141  K 3.8 3.5  CL 110 108  CO2 27 27  GLUCOSE 126* 98  BUN 24* 20  CREATININE 0.91 0.86  CALCIUM 8.9 8.2*   GFR: Estimated Creatinine Clearance: 52.5  mL/min (by C-G formula based on SCr of 0.86 mg/dL). Liver Function Tests:  Recent Labs Lab 09/09/16 1232 09/10/16 0331  AST 18 16  ALT 12* 10*  ALKPHOS 68 61  BILITOT 1.1 1.1  PROT 6.7 5.9*  ALBUMIN 3.8 3.3*    Recent Labs Lab 09/09/16 1232  LIPASE 20   No results for input(s): AMMONIA in the last 168 hours. Coagulation Profile:  Recent Labs Lab 09/10/16 0331  INR 1.80   Cardiac Enzymes: No results for input(s): CKTOTAL, CKMB, CKMBINDEX, TROPONINI in the last 168 hours. BNP (last 3 results) No results for input(s): PROBNP in the last 8760 hours. HbA1C: No results for input(s): HGBA1C in the last 72 hours. CBG: No results for input(s): GLUCAP in the last 168 hours. Lipid Profile: No results for input(s): CHOL, HDL, LDLCALC, TRIG, CHOLHDL, LDLDIRECT in the last 72 hours. Thyroid Function Tests: No results for input(s): TSH, T4TOTAL, FREET4, T3FREE, THYROIDAB in the last 72 hours. Anemia Panel: No results for input(s): VITAMINB12, FOLATE, FERRITIN, TIBC, IRON, RETICCTPCT in the last 72 hours. Urine analysis:    Component Value Date/Time   COLORURINE YELLOW 08/15/2016 2145   APPEARANCEUR CLEAR 08/15/2016 2145   LABSPEC 1.008 08/15/2016 2145   PHURINE 7.0 08/15/2016 2145   GLUCOSEU NEGATIVE 08/15/2016 2145   HGBUR NEGATIVE 08/15/2016 2145   BILIRUBINUR NEGATIVE 08/15/2016 2145   [place] NEGATIVE 08/15/2016 2145   PROTEINUR NEGATIVE 08/15/2016 2145   UROBILINOGEN 1.0 03/29/2014 1609   NITRITE POSITIVE (A) 08/15/2016 2145   LEUKOCYTESUR LARGE (A) 08/15/2016 2145   Sepsis Labs: @LABRCNTIP (procalcitonin:4,lacticidven:4)  )No results found for this or any previous visit (from the past 240 hour(s)).       Radiology Studies: Dg Chest 2 View  Result Date: 09/09/2016 CLINICAL DATA:  Productive cough for the past 2 weeks. EXAM: CHEST  2 VIEW COMPARISON:  08/13/2016. FINDINGS: Stable enlarged cardiac silhouette. Mild right basilar atelectasis. Clear left lung.  Aortic arch calcification. Superior migration of the right humeral head and mild right AC joint spur formation. IMPRESSION: 1. Stable cardiomegaly 2. Mild right basilar atelectasis. 3. Evidence of a large chronic rotator cuff tear on the right. Electronically Signed   By: Claudie Revering M.D.   On: 09/09/2016 13:41   Ct Abdomen Pelvis W Contrast  Result Date: 09/09/2016 CLINICAL DATA:  Low abdominal pain. EXAM: CT ABDOMEN AND PELVIS WITH CONTRAST TECHNIQUE: Multidetector CT imaging of the abdomen and pelvis was performed using the standard protocol following bolus administration of intravenous contrast. CONTRAST:  ISOVUE-300 IOPAMIDOL (ISOVUE-300) INJECTION 61% COMPARISON:  CT abdomen pelvis 04/10/2016 FINDINGS: Lower chest: Bibasilar atelectasis, left greater than right. Hepatobiliary: No focal liver abnormality is seen. No gallstones, gallbladder wall thickening, or biliary dilatation. Pancreas:  Atrophic pancreas. 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 Adrenals/Urinary Tract: Adrenal glands are unremarkable. Kidneys are normal, without renal calculi, focal  lesion, or hydronephrosis. Bladder is unremarkable. Stable cysts in the left inferior renal pole. Stomach/Bowel: The stomach and small bowel and decompressed. There is recurrent volvulus of the distal portion of the sigmoid colon, resulting in dilation of the upstream colonic loops. The transverse caliber of the dilated sigmoid colon reaches 9.2 cm. Small amount of fluid stool is seen proximal to the level of the volvulus. Vascular/Lymphatic: Aortic atherosclerosis. No enlarged abdominal or pelvic lymph nodes. Reproductive: The prostate gland is mildly enlarged. Other: Fluid containing right indirect inguinal hernia noted. Musculoskeletal: Multilevel degenerative disc disease ; S shaped thoraco lumbar spine scoliosis. IMPRESSION: Recurrent distal sigmoid colon volvulus resulting in  upstream colonic obstruction. The small bowel is decompressed. These results were called by telephone at the time of interpretation on 09/09/2016 at 3:35 pm to [facility], [place] , who verbally acknowledged these results. Electronically Signed   By: Fidela Salisbury M.D.   On: 09/09/2016 15:37        Scheduled Meds: . Chlorhexidine Gluconate Cloth  6 each Topical Q0600  . doxazosin  2 mg Oral QHS  . finasteride  5 mg Oral Daily  . mupirocin ointment  1 application Nasal BID  . sodium chloride flush  3 mL Intravenous Q12H   Continuous Infusions: . sodium chloride    . sodium chloride 75 mL/hr at 09/10/16 0918  . diltiazem (CARDIZEM) infusion    . heparin 800 Units/hr (09/10/16 1201)     LOS: 1 day    Time spent: [place], MD [facility]   If 7PM-7AM, please contact night-coverage [email or web address] Password [facility] 09/10/2016, 12:41 PM

## 2016-09-10 NOTE — Care Management Note (Signed)
Case Management Note  Patient Details  Name: ZEIN HELBING MRN: [id number] Date of Birth: 21-Sep-1924  Subjective/Objective:    Sigmoid colonic volvulus with obstruction/                 Action/Plan:  From [facility] Date:  September 10, 2016 Chart reviewed for concurrent status and case management needs. Will continue to follow patient progress. Discharge Planning: following for needs Expected discharge date: [id number] Velva Harman, BSN, [place], [place]  Expected Discharge Date:                  Expected Discharge Plan:  [place]  In-House Referral:  Clinical Social Work  Discharge planning Services     Post Acute Care Choice:    Choice offered to:     DME Arranged:    DME Agency:     HH Arranged:    [place] Agency:     Status of Service:  In process, will continue to follow  If discussed at Long Length of Stay Meetings, dates discussed:    Additional Comments:  Leeroy Cha, RN 09/10/2016, 10:59 AM

## 2016-09-11 ENCOUNTER — Inpatient Hospital Stay (HOSPITAL_COMMUNITY): Payer: Medicare Other

## 2016-09-11 DIAGNOSIS — K56609 Unspecified intestinal obstruction, unspecified as to partial versus complete obstruction: Secondary | ICD-10-CM | POA: Diagnosis present

## 2016-09-11 DIAGNOSIS — I481 Persistent atrial fibrillation: Secondary | ICD-10-CM

## 2016-09-11 DIAGNOSIS — Z01810 Encounter for preprocedural cardiovascular examination: Secondary | ICD-10-CM

## 2016-09-11 LAB — COMPREHENSIVE METABOLIC PANEL
ALT: 12 U/L — AB (ref 17–63)
ANION GAP: 8 (ref 5–15)
AST: 23 U/L (ref 15–41)
Albumin: 3.2 g/dL — ABNORMAL LOW (ref 3.5–5.0)
Alkaline Phosphatase: 69 U/L (ref 38–126)
BUN: 19 mg/dL (ref 6–20)
CHLORIDE: 112 mmol/L — AB (ref 101–111)
CO2: 23 mmol/L (ref 22–32)
Calcium: 8.2 mg/dL — ABNORMAL LOW (ref 8.9–10.3)
Creatinine, Ser: 0.81 mg/dL (ref 0.61–1.24)
GFR calc Af Amer: >60 mL/min (ref >60)
GFR calc non Af Amer: >60 mL/min (ref >60)
Glucose, Bld: 74 mg/dL (ref 65–99)
POTASSIUM: 3.1 mmol/L — AB (ref 3.5–5.1)
SODIUM: 143 mmol/L (ref 135–145)
Total Bilirubin: 1.4 mg/dL — ABNORMAL HIGH (ref 0.3–1.2)
Total Protein: 5.8 g/dL — ABNORMAL LOW (ref 6.5–8.1)

## 2016-09-11 LAB — APTT
APTT: 116 s — AB (ref 24–36)
APTT: 77 s — AB (ref 24–36)
aPTT: 64 seconds — ABNORMAL HIGH (ref 24–36)

## 2016-09-11 LAB — CBC WITH DIFFERENTIAL/PLATELET
Basophils Absolute: 0 10*3/uL (ref 0.0–0.1)
Basophils Relative: 0 %
EOS ABS: 0.1 10*3/uL (ref 0.0–0.7)
EOS PCT: 2 %
HCT: 32.2 % — ABNORMAL LOW (ref 39.0–52.0)
Hemoglobin: 10.2 g/dL — ABNORMAL LOW (ref 13.0–17.0)
LYMPHS ABS: 0.7 10*3/uL (ref 0.7–4.0)
Lymphocytes Relative: 15 %
MCH: 28.3 pg (ref 26.0–34.0)
MCHC: 31.7 g/dL (ref 30.0–36.0)
MCV: 89.4 fL (ref 78.0–100.0)
MONO ABS: 0.3 10*3/uL (ref 0.1–1.0)
Monocytes Relative: 6 %
Neutro Abs: 3.5 10*3/uL (ref 1.7–7.7)
Neutrophils Relative %: 76 %
PLATELETS: 119 10*3/uL — AB (ref 150–400)
RBC: 3.6 MIL/uL — AB (ref 4.22–5.81)
RDW: 17.7 % — AB (ref 11.5–15.5)
WBC: 4.6 10*3/uL (ref 4.0–10.5)

## 2016-09-11 LAB — HEPARIN LEVEL (UNFRACTIONATED): HEPARIN UNFRACTIONATED: 1.76 [IU]/mL — AB (ref 0.30–0.70)

## 2016-09-11 MED ORDER — HEPARIN (PORCINE) IN NACL 100-0.45 UNIT/ML-% IJ SOLN
550.0000 [IU]/h | INTRAMUSCULAR | Status: DC
  Filled 2016-09-11: qty 250

## 2016-09-11 MED ORDER — SODIUM CHLORIDE 0.9 % IV SOLN
INTRAVENOUS | Status: DC
  Administered 2016-09-11 – 2016-09-13 (×4): via INTRAVENOUS
  Filled 2016-09-11 (×6): qty 1000

## 2016-09-11 MED ORDER — HEPARIN (PORCINE) IN NACL 100-0.45 UNIT/ML-% IJ SOLN
600.0000 [IU]/h | INTRAMUSCULAR | Status: DC
  Administered 2016-09-11 – 2016-09-12 (×2): 600 [IU]/h via INTRAVENOUS
  Filled 2016-09-11 (×3): qty 250

## 2016-09-11 MED ORDER — HEPARIN (PORCINE) IN NACL 100-0.45 UNIT/ML-% IJ SOLN
650.0000 [IU]/h | INTRAMUSCULAR | Status: DC
  Filled 2016-09-11: qty 250

## 2016-09-11 NOTE — Progress Notes (Signed)
PROGRESS NOTE    Eduardo Deleon  KDT:[id number] DOB: December 03, 1924 DOA: 09/09/2016 PCP: Pcp Not In System  Outpatient Specialists:     Brief Narrative:  83 y/o 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 Risa Grill Chronic diastolic heart failure-echocardiogram showed reserved EF 03/2016 and was felt to be reasonable cardiac risk for surgery if needed Chr aspiration Prior nonhealing ulcer left medial ankle Chronic kidney disease creatinine 1 History of stroke 1978  He has been admitted before multiple epeiosdes of sigmoid volvulus and at some point was felt might benefit from surgery although his torticollis has been an impediment, he was successfully intuabted in 2015    Assessment & Plan:   Active Problems:   Volvulus ([place])   Abdominal pain   Abnormal CT of the abdomen   Assessment/Plan  Recurrent sigmoid volvulus-this is a recurrent issue. AXR this am shows persisiting volvulus although patient claims to have passed stool Heas refused surgery in the past--GI to reassess and see if can decompress again.  D/c Gen surgery and they can coordinate anesthesia Atrial fibrillation [place] score 5-not controlled, usually is on Cardizem 180. -Heparin per pharmacy for atrial fibrillation--place on cardizem Gtt if above 120 sustained--slightly hypotensive  On SDU so not started initally Mild hypokalemia-added 40 K to IV saline 75 cc/h-continue for now History severe torticollis-makes intubation and management challenging. Monitor--anesthesia input in advance would be appreciated-will consult pulmonary for thought on pre-op risk BPH/slow-growing prostate cancer-continue with a sip of water finasteride and Cardura as needed Prior CVA in 1978-relatively  high functioning physically but occasional confusion-AC on hold orally placing on heparin  Chronic aspiration-patient has been seen in the past with chronic aspiration and elected not to have a GI tuve. Chronic kidney disease stage I to 2 with acute component of dehydration-saline as above until can take po    IV heparin trasnfer to tele Inpatient pending resolution No family   Consultants:   GI  Gen surg  Procedures:     Antimicrobials:       Subjective:  Much mor ealert  Pleasant still HOH but able to tell me passed 2 stools ovenright Feels fair No n/v  Objective: Vitals:   09/11/16 0800 09/11/16 0813 09/11/16 0900 09/11/16 1000  BP:  (!) 150/70    Pulse: 93 95 (!) 107 (!) 105  Resp: (!) 25 (!) 24 (!) 25 (!) 27  Temp:  98 F (36.7 C)    TempSrc:  Oral    SpO2: 93% 99% 97% 97%  Weight:      Height:        Intake/Output Summary (Last 24 hours) at 09/11/16 1115 Last data filed at 09/11/16 0800  Gross per 24 hour  Intake          1583.17 ml  Output              331 ml  Net          1252.17 ml   Filed Weights   09/09/16 1920  Weight: 66.5 kg (146 lb 9.7 oz)    Examination:  General exam: Appears calm and comfortable -severe torticollis Respiratory system: Clear to auscultation.  Cardiovascular system: S1 & S2 heard, irreg RRR. No JVD Gastrointestinal system: Abdomen is nondistended, soft and nontender. bs diminished   Data Reviewed: I  have personally reviewed following labs and imaging studies  CBC:  Recent Labs Lab 09/09/16 1232 09/10/16 0331 09/11/16 0403  WBC 9.0 6.8 4.6  NEUTROABS  --   --  3.5  HGB 11.1* 10.7* 10.2*  HCT 34.5* 32.8* 32.2*  MCV 89.8 89.9 89.4  PLT 140* PLATELET CLUMPS NOTED ON SMEAR, UNABLE TO ESTIMATE 856*   Basic Metabolic Panel:  Recent Labs Lab 09/09/16 1232 09/10/16 0331 09/11/16 0403  NA 142 141 143  K 3.8 3.5 3.1*  CL 110 108 112*  CO2 27 27 23   GLUCOSE 126* 98 74  BUN 24* 20 19  CREATININE 0.91  0.86 0.81  CALCIUM 8.9 8.2* 8.2*   GFR: Estimated Creatinine Clearance: 55.8 mL/min (by C-G formula based on SCr of 0.81 mg/dL). Liver Function Tests:  Recent Labs Lab 09/09/16 1232 09/10/16 0331 09/11/16 0403  AST 18 16 23   ALT 12* 10* 12*  ALKPHOS 68 61 69  BILITOT 1.1 1.1 1.4*  PROT 6.7 5.9* 5.8*  ALBUMIN 3.8 3.3* 3.2*    Recent Labs Lab 09/09/16 1232  LIPASE 20   No results for input(s): AMMONIA in the last 168 hours. Coagulation Profile:  Recent Labs Lab 09/10/16 0331  INR 1.80   Cardiac Enzymes: No results for input(s): CKTOTAL, CKMB, CKMBINDEX, TROPONINI in the last 168 hours. BNP (last 3 results) No results for input(s): PROBNP in the last 8760 hours. HbA1C: No results for input(s): HGBA1C in the last 72 hours. CBG: No results for input(s): GLUCAP in the last 168 hours. Lipid Profile: No results for input(s): CHOL, HDL, LDLCALC, TRIG, CHOLHDL, LDLDIRECT in the last 72 hours. Thyroid Function Tests: No results for input(s): TSH, T4TOTAL, FREET4, T3FREE, THYROIDAB in the last 72 hours. Anemia Panel: No results for input(s): VITAMINB12, FOLATE, FERRITIN, TIBC, IRON, RETICCTPCT in the last 72 hours. Urine analysis:    Component Value Date/Time   COLORURINE YELLOW 08/15/2016 2145   APPEARANCEUR CLEAR 08/15/2016 2145   LABSPEC 1.008 08/15/2016 2145   PHURINE 7.0 08/15/2016 2145   GLUCOSEU NEGATIVE 08/15/2016 2145   HGBUR NEGATIVE 08/15/2016 2145   BILIRUBINUR NEGATIVE 08/15/2016 2145   [place] NEGATIVE 08/15/2016 2145   PROTEINUR NEGATIVE 08/15/2016 2145   UROBILINOGEN 1.0 03/29/2014 1609   NITRITE POSITIVE (A) 08/15/2016 2145   LEUKOCYTESUR LARGE (A) 08/15/2016 2145   Sepsis Labs: @LABRCNTIP (procalcitonin:4,lacticidven:4)  )No results found for this or any previous visit (from the past 240 hour(s)).       Radiology Studies: Dg Chest 2 View  Result Date: 09/09/2016 CLINICAL DATA:  Productive cough for the past 2 weeks. EXAM: CHEST  2 VIEW  COMPARISON:  08/13/2016. FINDINGS: Stable enlarged cardiac silhouette. Mild right basilar atelectasis. Clear left lung. Aortic arch calcification. Superior migration of the right humeral head and mild right AC joint spur formation. IMPRESSION: 1. Stable cardiomegaly 2. Mild right basilar atelectasis. 3. Evidence of a large chronic rotator cuff tear on the right. Electronically Signed   By: Claudie Revering M.D.   On: 09/09/2016 13:41   Ct Abdomen Pelvis W Contrast  Result Date: 09/09/2016 CLINICAL DATA:  Low abdominal pain. EXAM: CT ABDOMEN AND PELVIS WITH CONTRAST TECHNIQUE: Multidetector CT imaging of the abdomen and pelvis was performed using the standard protocol following bolus administration of intravenous contrast. CONTRAST:  ISOVUE-300 IOPAMIDOL (ISOVUE-300) INJECTION 61% COMPARISON:  CT abdomen pelvis 04/10/2016 FINDINGS: Lower chest: Bibasilar atelectasis, left greater than right. Hepatobiliary: No focal liver abnormality is seen. No gallstones, gallbladder wall thickening, or biliary dilatation. Pancreas:  Atrophic pancreas. 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 Adrenals/Urinary Tract: Adrenal glands are unremarkable. Kidneys are normal, without renal calculi, focal lesion, or hydronephrosis. Bladder is unremarkable. Stable cysts in the left inferior renal pole. Stomach/Bowel: The stomach and small bowel and decompressed. There is recurrent volvulus of the distal portion of the sigmoid colon, resulting in dilation of the upstream colonic loops. The transverse caliber of the dilated sigmoid colon reaches 9.2 cm. Small amount of fluid stool is seen proximal to the level of the volvulus. Vascular/Lymphatic: Aortic atherosclerosis. No enlarged abdominal or pelvic lymph nodes. Reproductive: The prostate gland is mildly enlarged. Other: Fluid containing right indirect inguinal hernia noted. Musculoskeletal: Multilevel degenerative disc  disease ; S shaped thoraco lumbar spine scoliosis. IMPRESSION: Recurrent distal sigmoid colon volvulus resulting in upstream colonic obstruction. The small bowel is decompressed. These results were called by telephone at the time of interpretation on 09/09/2016 at 3:35 pm to [facility], [place] , who verbally acknowledged these results. Electronically Signed   By: Fidela Salisbury M.D.   On: 09/09/2016 15:37   Dg Abd Portable 1v  Result Date: 09/11/2016 CLINICAL DATA:  83 year old male with abdominal distention. Subsequent encounter. EXAM: PORTABLE ABDOMEN - 1 VIEW COMPARISON:  09/10/2016 plain film exam.  09/09/2016 CT. FINDINGS: Persistent abnormal bowel gas pattern with gas distended (measuring up to 9 cm as on prior exam) superiorly extending sigmoid colon which may reflect changes of sigmoid volvulus. The possibility of free intraperitoneal air cannot be assessed on a supine view. Scoliosis convex left.  Hip degenerative change greater on left. IMPRESSION: Findings consistent with sigmoid volvulus similar to prior exam as noted above. Electronically Signed   By: Genia Del M.D.   On: 09/11/2016 09:46   Dg Abd Portable 1v  Result Date: 09/10/2016 CLINICAL DATA:  Sigmoid volvulus EXAM: PORTABLE ABDOMEN - 1 VIEW COMPARISON:  09/09/2016 FINDINGS: Persistent gaseous distension of the sigmoid colon with paucity of bowel gas within pelvis highly suspicious for recurrent sigmoid colon volvulus. IMPRESSION: Persistent gaseous distension of the sigmoid colon with paucity of bowel gas within pelvis highly suspicious for recurrent sigmoid colon volvulus. Electronically Signed   By: Lahoma Crocker M.D.   On: 09/10/2016 17:25        Scheduled Meds: . Chlorhexidine Gluconate Cloth  6 each Topical Q0600  . doxazosin  2 mg Oral QHS  . finasteride  5 mg Oral Daily  . mupirocin ointment  1 application Nasal BID  . sodium chloride flush  3 mL Intravenous Q12H   Continuous Infusions: . diltiazem (CARDIZEM)  infusion    . heparin 550 Units/hr (09/11/16 0800)  . sodium chloride 0.9 % 1,000 mL with potassium chloride 40 mEq infusion 75 mL/hr at 09/11/16 0922     LOS: 2 days    Time spent: [place], MD [facility]   If 7PM-7AM, please contact night-coverage [email or web address] Password [email or web address] 09/11/2016, 11:15 AM

## 2016-09-11 NOTE — Progress Notes (Signed)
ANTICOAGULATION CONSULT NOTE - Follow Up Consult  Pharmacy Consult for heparin Indication: atrial fibrillation  No Known Allergies  Patient Measurements: Height: 5\' 6"  (167.6 cm) Weight: 146 lb 9.7 oz (66.5 kg) IBW/kg (Calculated) : 63.8  Heparin Dosing Weight: 66kg Weight 66kg on 08/13/16.  Vital Signs: Temp: 98 F (36.7 C) (03/19 2000) Temp Source: Oral (03/19 2000) BP: 127/98 (03/19 2214) Pulse Rate: 105 (03/19 2210)  Labs:  Recent Labs  09/09/16 1232  09/10/16 0331 09/10/16 1252 09/10/16 1259 09/10/16 2314  HGB 11.1*  --  10.7*  --   --   --   HCT 34.5*  --  32.8*  --   --   --   PLT 140*  --  PLATELET CLUMPS NOTED ON SMEAR, UNABLE TO ESTIMATE  --   --   --   APTT  --   < > 199*  --  135* 116*  LABPROT  --   --  21.2*  --   --   --   INR  --   --  1.80  --   --   --   HEPARINUNFRC  --   --  >2.20* >2.20*  --   --   CREATININE 0.91  --  0.86  --   --   --   < > = values in this interval not displayed.  Estimated Creatinine Clearance: 52.5 mL/min (by C-G formula based on SCr of 0.86 mg/dL).   Assessment: 83yo M admitted with N/V and abdominal pain on apixaban for A.fib. Recurrent volvulus was unable to be resolved endoscopically. Pharmacy is asked to start heparin infusion while apixaban on hold.  First heparin level (anti-Xa level) high as expected given recent apixaban use.  Due to the effects of apixaban on heparin levels, using aPTT to guide therapy until levels correlate (effects of apixaban diminished).  Last dose of apixaban reported as 3/18 at 0900.    3/20 - aPTT 135 seconds, remains above goal despite reducing heparin infusion rate to 800 units/hr - Hgb low but appear stable. Plts low yesterday, were clumped on collection today. - RN noted some blood at the entry of the peripheral IV line site. Asked her to continue to monitor and contact pharmacy and MD if this worsens. -2314 aptt=116 slightly above goal, spoke with charge RN b/c pt RN in contact room, no  problems reported.    Goal of Therapy:  aPTT 66-102 sec Heparin level 0.3-0.7 units/ml Monitor platelets by anticoagulation protocol: Yes   Plan:  Reduce heparin infusion to 550 units/hr Reheck aPTT and HL in 8 hours. Check heparin level at least daily to determine when levels correlate. Check CBC daily while on heparin. F/u daily.  Dorrene German 09/11/2016 1:10 AM

## 2016-09-11 NOTE — Consult Note (Signed)
HPI:  [name] is a 83 yo male with a past medical history of  SSS, permanent Afib, GERD, prostate cancer, recurrent sigmoid volvulus, PVD, CVA, and torticollis presenting for possible surgical correction of sigmoid volvulus.   At present time, [name] appears to be lucid and aware of his current medical status. He expresses concern for his ability to keep the colostomy clean but is open if assistance is provided which suggests he has a basic understanding of his postoperative needs. He currently denies abdominal pain and states he would prefer to wait for surgery until his symptoms worsen.   PE: Gen: No apparent distress Neuro: Alert & oriented to place HEENT: Severe torticollis CV: Irregularly, irregular Pulm: Coarse rhonchi  Images/Studies: EKG: Afib  Echo 2017:  - Left ventricle: The cavity size was normal. Wall thickness was increased in a pattern of mild LVH. Systolic function was normal. The estimated ejection fraction was in the range of 50% to 55%. - Mitral valve: There was mild regurgitation. - Left atrium: The atrium was mildly dilated. - Right atrium: The atrium was mildly dilated. - Tricuspid valve: There was mild-moderate regurgitation. - Pulmonary arteries: PA peak pressure: 40 mm Hg (S).  Assessment & Plan:   [name] is a 83 yo male with a past medical history of  SSS, permanent Afib, GERD, prostate cancer, recurrent sigmoid volvulus, PVD, CVA, and torticollis presenting for operative corrective of sigmoid volvulus. Anesthesia has been consulted to evaluate [name]'s torticollis and its impact on airway management for his anticipated surgery. After evaluation, [name] will likely require a fiberoptic intubation or glidescope due to limited range of motion of his neck and mouth. An awake fiberoptic intubation or glidescope intubation could be considered but his mental status fluctuations may prove problematic. General surgery or ENT should be  present for possible tracheostomy if induction of general anesthesia is performed prior to placement of the endotracheal tube.   The complications of airway management were discussed with [name] in detail but it is unclear if he understands the risk of failed intubation including but not limited to neurologic injury and/or loss of life.    Crissie Sickles Smith Robert, MD, [facility] Anesthesiology

## 2016-09-11 NOTE — Progress Notes (Signed)
ANTICOAGULATION CONSULT NOTE - Follow Up Consult  Pharmacy Consult for heparin Indication: atrial fibrillation  No Known Allergies  Patient Measurements: Height: 5\' 6"  (167.6 cm) Weight: 146 lb 9.7 oz (66.5 kg) IBW/kg (Calculated) : 63.8  Heparin Dosing Weight: 66kg Weight 66kg on 08/13/16.  Vital Signs: Temp: 98 F (36.7 C) (03/20 0813) Temp Source: Oral (03/20 0813) BP: 150/70 (03/20 0813) Pulse Rate: 107 (03/20 0900)  Labs:  Recent Labs  09/09/16 1232  09/10/16 0331 09/10/16 1252 09/10/16 1259 09/10/16 2314 09/11/16 0403 09/11/16 0914  HGB 11.1*  --  10.7*  --   --   --  10.2*  --   HCT 34.5*  --  32.8*  --   --   --  32.2*  --   PLT 140*  --  PLATELET CLUMPS NOTED ON SMEAR, UNABLE TO ESTIMATE  --   --   --  119*  --   APTT  --   < > 199*  --  135* 116*  --  77*  LABPROT  --   --  21.2*  --   --   --   --   --   INR  --   --  1.80  --   --   --   --   --   HEPARINUNFRC  --   --  >2.20* >2.20*  --   --   --   --   CREATININE 0.91  --  0.86  --   --   --  0.81  --   < > = values in this interval not displayed.  Estimated Creatinine Clearance: 55.8 mL/min (by C-G formula based on SCr of 0.81 mg/dL).   Assessment: 83yo M admitted with N/V and abdominal pain on apixaban for A.fib. Recurrent volvulus was unable to be resolved endoscopically. Pharmacy is asked to start heparin infusion while apixaban on hold.  First heparin level (anti-Xa level) high as expected given recent apixaban use.  Due to the effects of apixaban on heparin levels, using aPTT to guide therapy until levels correlate (effects of apixaban diminished).  Last dose of apixaban reported as 3/18 at 0900.    Today, 09/11/2016: - aPTT 77 seconds, therapeutic after reducing heparin infusion rate to 550 units/hr. Heparin level pending but has been high given recent Eliquis use. Currently using aPTT levels to titrate heparin infusion. - Hgb low but appear stable. Plts low, decreased from admission. - No  bleeding documented. Surgery following for possible colostomy.   Goal of Therapy:  aPTT 66-102 sec Heparin level 0.3-0.7 units/ml Monitor platelets by anticoagulation protocol: Yes   Plan:  Continue heparin infusion at 550 units/hr Reheck aPTT in 8 hours to confirm therapeutic rate. Check heparin level at least daily to determine when levels correlate. Check CBC daily while on heparin. F/u daily.  Hershal Coria 09/11/2016 10:23 AM

## 2016-09-11 NOTE — Consult Note (Addendum)
Cardiology Consult    Patient ID: Eduardo Deleon MRN: [id number], DOB/AGE: Oct 09, 1924   Admit date: 09/09/2016 Date of Consult: 09/11/2016  Primary Physician: Merryl Hacker Not In System Primary Cardiologist: Claiborne Billings Requesting Provider: Verlon Au Reason for Consultation: Pre op  Patient Profile    83 yo male with PMH of SSS, permanent Afib, GERD, prostate Ca, sigmoid volvulus, PVD, CVA, and torticollis who presented with abd pain 2/2 recent sigmoid volvulus. Being considered for surgery.   Past Medical History   Past Medical History:  Diagnosis Date  . Anal fissure    Hx of  . Anemia   . Atrial fibrillation (HCC)    chronic anticaog - weintraub  . Congestive heart failure ([place])   . CRI (chronic renal insufficiency)   . Diverticulosis 04/12/1998   Left colon--Flex Sig  . GERD (gastroesophageal reflux disease)   . Internal hemorrhoids   . Osteoarthritis   . Prostate cancer ([place])   . PVD (peripheral vascular disease) ([place])   . Sigmoid volvulus ([place])    recurrent - 10/17, 2/18  . Splenic lesion   . Stroke [facility]) 1978 lower brain stem  . Torticollis, acquired   . Tubular adenoma   . Venous stasis dermatitis    hx venous ulcer/cellulitis 06/2011 R and 04/2011 L    Past Surgical History:  Procedure Laterality Date  . COLONOSCOPY     1988  . DIRECT LARYNGOSCOPY N/A 03/31/2014   Procedure: DIRECT LARYNGOSCOPY/EXAM UNDER ANESTHESIA;  Surgeon: [name], MD;  Location: [facility];  Service: ENT;  Laterality: N/A;  . FLEXIBLE SIGMOIDOSCOPY N/A 04/10/2016   Procedure: FLEXIBLE SIGMOIDOSCOPY;  Surgeon: Jerene Bears, MD;  Location: [facility] ENDOSCOPY;  Service: Endoscopy;  Laterality: N/A;  . FLEXIBLE SIGMOIDOSCOPY N/A 08/13/2016   Procedure: FLEXIBLE SIGMOIDOSCOPY;  Surgeon: Doran Stabler, MD;  Location: [facility] ENDOSCOPY;  Service: Gastroenterology;  Laterality: N/A;  . FLEXIBLE SIGMOIDOSCOPY N/A 09/09/2016   Procedure: FLEXIBLE SIGMOIDOSCOPY;  Surgeon: Carol Ada, MD;  Location: [facility] ENDOSCOPY;   Service: Endoscopy;  Laterality: N/A;  . HEMORRHOID SURGERY    . HERNIA REPAIR     left-1982, right - 1985  . JOINT REPLACEMENT  bilaterial knees  . LUMBAR LAMINECTOMY  Per patient  heriated disc in mid spine  . TONSILLECTOMY       Allergies  No Known Allergies  History of Present Illness    Eduardo Deleon is a 83 yo male with PMH of  SSS, permanent Afib, GERD, prostate Ca, sigmoid volvulus, PVD, CVA, and torticollis. In 2009 underwent DCCV but the converted back into Afib and has been in permanent Afib since that time. He was a former patient of Dr. Rollene Fare but now seeing Dr. Claiborne Billings. He was previous on coumadin but was transitioned to Eliquis 2.5mg  in 2015. Underwent LE dopplers in 5/16 showing a right resting ABI of 1.54 noting claudication hd left ABI 1.26. He was last seen in the office by Dr. Claiborne Billings in 12/16 and reported doing well, with plans to follow up on an as needed basis.     In 10/17 he was seen by cardiology for preop evaluatuion for possible sigmoid resection. He was deemed an acceptable risk for surgery but underwent decompression and surgery was deferred.   Last echo 10/17 with EF at 50-55% with mildly dilated LA/RA and PA peak pressure of 40.   Presented to [facility] ED on 3/18 with abd pain, no BM for 3 days and vomiting. Currently lives at [facility]. On admission labs showed  stable electrolytes, Hgb 11.1. CT abd/pelvis with recurrent distal sigmoid colon volvulus with obstruction. CXR with stable cardiomegaly. He underwent flex sig with GI with unsuccessful decompression. Surgery was consulted and initially felt that he may not be a good candidate for surgery, and patient was initially not interested in colostomy, wanted to consider options. Patient reconsidered and now entertaining the idea of colostomy if needed. Surgery requiring that GI re-evaluate another attempt at colonoscopic decompression. PCCM has also consulted in regards to pulmonary complications relating to  surgical risks.   Inpatient Medications    . Chlorhexidine Gluconate Cloth  6 each Topical Q0600  . doxazosin  2 mg Oral QHS  . finasteride  5 mg Oral Daily  . mupirocin ointment  1 application Nasal BID  . sodium chloride flush  3 mL Intravenous Q12H    Family History    Family History  Problem Relation Age of Onset  . Heart disease Father   . Breast cancer Mother   . Diabetes Brother   . Diabetes      grandmother  . Colon cancer Neg Hx     Social History    Social History   Social History  . Marital status: Married    Spouse name: N/A  . Number of children: N/A  . Years of education: N/A   Occupational History  . Not on file.   Social History Main Topics  . Smoking status: Never Smoker  . Smokeless tobacco: Never Used     Comment: married, wife in [place] since 2013  . Alcohol use No  . Drug use: No  . Sexual activity: Not on file   Other Topics Concern  . Not on file   Social History Narrative  . No narrative on file     Review of Systems    General:  No chills, fever, night sweats or weight changes.  Cardiovascular:  No chest pain, dyspnea on exertion, edema, orthopnea, palpitations, paroxysmal nocturnal dyspnea. Dermatological: No rash, lesions/masses Respiratory: No cough, dyspnea Urologic: No hematuria, dysuria Abdominal:  See HPI Neurologic:  No visual changes, wkns, changes in mental status. All other systems reviewed and are otherwise negative except as noted above.  Physical Exam    Blood pressure (!) 150/70, pulse (!) 105, temperature 98 F (36.7 C), temperature source Oral, resp. rate (!) 27, height 5\' 6"  (1.676 m), weight 146 lb 9.7 oz (66.5 kg), SpO2 97 %.  General: Pleasant frail older WM, NAD Psych: Normal affect. Neuro: Alert and oriented X 3. Moves all extremities spontaneously. HEENT: Severe Torticollis   Neck: Supple without bruits or JVD. Lungs:  Resp regular and unlabored, Course Rhonchi. Heart: Irreg Irreg no s3, s4, soft  systolic murmur. Abdomen: Soft, non-tender, non-distended, BS + x 4.  Extremities: No clubbing, cyanosis or edema. DP/PT/Radials 2+ and equal bilaterally.  Labs    Troponin (Point of Care Test) No results for input(s): TROPIPOC in the last 72 hours. No results for input(s): CKTOTAL, CKMB, TROPONINI in the last 72 hours. Lab Results  Component Value Date   WBC 4.6 09/11/2016   HGB 10.2 (L) 09/11/2016   HCT 32.2 (L) 09/11/2016   MCV 89.4 09/11/2016   PLT 119 (L) 09/11/2016    Recent Labs Lab 09/11/16 0403  NA 143  K 3.1*  CL 112*  CO2 23  BUN 19  CREATININE 0.81  CALCIUM 8.2*  PROT 5.8*  BILITOT 1.4*  ALKPHOS 69  ALT 12*  AST 23  GLUCOSE 74  No results found for: CHOL, HDL, LDLCALC, TRIG No results found for: [facility]   Radiology Studies    Dg Chest 2 View  Result Date: 09/09/2016 CLINICAL DATA:  Productive cough for the past 2 weeks. EXAM: CHEST  2 VIEW COMPARISON:  08/13/2016. FINDINGS: Stable enlarged cardiac silhouette. Mild right basilar atelectasis. Clear left lung. Aortic arch calcification. Superior migration of the right humeral head and mild right AC joint spur formation. IMPRESSION: 1. Stable cardiomegaly 2. Mild right basilar atelectasis. 3. Evidence of a large chronic rotator cuff tear on the right. Electronically Signed   By: Claudie Revering M.D.   On: 09/09/2016 13:41   Dg Chest 2 View  Result Date: 08/13/2016 CLINICAL DATA:  Abdominal pain.  Chest are clear EXAM: CHEST  2 VIEW COMPARISON:  Radiograph 03/31/2014 FINDINGS: Patient's chin overlies the RIGHT upper lobe. Aorta is ectatic. The heart is enlarged. There is bibasilar atelectasis. No focal consolidation or pleural fluid. No pneumothorax. IMPRESSION: 1. Cardiomegaly and atelectasis. 2. Chest poorly evaluated secondary to patient positioning and low lung volumes. Electronically Signed   By: Suzy Bouchard M.D.   On: 08/13/2016 17:10   Dg Abd 1 View  Result Date: 08/15/2016 CLINICAL DATA:  Sigmoid  volvulus. EXAM: ABDOMEN - 1 VIEW COMPARISON:  CT abdomen and pelvis 08/13/2016. Plain films 08/14/2016. FINDINGS: Moderate gaseous distention in the mid abdomen representing sigmoid colon appears slightly improved. There is less verticality of the sigmoid colon although it remains disproportionally distended relative to the other colonic loops. Continued surveillance is warranted. IMPRESSION: Slight improvement. Electronically Signed   By: Staci Righter M.D.   On: 08/15/2016 07:58   Dg Abd 1 View  Result Date: 08/14/2016 CLINICAL DATA:  Abdominal pain for several days, moderate abdominal distention EXAM: ABDOMEN - 1 VIEW COMPARISON:  CT abdomen pelvis of 08/13/2016 FINDINGS: There is a slightly gaseous distended loop of what appears to be sigmoid colon in the mid pelvis. This could represent a developing sigmoid volvulus which the patient apparently has had previously. No other bowel distention is seen. The bones are somewhat osteopenic. IMPRESSION: Some gaseous distention of what appears to be sigmoid colon in the mid pelvis may indicate early development of sigmoid volvulus which the patient has reportedly had previously. Electronically Signed   By: Ivar Drape M.D.   On: 08/14/2016 09:15   Ct Abdomen Pelvis W Contrast  Result Date: 09/09/2016 CLINICAL DATA:  Low abdominal pain. EXAM: CT ABDOMEN AND PELVIS WITH CONTRAST TECHNIQUE: Multidetector CT imaging of the abdomen and pelvis was performed using the standard protocol following bolus administration of intravenous contrast. CONTRAST:  ISOVUE-300 IOPAMIDOL (ISOVUE-300) INJECTION 61% COMPARISON:  CT abdomen pelvis 04/10/2016 FINDINGS: Lower chest: Bibasilar atelectasis, left greater than right. Hepatobiliary: No focal liver abnormality is seen. No gallstones, gallbladder wall thickening, or biliary dilatation. Pancreas:  Atrophic pancreas. 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  Adrenals/Urinary Tract: Adrenal glands are unremarkable. Kidneys are normal, without renal calculi, focal lesion, or hydronephrosis. Bladder is unremarkable. Stable cysts in the left inferior renal pole. Stomach/Bowel: The stomach and small bowel and decompressed. There is recurrent volvulus of the distal portion of the sigmoid colon, resulting in dilation of the upstream colonic loops. The transverse caliber of the dilated sigmoid colon reaches 9.2 cm. Small amount of fluid stool is seen proximal to the level of the volvulus. Vascular/Lymphatic: Aortic atherosclerosis. No enlarged abdominal or pelvic lymph nodes. Reproductive: The prostate gland is mildly  enlarged. Other: Fluid containing right indirect inguinal hernia noted. Musculoskeletal: Multilevel degenerative disc disease ; S shaped thoraco lumbar spine scoliosis. IMPRESSION: Recurrent distal sigmoid colon volvulus resulting in upstream colonic obstruction. The small bowel is decompressed. These results were called by telephone at the time of interpretation on 09/09/2016 at 3:35 pm to [facility] [place] , who verbally acknowledged these results. Electronically Signed   By: Fidela Salisbury M.D.   On: 09/09/2016 15:37   Ct Abdomen Pelvis W Contrast  Result Date: 08/13/2016 CLINICAL DATA:  Lower abdominal pain. EXAM: CT ABDOMEN AND PELVIS WITH CONTRAST TECHNIQUE: Multidetector CT imaging of the abdomen and pelvis was performed using the standard protocol following bolus administration of intravenous contrast. CONTRAST:  80 mL Isovue-300 COMPARISON:  04/10/2016 FINDINGS: Lower chest: Subsegmental atelectasis in the lung bases. No pleural effusion. Right atrial enlargement. Hepatobiliary: No focal liver abnormality is seen. No gallstones, gallbladder wall thickening, or biliary dilatation. Pancreas: Diffuse fatty infiltration as previously seen. Spleen: Similar appearance of approximately 7 cm heterogeneously enhancing mass inferiorly in the spleen which  is likely benign since it was also present in 2009 and may represent a hemangioma. Adrenals/Urinary Tract: Unremarkable adrenal glands. No evidence of renal calculi or hydronephrosis. Unchanged hypodense lesions in the left kidney which likely represent cysts and measure up to 2.7 cm in the lower pole. Unremarkable bladder. Stomach/Bowel: The stomach is within normal limits. There is marked gaseous distension of redundant mid to distal sigmoid colon which measures up to 11 cm in diameter. There is a swirled appearance of the mesentery at the distal aspect of the dilated sigmoid loop with abrupt transition to decompressed distal sigmoid and rectum. A moderate amount of stool is present more proximally in the colon. There is no small bowel dilatation. Vascular/Lymphatic: Abdominal aortic atherosclerosis without aneurysm. No enlarged lymph nodes. Reproductive: Unremarkable prostate for age. Other: No intraperitoneal free fluid or free air. No abdominal wall mass or hernia. Musculoskeletal: Thoracolumbar scoliosis with advanced diffuse disc and facet degeneration. IMPRESSION: Recurrent sigmoid volvulus. Critical Value/emergent results were called by telephone at the time of interpretation on 08/13/2016 at 5:09 pm to Dr. Leo Grosser , who verbally acknowledged these results. Electronically Signed   By: Logan Bores M.D.   On: 08/13/2016 17:10   Dg Abd Portable 1v  Result Date: 09/11/2016 CLINICAL DATA:  83 year old male with abdominal distention. Subsequent encounter. EXAM: PORTABLE ABDOMEN - 1 VIEW COMPARISON:  09/10/2016 plain film exam.  09/09/2016 CT. FINDINGS: Persistent abnormal bowel gas pattern with gas distended (measuring up to 9 cm as on prior exam) superiorly extending sigmoid colon which may reflect changes of sigmoid volvulus. The possibility of free intraperitoneal air cannot be assessed on a supine view. Scoliosis convex left.  Hip degenerative change greater on left. IMPRESSION: Findings consistent  with sigmoid volvulus similar to prior exam as noted above. Electronically Signed   By: Genia Del M.D.   On: 09/11/2016 09:46   Dg Abd Portable 1v  Result Date: 09/10/2016 CLINICAL DATA:  Sigmoid volvulus EXAM: PORTABLE ABDOMEN - 1 VIEW COMPARISON:  09/09/2016 FINDINGS: Persistent gaseous distension of the sigmoid colon with paucity of bowel gas within pelvis highly suspicious for recurrent sigmoid colon volvulus. IMPRESSION: Persistent gaseous distension of the sigmoid colon with paucity of bowel gas within pelvis highly suspicious for recurrent sigmoid colon volvulus. Electronically Signed   By: Lahoma Crocker M.D.   On: 09/10/2016 17:25    ECG & Cardiac Imaging    EKG: Afib, no acute ST/T  wave changes  Echo: 10/17  Study Conclusions  - Left ventricle: The cavity size was normal. Wall thickness was   increased in a pattern of mild LVH. Systolic function was normal.   The estimated ejection fraction was in the range of 50% to 55%.   Doppler parameters are consistent with both elevated ventricular   end-diastolic filling pressure and elevated left atrial filling   pressure. - Mitral valve: There was mild regurgitation. - Left atrium: The atrium was mildly dilated. - Right atrium: The atrium was mildly dilated. - Tricuspid valve: There was mild-moderate regurgitation. - Pulmonary arteries: PA peak pressure: 40 mm Hg (S).  Assessment & Plan    83 yo male with PMH of SSS, permanent Afib, GERD, prostate Ca, sigmoid volvulus, PVD, CVA, and torticollis who presented with abd pain 2/2 recent sigmoid volvulus. Being considered for surgery.   Pre op Evaluation: He denies any chest pain or dyspnea prior to admission. Last echo 10/17 showed low normal EF. CXR this admission showed stable cardiomegaly with mild right basilar atelectasis. EKG showed known Afib. Eliquis has been held this admission and started on IV heparin for possible invasive procedures. Does not appear to be volume overloaded  on exam. He is at least moderate if not high risk for surgical intervention, though seems equally high risk if volvulus/obstruction is not corrected. Surgery asking for GI to reassess for possible re-attempt at decompression. MD to see.   Barnet Pall, NP-C Pager [phone] 09/11/2016, 12:13 PM   I have examined the patient and reviewed assessment and plan and discussed with patient.  Agree with above as stated.  Rate control AFib.  He does not appear to be a good candidate for longterm anticoagulation.    In regards to perioperative risk, he is at least moderate risk for a cardiac event due to his age and overall debilitated state.  Unable to get much useful history.  He thinks he is in a motel and was asking my help to get him back to his room.  He denied chest pain.  He does not have obvious volume overload. He has severe torticollis on exam.  No cardiac interventions are available that would lower his risk for general anesthesia.   Last stress was in 2010 and was normal.   Larae Grooms

## 2016-09-11 NOTE — Progress Notes (Signed)
2 Days Post-Op  Subjective: Pt denies abd pain; reports large BM this am. Wants to eat  Objective: Vital signs in last 24 hours: Temp:  [98 F (36.7 C)-98.6 F (37 C)] 98 F (36.7 C) (03/20 0813) Pulse Rate:  [80-105] 95 (03/20 0813) Resp:  [17-27] 24 (03/20 0813) BP: (112-151)/(68-98) 150/70 (03/20 0813) SpO2:  [87 %-99 %] 99 % (03/20 0813) Last BM Date:  (PTA, pt unsure)  Intake/Output from previous day: 03/19 0701 - 03/20 0700 In: 1904.2 [I.V.:1904.2] Out: 331 [Urine:329; Stool:2] Intake/Output this shift: No intake/output data recorded.  Upper resp congestion, not ill appearing Soft, some distension (less than yesterday), no guarding/rigidity/peritonitis irreg  Lab Results:   Recent Labs  09/10/16 0331 09/11/16 0403  WBC 6.8 4.6  HGB 10.7* 10.2*  HCT 32.8* 32.2*  PLT PLATELET CLUMPS NOTED ON SMEAR, UNABLE TO ESTIMATE 119*   BMET  Recent Labs  09/10/16 0331 09/11/16 0403  NA 141 143  K 3.5 3.1*  CL 108 112*  CO2 27 23  GLUCOSE 98 74  BUN 20 19  CREATININE 0.86 0.81  CALCIUM 8.2* 8.2*   PT/INR  Recent Labs  09/10/16 0331  LABPROT 21.2*  INR 1.80   ABG No results for input(s): PHART, HCO3 in the last 72 hours.  Invalid input(s): PCO2, PO2  Studies/Results: Dg Chest 2 View  Result Date: 09/09/2016 CLINICAL DATA:  Productive cough for the past 2 weeks. EXAM: CHEST  2 VIEW COMPARISON:  08/13/2016. FINDINGS: Stable enlarged cardiac silhouette. Mild right basilar atelectasis. Clear left lung. Aortic arch calcification. Superior migration of the right humeral head and mild right AC joint spur formation. IMPRESSION: 1. Stable cardiomegaly 2. Mild right basilar atelectasis. 3. Evidence of a large chronic rotator cuff tear on the right. Electronically Signed   By: Claudie Revering M.D.   On: 09/09/2016 13:41   Ct Abdomen Pelvis W Contrast  Result Date: 09/09/2016 CLINICAL DATA:  Low abdominal pain. EXAM: CT ABDOMEN AND PELVIS WITH CONTRAST TECHNIQUE:  Multidetector CT imaging of the abdomen and pelvis was performed using the standard protocol following bolus administration of intravenous contrast. CONTRAST:  ISOVUE-300 IOPAMIDOL (ISOVUE-300) INJECTION 61% COMPARISON:  CT abdomen pelvis 04/10/2016 FINDINGS: Lower chest: Bibasilar atelectasis, left greater than right. Hepatobiliary: No focal liver abnormality is seen. No gallstones, gallbladder wall thickening, or biliary dilatation. Pancreas:  Atrophic pancreas. 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 Adrenals/Urinary Tract: Adrenal glands are unremarkable. Kidneys are normal, without renal calculi, focal lesion, or hydronephrosis. Bladder is unremarkable. Stable cysts in the left inferior renal pole. Stomach/Bowel: The stomach and small bowel and decompressed. There is recurrent volvulus of the distal portion of the sigmoid colon, resulting in dilation of the upstream colonic loops. The transverse caliber of the dilated sigmoid colon reaches 9.2 cm. Small amount of fluid stool is seen proximal to the level of the volvulus. Vascular/Lymphatic: Aortic atherosclerosis. No enlarged abdominal or pelvic lymph nodes. Reproductive: The prostate gland is mildly enlarged. Other: Fluid containing right indirect inguinal hernia noted. Musculoskeletal: Multilevel degenerative disc disease ; S shaped thoraco lumbar spine scoliosis. IMPRESSION: Recurrent distal sigmoid colon volvulus resulting in upstream colonic obstruction. The small bowel is decompressed. These results were called by telephone at the time of interpretation on 09/09/2016 at 3:35 pm to [facility] [place] , who verbally acknowledged these results. Electronically Signed   By: Fidela Salisbury M.D.   On: 09/09/2016 15:37   Dg Abd Portable 1v  Result Date: 09/10/2016 CLINICAL DATA:  Sigmoid volvulus EXAM: PORTABLE ABDOMEN - 1 VIEW COMPARISON:  09/09/2016 FINDINGS: Persistent gaseous  distension of the sigmoid colon with paucity of bowel gas within pelvis highly suspicious for recurrent sigmoid colon volvulus. IMPRESSION: Persistent gaseous distension of the sigmoid colon with paucity of bowel gas within pelvis highly suspicious for recurrent sigmoid colon volvulus. Electronically Signed   By: Lahoma Crocker M.D.   On: 09/10/2016 17:25    Anti-infectives: Anti-infectives    None      Assessment/Plan: Recurrent sigmoid volvulus. Atrial fibrillation on Eliquis/last dose 08/13/16 Severe hearing deficit Hx of CVA History of congestive heart failure History of chronic renal insufficiency Severe torticollis History of prostate cancer - slow growing Osteoarthritis GERD DNR  Again spoke with pt regarding surgical mgmt which would require permanent colostomy. He now states that he would entertain a colostomy if needed   Check plain film this am If volvulus still present - will proceed with anesthesia, cardiac, pulmonary consults to help gauge operative risk as well as ability to be extubated. Would also ask GI to re-evaluate for another attempt at colonoscopic decompression.   If overall opinion is that he is high risk, likely prolonged intubation, etc then he and I will have to discuss if would like to proceed with those odds/likely occurrences. At that point I think a palliative care consult would be good to help determine his wishes. He indicates his only family is his equally elderly wife who share a room at nursing facility. Reports some distant kin in [place].   [name]. [name], MD, FACS General, Bariatric, & Minimally Invasive Surgery [facility], [place]    LOS: 2 days    Gayland Curry 09/11/2016

## 2016-09-11 NOTE — Progress Notes (Signed)
ANTICOAGULATION CONSULT NOTE - Follow Up Consult  Pharmacy Consult for heparin Indication: atrial fibrillation  No Known Allergies  Patient Measurements: Height: 5\' 6"  (167.6 cm) Weight: 146 lb 9.7 oz (66.5 kg) IBW/kg (Calculated) : 63.8  Heparin Dosing Weight: 66kg Weight 66kg on 08/13/16.  Vital Signs: Temp: 97.7 F (36.5 C) (03/20 1600) Temp Source: Oral (03/20 1600) BP: 138/67 (03/20 1600) Pulse Rate: 120 (03/20 1900)  Labs:  Recent Labs  09/09/16 1232  09/10/16 0331 09/10/16 1252  09/10/16 2314 09/11/16 0403 09/11/16 0914 09/11/16 1820  HGB 11.1*  --  10.7*  --   --   --  10.2*  --   --   HCT 34.5*  --  32.8*  --   --   --  32.2*  --   --   PLT 140*  --  PLATELET CLUMPS NOTED ON SMEAR, UNABLE TO ESTIMATE  --   --   --  119*  --   --   APTT  --   < > 199*  --   < > 116*  --  77* 64*  LABPROT  --   --  21.2*  --   --   --   --   --   --   INR  --   --  1.80  --   --   --   --   --   --   HEPARINUNFRC  --   --  >2.20* >2.20*  --   --   --  1.76*  --   CREATININE 0.91  --  0.86  --   --   --  0.81  --   --   < > = values in this interval not displayed.  Estimated Creatinine Clearance: 55.8 mL/min (by C-G formula based on SCr of 0.81 mg/dL).   Assessment: 83yo M admitted with N/V and abdominal pain on apixaban for A.fib. Recurrent volvulus was unable to be resolved endoscopically. Pharmacy is asked to start heparin infusion while apixaban on hold.  First heparin level (anti-Xa level) high as expected given recent apixaban use.  Due to the effects of apixaban on heparin levels, using aPTT to guide therapy until levels correlate (effects of apixaban diminished).  Last dose of apixaban reported as 3/18 at 0900.    Today, 09/11/2016: - aPTT 64 sec upon recheck with Heparin continuing @ 550 units/hr. Currently using aPTT levels to titrate heparin infusion. - No bleeding documented. Surgery following for possible colostomy.   Goal of Therapy:  aPTT 66-102 sec Heparin  level 0.3-0.7 units/ml Monitor platelets by anticoagulation protocol: Yes   Plan:  Increase heparin infusion to 600 units/hr Reheck aPTT in 8 hours to confirm therapeutic rate. Check heparin level at least daily to determine when levels correlate. Check CBC daily while on heparin. F/u daily.  Everette Rank, PharmD 09/11/2016 7:22 PM

## 2016-09-11 NOTE — Consult Note (Signed)
Name: Eduardo Deleon MRN: [id number] DOB: 03/21/1925    ADMISSION DATE:  09/09/2016 CONSULTATION DATE: 3/20  REFERRING MD : [name]   CHIEF COMPLAINT:  Pre-op pulmonary/critical care risk assessment   BRIEF PATIENT DESCRIPTION:  This is a 83 year old male who resides at a SNF. He has a sig medical h/o AF, prior CVA, severe torticollis, mild dementia, BPH w/ slow growing prostate cancer, Chronic diastolic HF, chronic aspiration, CKD stage I and non-healing left ankle ulcer. Presented to ER 3/18 w/ abd pain and vomiting. Found to have recurrent sigmoid volvulus. He was seen by GI medicine w/ unsuccessful decompression attempt & it was felt that it could not be flipped endoscopically. He was seen by surgery who feels that surgical management would require permanent colostomy. Given his multiple medical co-morbids PCCM was asked to see and comment on pulmonary risk.    SIGNIFICANT EVENTS    STUDIES:  3/18 CT abd: Recurrent distal sigmoid colon volvulus resulting in upstream colonic obstruction.  HISTORY OF PRESENT ILLNESS:  See above  PAST MEDICAL HISTORY :   has a past medical history of Anal fissure; Anemia; Atrial fibrillation ([place]); Congestive heart failure ([place]); CRI (chronic renal insufficiency); Diverticulosis (04/12/1998); GERD (gastroesophageal reflux disease); Internal hemorrhoids; Osteoarthritis; Prostate cancer ([place]); PVD (peripheral vascular disease) ([place]); Sigmoid volvulus ([place]); Splenic lesion; Stroke [facility]) (1978 lower brain stem); Torticollis, acquired; Tubular adenoma; and Venous stasis dermatitis.  has a past surgical history that includes Joint replacement (bilaterial knees); Lumbar laminectomy (Per patient  heriated disc in mid spine); Hernia repair; Hemorrhoid surgery; Tonsillectomy; Colonoscopy; Direct laryngoscopy (N/A, 03/31/2014); Flexible sigmoidoscopy (N/A, 04/10/2016); Flexible sigmoidoscopy (N/A, 08/13/2016); and Flexible sigmoidoscopy (N/A, 09/09/2016). Prior to  Admission medications   Medication Sig Start Date End Date Taking? Authorizing Provider  acetaminophen (TYLENOL) 325 MG tablet Take 650 mg by mouth every 6 (six) hours as needed for moderate pain.   Yes Historical Provider, MD  apixaban (ELIQUIS) 5 MG TABS tablet Take 1 tablet (5 mg total) by mouth 2 (two) times daily. 04/12/16  Yes Barton Dubois, MD  cholecalciferol (VITAMIN D) 1000 UNITS tablet Take 1,000 Units by mouth daily.    Yes Historical Provider, MD  diltiazem (CARDIZEM CD) 180 MG 24 hr capsule Take 1 capsule (180 mg total) by mouth daily. 04/07/13  Yes Lorretta Harp, MD  doxazosin (CARDURA) 2 MG tablet Take 2 mg by mouth at bedtime.     Yes Historical Provider, MD  finasteride (PROSCAR) 5 MG tablet Take 5 mg by mouth daily.   Yes Historical Provider, MD  furosemide (LASIX) 20 MG tablet Take 20 mg by mouth daily.   Yes Historical Provider, MD  iron polysaccharides (FERREX 150) 150 MG capsule Take 1 capsule (150 mg total) by mouth daily. 09/25/13  Yes Troy Sine, MD  magnesium oxide (MAG-OX) 400 MG tablet Take 400 mg by mouth daily.   Yes Historical Provider, MD  Multiple Vitamins-Minerals (PRESERVISION AREDS 2 PO) Take 1 capsule by mouth 2 (two) times daily.   Yes Historical Provider, MD  pantoprazole (PROTONIX) 40 MG tablet Take 1 tablet (40 mg total) by mouth daily. 04/13/14  Yes Charlynne Cousins, MD  polyethylene glycol [facility] Floria Raveling) packet Take 17 g by mouth daily. 04/12/16  Yes Barton Dubois, MD  potassium chloride (KLOR-CON) 10 MEQ CR tablet Take 10 mEq by mouth daily.    Yes Historical Provider, MD   No Known Allergies  FAMILY HISTORY:  family history includes Breast cancer in his  mother; Diabetes in his brother; Heart disease in his father. SOCIAL HISTORY:  reports that he has never smoked. He has never used smokeless tobacco. He reports that he does not drink alcohol or use drugs.  REVIEW OF SYSTEMS:   Poor historian and very hard of hearing  SUBJECTIVE:    VITAL SIGNS: Temp:  [98 F (36.7 C)-98.6 F (37 C)] 98 F (36.7 C) (03/20 0813) Pulse Rate:  [80-107] 105 (03/20 1000) Resp:  [17-27] 27 (03/20 1000) BP: (112-151)/(69-98) 150/70 (03/20 0813) SpO2:  [87 %-99 %] 97 % (03/20 1000)  PHYSICAL EXAMINATION: General:  Frail elderly male, intermittently in acute discomfort d/t abd pain  Neuro:  Very hard of hearing. Oriented X 3. Moves all ext  HEENT:  Severe torticollis MMM, food particles in mouth  Cardiovascular:  Regular irreg  Lungs:  Diffuse rhonchi no accessory use  Abdomen:  Distended. Not painful to palp + bowels sounds  Musculoskeletal:  weak Skin:  Dry and intact w/ scattered areas of ecchymosis    Recent Labs Lab 09/09/16 1232 09/10/16 0331 09/11/16 0403  NA 142 141 143  K 3.8 3.5 3.1*  CL 110 108 112*  CO2 27 27 23   BUN 24* 20 19  CREATININE 0.91 0.86 0.81  GLUCOSE 126* 98 74    Recent Labs Lab 09/09/16 1232 09/10/16 0331 09/11/16 0403  HGB 11.1* 10.7* 10.2*  HCT 34.5* 32.8* 32.2*  WBC 9.0 6.8 4.6  PLT 140* PLATELET CLUMPS NOTED ON SMEAR, UNABLE TO ESTIMATE 119*   Dg Chest 2 View  Result Date: 09/09/2016 CLINICAL DATA:  Productive cough for the past 2 weeks. EXAM: CHEST  2 VIEW COMPARISON:  08/13/2016. FINDINGS: Stable enlarged cardiac silhouette. Mild right basilar atelectasis. Clear left lung. Aortic arch calcification. Superior migration of the right humeral head and mild right AC joint spur formation. IMPRESSION: 1. Stable cardiomegaly 2. Mild right basilar atelectasis. 3. Evidence of a large chronic rotator cuff tear on the right. Electronically Signed   By: Claudie Revering M.D.   On: 09/09/2016 13:41   Ct Abdomen Pelvis W Contrast  Result Date: 09/09/2016 CLINICAL DATA:  Low abdominal pain. EXAM: CT ABDOMEN AND PELVIS WITH CONTRAST TECHNIQUE: Multidetector CT imaging of the abdomen and pelvis was performed using the standard protocol following bolus administration of intravenous contrast. CONTRAST:   ISOVUE-300 IOPAMIDOL (ISOVUE-300) INJECTION 61% COMPARISON:  CT abdomen pelvis 04/10/2016 FINDINGS: Lower chest: Bibasilar atelectasis, left greater than right. Hepatobiliary: No focal liver abnormality is seen. No gallstones, gallbladder wall thickening, or biliary dilatation. Pancreas:  Atrophic pancreas. 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 Adrenals/Urinary Tract: Adrenal glands are unremarkable. Kidneys are normal, without renal calculi, focal lesion, or hydronephrosis. Bladder is unremarkable. Stable cysts in the left inferior renal pole. Stomach/Bowel: The stomach and small bowel and decompressed. There is recurrent volvulus of the distal portion of the sigmoid colon, resulting in dilation of the upstream colonic loops. The transverse caliber of the dilated sigmoid colon reaches 9.2 cm. Small amount of fluid stool is seen proximal to the level of the volvulus. Vascular/Lymphatic: Aortic atherosclerosis. No enlarged abdominal or pelvic lymph nodes. Reproductive: The prostate gland is mildly enlarged. Other: Fluid containing right indirect inguinal hernia noted. Musculoskeletal: Multilevel degenerative disc disease ; S shaped thoraco lumbar spine scoliosis. IMPRESSION: Recurrent distal sigmoid colon volvulus resulting in upstream colonic obstruction. The small bowel is decompressed. These results were called by telephone at the time of interpretation on  09/09/2016 at 3:35 pm to [facility] , who verbally acknowledged these results. Electronically Signed   By: Fidela Salisbury M.D.   On: 09/09/2016 15:37   Dg Abd Portable 1v  Result Date: 09/11/2016 CLINICAL DATA:  83 year old male with abdominal distention. Subsequent encounter. EXAM: PORTABLE ABDOMEN - 1 VIEW COMPARISON:  09/10/2016 plain film exam.  09/09/2016 CT. FINDINGS: Persistent abnormal bowel gas pattern with gas distended (measuring up to 9 cm as on prior exam)  superiorly extending sigmoid colon which may reflect changes of sigmoid volvulus. The possibility of free intraperitoneal air cannot be assessed on a supine view. Scoliosis convex left.  Hip degenerative change greater on left. IMPRESSION: Findings consistent with sigmoid volvulus similar to prior exam as noted above. Electronically Signed   By: Genia Del M.D.   On: 09/11/2016 09:46   Dg Abd Portable 1v  Result Date: 09/10/2016 CLINICAL DATA:  Sigmoid volvulus EXAM: PORTABLE ABDOMEN - 1 VIEW COMPARISON:  09/09/2016 FINDINGS: Persistent gaseous distension of the sigmoid colon with paucity of bowel gas within pelvis highly suspicious for recurrent sigmoid colon volvulus. IMPRESSION: Persistent gaseous distension of the sigmoid colon with paucity of bowel gas within pelvis highly suspicious for recurrent sigmoid colon volvulus. Electronically Signed   By: Lahoma Crocker M.D.   On: 09/10/2016 17:25    ASSESSMENT / PLAN:  Sigmoid volvulus  Difficult airway in setting of severe torticollis Chronic dysphagia  GERD h/o CVA Deconditioning CAF CRI stage I h/o prostate cancer  Anemia of chronic disease.   Discussion   He has h/o chronic dysphagia, GERD, and severe torticollis. He is 83 years old and bed/chair bound at baseline so his pulmonary risk for prolonged ventilatory need, hypoxic event and or death d/t difficult airway would all be concerning and moderate to high risk for pulmonary complications however his pulmonary risk is NOT a contraindication to surgery. Two specific points come to mind when assessing Mr Pingleton's risk: 1) he was intubated by anesthesia for epiglottitis and HCRF back in 2015. He was on the vent for ~ 48hrs and successfully extubated. Certainly he is likely weaker than he was then but at least we know his airway can be managed. 2) I think his risk of pulmonary complications are probably close to if not equally as high if we do not correct his volvulus/ bowel obstruction due  to his risk of aspiration and subsequent respiratory failure as a result.   Recommendation -He is high risk for pulmonary complications from surgery: difficult airway, hypoxic event, cardiac arrest, prolonged ventilation however these are NOT absolute contraindications. Without surgical intervention (if deemed necessary) the risk for aspiration and respiratory failure would be close to if not equal so cleared from pulmonary stand-point for surgery.  We will be available as needed should the anesthesia team have difficulty extubating Mr Axel Filler in the [place] ACNP-BC [place] Pager # [phone] OR # [phone] if no answer    Chronic asp 09/11/2016, 10:56 AM

## 2016-09-12 ENCOUNTER — Inpatient Hospital Stay (HOSPITAL_COMMUNITY): Payer: Medicare Other

## 2016-09-12 DIAGNOSIS — G309 Alzheimer's disease, unspecified: Secondary | ICD-10-CM

## 2016-09-12 DIAGNOSIS — F0280 Dementia in other diseases classified elsewhere without behavioral disturbance: Secondary | ICD-10-CM

## 2016-09-12 DIAGNOSIS — Z7901 Long term (current) use of anticoagulants: Secondary | ICD-10-CM

## 2016-09-12 DIAGNOSIS — M436 Torticollis: Secondary | ICD-10-CM

## 2016-09-12 LAB — BASIC METABOLIC PANEL
Anion gap: 11 (ref 5–15)
BUN: 17 mg/dL (ref 6–20)
CALCIUM: 8.4 mg/dL — AB (ref 8.9–10.3)
CHLORIDE: 115 mmol/L — AB (ref 101–111)
CO2: 19 mmol/L — AB (ref 22–32)
CREATININE: 0.77 mg/dL (ref 0.61–1.24)
GFR calc Af Amer: >60 mL/min (ref >60)
GFR calc non Af Amer: >60 mL/min (ref >60)
GLUCOSE: 76 mg/dL (ref 65–99)
Potassium: 3.9 mmol/L (ref 3.5–5.1)
Sodium: 145 mmol/L (ref 135–145)

## 2016-09-12 LAB — CBC
HCT: 32.7 % — ABNORMAL LOW (ref 39.0–52.0)
Hemoglobin: 10.5 g/dL — ABNORMAL LOW (ref 13.0–17.0)
MCH: 29.5 pg (ref 26.0–34.0)
MCHC: 32.1 g/dL (ref 30.0–36.0)
MCV: 91.9 fL (ref 78.0–100.0)
PLATELETS: 118 10*3/uL — AB (ref 150–400)
RBC: 3.56 MIL/uL — ABNORMAL LOW (ref 4.22–5.81)
RDW: 17.8 % — AB (ref 11.5–15.5)
WBC: 4.8 10*3/uL (ref 4.0–10.5)

## 2016-09-12 LAB — HEPARIN LEVEL (UNFRACTIONATED): HEPARIN UNFRACTIONATED: 1.52 [IU]/mL — AB (ref 0.30–0.70)

## 2016-09-12 LAB — APTT
APTT: 63 s — AB (ref 24–36)
aPTT: 81 seconds — ABNORMAL HIGH (ref 24–36)

## 2016-09-12 LAB — MAGNESIUM: Magnesium: 2.1 mg/dL (ref 1.7–2.4)

## 2016-09-12 LAB — PREALBUMIN: PREALBUMIN: 10.2 mg/dL — AB (ref 18–38)

## 2016-09-12 MED ORDER — LORAZEPAM 2 MG/ML IJ SOLN
0.5000 mg | Freq: Once | INTRAMUSCULAR | Status: AC
  Administered 2016-09-12: 0.5 mg via INTRAVENOUS
  Filled 2016-09-12: qty 1

## 2016-09-12 MED ORDER — CEFOTETAN DISODIUM-DEXTROSE 2-2.08 GM-% IV SOLR
2.0000 g | INTRAVENOUS | Status: AC
Start: 2016-09-13 — End: 2016-09-13
  Administered 2016-09-13: 2 g via INTRAVENOUS

## 2016-09-12 MED ORDER — DILTIAZEM HCL-DEXTROSE 100-5 MG/100ML-% IV SOLN (PREMIX)
5.0000 mg/h | INTRAVENOUS | Status: DC
  Administered 2016-09-12 – 2016-09-13 (×2): 5 mg/h via INTRAVENOUS
  Administered 2016-09-14: 7.5 mg/h via INTRAVENOUS
  Administered 2016-09-14: 5 mg/h via INTRAVENOUS
  Administered 2016-09-15: 10 mg/h via INTRAVENOUS
  Administered 2016-09-15: 15 mg/h via INTRAVENOUS
  Administered 2016-09-15: 5 mg/h via INTRAVENOUS
  Administered 2016-09-16: 10 mg/h via INTRAVENOUS
  Administered 2016-09-17 – 2016-09-18 (×2): 5 mg/h via INTRAVENOUS
  Filled 2016-09-12 (×10): qty 100

## 2016-09-12 MED ORDER — HALOPERIDOL LACTATE 5 MG/ML IJ SOLN
2.0000 mg | Freq: Once | INTRAMUSCULAR | Status: AC
  Administered 2016-09-12: 2 mg via INTRAVENOUS
  Filled 2016-09-12: qty 1

## 2016-09-12 MED ORDER — HEPARIN (PORCINE) IN NACL 100-0.45 UNIT/ML-% IJ SOLN: 700.0000 [IU]/h | INTRAMUSCULAR | Status: AC

## 2016-09-12 NOTE — Progress Notes (Signed)
ANTICOAGULATION CONSULT NOTE - Follow Up Consult  Pharmacy Consult for heparin Indication: atrial fibrillation  No Known Allergies  Patient Measurements: Height: 5\' 6"  (167.6 cm) Weight: 156 lb 1.4 oz (70.8 kg) IBW/kg (Calculated) : 63.8  Heparin Dosing Weight: 66kg Weight 66kg on 08/13/16.  Vital Signs: Temp: 97.6 F (36.4 C) (03/21 0643) Temp Source: Oral (03/21 0643) BP: 132/93 (03/21 0643) Pulse Rate: 83 (03/21 0643)  Labs:  Recent Labs  09/09/16 1232  09/10/16 0331 09/10/16 1252  09/11/16 0403 09/11/16 0914 09/11/16 1820 09/12/16 0506  HGB 11.1*  --  10.7*  --   --  10.2*  --   --  10.5*  HCT 34.5*  --  32.8*  --   --  32.2*  --   --  32.7*  PLT 140*  --  PLATELET CLUMPS NOTED ON SMEAR, UNABLE TO ESTIMATE  --   --  119*  --   --  118*  APTT  --   < > 199*  --   < >  --  77* 64* 63*  LABPROT  --   --  21.2*  --   --   --   --   --   --   INR  --   --  1.80  --   --   --   --   --   --   HEPARINUNFRC  --   --  >2.20* >2.20*  --   --  1.76*  --   --   CREATININE 0.91  --  0.86  --   --  0.81  --   --   --   < > = values in this interval not displayed.  Estimated Creatinine Clearance: 55.8 mL/min (by C-G formula based on SCr of 0.81 mg/dL).   Assessment: 83yo M admitted with N/V and abdominal pain on apixaban for A.fib. Recurrent volvulus was unable to be resolved endoscopically. Pharmacy is asked to start heparin infusion while apixaban on hold.  First heparin level (anti-Xa level) high as expected given recent apixaban use.  Due to the effects of apixaban on heparin levels, using aPTT to guide therapy until levels correlate (effects of apixaban diminished).  Last dose of apixaban reported as 3/18 at 0900.    3/20 - aPTT 64 sec upon recheck with Heparin continuing @ 550 units/hr. Currently using aPTT levels to titrate heparin infusion. - No bleeding documented. Surgery following for possible colostomy. Today, 3/21 -0506 aPTT = 63 sec and HL= 1.52, no problems  reported   Goal of Therapy:  aPTT 66-102 sec Heparin level 0.3-0.7 units/ml Monitor platelets by anticoagulation protocol: Yes   Plan:  Increase heparin infusion to 700 units/hr Reheck aPTT in 8 hours Check heparin level at least daily to determine when levels correlate. Check CBC daily while on heparin. F/u daily.  Dorrene German, PharmD 09/12/2016 6:58 AM

## 2016-09-12 NOTE — Progress Notes (Signed)
Pt did not arrive on unit with Cardizem infusion. HR 93 at this time. BP 131/84 upon arrival. Awaiting clarification from MD in the morning before starting infusion.

## 2016-09-12 NOTE — Consult Note (Signed)
Consultation Note Date: 09/12/2016   Patient Name: Eduardo Deleon  DOB: Feb 19, 1925  MRN: [id number]  Age / Sex: 83 y.o., male  PCP: Pcp Not In System Referring Physician: Annita Brod, MD  Reason for Consultation: Establishing goals of care  HPI/Patient Profile: 83 y.o. male   admitted on 09/09/2016  Life limiting illness: recurrent sigmoid volvulus.  83 Y/O SNF resident at [facility], H/O afib, cva, torticollis, dementia, BPH, prostate cancer, diastolic heart failure, CKD admitted with volvulus, had an unsuccessful decompression by GI. He is being considered for surgery with resection and colostomy.  Clinical Assessment and Goals of Care:  patient is an elderly gentleman resting in bed, he is to undergo abdominal X ray soon. He is hard of hearing, he is some what confused, at times, he is able to be redirected and answers appropriately, at times, how ever, all he can talk about is how soon he might be cleared for work.   The patient is able to recall meeting with several doctors in the past day or so, he knows that everyone is talking to him about his abdominal surgery, to some extent, he is also able to explain why he needs surgery. He is how ever, not able to fully comprehend and participate in goals of care discussions, best case, worse case scenario discussions, he is unable to comprehend and discuss about potential complications in the future.   He is able to recognize and state his God son's name. Hence, I called Mr Lucy Antigua and we discussed over the phone in great detail about the current nature of the patient's hospitalization. I introduced myself and palliative care as follows:  Palliative medicine is specialized medical care for people living with serious illness. It focuses on providing relief from the symptoms and stress of a serious illness. The goal is to improve quality of life for both the  patient and the family.  The patient was admitted in February 2018 as well with a volvulus. The patient's wife has dementia, god son will be arriving into town soon. We talked about risks versus benefits, also discussed in detail about comfort measures, should the patient have complications or further decline.   See recommendations below, we will continue to follow along and help guide appropriate decision making.   HCPOA God son who is also his HCPOA agent Mr [id number] [phone]. He is currently in [place], recuperating from a recent back surgery, he will arrive in [place], [place] in the next 48-72 hours.  Wife reportedly has dementia, lives in the same assisted living facility.     SUMMARY OF RECOMMENDATIONS/GOALS OF CARE DISCUSSIONS:  Discussed with patient and his God son who is also his HCPOA agent Mr [email or web address] [phone]:   Continue current mode of care,assessment by several specialists such as anesthesia, cardiology, surgery, GI, pulmonary/CCM.   If ongoing volvulus and patient requires surgery, then proceed with surgery. The hope is that the patient will get relief from the volvulus, a permanent colostomy  if deemed worth  necessary, if it will help the patient eat and improve his quality of life.   Prolonged intubation is not desired, if patient does not improve, even after surgery, or if he has new complications there after, then we will proceed with comfort measures/end of life decisions.  We will continue to follow along and help guide decision making.  Thank you for the consult.    Code Status/Advance Care Planning:  DNR    Symptom Management:    continue current measures   Palliative Prophylaxis:   Bowel Regimen   Psycho-social/Spiritual:   Desire for further Chaplaincy support:no  Additional Recommendations: Caregiving  Support/Resources  Prognosis:   Unable to determine  Discharge Planning: To Be Determined      Primary Diagnoses: Present on  Admission: . Volvulus ([place])   I have reviewed the medical record, interviewed the patient and family, and examined the patient. The following aspects are pertinent.  Past Medical History:  Diagnosis Date  . Anal fissure    Hx of  . Anemia   . Atrial fibrillation (HCC)    chronic anticaog - weintraub  . Congestive heart failure ([place])   . CRI (chronic renal insufficiency)   . Diverticulosis 04/12/1998   Left colon--Flex Sig  . GERD (gastroesophageal reflux disease)   . Internal hemorrhoids   . Osteoarthritis   . Prostate cancer ([place])   . PVD (peripheral vascular disease) ([place])   . Sigmoid volvulus ([place])    recurrent - 10/17, 2/18  . Splenic lesion   . Stroke [facility]) 1978 lower brain stem  . Torticollis, acquired   . Tubular adenoma   . Venous stasis dermatitis    hx venous ulcer/cellulitis 06/2011 R and 04/2011 L   Social History   Social History  . Marital status: Married    Spouse name: N/A  . Number of children: N/A  . Years of education: N/A   Social History Main Topics  . Smoking status: Never Smoker  . Smokeless tobacco: Never Used     Comment: married, wife in [place] since 2013  . Alcohol use No  . Drug use: No  . Sexual activity: Not Asked   Other Topics Concern  . None   Social History Narrative  . None   Family History  Problem Relation Age of Onset  . Heart disease Father   . Breast cancer Mother   . Diabetes Brother   . Diabetes      grandmother  . Colon cancer Neg Hx    Scheduled Meds: . Chlorhexidine Gluconate Cloth  6 each Topical Q0600  . doxazosin  2 mg Oral QHS  . finasteride  5 mg Oral Daily  . mupirocin ointment  1 application Nasal BID  . sodium chloride flush  3 mL Intravenous Q12H   Continuous Infusions: . diltiazem (CARDIZEM) infusion    . heparin 700 Units/hr (09/12/16 0747)  . sodium chloride 0.9 % 1,000 mL with potassium chloride 40 mEq infusion 75 mL/hr at 09/11/16 2248   PRN Meds:. Medications Prior to Admission:  Prior  to Admission medications   Medication Sig Start Date End Date Taking? Authorizing Provider  acetaminophen (TYLENOL) 325 MG tablet Take 650 mg by mouth every 6 (six) hours as needed for moderate pain.   Yes Historical Provider, MD  apixaban (ELIQUIS) 5 MG TABS tablet Take 1 tablet (5 mg total) by mouth 2 (two) times daily. 04/12/16  Yes Barton Dubois, MD  cholecalciferol (VITAMIN D) 1000 UNITS tablet Take 1,000 Units by  mouth daily.    Yes Historical Provider, MD  diltiazem (CARDIZEM CD) 180 MG 24 hr capsule Take 1 capsule (180 mg total) by mouth daily. 04/07/13  Yes Lorretta Harp, MD  doxazosin (CARDURA) 2 MG tablet Take 2 mg by mouth at bedtime.     Yes Historical Provider, MD  finasteride (PROSCAR) 5 MG tablet Take 5 mg by mouth daily.   Yes Historical Provider, MD  furosemide (LASIX) 20 MG tablet Take 20 mg by mouth daily.   Yes Historical Provider, MD  iron polysaccharides (FERREX 150) 150 MG capsule Take 1 capsule (150 mg total) by mouth daily. 09/25/13  Yes Troy Sine, MD  magnesium oxide (MAG-OX) 400 MG tablet Take 400 mg by mouth daily.   Yes Historical Provider, MD  Multiple Vitamins-Minerals (PRESERVISION AREDS 2 PO) Take 1 capsule by mouth 2 (two) times daily.   Yes Historical Provider, MD  pantoprazole (PROTONIX) 40 MG tablet Take 1 tablet (40 mg total) by mouth daily. 04/13/14  Yes Charlynne Cousins, MD  polyethylene glycol [facility] Floria Raveling) packet Take 17 g by mouth daily. 04/12/16  Yes Barton Dubois, MD  potassium chloride (KLOR-CON) 10 MEQ CR tablet Take 10 mEq by mouth daily.    Yes Historical Provider, MD   No Known Allergies Review of Systems Denies pain, is confused.   Physical Exam Awake alert Hard of hearing Abdomen distended S1 S2 Clear Severe torticollis.  Vital Signs: BP (!) 132/93 (BP Location: Left Arm)   Pulse 83   Temp 97.6 F (36.4 C) (Oral)   Resp (!) 24   Ht 5\' 6"  (1.676 m)   Wt 70.8 kg (156 lb 1.4 oz)   SpO2 98%   BMI 25.19 kg/m  Pain  Assessment: No/denies pain POSS *See Group Information*: 1-Acceptable,Awake and alert Pain Score: 0-No pain   SpO2: SpO2: 98 % O2 Device:SpO2: 98 % O2 Flow Rate: .O2 Flow Rate (L/min): 2 L/min  IO: Intake/output summary:  Intake/Output Summary (Last 24 hours) at 09/12/16 5974 Last data filed at 09/12/16 0600  Gross per 24 hour  Intake             1668 ml  Output                0 ml  Net             1668 ml    LBM: Last BM Date: 09/11/16 Baseline Weight: Weight: 66.5 kg (146 lb 9.7 oz) Most recent weight: Weight: 70.8 kg (156 lb 1.4 oz)     Palliative Assessment/Data:   Flowsheet Rows     Most Recent Value  Intake Tab  Referral Department  Surgery  Unit at Time of Referral  Med/Surg Unit  Palliative Care Primary Diagnosis  Other (Comment) [recurrent sigmoid volvulus ]  Palliative Care Type  New Palliative care  Reason for referral  Clarify Goals of Care  Date first seen by Palliative Care  09/12/16  Clinical Assessment  Palliative Performance Scale Score  30%  Pain Max last 24 hours  6  Pain Min Last 24 hours  5  Dyspnea Max Last 24 Hours  3  Dyspnea Min Last 24 hours  2  Psychosocial & Spiritual Assessment  Palliative Care Outcomes  Patient/Family meeting held?  Yes  Who was at the meeting?  patient, god-son who is HCPOA, over the phone   Palliative Care Outcomes  Clarified goals of care      Time In:  10 am  Time Out:  11.10 Time Total:  70 minutes  Greater than 50%  of this time was spent counseling and coordinating care related to the above assessment and plan.  Signed by: Loistine Chance, MD  [phone]  Please contact Palliative Medicine Team phone at [phone] for questions and concerns.  For individual provider: See Shea Evans

## 2016-09-12 NOTE — Progress Notes (Addendum)
Pharmacy: Re- heparin   Patient's an 83 y.o M on heparin drip (while apixaban is on hold) for afib with plan for colectomy on 3/22. CCS ordered for heparin to be off at 0300 on 3/22 in anticipation for OR. APTT now back therapeutic at 81 (goal 66-102 secs). No bleeding documented.   Plan: - continue current heparin drip at 700 units/hr - recheck aPTT at 2330 tonight to confirm level is still therapeutic before switching to daily labs monitoring - d/c heparin drip at 0300 on 3/22 per Dr. Constance Haw, PharmD, BCPS 09/12/2016 5:22 PM

## 2016-09-12 NOTE — Progress Notes (Signed)
CSW contacted by [employer], Narda Rutherford. Staff reported that patient will need someone to sign him back in at discharge. Staff noted that patient's HCPOA Vicente Males Lucy Antigua [phone]) resides out of the [place] and sometimes has a hard time coming to facility to sign patient in. Staff reported that if notified prior to patient's discharge she can e-mail the paperwork to [facility] if necessary.   Abundio Miu, [place] Social Worker [facility] Cell#: [phone]

## 2016-09-12 NOTE — Progress Notes (Signed)
3 Days Post-Op  Subjective: Nurse says he has diarrhea all over the bed multiple times.  He is still very distended.  BS hyperactive.  He want to know if we are going to work.  I am not sure exactly how much he understands.    Objective: Vital signs in last 24 hours: Temp:  [97.6 F (36.4 C)-98.1 F (36.7 C)] 97.6 F (36.4 C) (03/21 0643) Pulse Rate:  [83-120] 83 (03/21 0643) Resp:  [22-27] 24 (03/20 2209) BP: (131-138)/(67-93) 132/93 (03/21 0643) SpO2:  [88 %-99 %] 98 % (03/21 0643) Weight:  [70.8 kg (156 lb 1.4 oz)] 70.8 kg (156 lb 1.4 oz) (03/20 2209) Last BM Date: 09/11/16 1679 IV Urine x 6 Stool x 9 yesterday and 2 day prior 09/10/16 Afebrile, VSS  RR stays up in the 20's most of the time. Last K+ yesterday 3.1  - will recheck this AM 1 view abd xray 3/20:  Persistent abnormal bowel gas pattern with gas distended (measuring up to 9 cm as on prior exam) superiorly extending sigmoid colon which may reflect changes of sigmoid volvulus.  Intake/Output from previous day: 03/20 0701 - 03/21 0700 In: 1679 [I.V.:1679] Out: -  Intake/Output this shift: No intake/output data recorded.  General appearance: alert, cooperative and no distress GI: distended and non tender, BS hyperactive  No pain or discomfort on exam.  Lab Results:   Recent Labs  09/11/16 0403 09/12/16 0506  WBC 4.6 4.8  HGB 10.2* 10.5*  HCT 32.2* 32.7*  PLT 119* 118*    BMET  Recent Labs  09/10/16 0331 09/11/16 0403  NA 141 143  K 3.5 3.1*  CL 108 112*  CO2 27 23  GLUCOSE 98 74  BUN 20 19  CREATININE 0.86 0.81  CALCIUM 8.2* 8.2*   PT/INR  Recent Labs  09/10/16 0331  LABPROT 21.2*  INR 1.80     Recent Labs Lab 09/09/16 1232 09/10/16 0331 09/11/16 0403  AST 18 16 23   ALT 12* 10* 12*  ALKPHOS 68 61 69  BILITOT 1.1 1.1 1.4*  PROT 6.7 5.9* 5.8*  ALBUMIN 3.8 3.3* 3.2*     Lipase     Component Value Date/Time   LIPASE 20 09/09/2016 1232     Studies/Results: Dg Abd Portable  1v  Result Date: 09/11/2016 CLINICAL DATA:  83 year old male with abdominal distention. Subsequent encounter. EXAM: PORTABLE ABDOMEN - 1 VIEW COMPARISON:  09/10/2016 plain film exam.  09/09/2016 CT. FINDINGS: Persistent abnormal bowel gas pattern with gas distended (measuring up to 9 cm as on prior exam) superiorly extending sigmoid colon which may reflect changes of sigmoid volvulus. The possibility of free intraperitoneal air cannot be assessed on a supine view. Scoliosis convex left.  Hip degenerative change greater on left. IMPRESSION: Findings consistent with sigmoid volvulus similar to prior exam as noted above. Electronically Signed   By: Genia Del M.D.   On: 09/11/2016 09:46   Dg Abd Portable 1v  Result Date: 09/10/2016 CLINICAL DATA:  Sigmoid volvulus EXAM: PORTABLE ABDOMEN - 1 VIEW COMPARISON:  09/09/2016 FINDINGS: Persistent gaseous distension of the sigmoid colon with paucity of bowel gas within pelvis highly suspicious for recurrent sigmoid colon volvulus. IMPRESSION: Persistent gaseous distension of the sigmoid colon with paucity of bowel gas within pelvis highly suspicious for recurrent sigmoid colon volvulus. Electronically Signed   By: Lahoma Crocker M.D.   On: 09/10/2016 17:25    Medications: . Chlorhexidine Gluconate Cloth  6 each Topical Q0600  . doxazosin  2  mg Oral QHS  . finasteride  5 mg Oral Daily  . mupirocin ointment  1 application Nasal BID  . sodium chloride flush  3 mL Intravenous Q12H   . diltiazem (CARDIZEM) infusion    . heparin 700 Units/hr (09/12/16 0747)  . sodium chloride 0.9 % 1,000 mL with potassium chloride 40 mEq infusion 75 mL/hr at 09/11/16 2248   Assessment/Plan Recurrent sigmoid volvulus. Atrial fibrillation on Eliquis/last dose 08/13/16 - heparin drip Severe hearing deficit Hx of CVA History of congestive heart failure History of chronic renal insufficiency Severe torticollis History of prostate cancer - slow  growing Osteoarthritis GERD DNR  FEN:IV fluids/K+ being replaced/NPO x sips ID:  No abx DVT: Heparin drip   Plan:  Recheck film, K+ and mag+.  Keep him NPO for now.  Anesthesia recommends General surgery or ENT be available for trach if there are issues with intubation.  Moderate to high risk per CCM and Pulmonary but nothing that can be done to reduce risk  prior to surgery.    LOS: 3 days    Heydy Montilla 09/12/2016 [phone]

## 2016-09-12 NOTE — Care Management Note (Signed)
Case Management Note  Patient Details  Name: Eduardo Deleon MRN: [id number] Date of Birth: 1925/04/05  Subjective/Objective: 83 y/o m admitted w/Volvulus. Readmit-2/19-2/22. Sx following for surgery ?resection/colostomy, abd xray today.cardio-afib w/rvr-cardizem gtt.palliative -DNR,following. From SNF-[facility]-CSW following for return.                   Action/Plan:d/c SNF.   Expected Discharge Date:                  Expected Discharge Plan:  Skilled Nursing Facility  In-House Referral:  Clinical Social Work  Discharge planning Services     Post Acute Care Choice:    Choice offered to:     DME Arranged:    DME Agency:     HH Arranged:    [place] Agency:     Status of Service:  In process, will continue to follow  If discussed at Long Length of Stay Meetings, dates discussed:    Additional Comments:  Dessa Phi, RN 09/12/2016, 12:37 PM

## 2016-09-12 NOTE — Progress Notes (Signed)
Progress Note  Patient Name: Eduardo Deleon Date of Encounter: 09/12/2016  Primary Cardiologist: [place] this morning. Answers to name, but unaware of surroundings.   Inpatient Medications    Scheduled Meds: . Chlorhexidine Gluconate Cloth  6 each Topical Q0600  . doxazosin  2 mg Oral QHS  . finasteride  5 mg Oral Daily  . mupirocin ointment  1 application Nasal BID  . sodium chloride flush  3 mL Intravenous Q12H   Continuous Infusions: . diltiazem (CARDIZEM) infusion    . heparin 700 Units/hr (09/12/16 0747)  . sodium chloride 0.9 % 1,000 mL with potassium chloride 40 mEq infusion 75 mL/hr at 09/11/16 2248   PRN Meds:    Vital Signs    Vitals:   09/11/16 1800 09/11/16 1900 09/11/16 2209 09/12/16 0643  BP:   131/84 (!) 132/93  Pulse: (!) 105 (!) 120 (!) 101 83  Resp: (!) 27 (!) 22 (!) 24   Temp:   98.1 F (36.7 C) 97.6 F (36.4 C)  TempSrc:   Oral Oral  SpO2: 97% (!) 88% 99% 98%  Weight:   156 lb 1.4 oz (70.8 kg)   Height:   5\' 6"  (1.676 m)     Intake/Output Summary (Last 24 hours) at 09/12/16 1109 Last data filed at 09/12/16 0600  Gross per 24 hour  Intake             1668 ml  Output                0 ml  Net             1668 ml   Filed Weights   09/09/16 1920 09/11/16 2209  Weight: 146 lb 9.7 oz (66.5 kg) 156 lb 1.4 oz (70.8 kg)    Telemetry    AF RVR - Personally Reviewed  ECG    N/A - Personally Reviewed  Physical Exam   General: Well developed, well nourished, male appearing in no acute distress. Head: Normocephalic, atraumatic.  Neck: Severe torticollis, no JVD.  Lungs:  Resp regular and unlabored, Course rhonchi. Heart: Irreg Irreg, S1, S2, no S3, S4, or murmur; no rub. Abdomen: Soft, non-tender, non-distended with normoactive bowel sounds. No hepatomegaly. No rebound/guarding. No obvious abdominal masses. Extremities: No clubbing, cyanosis, edema. Distal pedal pulses are 2+ bilaterally. Neuro: Alert and  oriented X 3. Moves all extremities spontaneously. Psych: Normal affect.  Labs    Chemistry Recent Labs Lab 09/09/16 1232 09/10/16 0331 09/11/16 0403 09/12/16 0506  NA 142 141 143 145  K 3.8 3.5 3.1* 3.9  CL 110 108 112* 115*  CO2 27 27 23  19*  GLUCOSE 126* 98 74 76  BUN 24* 20 19 17   CREATININE 0.91 0.86 0.81 0.77  CALCIUM 8.9 8.2* 8.2* 8.4*  PROT 6.7 5.9* 5.8*  --   ALBUMIN 3.8 3.3* 3.2*  --   AST 18 16 23   --   ALT 12* 10* 12*  --   ALKPHOS 68 61 69  --   BILITOT 1.1 1.1 1.4*  --   GFRNONAA >60 >60 >60 >60  GFRAA >60 >60 >60 >60  ANIONGAP 5 6 8 11      Hematology Recent Labs Lab 09/10/16 0331 09/11/16 0403 09/12/16 0506  WBC 6.8 4.6 4.8  RBC 3.65* 3.60* 3.56*  HGB 10.7* 10.2* 10.5*  HCT 32.8* 32.2* 32.7*  MCV 89.9 89.4 91.9  MCH 29.3 28.3 29.5  MCHC 32.6 31.7 32.1  RDW  17.8* 17.7* 17.8*  PLT PLATELET CLUMPS NOTED ON SMEAR, UNABLE TO ESTIMATE 119* 118*    Cardiac EnzymesNo results for input(s): TROPONINI in the last 168 hours. No results for input(s): TROPIPOC in the last 168 hours.   BNPNo results for input(s): BNP, PROBNP in the last 168 hours.   DDimer No results for input(s): DDIMER in the last 168 hours.    Radiology    Dg Abd 2 Views  Result Date: 09/12/2016 CLINICAL DATA:  Nausea, vomiting and abdominal pain. Recurrent volvulus. EXAM: ABDOMEN - 2 VIEW COMPARISON:  09/11/2016 and CT 09/10/2015 FINDINGS: Again noted is a large gas-filled loop of bowel in the right abdomen that is most compatible with dilated sigmoid colon. No significant gas in the rectum. Bowel gas pattern remains compatible with a sigmoid volvulus and similar to the previous examination. No evidence for free air on the left lateral decubitus image. Again noted is levoscoliosis of the thoracolumbar spine. Multiple phleboliths in the pelvis. IMPRESSION: Distended bowel in the abdomen and the configuration may remains compatible with a sigmoid volvulus. Minimal change from the recent  comparison examination. No evidence for free air. Electronically Signed   By: Markus Daft M.D.   On: 09/12/2016 10:39   Dg Abd Portable 1v  Result Date: 09/11/2016 CLINICAL DATA:  83 year old male with abdominal distention. Subsequent encounter. EXAM: PORTABLE ABDOMEN - 1 VIEW COMPARISON:  09/10/2016 plain film exam.  09/09/2016 CT. FINDINGS: Persistent abnormal bowel gas pattern with gas distended (measuring up to 9 cm as on prior exam) superiorly extending sigmoid colon which may reflect changes of sigmoid volvulus. The possibility of free intraperitoneal air cannot be assessed on a supine view. Scoliosis convex left.  Hip degenerative change greater on left. IMPRESSION: Findings consistent with sigmoid volvulus similar to prior exam as noted above. Electronically Signed   By: Genia Del M.D.   On: 09/11/2016 09:46   Dg Abd Portable 1v  Result Date: 09/10/2016 CLINICAL DATA:  Sigmoid volvulus EXAM: PORTABLE ABDOMEN - 1 VIEW COMPARISON:  09/09/2016 FINDINGS: Persistent gaseous distension of the sigmoid colon with paucity of bowel gas within pelvis highly suspicious for recurrent sigmoid colon volvulus. IMPRESSION: Persistent gaseous distension of the sigmoid colon with paucity of bowel gas within pelvis highly suspicious for recurrent sigmoid colon volvulus. Electronically Signed   By: Lahoma Crocker M.D.   On: 09/10/2016 17:25    Cardiac Studies   N/A  Patient Profile     83 y.o. male PMH of SSS, permanent Afib, GERD, prostate Ca, sigmoid volvulus, PVD, CVA, and torticollis who presented with abd pain 2/2 recent sigmoid volvulus. Being considered for surgery  Assessment & Plan    1. AF RVR: Rates remain elevated in the 120s-140s. Will started IV dilt at 5mg /hr for better rate control as he is currently NPO with this volvulus. Remains on IV heparin for anticoagulation, Eliquis held.   2. Sigmoid volvulus: Surgery following. Remains moderate/high risk for intervention. More confused this  morning, unsure of how much he understands of what is going on with plan of care.   Signed, Reino Bellis, NP  09/12/2016, 11:09 AM    I have examined the patient and reviewed assessment and plan.  Agree with above as stated.  IV dilt for rate control.  Using IV given GI issues.  Increase dilt for HR as needed and as BP tolerates.  Larae Grooms

## 2016-09-12 NOTE — Progress Notes (Signed)
PROGRESS NOTE  Eduardo Deleon KVQ:[id number] DOB: 1925-01-31 DOA: 09/09/2016 PCP: Pcp Not In System  HPI/Recap of past 88 hours: 83 year old male with past medical history of atrial fibrillation status post cardioversion on chronic anticoagulation, severe torticollis, chronic diastolic heart failure , dementia with secondary aspiration and recurrent sigmoid volvulus admitted 3/18 for abdominal pain and found to again have recurrent sigmoid volvulus. Patient has had previous surgeries in the past and at times has refused. General surgery was consulted and after discussion with cardiology and palliative care discussion with patient's son who is his healthcare power of attorney, plan will be to attempt surgery with understanding that he is of some mortality risk if he is not able to be extubated, then he will be made comfort care.  Assessment/Plan: Active Problems:   Volvulus [facility]): Surgery pending. Seen by cardiology and generally cleared, although he does remain at risk. Dementia: Stable, baseline. Severe torticollis: Patient actually was successfully intubated in 2015 despite issues.  atrial fibrillation on chronic anticoagulation: Chads score is 5. Eliquis on hold. Patient currently on IV heparin. Cardizem added for tachycardia Chronic diastolic heart failure: Currently stable    Abdominal pain   Abnormal CT of the abdomen   Preoperative cardiovascular examination   SBO (small bowel obstruction)   Code Status: DO NOT RESUSCITATE   Family Communication: Left message for son who lives in [place]   Disposition Plan: Surgery, outcome depending on how he does    Consultants:  General surgery  Cardiology  Palliative care   Procedures:  Planned abdominal surgery for volvulus   Antimicrobials:  IV Ancef ordered for surgery   DVT prophylaxis: On IV heparin   Objective: Vitals:   09/11/16 1900 09/11/16 2209 09/12/16 0643 09/12/16 1502  BP:  131/84 (!) 132/93 124/84    Pulse: (!) 120 (!) 101 83 92  Resp: (!) 22 (!) 24  19  Temp:  98.1 F (36.7 C) 97.6 F (36.4 C) 97.8 F (36.6 C)  TempSrc:  Oral Oral Oral  SpO2: (!) 88% 99% 98% 99%  Weight:  70.8 kg (156 lb 1.4 oz)    Height:  5\' 6"  (1.676 m)      Intake/Output Summary (Last 24 hours) at 09/12/16 1856 Last data filed at 09/12/16 1252  Gross per 24 hour  Intake           1126.5 ml  Output                0 ml  Net           1126.5 ml   Filed Weights   09/09/16 1920 09/11/16 2209  Weight: 66.5 kg (146 lb 9.7 oz) 70.8 kg (156 lb 1.4 oz)    Exam:   General:  Confused, noted torticollis   Cardiovascular: Irregular, tachycardic   Respiratory: Clear to auscultation bilaterally   Abdomen: Soft, nondistended, positive bowel sounds   Musculoskeletal: No clubbing or cyanosis, trace edema   Psychiatry: Chronic dementia, otherwise no acute psychosis    Data Reviewed: CBC:  Recent Labs Lab 09/09/16 1232 09/10/16 0331 09/11/16 0403 09/12/16 0506  WBC 9.0 6.8 4.6 4.8  NEUTROABS  --   --  3.5  --   HGB 11.1* 10.7* 10.2* 10.5*  HCT 34.5* 32.8* 32.2* 32.7*  MCV 89.8 89.9 89.4 91.9  PLT 140* PLATELET CLUMPS NOTED ON SMEAR, UNABLE TO ESTIMATE 119* 643*   Basic Metabolic Panel:  Recent Labs Lab 09/09/16 1232 09/10/16 0331 09/11/16 0403 09/12/16 3295  NA 142 141 143 145  K 3.8 3.5 3.1* 3.9  CL 110 108 112* 115*  CO2 27 27 23  19*  GLUCOSE 126* 98 74 76  BUN 24* 20 19 17   CREATININE 0.91 0.86 0.81 0.77  CALCIUM 8.9 8.2* 8.2* 8.4*  MG  --   --   --  2.1   GFR: Estimated Creatinine Clearance: 56.5 mL/min (by C-G formula based on SCr of 0.77 mg/dL). Liver Function Tests:  Recent Labs Lab 09/09/16 1232 09/10/16 0331 09/11/16 0403  AST 18 16 23   ALT 12* 10* 12*  ALKPHOS 68 61 69  BILITOT 1.1 1.1 1.4*  PROT 6.7 5.9* 5.8*  ALBUMIN 3.8 3.3* 3.2*    Recent Labs Lab 09/09/16 1232  LIPASE 20   No results for input(s): AMMONIA in the last 168 hours. Coagulation  Profile:  Recent Labs Lab 09/10/16 0331  INR 1.80   Cardiac Enzymes: No results for input(s): CKTOTAL, CKMB, CKMBINDEX, TROPONINI in the last 168 hours. BNP (last 3 results) No results for input(s): PROBNP in the last 8760 hours. HbA1C: No results for input(s): HGBA1C in the last 72 hours. CBG: No results for input(s): GLUCAP in the last 168 hours. Lipid Profile: No results for input(s): CHOL, HDL, LDLCALC, TRIG, CHOLHDL, LDLDIRECT in the last 72 hours. Thyroid Function Tests: No results for input(s): TSH, T4TOTAL, FREET4, T3FREE, THYROIDAB in the last 72 hours. Anemia Panel: No results for input(s): VITAMINB12, FOLATE, FERRITIN, TIBC, IRON, RETICCTPCT in the last 72 hours. Urine analysis:    Component Value Date/Time   COLORURINE YELLOW 08/15/2016 2145   APPEARANCEUR CLEAR 08/15/2016 2145   LABSPEC 1.008 08/15/2016 2145   PHURINE 7.0 08/15/2016 2145   GLUCOSEU NEGATIVE 08/15/2016 2145   HGBUR NEGATIVE 08/15/2016 2145   BILIRUBINUR NEGATIVE 08/15/2016 2145   [place] NEGATIVE 08/15/2016 2145   PROTEINUR NEGATIVE 08/15/2016 2145   UROBILINOGEN 1.0 03/29/2014 1609   NITRITE POSITIVE (A) 08/15/2016 2145   LEUKOCYTESUR LARGE (A) 08/15/2016 2145   Sepsis Labs: @LABRCNTIP (procalcitonin:4,lacticidven:4)  )No results found for this or any previous visit (from the past 240 hour(s)).    Studies: Dg Abd 2 Views  Result Date: 09/12/2016 CLINICAL DATA:  Nausea, vomiting and abdominal pain. Recurrent volvulus. EXAM: ABDOMEN - 2 VIEW COMPARISON:  09/11/2016 and CT 09/10/2015 FINDINGS: Again noted is a large gas-filled loop of bowel in the right abdomen that is most compatible with dilated sigmoid colon. No significant gas in the rectum. Bowel gas pattern remains compatible with a sigmoid volvulus and similar to the previous examination. No evidence for free air on the left lateral decubitus image. Again noted is levoscoliosis of the thoracolumbar spine. Multiple phleboliths in the  pelvis. IMPRESSION: Distended bowel in the abdomen and the configuration may remains compatible with a sigmoid volvulus. Minimal change from the recent comparison examination. No evidence for free air. Electronically Signed   By: Markus Daft M.D.   On: 09/12/2016 10:39    Scheduled Meds: . [START ON 09/13/2016] cefoTEtan (CEFOTAN) IV  2 g Intravenous On Call  . Chlorhexidine Gluconate Cloth  6 each Topical Q0600  . doxazosin  2 mg Oral QHS  . finasteride  5 mg Oral Daily  . mupirocin ointment  1 application Nasal BID  . sodium chloride flush  3 mL Intravenous Q12H    Continuous Infusions: . diltiazem (CARDIZEM) infusion 5 mg/hr (09/12/16 1246)  . heparin 700 Units/hr (09/12/16 0747)  . sodium chloride 0.9 % 1,000 mL with potassium chloride 40 mEq infusion  75 mL/hr at 09/12/16 1734     LOS: 3 days     Annita Brod, MD [facility] Pager [phone]  If 7PM-7AM, please contact night-coverage [email or web address] Password [facility] 09/12/2016, 6:56 PM

## 2016-09-13 ENCOUNTER — Inpatient Hospital Stay (HOSPITAL_COMMUNITY): Payer: Medicare Other

## 2016-09-13 ENCOUNTER — Inpatient Hospital Stay (HOSPITAL_COMMUNITY): Payer: Medicare Other | Admitting: Anesthesiology

## 2016-09-13 ENCOUNTER — Encounter (HOSPITAL_COMMUNITY)
Admission: EM | Disposition: A | Discharge status: Home / Self Care | Payer: Self-pay | Source: Home / Self Care | Attending: Pulmonary Disease

## 2016-09-13 ENCOUNTER — Encounter (HOSPITAL_COMMUNITY): Payer: Self-pay | Admitting: Anesthesiology

## 2016-09-13 DIAGNOSIS — J9600 Acute respiratory failure, unspecified whether with hypoxia or hypercapnia: Secondary | ICD-10-CM

## 2016-09-13 DIAGNOSIS — Z9049 Acquired absence of other specified parts of digestive tract: Secondary | ICD-10-CM

## 2016-09-13 HISTORY — PX: COLON RESECTION SIGMOID: SHX6737

## 2016-09-13 HISTORY — PX: SPIGELIAN HERNIA: SHX6100

## 2016-09-13 LAB — BLOOD GAS, ARTERIAL
ACID-BASE DEFICIT: 12.1 mmol/L — AB (ref 0.0–2.0)
Acid-base deficit: 10.3 mmol/L — ABNORMAL HIGH (ref 0.0–2.0)
Bicarbonate: 14.5 mmol/L — ABNORMAL LOW (ref 20.0–28.0)
Bicarbonate: 15.3 mmol/L — ABNORMAL LOW (ref 20.0–28.0)
DRAWN BY: 11249
Drawn by: 331471
FIO2: 100
FIO2: 50
LHR: 15 {breaths}/min
MECHVT: 510 mL
O2 SAT: 99.7 %
O2 Saturation: 99.4 %
PATIENT TEMPERATURE: 98.6
PCO2 ART: 37.1 mmHg (ref 32.0–48.0)
PEEP/CPAP: 5 cmH2O
PEEP: 5 cmH2O
PO2 ART: 433 mmHg — AB (ref 83.0–108.0)
Patient temperature: 97.6
RATE: 18 resp/min
VT: 510 mL
pCO2 arterial: 33.5 mmHg (ref 32.0–48.0)
pH, Arterial: 7.215 — ABNORMAL LOW (ref 7.350–7.450)
pH, Arterial: 7.279 — ABNORMAL LOW (ref 7.350–7.450)
pO2, Arterial: 195 mmHg — ABNORMAL HIGH (ref 83.0–108.0)

## 2016-09-13 LAB — COMPREHENSIVE METABOLIC PANEL
ALBUMIN: 3.4 g/dL — AB (ref 3.5–5.0)
ALK PHOS: 66 U/L (ref 38–126)
ALT: 22 U/L (ref 17–63)
AST: 44 U/L — ABNORMAL HIGH (ref 15–41)
Anion gap: 13 (ref 5–15)
BILIRUBIN TOTAL: 1.7 mg/dL — AB (ref 0.3–1.2)
BUN: 14 mg/dL (ref 6–20)
CALCIUM: 8.6 mg/dL — AB (ref 8.9–10.3)
CO2: 16 mmol/L — AB (ref 22–32)
CREATININE: 0.86 mg/dL (ref 0.61–1.24)
Chloride: 119 mmol/L — ABNORMAL HIGH (ref 101–111)
GFR calc Af Amer: >60 mL/min (ref >60)
GFR calc non Af Amer: >60 mL/min (ref >60)
GLUCOSE: 94 mg/dL (ref 65–99)
Potassium: 4.1 mmol/L (ref 3.5–5.1)
Sodium: 148 mmol/L — ABNORMAL HIGH (ref 135–145)
TOTAL PROTEIN: 6.2 g/dL — AB (ref 6.5–8.1)

## 2016-09-13 LAB — APTT: APTT: 83 s — AB (ref 24–36)

## 2016-09-13 LAB — CBC
HEMATOCRIT: 32.3 % — AB (ref 39.0–52.0)
HEMOGLOBIN: 10.2 g/dL — AB (ref 13.0–17.0)
MCH: 29.5 pg (ref 26.0–34.0)
MCHC: 31.6 g/dL (ref 30.0–36.0)
MCV: 93.4 fL (ref 78.0–100.0)
Platelets: 106 10*3/uL — ABNORMAL LOW (ref 150–400)
RBC: 3.46 MIL/uL — AB (ref 4.22–5.81)
RDW: 18.4 % — ABNORMAL HIGH (ref 11.5–15.5)
WBC: 4.7 10*3/uL (ref 4.0–10.5)

## 2016-09-13 LAB — ABO/RH: ABO/RH(D): A NEG

## 2016-09-13 LAB — GLUCOSE, CAPILLARY: GLUCOSE-CAPILLARY: 105 mg/dL — AB (ref 65–99)

## 2016-09-13 LAB — TYPE AND SCREEN
ABO/RH(D): A NEG
ANTIBODY SCREEN: NEGATIVE

## 2016-09-13 LAB — HEPARIN LEVEL (UNFRACTIONATED): Heparin Unfractionated: 1.38 IU/mL — ABNORMAL HIGH (ref 0.30–0.70)

## 2016-09-13 SURGERY — COLECTOMY, SIGMOID, OPEN
Anesthesia: General

## 2016-09-13 MED ORDER — ORAL CARE MOUTH RINSE
15.0000 mL | Freq: Four times a day (QID) | OROMUCOSAL | Status: DC
  Administered 2016-09-14 – 2016-09-17 (×11): 15 mL via OROMUCOSAL

## 2016-09-13 MED ORDER — ROCURONIUM BROMIDE 50 MG/5ML IV SOSY
PREFILLED_SYRINGE | INTRAVENOUS | Status: AC
  Filled 2016-09-13: qty 5

## 2016-09-13 MED ORDER — SODIUM CHLORIDE 0.9 % IJ SOLN
INTRAMUSCULAR | Status: AC
  Filled 2016-09-13: qty 50

## 2016-09-13 MED ORDER — HEPARIN (PORCINE) IN NACL 100-0.45 UNIT/ML-% IJ SOLN
700.0000 [IU]/h | INTRAMUSCULAR | Status: DC
  Administered 2016-09-13 – 2016-09-15 (×2): 700 [IU]/h via INTRAVENOUS
  Filled 2016-09-13 (×3): qty 250

## 2016-09-13 MED ORDER — CEFOTETAN DISODIUM-DEXTROSE 2-2.08 GM-% IV SOLR
INTRAVENOUS | Status: AC
  Filled 2016-09-13: qty 50

## 2016-09-13 MED ORDER — LACTATED RINGERS IV SOLN
INTRAVENOUS | Status: DC | PRN
  Administered 2016-09-13: 10:00:00 via INTRAVENOUS

## 2016-09-13 MED ORDER — PHENYLEPHRINE HCL 10 MG/ML IJ SOLN
INTRAMUSCULAR | Status: AC
  Filled 2016-09-13: qty 1

## 2016-09-13 MED ORDER — BUPIVACAINE LIPOSOME 1.3 % IJ SUSP
INTRAMUSCULAR | Status: DC | PRN
  Administered 2016-09-13: 20 mL

## 2016-09-13 MED ORDER — LACTATED RINGERS IV SOLN
INTRAVENOUS | Status: DC
Start: 2016-09-13 — End: 2016-09-14
  Administered 2016-09-13 – 2016-09-14 (×2): via INTRAVENOUS

## 2016-09-13 MED ORDER — OXYCODONE HCL 5 MG PO TABS: 5.0000 mg | ORAL_TABLET | Freq: Once | ORAL | Status: DC | PRN

## 2016-09-13 MED ORDER — ROCURONIUM BROMIDE 10 MG/ML (PF) SYRINGE
PREFILLED_SYRINGE | INTRAVENOUS | Status: DC | PRN
  Administered 2016-09-13: 20 mg via INTRAVENOUS
  Administered 2016-09-13: 10 mg via INTRAVENOUS
  Administered 2016-09-13: 20 mg via INTRAVENOUS
  Administered 2016-09-13: 30 mg via INTRAVENOUS
  Administered 2016-09-13: 20 mg via INTRAVENOUS

## 2016-09-13 MED ORDER — ONDANSETRON HCL 4 MG/2ML IJ SOLN
INTRAMUSCULAR | Status: DC | PRN
  Administered 2016-09-13: 4 mg via INTRAVENOUS

## 2016-09-13 MED ORDER — BUPIVACAINE LIPOSOME 1.3 % IJ SUSP
20.0000 mL | Freq: Once | INTRAMUSCULAR | Status: DC
  Filled 2016-09-13: qty 20

## 2016-09-13 MED ORDER — OXYCODONE HCL 5 MG/5ML PO SOLN: 5.0000 mg | Freq: Once | ORAL | Status: DC | PRN

## 2016-09-13 MED ORDER — FENTANYL CITRATE (PF) 100 MCG/2ML IJ SOLN
INTRAMUSCULAR | Status: DC | PRN
  Administered 2016-09-13 (×3): 50 ug via INTRAVENOUS

## 2016-09-13 MED ORDER — FENTANYL CITRATE (PF) 100 MCG/2ML IJ SOLN
50.0000 ug | INTRAMUSCULAR | Status: DC | PRN
  Administered 2016-09-13 – 2016-09-16 (×11): 50 ug via INTRAVENOUS
  Filled 2016-09-13 (×13): qty 2

## 2016-09-13 MED ORDER — SUCCINYLCHOLINE CHLORIDE 200 MG/10ML IV SOSY
PREFILLED_SYRINGE | INTRAVENOUS | Status: AC
  Filled 2016-09-13: qty 10

## 2016-09-13 MED ORDER — FENTANYL CITRATE (PF) 100 MCG/2ML IJ SOLN
50.0000 ug | INTRAMUSCULAR | Status: AC | PRN
  Administered 2016-09-13 – 2016-09-14 (×3): 50 ug via INTRAVENOUS

## 2016-09-13 MED ORDER — SODIUM CHLORIDE 0.9 % IV SOLN: 40.0000 mL | Freq: Once | INTRAVENOUS | Status: DC

## 2016-09-13 MED ORDER — FENTANYL CITRATE (PF) 100 MCG/2ML IJ SOLN
INTRAMUSCULAR | Status: AC
  Filled 2016-09-13: qty 2

## 2016-09-13 MED ORDER — ONDANSETRON HCL 4 MG/2ML IJ SOLN: 4.0000 mg | Freq: Four times a day (QID) | INTRAMUSCULAR | Status: DC | PRN

## 2016-09-13 MED ORDER — LIDOCAINE 2% (20 MG/ML) 5 ML SYRINGE
INTRAMUSCULAR | Status: DC | PRN
  Administered 2016-09-13: 50 mg via INTRAVENOUS

## 2016-09-13 MED ORDER — SUCCINYLCHOLINE CHLORIDE 200 MG/10ML IV SOSY
PREFILLED_SYRINGE | INTRAVENOUS | Status: DC | PRN
  Administered 2016-09-13: 100 mg via INTRAVENOUS

## 2016-09-13 MED ORDER — PHENYLEPHRINE 40 MCG/ML (10ML) SYRINGE FOR IV PUSH (FOR BLOOD PRESSURE SUPPORT)
PREFILLED_SYRINGE | INTRAVENOUS | Status: DC | PRN
  Administered 2016-09-13: 80 ug via INTRAVENOUS

## 2016-09-13 MED ORDER — PANTOPRAZOLE SODIUM 40 MG IV SOLR
40.0000 mg | Freq: Two times a day (BID) | INTRAVENOUS | Status: DC
  Administered 2016-09-13 – 2016-09-21 (×16): 40 mg via INTRAVENOUS
  Filled 2016-09-13 (×16): qty 40

## 2016-09-13 MED ORDER — PROPOFOL 10 MG/ML IV BOLUS
INTRAVENOUS | Status: AC
  Filled 2016-09-13: qty 20

## 2016-09-13 MED ORDER — FENTANYL CITRATE (PF) 100 MCG/2ML IJ SOLN
25.0000 ug | INTRAMUSCULAR | Status: DC | PRN
  Filled 2016-09-13: qty 2

## 2016-09-13 MED ORDER — ALBUMIN HUMAN 5 % IV SOLN
INTRAVENOUS | Status: DC | PRN
  Administered 2016-09-13: 13:00:00 via INTRAVENOUS

## 2016-09-13 MED ORDER — LACTATED RINGERS IV SOLN
INTRAVENOUS | Status: DC | PRN
  Administered 2016-09-13 (×2): via INTRAVENOUS

## 2016-09-13 MED ORDER — CHLORHEXIDINE GLUCONATE 0.12% ORAL RINSE (MEDLINE KIT)
15.0000 mL | Freq: Two times a day (BID) | OROMUCOSAL | Status: DC
  Administered 2016-09-13 – 2016-09-17 (×9): 15 mL via OROMUCOSAL

## 2016-09-13 MED ORDER — ETOMIDATE 2 MG/ML IV SOLN
INTRAVENOUS | Status: DC | PRN
  Administered 2016-09-13: 20 mg via INTRAVENOUS

## 2016-09-13 MED ORDER — LIDOCAINE 2% (20 MG/ML) 5 ML SYRINGE
INTRAMUSCULAR | Status: AC
  Filled 2016-09-13: qty 5

## 2016-09-13 MED ORDER — PHENYLEPHRINE HCL 10 MG/ML IJ SOLN
INTRAVENOUS | Status: DC | PRN
  Administered 2016-09-13: 10 ug/min via INTRAVENOUS

## 2016-09-13 SURGICAL SUPPLY — 76 items
ATTRACTOMAT 16X20 MAGNETIC DRP (DRAPES) ×2 IMPLANT
BAG SPEC THK2 15X12 ZIP CLS (MISCELLANEOUS) ×2
BAG ZIPLOCK 12X15 (MISCELLANEOUS) ×4 IMPLANT
BLADE EXTENDED COATED 6.5IN (ELECTRODE) ×1 IMPLANT
BLADE HEX COATED 2.75 (ELECTRODE) ×2 IMPLANT
BLADE SURG 15 STRL LF DISP TIS (BLADE) ×2 IMPLANT
BLADE SURG 15 STRL SS (BLADE)
CELLS DAT CNTRL 66122 CELL SVR (MISCELLANEOUS) IMPLANT
CLIP TI LARGE 6 (CLIP) IMPLANT
CORDS BIPOLAR (ELECTRODE) IMPLANT
COUNTER NEEDLE 20 DBL MAG RED (NEEDLE) ×4 IMPLANT
COVER MAYO STAND STRL (DRAPES) IMPLANT
COVER SURGICAL LIGHT HANDLE (MISCELLANEOUS) ×12 IMPLANT
DEVICE PMI PUNCTURE CLOSURE (MISCELLANEOUS) ×2 IMPLANT
DRAIN CHANNEL 19F RND (DRAIN) IMPLANT
DRAPE LAPAROSCOPIC ABDOMINAL (DRAPES) IMPLANT
DRSG OPSITE POSTOP 4X10 (GAUZE/BANDAGES/DRESSINGS) ×4 IMPLANT
DRSG OPSITE POSTOP 4X6 (GAUZE/BANDAGES/DRESSINGS) ×2 IMPLANT
DRSG OPSITE POSTOP 4X8 (GAUZE/BANDAGES/DRESSINGS) IMPLANT
ELECT COATED BLADE 2.86 ST (ELECTRODE) ×4 IMPLANT
ELECT PENCIL ROCKER SW 15FT (MISCELLANEOUS) ×4 IMPLANT
ELECT REM PT RETURN 15FT ADLT (MISCELLANEOUS) ×8 IMPLANT
EVACUATOR SILICONE 100CC (DRAIN) IMPLANT
GAUZE SPONGE 4X4 12PLY STRL (GAUZE/BANDAGES/DRESSINGS) ×4 IMPLANT
GAUZE SPONGE 4X4 16PLY XRAY LF (GAUZE/BANDAGES/DRESSINGS) ×4 IMPLANT
GLOVE BIO SURGEON STRL SZ7.5 (GLOVE) ×8 IMPLANT
GLOVE BIOGEL PI IND STRL 7.0 (GLOVE) ×2 IMPLANT
GLOVE BIOGEL PI INDICATOR 7.0 (GLOVE) ×2
GLOVE ECLIPSE 8.0 STRL XLNG CF (GLOVE) ×4 IMPLANT
GLOVE INDICATOR 8.0 STRL GRN (GLOVE) ×8 IMPLANT
GOWN STRL REUS W/TWL XL LVL3 (GOWN DISPOSABLE) ×28 IMPLANT
HOLDER TRACH TUBE VELCRO 19.5 (MISCELLANEOUS) ×2 IMPLANT
LEGGING LITHOTOMY PAIR STRL (DRAPES) IMPLANT
LIGASURE IMPACT 36 18CM CVD LR (INSTRUMENTS) ×2 IMPLANT
NDL HYPO 25X1 1.5 SAFETY (NEEDLE) ×2 IMPLANT
NEEDLE HYPO 25X1 1.5 SAFETY (NEEDLE) ×4 IMPLANT
NS IRRIG 1000ML POUR BTL (IV SOLUTION) ×12 IMPLANT
PACK COLON (CUSTOM PROCEDURE TRAY) ×4 IMPLANT
PACK EENT SPLIT (PACKS) ×4 IMPLANT
PORT LAP GEL ALEXIS MED 5-9CM (MISCELLANEOUS) ×2 IMPLANT
RETRACTOR WND ALEXIS 18 MED (MISCELLANEOUS) IMPLANT
RTRCTR WOUND ALEXIS 18CM MED (MISCELLANEOUS)
SCISSORS LAP 5X35 DISP (ENDOMECHANICALS) ×2 IMPLANT
SEALER TISSUE X1 CVD JAW (INSTRUMENTS) IMPLANT
SHEARS CURVED HARMONIC AC 45CM (MISCELLANEOUS) ×2 IMPLANT
SPONGE DRAIN TRACH 4X4 STRL 2S (GAUZE/BANDAGES/DRESSINGS) ×2 IMPLANT
STAPLER PROXIMATE 100MM BLUE (MISCELLANEOUS) ×2 IMPLANT
STAPLER PROXIMATE 75MM BLUE (STAPLE) ×4 IMPLANT
STAPLER VISISTAT 35W (STAPLE) ×4 IMPLANT
SUCTION POOLE TIP (SUCTIONS) ×4 IMPLANT
SUT CHROMIC 2 0 SH (SUTURE) IMPLANT
SUT ETHILON 5 0 PS 2 18 (SUTURE) IMPLANT
SUT NOVA NAB GS-21 0 18 T12 DT (SUTURE) ×4 IMPLANT
SUT PDS AB 1 CTX 36 (SUTURE) IMPLANT
SUT PDS AB 1 TP1 96 (SUTURE) IMPLANT
SUT PDS AB 3-0 SH 27 (SUTURE) ×4 IMPLANT
SUT PDS AB 4-0 SH 27 (SUTURE) IMPLANT
SUT PROLENE 2 0 BLUE (SUTURE) IMPLANT
SUT SILK 2 0 (SUTURE) ×8
SUT SILK 2 0 SH CR/8 (SUTURE) ×10 IMPLANT
SUT SILK 2 0SH CR/8 30 (SUTURE) ×2 IMPLANT
SUT SILK 2-0 18XBRD TIE 12 (SUTURE) ×4 IMPLANT
SUT SILK 2-0 30XBRD TIE 12 (SUTURE) IMPLANT
SUT SILK 3 0 (SUTURE) ×8
SUT SILK 3 0 SH CR/8 (SUTURE) ×8 IMPLANT
SUT SILK 3-0 18XBRD TIE 12 (SUTURE) ×4 IMPLANT
SUT VIC AB 3-0 SH 18 (SUTURE) ×4 IMPLANT
SUT VIC AB 4-0 PS2 27 (SUTURE) IMPLANT
SUT VICRYL 0 TIES 12 18 (SUTURE) ×2 IMPLANT
SYR 10ML LL (SYRINGE) ×4 IMPLANT
SYR CONTROL 10ML LL (SYRINGE) ×4 IMPLANT
TOWEL OR 17X26 10 PK STRL BLUE (TOWEL DISPOSABLE) ×8 IMPLANT
TOWEL OR NON WOVEN STRL DISP B (DISPOSABLE) ×4 IMPLANT
TRAY FOLEY W/METER SILVER 16FR (SET/KITS/TRAYS/PACK) ×4 IMPLANT
WATER STERILE IRR 1500ML POUR (IV SOLUTION) ×4 IMPLANT
YANKAUER SUCT BULB TIP 10FT TU (MISCELLANEOUS) ×1 IMPLANT

## 2016-09-13 NOTE — Anesthesia Preprocedure Evaluation (Signed)
Anesthesia Evaluation  Patient identified by MRN, date of birth, ID band Patient confused    Reviewed: Allergy & Precautions, H&P , NPO status , Patient's Chart, lab work & pertinent test results  Airway   TM Distance: >3 FB Neck ROM: limited  Mouth opening: Limited Mouth Opening Comment: Torticollis.  Head tilted to the left.  Trachea palpable at midline.  Pt uncooperative with full airway exam.  Several teeth are missing.  Remaining dentition in poor condition. Dental   Pulmonary shortness of breath,    breath sounds clear to auscultation       Cardiovascular + Peripheral Vascular Disease and +CHF  + dysrhythmias Atrial Fibrillation  Rhythm:regular Rate:Normal     Neuro/Psych dementia  Neuromuscular disease CVA    GI/Hepatic GERD  ,  Endo/Other    Renal/GU Renal InsufficiencyRenal disease     Musculoskeletal  (+) Arthritis , Severe torticollis.  Head tilted to the left.   Abdominal   Peds  Hematology  (+) Blood dyscrasia, anemia ,   Anesthesia Other Findings   Reproductive/Obstetrics                             Anesthesia Physical Anesthesia Plan  ASA: IV  Anesthesia Plan: General   Post-op Pain Management:    Induction: Intravenous  Airway Management Planned: Video Laryngoscope Planned  Additional Equipment:   Intra-op Plan:   Post-operative Plan: Possible Post-op intubation/ventilation  Informed Consent: I have reviewed the patients History and Physical, chart, labs and discussed the procedure including the risks, benefits and alternatives for the proposed anesthesia with the patient or authorized representative who has indicated his/her understanding and acceptance.     Plan Discussed with: CRNA, Anesthesiologist and Surgeon  Anesthesia Plan Comments:         Anesthesia Quick Evaluation

## 2016-09-13 NOTE — Anesthesia Procedure Notes (Signed)
Procedure Name: Intubation Date/Time: 09/13/2016 11:14 AM Performed by: Elyza Whitt, Virgel Gess Pre-anesthesia Checklist: Timeout performed, Patient being monitored, Suction available, Emergency Drugs available and Patient identified Patient Re-evaluated:Patient Re-evaluated prior to inductionOxygen Delivery Method: Circle system utilized Preoxygenation: Pre-oxygenation with 100% oxygen Intubation Type: IV induction Ventilation: Mask ventilation without difficulty Grade View: Grade I Tube type: Subglottic suction tube Tube size: 7.0 mm Number of attempts: 1 Airway Equipment and Method: Video-laryngoscopy and Rigid stylet Placement Confirmation: ETT inserted through vocal cords under direct vision,  positive ETCO2,  CO2 detector and breath sounds checked- equal and bilateral Secured at: 23 cm Tube secured with: Tape Dental Injury: Teeth and Oropharynx as per pre-operative assessment  Difficulty Due To: Difficulty was anticipated and Difficult Airway- due to reduced neck mobility Future Recommendations: Recommend- induction with short-acting agent, and alternative techniques readily available Comments: Easy mask with etomidate, DL x1 with Glidescope 3, Grade 1 view, easy pass of subglottic tube.

## 2016-09-13 NOTE — Brief Op Note (Signed)
09/09/2016 - 09/13/2016  2:38 PM  PATIENT:  Eduardo Deleon  83 y.o. male  PRE-OPERATIVE DIAGNOSIS:  Recurrent Sigmoid vovulus  POST-OPERATIVE DIAGNOSIS:  Same, Right lower quadrant Spigelian hernia  PROCEDURE:  Procedure(s): LAPAROSCOPIC ASSISTED SIGMOID COLECTOMY WITH COLOSTOMY (HARTMANN PROCEDURE) LAPAROSCOPIC REPAIR OF SPIGELIAN HERNIA (N/A)  SURGEON:  Surgeon(s) and Role: Panel 1:    * Greer Pickerel, MD - Primary  Panel 2:    * Greer Pickerel, MD - Primary  PHYSICIAN ASSISTANT:   ASSISTANTS: none   ANESTHESIA:   general  EBL:  Total I/O In: 1750 [I.V.:1500; IV Piggyback:250] Out: 325 [Urine:300; Blood:25]  BLOOD ADMINISTERED:none  DRAINS: Nasogastric Tube and Urinary Catheter (Foley)   LOCAL MEDICATIONS USED:  OTHER exparel  SPECIMEN:  Source of Specimen:  sigmoid colon - stitch distal  DISPOSITION OF SPECIMEN:  PATHOLOGY  COUNTS:  YES  TOURNIQUET:  * No tourniquets in log *  DICTATION: .Other Dictation: Dictation Number [id number]  PLAN OF CARE: icu  PATIENT DISPOSITION:  ICU - intubated and hemodynamically stable.   Delay start of Pharmacological VTE agent (>24hrs) due to surgical blood loss or risk of bleeding: no

## 2016-09-13 NOTE — Anesthesia Postprocedure Evaluation (Addendum)
Anesthesia Post Note  Patient: Eduardo Deleon  Procedure(s) Performed: Procedure(s) (LRB): POSSIBLE LAPAROSCOPIC ASSISTED COLON RESECTION SIGMOID WITH COLOSTOMY (N/A) SPIGELIAN HERNIA (N/A)  Patient location during evaluation: SICU Anesthesia Type: General Level of consciousness: sedated Pain management: pain level controlled Vital Signs Assessment: post-procedure vital signs reviewed and stable Respiratory status: patient remains intubated per anesthesia plan Cardiovascular status: stable Anesthetic complications: no       Last Vitals:  Vitals:   09/13/16 1450 09/13/16 1500  BP: 136/84 128/68  Pulse:    Resp:  15  Temp:      Last Pain:  Vitals:   09/13/16 0830  TempSrc:   PainSc: Asleep                 Eduardo Deleon S

## 2016-09-13 NOTE — Consult Note (Signed)
[place] Nurse requested for preoperative stoma site marking  Discussed surgical procedure and stoma creation with family member (godson).  Explained role of the [place] nurse team and answered questions.  He does not live in town, but was going to go over to see his godmother (patient's wife) at the SNF in which they live to tell her he had seen her husband prior to surgery. He reports making this decision so that the patient can eat. Patient is extremely hard of hearing and does not seem to be comprehending my abbreviated introduction of explanation of the procedure I was about to perform.  Examined patient lying with HOB elevated to 45% angle as that is all he can tolerate this morning.  He is using accessory muscles for air exchange. Although the patient is not likely to be performing self-care, it will be important order to place the ostomy away from any creases or abdominal contour issues and within the rectus muscle for ease in care giving and for economic use of supplies.  There are numerous creases in the lower quadrants of the abdomen, so a site in the LUQ is selected.  Marked for colostomy in the LLQ  5.5cm to the left of the umbilicus and 2cm above  the umbilicus.  Patient's abdomen cleansed with CHG wipes at site markings, allowed to air dry prior to marking. Covered mark with thin film transparent dressing to preserve mark until case begins.   [place] nursing team will follow, and will remain available to this patient, the nursing, surgical and medical teams.   Thanks, Maudie Flakes, MSN, RN, [place], Arther Abbott  Pager# [phone]

## 2016-09-13 NOTE — Consult Note (Signed)
PROGRESS NOTE NOT CONSULT AS LISTED ABOVE  Name: Eduardo Deleon MRN: [id number] DOB: 12-03-1924    ADMISSION DATE:  09/09/2016 CONSULTATION DATE: 3/20  REFERRING MD : [name]   CHIEF COMPLAINT:  Pre-op pulmonary/critical care risk assessment   BRIEF PATIENT DESCRIPTION:  This is a 83 year old male who resides at a SNF. He has a sig medical h/o AF, prior CVA, severe torticollis, mild dementia, BPH w/ slow growing prostate cancer, Chronic diastolic HF, chronic aspiration, CKD stage I and non-healing left ankle ulcer. Presented to ER 3/18 w/ abd pain and vomiting. Found to have recurrent sigmoid volvulus. He was seen by GI medicine w/ unsuccessful decompression attempt & it was felt that it could not be flipped endoscopically. He was seen by surgery who feels that surgical management would require permanent colostomy. Given his multiple medical co-morbids PCCM was asked to see and comment on pulmonary risk.    SIGNIFICANT EVENTS  OR for sigmoid resection and colostomy 3/22. Returned to ICU on vent   STUDIES:  3/18 CT abd: Recurrent distal sigmoid colon volvulus resulting in upstream colonic obstruction.   SUBJECTIVE:  Sedated on vent  VITAL SIGNS: Temp:  [97.8 F (36.6 C)-98.5 F (36.9 C)] 97.8 F (36.6 C) (03/22 0600) Pulse Rate:  [82-96] 96 (03/22 0600) Resp:  [15-20] 15 (03/22 1500) BP: (115-147)/(67-84) 128/68 (03/22 1500) SpO2:  [97 %-100 %] 100 % (03/22 1500) FiO2 (%):  [100 %] 100 % (03/22 1450)   Intake/Output Summary (Last 24 hours) at 09/13/16 1556 Last data filed at 09/13/16 1450  Gross per 24 hour  Intake             3461 ml  Output              325 ml  Net             3136 ml   General appearance:  Chronically ill appearing 83 Year old  Male cachectic sedated on vent Eyes: anicteric sclerae, moist conjunctivae; PERRL, EOMI bilaterally. Mouth:  membranes and no mucosal ulcerations; normal hard and soft palate, orally intubated Neck: Trachea midline; neck  supple, no JVD severe torticollis  Lungs/chest: diffuse rhonchi, with normal respiratory effort and no intercostal retractions CV: RRR, no MRGs  Abdomen: Soft, colostomy is pink, mid abd incision is CD&I Extremities: No peripheral edema or extremity lymphadenopathy Skin: Normal temperature, turgor and texture; no rash, ulcers or subcutaneous nodules Neuro/ Psych: Appropriate affect, alert and oriented to person, place and time  CBC Recent Labs     09/11/16  0403  09/12/16  0506  09/13/16  0533  WBC  4.6  4.8  4.7  HGB  10.2*  10.5*  10.2*  HCT  32.2*  32.7*  32.3*  PLT  119*  118*  106*    Coag's Recent Labs     09/12/16  0506  09/12/16  1605  09/12/16  2359  APTT  63*  81*  83*    BMET Recent Labs     09/11/16  0403  09/12/16  0506  09/13/16  0533  NA  143  145  148*  K  3.1*  3.9  4.1  CL  112*  115*  119*  CO2  23  19*  16*  BUN  19  17  14   CREATININE  0.81  0.77  0.86  GLUCOSE  74  76  94    Electrolytes Recent Labs     09/11/16  0403  09/12/16  0506  09/13/16  0533  CALCIUM  8.2*  8.4*  8.6*  MG   --   2.1   --     Sepsis Markers No results for input(s): PROCALCITON, O2SATVEN in the last 72 hours.  Invalid input(s): LACTICACIDVEN  ABG No results for input(s): PHART, PCO2ART, PO2ART in the last 72 hours.  Liver Enzymes Recent Labs     09/11/16  0403  09/13/16  0533  AST  23  44*  ALT  12*  22  ALKPHOS  69  66  BILITOT  1.4*  1.7*  ALBUMIN  3.2*  3.4*    Cardiac Enzymes No results for input(s): TROPONINI, PROBNP in the last 72 hours.  Glucose No results for input(s): GLUCAP in the last 72 hours.  Imaging Dg Abd 2 Views  Result Date: 09/12/2016 CLINICAL DATA:  Nausea, vomiting and abdominal pain. Recurrent volvulus. EXAM: ABDOMEN - 2 VIEW COMPARISON:  09/11/2016 and CT 09/10/2015 FINDINGS: Again noted is a large gas-filled loop of bowel in the right abdomen that is most compatible with dilated sigmoid colon. No significant gas in  the rectum. Bowel gas pattern remains compatible with a sigmoid volvulus and similar to the previous examination. No evidence for free air on the left lateral decubitus image. Again noted is levoscoliosis of the thoracolumbar spine. Multiple phleboliths in the pelvis. IMPRESSION: Distended bowel in the abdomen and the configuration may remains compatible with a sigmoid volvulus. Minimal change from the recent comparison examination. No evidence for free air. Electronically Signed   By: Markus Daft M.D.   On: 09/12/2016 10:39     ASSESSMENT / PLAN:  Sigmoid volvulus now s/p sigmoid colectomy w/ colostomy Plan NPO Post op care per surg svc  Ventilator dependence  Chronic aspiration (CT abd showing limited field of chest was clear) Torticollis.  -left intubated post op d/t residual anesthetics. Anticipated difficult airway although anesthesia note states grade I airway Plan CXR and abg PAD protocol for RAS 0 to -1 SBT in am  Chronic dysphagia Plan Cont asp precautions after extubation  Hypernatremia  Hyperchloremic acidosis  Plan Change IVF to LR f/u am chemistry Strict I&O  CAF w/ CVR Plan Hep to resume at 10 p CCB as needed Tele  h/o prostate cancer; CRI stage I Plan Trend lytes Cont gentle IVFs  GERD Plan PPI  Mild thrombocytopenia Plan f/u cbc in am   Anemia of chronic disease.  Plan f/u am cbc Heparin w/out bolus Transfuse per protocol   h/o CVA w/ Deconditioning Dementia  And very hard of hearing  Plan PT and OT as indicated Back to SNF after dc   Discussion Appears comfortable. Will get ABG and CXR. Rest over night. Anticipate SBT in am.   My ccm time 32 minutes  Erick Colace ACNP-BC [place] Pager # [phone] OR # [phone] if no answer  09/13/2016, 3:45 PM    Attending Note:  I have examined patient, reviewed labs, studies and notes. I have discussed the case with Eduardo Deleon, and I agree with the data and plans as  amended above. [name] with severe torticollis, hx dysphagia, A Fib, prior CVA. He underwent sigmoid colectomy + colostomy today for volvulus and obstruction. He returned to the ICU still ventilated, ETT in place. Per anesthesia notes, grade 1 view of airway. On my eval he is sedated and comfortable. Severe leftward neck flexion / torticollis, lungs clear. abd wound and colostomy CDI. His CXR shows  that the ETT tip is right at the carina. We will withdraw the ETT 1cm, plan to keep him sedated and ventilated overnight. Plan for decreased sedation and SBT in am 3/23 with goal extubation. We will want to optimize his status to avoid any complications given potential impact of his torticollis on resp status. Would have difficult airway equipment available at that time.  Independent critical care time is 40 minutes.   [name], MD, PhD 09/13/2016, 4:54 PM [facility] [phone] or if no answer [phone]

## 2016-09-13 NOTE — Interval H&P Note (Signed)
History and Physical Interval Note:  09/13/2016 9:45 AM  Eduardo Deleon  has presented today for surgery, with the diagnosis of sigmoid obsruction  The various methods of treatment have been discussed with the patient and family. After consideration of risks, benefits and other options for treatment, the patient has consented to  Procedure(s): POSSIBLE LAPAROSCOPIC ASSISTED COLON RESECTION SIGMOID WITH COLOSTOMY (N/A) TRACHEOSTOMY (N/A) as a surgical intervention .  The patient's history has been reviewed, patient examined, no change in status, stable for surgery.  I have reviewed the patient's chart and labs.  Questions were answered to the patient's satisfaction.    [name]. [name], MD, [place], Bariatric, & Minimally Invasive Surgery [facility], [place]    [facility] M

## 2016-09-13 NOTE — Progress Notes (Signed)
[facility] Progress Note Patient Name: Eduardo Deleon DOB: 11-Jan-1925 MRN: [id number]   Date of Service  09/13/2016  HPI/Events of Note  abgs with med acidosis with inadequate resp comp  eICU Interventions  Increase vent Rate to 18      Intervention Category Major Interventions: Respiratory failure - evaluation and management  Christinia Gully 09/13/2016, 5:07 PM

## 2016-09-13 NOTE — Progress Notes (Signed)
Pt came from OR on vent.

## 2016-09-13 NOTE — Progress Notes (Signed)
Pharmacy: Re- heparin   Patient's an 83 y.o M on heparin drip (while apixaban is on hold) for afib with plan for colectomy on 3/22. CCS ordered for heparin to be off at 0300 on 3/22 in anticipation for OR. APTT now back therapeutic at 83 (goal 66-102 secs). HL=1.38 (still elevated form apixaban)  No bleeding documented.   Plan: - continue current heparin drip at 700 units/hr -Daily aptt and HL- covert to only HL when labs correlate - d/c heparin drip at 0300 on 3/22 per Dr. Algis Liming, Gutierrez R 1:44 AM 09/13/2016

## 2016-09-13 NOTE — Progress Notes (Signed)
Patient to go to OR soon this am Responds when name is called RN reports that HCPOA God son is now in town, though not currently at the bedside this am BP 115/67 (BP Location: Right Arm)   Pulse 96   Temp 97.8 F (36.6 C) (Axillary)   Resp 20   Ht 5\' 6"  (1.676 m)   Wt 70.8 kg (156 lb 1.4 oz)   SpO2 97%   BMI 25.19 kg/m  NAD Severe torticollis Shallow clear breath sounds Abdomen distended No edema Labs and imaging noted.  A/P: Recurrent volvulus To go for surgery today We will continue conversations with HCPOA agent, based on the patient's disease trajectory in the post op period 15 minutes spent  [place] team [phone]

## 2016-09-13 NOTE — Progress Notes (Addendum)
May resume hep gtt tonight at 10pm - no bolus pls May be extubated when CCM thinks safe but needs to be in daytime with anesthesia/glide scope at [facility] in case fails.  Spoke with POA Mr Lucy Antigua after sugery. He reconfirms pt is DNR. Pt to remain on a medical service as the attending.   [name]. [name], MD, FACS General, Bariatric, & Minimally Invasive Surgery [facility], [place]

## 2016-09-13 NOTE — H&P (View-Only) (Signed)
3 Days Post-Op  Subjective: Nurse says he has diarrhea all over the bed multiple times.  He is still very distended.  BS hyperactive.  He want to know if we are going to work.  I am not sure exactly how much he understands.    Objective: Vital signs in last 24 hours: Temp:  [97.6 F (36.4 C)-98.1 F (36.7 C)] 97.6 F (36.4 C) (03/21 0643) Pulse Rate:  [83-120] 83 (03/21 0643) Resp:  [22-27] 24 (03/20 2209) BP: (131-138)/(67-93) 132/93 (03/21 0643) SpO2:  [88 %-99 %] 98 % (03/21 0643) Weight:  [70.8 kg (156 lb 1.4 oz)] 70.8 kg (156 lb 1.4 oz) (03/20 2209) Last BM Date: 09/11/16 1679 IV Urine x 6 Stool x 9 yesterday and 2 day prior 09/10/16 Afebrile, VSS  RR stays up in the 20's most of the time. Last K+ yesterday 3.1  - will recheck this AM 1 view abd xray 3/20:  Persistent abnormal bowel gas pattern with gas distended (measuring up to 9 cm as on prior exam) superiorly extending sigmoid colon which may reflect changes of sigmoid volvulus.  Intake/Output from previous day: 03/20 0701 - 03/21 0700 In: 1679 [I.V.:1679] Out: -  Intake/Output this shift: No intake/output data recorded.  General appearance: alert, cooperative and no distress GI: distended and non tender, BS hyperactive  No pain or discomfort on exam.  Lab Results:   Recent Labs  09/11/16 0403 09/12/16 0506  WBC 4.6 4.8  HGB 10.2* 10.5*  HCT 32.2* 32.7*  PLT 119* 118*    BMET  Recent Labs  09/10/16 0331 09/11/16 0403  NA 141 143  K 3.5 3.1*  CL 108 112*  CO2 27 23  GLUCOSE 98 74  BUN 20 19  CREATININE 0.86 0.81  CALCIUM 8.2* 8.2*   PT/INR  Recent Labs  09/10/16 0331  LABPROT 21.2*  INR 1.80     Recent Labs Lab 09/09/16 1232 09/10/16 0331 09/11/16 0403  AST 18 16 23   ALT 12* 10* 12*  ALKPHOS 68 61 69  BILITOT 1.1 1.1 1.4*  PROT 6.7 5.9* 5.8*  ALBUMIN 3.8 3.3* 3.2*     Lipase     Component Value Date/Time   LIPASE 20 09/09/2016 1232     Studies/Results: Dg Abd Portable  1v  Result Date: 09/11/2016 CLINICAL DATA:  83 year old male with abdominal distention. Subsequent encounter. EXAM: PORTABLE ABDOMEN - 1 VIEW COMPARISON:  09/10/2016 plain film exam.  09/09/2016 CT. FINDINGS: Persistent abnormal bowel gas pattern with gas distended (measuring up to 9 cm as on prior exam) superiorly extending sigmoid colon which may reflect changes of sigmoid volvulus. The possibility of free intraperitoneal air cannot be assessed on a supine view. Scoliosis convex left.  Hip degenerative change greater on left. IMPRESSION: Findings consistent with sigmoid volvulus similar to prior exam as noted above. Electronically Signed   By: Genia Del M.D.   On: 09/11/2016 09:46   Dg Abd Portable 1v  Result Date: 09/10/2016 CLINICAL DATA:  Sigmoid volvulus EXAM: PORTABLE ABDOMEN - 1 VIEW COMPARISON:  09/09/2016 FINDINGS: Persistent gaseous distension of the sigmoid colon with paucity of bowel gas within pelvis highly suspicious for recurrent sigmoid colon volvulus. IMPRESSION: Persistent gaseous distension of the sigmoid colon with paucity of bowel gas within pelvis highly suspicious for recurrent sigmoid colon volvulus. Electronically Signed   By: Lahoma Crocker M.D.   On: 09/10/2016 17:25    Medications: . Chlorhexidine Gluconate Cloth  6 each Topical Q0600  . doxazosin  2  mg Oral QHS  . finasteride  5 mg Oral Daily  . mupirocin ointment  1 application Nasal BID  . sodium chloride flush  3 mL Intravenous Q12H   . diltiazem (CARDIZEM) infusion    . heparin 700 Units/hr (09/12/16 0747)  . sodium chloride 0.9 % 1,000 mL with potassium chloride 40 mEq infusion 75 mL/hr at 09/11/16 2248   Assessment/Plan Recurrent sigmoid volvulus. Atrial fibrillation on Eliquis/last dose 08/13/16 - heparin drip Severe hearing deficit Hx of CVA History of congestive heart failure History of chronic renal insufficiency Severe torticollis History of prostate cancer - slow  growing Osteoarthritis GERD DNR  FEN:IV fluids/K+ being replaced/NPO x sips ID:  No abx DVT: Heparin drip   Plan:  Recheck film, K+ and mag+.  Keep him NPO for now.  Anesthesia recommends General surgery or ENT be available for trach if there are issues with intubation.  Moderate to high risk per CCM and Pulmonary but nothing that can be done to reduce risk  prior to surgery.    LOS: 3 days    Breia Ocampo 09/12/2016 [phone]

## 2016-09-13 NOTE — Progress Notes (Signed)
CSW spoke with pt's HCPOA this afternoon to answer questions. Pt shares a room with spouse at SNF. Pt is not in [facility]. Pt will return to SNF bed when stable. Bed is not being held by pt but SNF will keep bed available as long as possible and alert CSW with any changes.  CSW will continue to follow to assist with d/c planning needs.  Werner Lean LCSW [phone]

## 2016-09-13 NOTE — Op Note (Signed)
NAMEJOVONI, BORKENHAGEN NO.:  [id number]  MEDICAL RECORD NO.:  [id number]  LOCATION:                                 FACILITY:  PHYSICIAN:  [name]. [name], MD, FACSDATE OF BIRTH:  12-Jan-1925  DATE OF PROCEDURE:  09/13/2016 DATE OF DISCHARGE:                              OPERATIVE REPORT   PREOPERATIVE DIAGNOSIS:  Recurrent sigmoid volvulus.  POSTOPERATIVE DIAGNOSES: 1. Recurrent sigmoid volvulus. 2. Right lower quadrant spigelian hernia.  PROCEDURES: 1. Laparoscopic-assisted sigmoid colectomy with end-colostomy,     Hartmann's procedure. 2. Laparoscopic repair of spigelian hernia primarily.  SURGEON:  [name]. [name], MD, FACS.  ASSISTANT SURGEON:  None.  ESTIMATED BLOOD LOSS:  25 mL.  SPECIMEN:  Sigmoid colon stitch marks distal.  INDICATIONS FOR PROCEDURE:  The patient is an 83 year old gentleman, who has had multiple issues with sigmoid volvulus.  He presented this past week with recurrent sigmoid volvulus that was unable to be decompressed with the colonoscope.  At that point, General Surgery was consulted. The patient has advanced age, severe torticollis, atrial fibrillation, and so therefore multiple service lines were consulted to help assess his operative risks since colonic decompression was no longer working plus the patient had multiple episodes of volvulus requiring decompression recently.  The patient was oriented x3 at times, but we did question his ability to fully comprehend.  After several days of initial conversation, he did agree to a colostomy.  I did not think a primary anastomosis was indicated given his protein-calorie malnutrition and ambulatory status.  Therefore, I thought he would be best served by a permanent end colostomy.  I had a prolonged discussion with his power of attorney via telephone after Anesthesia, Cardiology, Critical Care Medicine, and Palliative Care had evaluated the patient.  General consensus and agreement  were to proceed with the operation with the understanding of the risks and potential complications.  Should the patient have multiple complications or protracted hospital course, then the power of attorney would like to switch to comfort care measures at that time in point.  Please see the chart for details regarding the extensive discussion regarding risks and benefits of the procedure.  DESCRIPTION OF PROCEDURE:  After obtaining informed consent from the power of attorney, the patient was taken to operating room 4 at [facility], placed supine on the operating table.  Dr. Erik Obey with ENT was present in the operating room for induction just in case Anesthesia was not able to have an established airway and full tracheostomy set was open and ready to go should be needed.  However, the patient was intubated with a GlideScope relatively straightforward. We then placed sequential compression devices and Foley catheter.  The patient's arms had been tucked at his side.  The abdomen was prepped and draped in the usual standard surgical fashion with ChloraPrep.  The wound ostomy nurse had marked the patient preoperatively.  His colostomy mark was started in the left upper mid abdomen.  A surgical time-out was performed.  He received cefotetan prior to skin incision.  A small incision was made in the infraumbilical position.  The fascia was grasped and lifted anteriorly.  Next, the fascia was entered  and a pursestring suture was placed around the fascial edges and a 12 mm Hasson trocar was placed.  Pneumoperitoneum was smoothly established to a patient pressure of 15 mmHg.  The laparoscope was advanced and the abdominal cavity was surveilled.  There was evidence of a long tube of sigmoid colon that was very dilated.  There was evidence of a 2 cm spigelian hernia in the right lower quadrant.  It had no contents within it, but there was clearly a fascial defect in the typical location of  a spigelian hernia.  A 5 mm trocar was placed in the right upper quadrant and I placed another 5 mm trocar in the right lower quadrant through the spigelian defect.  The patient was then placed in Trendelenburg position and rotated to the right.  Even though he had a very redundant Sigmoid colon, there were still some lateral attachments of the descending colon, which were taken down with Harmonic Scalpel.  I incised the white line of Toldt and medialized the descending colon medially.  The descending colon was of normal caliber, then it came back up and dilated and went back down into his pelvis.  His sigmoid colon was fairly dilated all the way down to the peritoneal reflection.  I did not feel we needed to go that far down to divide.  I ended up mobilizing the remaining part of the sigmoid colon that had lateral peritoneal attachments.  It felt like I had enough mobility above the area of the volvulus to reach the colostomy site that had been marked by the wound nurse.  At this point, the infraumbilical trocar incision was slightly extended and the fascia was incised.  The wound protector was placed and the sigmoid colon was eviscerated.  I identified a segment of normal colon just in the descending colon and created a window and divided that colon with a GIA 75 stapler with a blue load.  I then took down the mesentery in a sequential fashion, staying quite close to the wall of the colon with the LigaSure device.  I decided to transect the sigmoid colon where the tenia coalesced into the upper rectum.  However, the colon in the rectosigmoid junction was still fairly dilated and this distention pretty much went all the way down to the peritoneal reflection, but I did not feel we needed to go that far down.  I ended up dividing at the rectosigmoid junction with the linear cutter 100 mm stapler.  This completely freed the specimen.  It was labeled as described above.  I then reinforced  the rectal stump staple line with interrupted 3-0 silk sutures.  The proximal colon of the end-colostomy was inspected.  It had great blood supply.  It appeared pink and healthy.  It easily reached up to the colostomy site.  At this point, I reestablished pneumoperitoneum by placing a GelPort on top of the wound protector.  I removed the right lower quadrant 5 mm trocar and reapproximated the fascial defect at the spigelian hernia with 3 interrupted 0 Novafil ties using the PMI suture passer with laparoscopic assistance.  I then incised the skin in the left mid abdomen where it had been previously marked with a 15 blade and then using [employer]-Nav[employer] retractors, got down to the anterior fascia, incised it vertically, then gently spread the rectus muscles with a Kelly, and then incised the posterior sheath vertically with electrocautery ensuring that there was no damage to intraabdominal contents.  I had placed a sponge  in the abdomen directly behind this location.  At this point, I brought out the descending colon, ensuring that it was not twisted.  The sponge was removed from the abdomen.  The wound protector was removed.  There was no untoward tension on the colostomy.  I then reapproximated the small infraumbilical fascial incision with interrupted 0 Novafil sutures.  I then took one last look laparoscopically by reestablishing pneumoperitoneum.  Exparel was infiltrated around the fascial incision and around the right lower quadrant spigelian hernia repair and in the left upper abdomen in the preperitoneal space.  It should be noted that I had previously irrigated the pelvis prior to closing the fascia.  At this point, pneumoperitoneum was released.  The remaining 5 mm trocar was removed.  The subcutaneous tissue in the mini midline incision was irrigated and reapproximated with skin staples along with the 2 trocar sites on the right side of the abdomen.  Sterile dressings were then applied.   These incisions were then covered with blue towels.  I then excised the staple line and matured the colostomy.  I did place four 3-0 Vicryl sutures in a Brooke fashion in the superior, inferior, left and right lateral aspects of the colostomy.  I then placed 2 additional 3-0 Vicryl sutures in each quadrant.  The colostomy was patent and well vascularized.  The stoma device was applied.  The patient was left intubated and taken to the ICU in stable condition.  All needle, instrument, and sponge counts were correct x2.  There were no immediate complications.  The patient tolerated the procedure well.  I did discuss the operative findings with the power of attorney via phone.     [name]. [name], MD, FACS     EMW/MEDQ  D:  09/13/2016  T:  09/13/2016  Job:  [id number]

## 2016-09-13 NOTE — Progress Notes (Signed)
Pharmacy: Re- heparin 83 y.o M on heparin drip (while apixaban is on hold) for afib.  s/p  colectomy 3/22. Heparin stopped at 0300 this morning 3/22 in anticipation for OR. APTT  therapeutic that 83 last night (goal 66-102 secs) on 700 units/hr. HL=1.38 (still elevated form apixaban)  No bleeding documented. Hg stable at 10.2, pltc remains low at 106.  Dr [name] wants to resume heparin drip tonight at 2200 with no bolus.  Plan: - resume heparin drip at 700 units/hr with no bolus tonight at 2200 and check aPTT in 8 hrs at 0600 am (will also check HL to evaluate correlation) -Daily aptt and HL- covert to only HL when labs correlate  Eudelia Bunch, Pharm.D. [phone] 09/13/2016 2:55 PM

## 2016-09-13 NOTE — Transfer of Care (Signed)
Immediate Anesthesia Transfer of Care Note  Patient: Eduardo Deleon  Procedure(s) Performed: Procedure(s): POSSIBLE LAPAROSCOPIC ASSISTED COLON RESECTION SIGMOID WITH COLOSTOMY (N/A) SPIGELIAN HERNIA (N/A)  Patient Location: PACU and ICU  Anesthesia Type:General  Level of Consciousness: sedated  Airway & Oxygen Therapy: Patient remains intubated per anesthesia plan and Patient placed on Ventilator (see vital sign flow sheet for setting)  Post-op Assessment: Report given to RN  Post vital signs: Reviewed and stable  Last Vitals:  Vitals:   09/12/16 2027 09/13/16 0600  BP: (!) 147/84 115/67  Pulse: 82 96  Resp: 18 20  Temp: 36.9 C 36.6 C    Last Pain:  Vitals:   09/13/16 0830  TempSrc:   PainSc: Asleep         Complications: No apparent anesthesia complications

## 2016-09-13 NOTE — Progress Notes (Signed)
Date:  September 13, 2016 Spoke with the God Son and POA/wanted to know what the next steps were for the patient as far as [facility] is concerned.  He is currently in the same room with the wife but may need a higher level of care.  Referred God [facility] to [facility] the [place] and texted Roselyn Reef the [place] phone number to call and texted the csw phone number to the [place]. Velva Harman, BSN, [employer], [place]

## 2016-09-13 NOTE — Progress Notes (Signed)
PROGRESS NOTE  Eduardo Deleon DJT:[id number] DOB: 12-04-1924 DOA: 09/09/2016 PCP: Pcp Not In System  HPI/Recap of past 63 hours: 83 year old male with past medical history of atrial fibrillation status post cardioversion on chronic anticoagulation, severe torticollis, chronic diastolic heart failure , dementia with secondary aspiration and recurrent sigmoid volvulus admitted 3/18 for abdominal pain and found to again have recurrent sigmoid volvulus. Patient has had previous surgeries in the past and at times has refused. General surgery was consulted and after discussion with cardiology and palliative care discussion with patient's son who is his healthcare power of attorney, plan will be to attempt surgery with understanding that he is of some mortality risk if he is not able to be extubated, then he will be made comfort care.  Patient taken for surgery on morning of 3/22. Seen prior to going downstairs.  Assessment/Plan: Active Problems:   Volvulus [facility]): Surgery pending. Seen by cardiology and generally cleared, although he does remain at risk. Dementia: Stable, baseline. Severe torticollis: Patient actually was successfully intubated in 2015 despite issues.  atrial fibrillation on chronic anticoagulation: Chads score is 5. Eliquis on hold. Patient currently on IV heparin. Cardizem added for tachycardia Chronic diastolic heart failure: Currently stable    Abdominal pain   Abnormal CT of the abdomen   Preoperative cardiovascular examination   SBO (small bowel obstruction)   Code Status: DO NOT RESUSCITATE   Family Communication: Left message for son yesterday who lives in [place]   Disposition Plan: Surgery, outcome depending on how he does    Consultants:  General surgery  Cardiology  Palliative care   Procedures:  3/22:Planned abdominal surgery for volvulus   Antimicrobials:  IV Ancef ordered for surgery   DVT prophylaxis: On IV heparin   Objective: Vitals:    09/12/16 0643 09/12/16 1502 09/12/16 2027 09/13/16 0600  BP: (!) 132/93 124/84 (!) 147/84 115/67  Pulse: 83 92 82 96  Resp:  19 18 20   Temp: 97.6 F (36.4 C) 97.8 F (36.6 C) 98.5 F (36.9 C) 97.8 F (36.6 C)  TempSrc: Oral Oral Axillary Axillary  SpO2: 98% 99% 97% 97%  Weight:      Height:        Intake/Output Summary (Last 24 hours) at 09/13/16 1147 Last data filed at 09/13/16 0600  Gross per 24 hour  Intake           1048.5 ml  Output                0 ml  Net           1048.5 ml   Filed Weights   09/09/16 1920 09/11/16 2209  Weight: 66.5 kg (146 lb 9.7 oz) 70.8 kg (156 lb 1.4 oz)    Exam:   General:  Chronic, Confused, noted torticollis   Cardiovascular: Irregular   Respiratory: Clear to auscultation bilaterally   Abdomen: Soft, nondistended, positive bowel sounds   Musculoskeletal: No clubbing or cyanosis, trace edema   Psychiatry: Chronic dementia, otherwise no acute psychosis    Data Reviewed: CBC:  Recent Labs Lab 09/09/16 1232 09/10/16 0331 09/11/16 0403 09/12/16 0506 09/13/16 0533  WBC 9.0 6.8 4.6 4.8 4.7  NEUTROABS  --   --  3.5  --   --   HGB 11.1* 10.7* 10.2* 10.5* 10.2*  HCT 34.5* 32.8* 32.2* 32.7* 32.3*  MCV 89.8 89.9 89.4 91.9 93.4  PLT 140* PLATELET CLUMPS NOTED ON SMEAR, UNABLE TO ESTIMATE 119* 118* 106*   Basic  Metabolic Panel:  Recent Labs Lab 09/09/16 1232 09/10/16 0331 09/11/16 0403 09/12/16 0506 09/13/16 0533  NA 142 141 143 145 148*  K 3.8 3.5 3.1* 3.9 4.1  CL 110 108 112* 115* 119*  CO2 27 27 23  19* 16*  GLUCOSE 126* 98 74 76 94  BUN 24* 20 19 17 14   CREATININE 0.91 0.86 0.81 0.77 0.86  CALCIUM 8.9 8.2* 8.2* 8.4* 8.6*  MG  --   --   --  2.1  --    GFR: Estimated Creatinine Clearance: 52.5 mL/min (by C-G formula based on SCr of 0.86 mg/dL). Liver Function Tests:  Recent Labs Lab 09/09/16 1232 09/10/16 0331 09/11/16 0403 09/13/16 0533  AST 18 16 23  44*  ALT 12* 10* 12* 22  ALKPHOS 68 61 69 66  BILITOT  1.1 1.1 1.4* 1.7*  PROT 6.7 5.9* 5.8* 6.2*  ALBUMIN 3.8 3.3* 3.2* 3.4*    Recent Labs Lab 09/09/16 1232  LIPASE 20   No results for input(s): AMMONIA in the last 168 hours. Coagulation Profile:  Recent Labs Lab 09/10/16 0331  INR 1.80   Cardiac Enzymes: No results for input(s): CKTOTAL, CKMB, CKMBINDEX, TROPONINI in the last 168 hours. BNP (last 3 results) No results for input(s): PROBNP in the last 8760 hours. HbA1C: No results for input(s): HGBA1C in the last 72 hours. CBG: No results for input(s): GLUCAP in the last 168 hours. Lipid Profile: No results for input(s): CHOL, HDL, LDLCALC, TRIG, CHOLHDL, LDLDIRECT in the last 72 hours. Thyroid Function Tests: No results for input(s): TSH, T4TOTAL, FREET4, T3FREE, THYROIDAB in the last 72 hours. Anemia Panel: No results for input(s): VITAMINB12, FOLATE, FERRITIN, TIBC, IRON, RETICCTPCT in the last 72 hours. Urine analysis:    Component Value Date/Time   COLORURINE YELLOW 08/15/2016 2145   APPEARANCEUR CLEAR 08/15/2016 2145   LABSPEC 1.008 08/15/2016 2145   PHURINE 7.0 08/15/2016 2145   GLUCOSEU NEGATIVE 08/15/2016 2145   HGBUR NEGATIVE 08/15/2016 2145   BILIRUBINUR NEGATIVE 08/15/2016 2145   [place] NEGATIVE 08/15/2016 2145   PROTEINUR NEGATIVE 08/15/2016 2145   UROBILINOGEN 1.0 03/29/2014 1609   NITRITE POSITIVE (A) 08/15/2016 2145   LEUKOCYTESUR LARGE (A) 08/15/2016 2145   Sepsis Labs: @LABRCNTIP (procalcitonin:4,lacticidven:4)  )No results found for this or any previous visit (from the past 240 hour(s)).    Studies: No results found.  Scheduled Meds: . bupivacaine liposome  20 mL Infiltration Once  . [MAR Hold] Chlorhexidine Gluconate Cloth  6 each Topical Q0600  . [MAR Hold] doxazosin  2 mg Oral QHS  . [MAR Hold] finasteride  5 mg Oral Daily  . [MAR Hold] mupirocin ointment  1 application Nasal BID  . [MAR Hold] sodium chloride flush  3 mL Intravenous Q12H    Continuous Infusions: . diltiazem  (CARDIZEM) infusion 10 mg/hr (09/13/16 1038)  . sodium chloride 0.9 % 1,000 mL with potassium chloride 40 mEq infusion 75 mL/hr at 09/13/16 0658     LOS: 4 days     Annita Brod, MD [facility] Pager [phone]  If 7PM-7AM, please contact night-coverage [email or web address] Password [email or web address] 09/13/2016, 11:47 AM

## 2016-09-14 ENCOUNTER — Inpatient Hospital Stay (HOSPITAL_COMMUNITY): Payer: Medicare Other

## 2016-09-14 ENCOUNTER — Encounter (HOSPITAL_COMMUNITY): Payer: Self-pay | Admitting: General Surgery

## 2016-09-14 DIAGNOSIS — Z9049 Acquired absence of other specified parts of digestive tract: Secondary | ICD-10-CM

## 2016-09-14 LAB — BASIC METABOLIC PANEL
ANION GAP: 8 (ref 5–15)
BUN: 12 mg/dL (ref 6–20)
CO2: 18 mmol/L — AB (ref 22–32)
Calcium: 8.5 mg/dL — ABNORMAL LOW (ref 8.9–10.3)
Chloride: 121 mmol/L — ABNORMAL HIGH (ref 101–111)
Creatinine, Ser: 0.89 mg/dL (ref 0.61–1.24)
GFR calc Af Amer: >60 mL/min (ref >60)
GFR calc non Af Amer: >60 mL/min (ref >60)
Glucose, Bld: 94 mg/dL (ref 65–99)
Potassium: 3.7 mmol/L (ref 3.5–5.1)
Sodium: 147 mmol/L — ABNORMAL HIGH (ref 135–145)

## 2016-09-14 LAB — GLUCOSE, CAPILLARY
GLUCOSE-CAPILLARY: 119 mg/dL — AB (ref 65–99)
GLUCOSE-CAPILLARY: 120 mg/dL — AB (ref 65–99)
GLUCOSE-CAPILLARY: 115 mg/dL — AB (ref 65–99)
Glucose-Capillary: 123 mg/dL — ABNORMAL HIGH (ref 65–99)
Glucose-Capillary: 149 mg/dL — ABNORMAL HIGH (ref 65–99)

## 2016-09-14 LAB — CBC
HCT: 30.4 % — ABNORMAL LOW (ref 39.0–52.0)
Hemoglobin: 9.7 g/dL — ABNORMAL LOW (ref 13.0–17.0)
MCH: 29.6 pg (ref 26.0–34.0)
MCHC: 31.9 g/dL (ref 30.0–36.0)
MCV: 92.7 fL (ref 78.0–100.0)
Platelets: 118 10*3/uL — ABNORMAL LOW (ref 150–400)
RBC: 3.28 MIL/uL — AB (ref 4.22–5.81)
RDW: 18.5 % — ABNORMAL HIGH (ref 11.5–15.5)
WBC: 5.7 10*3/uL (ref 4.0–10.5)

## 2016-09-14 LAB — MAGNESIUM: Magnesium: 1.8 mg/dL (ref 1.7–2.4)

## 2016-09-14 LAB — APTT
APTT: 82 s — AB (ref 24–36)
aPTT: 77 seconds — ABNORMAL HIGH (ref 24–36)

## 2016-09-14 LAB — PHOSPHORUS: PHOSPHORUS: 1.2 mg/dL — AB (ref 2.5–4.6)

## 2016-09-14 LAB — HEPARIN LEVEL (UNFRACTIONATED): HEPARIN UNFRACTIONATED: 0.99 [IU]/mL — AB (ref 0.30–0.70)

## 2016-09-14 MED ORDER — SODIUM BICARBONATE 8.4 % IV SOLN
INTRAVENOUS | Status: DC
  Administered 2016-09-14 (×2): via INTRAVENOUS
  Filled 2016-09-14 (×2): qty 100

## 2016-09-14 MED ORDER — VITAL HIGH PROTEIN PO LIQD
1000.0000 mL | ORAL | Status: DC
  Administered 2016-09-14: 1000 mL
  Administered 2016-09-14 – 2016-09-16 (×27)
  Administered 2016-09-16: 1000 mL
  Administered 2016-09-17 (×3)
  Administered 2016-09-17: 1000 mL
  Administered 2016-09-17 (×12)
  Filled 2016-09-14 (×4): qty 1000

## 2016-09-14 MED ORDER — DILTIAZEM LOAD VIA INFUSION
15.0000 mg | Freq: Once | INTRAVENOUS | Status: AC
  Administered 2016-09-14: 15 mg via INTRAVENOUS

## 2016-09-14 MED ORDER — POTASSIUM PHOSPHATES 15 MMOLE/5ML IV SOLN
30.0000 mmol | Freq: Once | INTRAVENOUS | Status: AC
  Administered 2016-09-14: 30 mmol via INTRAVENOUS
  Filled 2016-09-14: qty 10

## 2016-09-14 MED ORDER — POTASSIUM PHOSPHATES 15 MMOLE/5ML IV SOLN: 30.0000 mmol | Freq: Once | INTRAVENOUS | Status: DC

## 2016-09-14 MED ORDER — SODIUM BICARBONATE 8.4 % IV SOLN
INTRAVENOUS | Status: DC
  Filled 2016-09-14: qty 150

## 2016-09-14 NOTE — Progress Notes (Addendum)
ANTICOAGULATION CONSULT NOTE - Follow Up Consult  Pharmacy Consult for heparin Indication: atrial fibrillation  No Known Allergies  Patient Measurements: Height: 5\' 6"  (167.6 cm) Weight: 150 lb 5.7 oz (68.2 kg) IBW/kg (Calculated) : 63.8  Heparin Dosing Weight: using total body weight  Vital Signs: Temp: 99.5 F (37.5 C) (03/23 0308) Temp Source: Axillary (03/23 0308) BP: 131/75 (03/23 0500)  Labs:  Recent Labs  09/12/16 0506 09/12/16 1605 09/12/16 2359 09/13/16 0533 09/14/16 0325 09/14/16 0556  HGB 10.5*  --   --  10.2* 9.7*  --   HCT 32.7*  --   --  32.3* 30.4*  --   PLT 118*  --   --  106* 118*  --   APTT 63* 81* 83*  --   --   --   HEPARINUNFRC 1.52*  --  1.38*  --   --  0.99*  CREATININE 0.77  --   --  0.86 0.89  --     Estimated Creatinine Clearance: 50.8 mL/min (by C-G formula based on SCr of 0.89 mg/dL).   Assessment: 83yo M admitted with N/V and abdominal pain on apixaban for A.fib. Recurrent volvulus was unable to be resolved endoscopically. Pharmacy is asked to start heparin infusion while apixaban on hold.  First heparin level (anti-Xa level) high as expected given recent apixaban use.  Due to the effects of apixaban on heparin levels, using aPTT to guide therapy until levels correlate (effects of apixaban diminished).  Last dose of apixaban reported as 3/18 at 0900.    Significant events: 3/22 lap sigmoid colectomy with end-colostomy, repair of spigelian hernia. Heparin infusion resumed ~6 hours postop per surgery's instructions without bolus.  Today, 09/14/2016: - This morning's heparin level 0.99 and aPTT 82 seconds with heparin rate at 700 units/hr.  Currently using aPTT levels to titrate heparin infusion.  Heparin level trending down.  Should begin to correlate soon as apixaban effects diminish. This morning's aPTT level therapeutic. - Hgb decrease to 9.7 after procedure yesterday. Platelets low but have been stable this admission. - No bleeding  documented.   Goal of Therapy:  aPTT 66-102 sec Heparin level 0.3-0.7 units/ml Monitor platelets by anticoagulation protocol: Yes   Plan:  Continue heparin infusion at 700 units/hr Reheck aPTT in 8 hours to confirm Check heparin level at least daily to determine when levels correlate. Check CBC daily while on heparin. F/u daily.  Hershal Coria, PharmD 09/14/2016 7:25 AM  Addendum 09/14/2016 2:42 PM Repeat aPTT 77 seconds, therapeutic Plan: Continue heparin infusion at current rate and f/u aPTT, HL in AM Hershal Coria, PharmD Pager: [phone] 09/14/2016

## 2016-09-14 NOTE — Progress Notes (Signed)
[facility] Progress Note Patient Name: Eduardo Deleon DOB: 1924-10-20 MRN: [id number]   Date of Service  09/14/2016  HPI/Events of Note  Serum phosphorus 1.2. Potassium 3.7. Creatinine 0.89. currently intubated.   eICU Interventions  K-Phos 30  mmol IV 1      Intervention Category Intermediate Interventions: Electrolyte abnormality - evaluation and management  Tera Partridge 09/14/2016, 4:47 AM

## 2016-09-14 NOTE — Progress Notes (Addendum)
Progress Note  Patient Name: Eduardo Deleon Date of Encounter: 09/14/2016  Primary Cardiologist: Dr. Claiborne Billings  Subjective   On Vent, but awakens to name. No family at the bedside.   Inpatient Medications    Scheduled Meds: . chlorhexidine gluconate (MEDLINE KIT)  15 mL Mouth Rinse BID  . doxazosin  2 mg Oral QHS  . mouth rinse  15 mL Mouth Rinse QID  . mupirocin ointment  1 application Nasal BID  . pantoprazole (PROTONIX) IV  40 mg Intravenous Q12H  . potassium phosphate IVPB (mmol)  30 mmol Intravenous Once  . sodium chloride flush  3 mL Intravenous Q12H   Continuous Infusions: . diltiazem (CARDIZEM) infusion 5 mg/hr (09/14/16 0500)  . heparin 700 Units/hr (09/14/16 0500)  . lactated ringers 75 mL/hr at 09/14/16 0521   PRN Meds: fentaNYL (SUBLIMAZE) injection, fentaNYL (SUBLIMAZE) injection, fentaNYL (SUBLIMAZE) injection, ondansetron (ZOFRAN) IV   Vital Signs    Vitals:   09/14/16 0308 09/14/16 0318 09/14/16 0400 09/14/16 0500  BP:   133/74 131/75  Pulse:      Resp:      Temp: 99.5 F (37.5 C)     TempSrc: Axillary     SpO2:  100% 99% 99%  Weight:      Height:        Intake/Output Summary (Last 24 hours) at 09/14/16 0737 Last data filed at 09/14/16 0523  Gross per 24 hour  Intake          4065.95 ml  Output             1235 ml  Net          2830.95 ml   Filed Weights   09/09/16 1920 09/11/16 2209 09/14/16 0100  Weight: 146 lb 9.7 oz (66.5 kg) 156 lb 1.4 oz (70.8 kg) 150 lb 5.7 oz (68.2 kg)    Telemetry    Atrial fibrillation, HR in 120's - 130's following surgery. Improved to 80's - 90's this AM. - Personally Reviewed  ECG    No new tracings.   Physical Exam   General: Chronically-ill appearing Caucasian male appearing in no acute distress. Head: Normocephalic, atraumatic.  Neck: Supple without bruits, JVD not elevated. Lungs:  Resp regular and unlabored, CTA without wheezing or rales. Heart: Irregularly irregular, S1, S2, no S3, S4, or  murmur; no rub. Abdomen: Soft, non-tender, non-distended with normoactive bowel sounds. No hepatomegaly. No rebound/guarding. Ostomy noted.  Extremities: No clubbing, cyanosis, or edema. Distal pedal pulses are 2+ bilaterally. Neuro: Intubated and awake on vent.  Psych: Normal affect.  Labs    Chemistry Recent Labs Lab 09/10/16 0331 09/11/16 0403 09/12/16 0506 09/13/16 0533 09/14/16 0325  NA 141 143 145 148* 147*  K 3.5 3.1* 3.9 4.1 3.7  CL 108 112* 115* 119* 121*  CO2 27 23 19* 16* 18*  GLUCOSE 98 74 76 94 94  BUN 20 19 17 14 12   CREATININE 0.86 0.81 0.77 0.86 0.89  CALCIUM 8.2* 8.2* 8.4* 8.6* 8.5*  PROT 5.9* 5.8*  --  6.2*  --   ALBUMIN 3.3* 3.2*  --  3.4*  --   AST 16 23  --  44*  --   ALT 10* 12*  --  22  --   ALKPHOS 61 69  --  66  --   BILITOT 1.1 1.4*  --  1.7*  --   GFRNONAA >60 >60 >60 >60 >60  GFRAA >60 >60 >60 >60 >60  ANIONGAP 6  8 11 13 8      Hematology Recent Labs Lab 09/12/16 0506 09/13/16 0533 09/14/16 0325  WBC 4.8 4.7 5.7  RBC 3.56* 3.46* 3.28*  HGB 10.5* 10.2* 9.7*  HCT 32.7* 32.3* 30.4*  MCV 91.9 93.4 92.7  MCH 29.5 29.5 29.6  MCHC 32.1 31.6 31.9  RDW 17.8* 18.4* 18.5*  PLT 118* 106* 118*    Cardiac EnzymesNo results for input(s): TROPONINI in the last 168 hours. No results for input(s): TROPIPOC in the last 168 hours.   BNPNo results for input(s): BNP, PROBNP in the last 168 hours.   DDimer No results for input(s): DDIMER in the last 168 hours.   Radiology    Dg Chest Port 1 View  Result Date: 09/14/2016 CLINICAL DATA:  Hypoxia EXAM: PORTABLE CHEST 1 VIEW COMPARISON:  September 13, 2016 FINDINGS: Endotracheal tube tip is 1.3 cm above the carina. Nasogastric tube tip and side port are below the diaphragm with the side port seen within the stomach region. No pneumothorax. There is pleural effusion on the left with left lower lobe atelectasis. There may be a degree of superimposed pneumonia in the left base. Lungs elsewhere are clear. There  is cardiomegaly with pulmonary vascularity within normal limits. No evident adenopathy. No bone lesions. There is atherosclerotic calcification in the aorta. IMPRESSION: Tube and catheter positions as described without pneumothorax. Advise withdrawing endotracheal tube approximately 3 cm. Atelectasis and possible superimposed pneumonia left base with left pleural effusion, stable. Right lung clear. Stable cardiomegaly. There is aortic atherosclerosis. Note that in comparison with prior study, there has been resolution of subcutaneous emphysema. Electronically Signed   By: Lowella Grip III M.D.   On: 09/14/2016 07:12   Portable Chest Xray  Result Date: 09/13/2016 CLINICAL DATA:  Intubated. EXAM: PORTABLE CHEST 1 VIEW COMPARISON:  09/09/2016. FINDINGS: Endotracheal tube tip at the carina. Interval nasogastric tube extending into the stomach. Stable enlarged cardiac silhouette. Interval patchy opacity at the left lung base and in the right perihilar region and medial right lung base. Interval bilateral subcutaneous emphysema. No pneumothorax or pneumoperitoneum seen. Diffuse osteopenia with stable evidence of a chronic rotator cuff tear on the right. IMPRESSION: 1. Endotracheal tube tip at the carina. It is recommended that this be retracted 4.5 cm. 2. Interval dense left basilar atelectasis or pneumonia and right perihilar and medial right lung base probable pneumonia. These results will be called to the ordering clinician or representative by the Radiologist Assistant, and communication documented in the PACS or zVision Dashboard. Electronically Signed   By: Claudie Revering M.D.   On: 09/13/2016 16:28   Dg Abd 2 Views  Result Date: 09/12/2016 CLINICAL DATA:  Nausea, vomiting and abdominal pain. Recurrent volvulus. EXAM: ABDOMEN - 2 VIEW COMPARISON:  09/11/2016 and CT 09/10/2015 FINDINGS: Again noted is a large gas-filled loop of bowel in the right abdomen that is most compatible with dilated sigmoid colon.  No significant gas in the rectum. Bowel gas pattern remains compatible with a sigmoid volvulus and similar to the previous examination. No evidence for free air on the left lateral decubitus image. Again noted is levoscoliosis of the thoracolumbar spine. Multiple phleboliths in the pelvis. IMPRESSION: Distended bowel in the abdomen and the configuration may remains compatible with a sigmoid volvulus. Minimal change from the recent comparison examination. No evidence for free air. Electronically Signed   By: Markus Daft M.D.   On: 09/12/2016 10:39    Cardiac Studies   Echocardiogram: 03/2016  Study Conclusions - Left ventricle:  The cavity size was normal. Wall thickness was   increased in a pattern of mild LVH. Systolic function was normal.   The estimated ejection fraction was in the range of 50% to 55%.   Doppler parameters are consistent with both elevated ventricular   end-diastolic filling pressure and elevated left atrial filling   pressure. - Mitral valve: There was mild regurgitation. - Left atrium: The atrium was mildly dilated. - Right atrium: The atrium was mildly dilated. - Tricuspid valve: There was mild-moderate regurgitation. - Pulmonary arteries: PA peak pressure: 40 mm Hg (S).  Patient Profile     83 y.o. male w/ PMH of SSS, permanent Afib, GERD, prostate Ca, sigmoid volvulus, PVD, CVA, dementia, and torticollis who presented to [facility] ED on 09/09/2016 with abd pain 2/2 recent sigmoid volvulus. Cards consulted for Afib with RVR and surgical clearance. Underwent sigmoid colectomy with colostomy on 09/13/2016.  Assessment & Plan    1. Atrial Fibrillation with RVR - Rates in the 120s-140's prior to and following surgery. Now in the 80's - 90's.  - remains on IV Diltiazem at 50m/hr for now. Continue until able to resume PO's.  - This patients CHA2DS2-VASc Score and unadjusted Ischemic Stroke Rate (% per year) is equal to 11.2 % stroke rate/year from a score of 7 (HTN, PAD, Age (2),  CVA (2). PTA Eliquis held. Continue with Heparin at this time.   2. Sigmoid volvulus - s/p laparoscopic-assisted sigmoid colectomy with end-colostomy on 3/22. - monitor fluid status closely. He does not appear volume overloaded by physical examination.  - per Surgery.   Signed, BErma Heritage, PA-C 7:37 AM 09/14/2016 Pager: [phone]  I have examined the patient and reviewed assessment and plan and discussed with patient.  Agree with above as stated.  Rate control with IV Dilt as BP allows.  Still intubated.  Multiple medical issues including dementia which limit recovery.    JLarae Grooms

## 2016-09-14 NOTE — Progress Notes (Signed)
Nutrition Follow-up  DOCUMENTATION CODES:   Not applicable  INTERVENTION:  - Will order Vital High Protein @ 15 mL/hr which, in addition to kcal from IVF, will provide 720 kcal, 32 grams of protein, and 301 mL free water.  - Will monitor for ability to advance TF rate versus extubation with diet advancement.  NUTRITION DIAGNOSIS:   Inadequate oral intake related to inability to eat as evidenced by NPO status. -ongoing  GOAL:   Patient will meet greater than or equal to 90% of their needs -unmet  MONITOR:   Vent status, TF tolerance, Weight trends, Labs, Skin  REASON FOR ASSESSMENT:   Ventilator, Consult Enteral/tube feeding initiation and management  ASSESSMENT:   83 y.o. male presenting via EMS from [place] home with 1 week of sudden onset suprapubic pain which starts just below the navel and radiates down but doesn't involve his genitalia. He has had a small soft bowel movement yesterday. He also reports a productive cough for the past two weeks  3/23 POD #1 lap colon resection of sigmoid with colonoscopy and hernial repair. Pt intubated with OGT in since time of procedure yesterday. Estimated nutrition needs updated based on this event. No family/visitors present this AM. Consult for TF initiation of trophic/trickle feeds only. Will order as outlined above to maximize protein and monitor for ability to advance TF rate versus extubation and diet advancement. Per notes, pt with chronic dysphagia and will be on aspiration precautions following extubation. Pt with stage 1 prostate cancer.   Patient is currently intubated on ventilator support MV: 10.7 L/min Temp (24hrs), Avg:98.4 F (36.9 C), Min:96.8 F (36 C), Max:99.5 F (37.5 C) Propofol: none  Medications reviewed; Labs reviewed; Na: 147 mmol/L, Cl: 121 mmol/L, Ca: 8.5 mg/dL, Phos: 1.2 mg/dL.  IVF: D5-100 mEq sodium bicarb @ 75 mL/hr (306 kcal from dextrose).     3/19 - Pt has been NPO since  admission.  - Attempted flex sig yesterday but GI NP note at 1239 today states that this procedure was unsuccessful.  - Per GI NP note: Previously deemed a poor surgical candidate by Surgery but would ask for reevaluation for consideration of adiverting colostomy.  - No family/visitors at bedside.  - When introduced self to pt he stated a sequence of several numbers but was unable to state what any of these numbers meant.  - Asked pt about any abdominal pain or nausea but he did not respond to this.  - Per notes, pt had denied these symptoms earlier today.   - Pt seemed to be very HOH during visit although this is not documented in other notes.  - Per MD note, pt with chronic aspiration.  - Pt missing many teeth.  - Physical assessment limited to upper body only at this time and shows some mild muscle wasting around shoulder and clavicle areas; may be related to anatomy d/t torticollis.  - Per chart review, weight has been stable since October 2017.  IVF: NS @ 75 mL/hr.    Diet Order:  Diet NPO time specified  Skin:  Wound (see comment) (Stage 2 nonhealing L ankle pressure injury)  Last BM:  3/23  Height:   Ht Readings from Last 1 Encounters:  09/11/16 5\' 6"  (1.676 m)    Weight:   Wt Readings from Last 1 Encounters:  09/14/16 150 lb 5.7 oz (68.2 kg)    Ideal Body Weight:  64.54 kg  BMI:  Body mass index is 24.27 kg/m.  Estimated Nutritional Needs:  Kcal:  1626  Protein:  82-102 grams (1.2-1.5 grams/kg)  Fluid:  >/= 1.7 L/day  EDUCATION NEEDS:   No education needs identified at this time    Jarome Matin, MS, RD, LDN, CNSC Inpatient Clinical Dietitian Pager # [phone] After hours/weekend pager # [phone]

## 2016-09-14 NOTE — Progress Notes (Signed)
Post op day 1, s/p  Laparoscopic-assisted sigmoid colectomy with end-colostomy, Hartmann's procedure; Laparoscopic repair of spigelian hernia primarily, 09/13/16, Dr. Greer Pickerel Eyes open, sitting up, responds when name is called, tracks me in the room, attempting to mumble around the ETT   BP 119/88   Pulse 96   Temp 98.8 F (37.1 C) (Oral)   Resp (!) 27   Ht 5\' 6"  (1.676 m)   Wt 68.2 kg (150 lb 5.7 oz)   SpO2 100%   BMI 24.27 kg/m  NAD Severe torticollis   clear breath sounds, on the vent.  Abdomen has colostomy No edema, skin warm and dry Labs and imaging noted.  A/P: Recurrent volvulus now s/p sigmoid resection and colostomy On the vent, today's SBT trial noted To start TF soon   Call placed and discussed with HCPOA agent, god son Mr Lucy Antigua, who will be back in town over the weekend, updated him on the patient's condition overall.  We will continue to check in periodically, based on the patient's disease trajectory in the post op period 15 minutes spent  [place] team [phone]

## 2016-09-14 NOTE — Consult Note (Signed)
[facility] Nurse ostomy consult note Stoma type/location: LUQ colostomy Stomal assessment/size: red, edematous, os at center Peristomal assessment: intact, clear with creases in the distal parastomal region Treatment options for stomal/peristomal skin: Skin barrier rings Output: tea-colored liquid Ostomy pouching: 2pc., 4-inch system applied today (cut off center) and another in this size is supplied to the room,  I anticipate that a 2 and 3/4 inch pouching system will suffice in the long term, but is too tight today. Education provided: None.  Patient is on the vent and there is not family in the room. Enrolled patient in [facility] program: No WOC nursing team will follow, and will remain available to this patient, the nursing, surgical and medical teams.   Thanks, Maudie Flakes, MSN, RN, [place], Arther Abbott  Pager# [phone]

## 2016-09-14 NOTE — Progress Notes (Signed)
Date:  September 14, 2016 Chart reviewed for concurrent status and case management needs. Will continue to follow patient progress.  o2 at 40% fio2 with abnormal abg's Discharge Planning: following for needs Expected discharge date: [id number] Calysta Craigo, BSN, [place], [place]

## 2016-09-14 NOTE — Progress Notes (Addendum)
PROGRESS NOTE NOT CONSULT AS LISTED ABOVE  Name: Eduardo Deleon MRN: [id number] DOB: 03-Mar-1925    ADMISSION DATE:  09/09/2016 CONSULTATION DATE: 3/20  REFERRING MD : [name]   CHIEF COMPLAINT:  Pre-op pulmonary/critical care risk assessment   BRIEF PATIENT DESCRIPTION:  This is a 83 year old male who resides at a SNF. He has a sig medical h/o AF, prior CVA, severe torticollis, mild dementia, BPH w/ slow growing prostate cancer, Chronic diastolic HF, chronic aspiration, CKD stage I and non-healing left ankle ulcer. Presented to ER 3/18 w/ abd pain and vomiting. Found to have recurrent sigmoid volvulus. He was seen by GI medicine w/ unsuccessful decompression attempt & it was felt that it could not be flipped endoscopically. He was seen by surgery who feels that surgical management would require permanent colostomy. Given his multiple medical co-morbids PCCM was asked to see and comment on pulmonary risk.    SIGNIFICANT EVENTS  OR for sigmoid resection and colostomy 3/22. Returned to ICU on vent   STUDIES:  3/18 CT abd: Recurrent distal sigmoid colon volvulus resulting in upstream colonic obstruction. 3/23 CXR: Left base opacity. Cardiomegaly. ETT is ok position.  I have reviewed the images personally.  SUBJECTIVE:  Awake, responsive Failed SBTs today AM HR is in 120-140s  VITAL SIGNS: Temp:  [96.8 F (36 C)-99.5 F (37.5 C)] 99.5 F (37.5 C) (03/23 0308) Resp:  [15-25] 18 (03/23 0800) BP: (96-136)/(55-84) 128/72 (03/23 0813) SpO2:  [97 %-100 %] 97 % (03/23 0813) FiO2 (%):  [40 %-100 %] 40 % (03/23 0813) Weight:  [150 lb 5.7 oz (68.2 kg)] 150 lb 5.7 oz (68.2 kg) (03/23 0100)   Intake/Output Summary (Last 24 hours) at 09/14/16 0912 Last data filed at 09/14/16 0900  Gross per 24 hour  Intake          4440.16 ml  Output             1235 ml  Net          3205.16 ml    Gen:      Old frail, no distress HEENT:  EOMI, sclera anicteric Neck:     No masses; no thyromegaly,  severe lt torticollis.  Lungs:    Clear to auscultation bilaterally; normal respiratory effort CV:         Regular rate and rhythm; no murmurs Abd:      + bowel sounds; soft, non-tender; no palpable masses, no distension, colostomy site CDI Ext:    No edema; adequate peripheral perfusion Skin:      Warm and dry; no rash Neuro: Awake, responsive  CBC Recent Labs     09/12/16  0506  09/13/16  0533  09/14/16  0325  WBC  4.8  4.7  5.7  HGB  10.5*  10.2*  9.7*  HCT  32.7*  32.3*  30.4*  PLT  118*  106*  118*    Coag's Recent Labs     09/12/16  1605  09/12/16  2359  09/14/16  0556  APTT  81*  83*  82*    BMET Recent Labs     09/12/16  0506  09/13/16  0533  09/14/16  0325  NA  145  148*  147*  K  3.9  4.1  3.7  CL  115*  119*  121*  CO2  19*  16*  18*  BUN  17  14  12   CREATININE  0.77  0.86  0.89  GLUCOSE  76  94  94    Electrolytes Recent Labs     09/12/16  0506  09/13/16  0533  09/14/16  0325  CALCIUM  8.4*  8.6*  8.5*  MG  2.1   --   1.8  PHOS   --    --   1.2*    Sepsis Markers No results for input(s): PROCALCITON, O2SATVEN in the last 72 hours.  Invalid input(s): LACTICACIDVEN  ABG Recent Labs     09/13/16  1625  09/13/16  1900  PHART  7.215*  7.279*  PCO2ART  37.1  33.5  PO2ART  433*  195*    Liver Enzymes Recent Labs     09/13/16  0533  AST  44*  ALT  22  ALKPHOS  66  BILITOT  1.7*  ALBUMIN  3.4*    Cardiac Enzymes No results for input(s): TROPONINI, PROBNP in the last 72 hours.  Glucose Recent Labs     09/13/16  1958  GLUCAP  105*    Imaging Dg Chest Port 1 View  Result Date: 09/14/2016 CLINICAL DATA:  Hypoxia EXAM: PORTABLE CHEST 1 VIEW COMPARISON:  September 13, 2016 FINDINGS: Endotracheal tube tip is 1.3 cm above the carina. Nasogastric tube tip and side port are below the diaphragm with the side port seen within the stomach region. No pneumothorax. There is pleural effusion on the left with left lower lobe atelectasis.  There may be a degree of superimposed pneumonia in the left base. Lungs elsewhere are clear. There is cardiomegaly with pulmonary vascularity within normal limits. No evident adenopathy. No bone lesions. There is atherosclerotic calcification in the aorta. IMPRESSION: Tube and catheter positions as described without pneumothorax. Advise withdrawing endotracheal tube approximately 3 cm. Atelectasis and possible superimposed pneumonia left base with left pleural effusion, stable. Right lung clear. Stable cardiomegaly. There is aortic atherosclerosis. Note that in comparison with prior study, there has been resolution of subcutaneous emphysema. Electronically Signed   By: Lowella Grip III M.D.   On: 09/14/2016 07:12   Portable Chest Xray  Result Date: 09/13/2016 CLINICAL DATA:  Intubated. EXAM: PORTABLE CHEST 1 VIEW COMPARISON:  09/09/2016. FINDINGS: Endotracheal tube tip at the carina. Interval nasogastric tube extending into the stomach. Stable enlarged cardiac silhouette. Interval patchy opacity at the left lung base and in the right perihilar region and medial right lung base. Interval bilateral subcutaneous emphysema. No pneumothorax or pneumoperitoneum seen. Diffuse osteopenia with stable evidence of a chronic rotator cuff tear on the right. IMPRESSION: 1. Endotracheal tube tip at the carina. It is recommended that this be retracted 4.5 cm. 2. Interval dense left basilar atelectasis or pneumonia and right perihilar and medial right lung base probable pneumonia. These results will be called to the ordering clinician or representative by the Radiologist Assistant, and communication documented in the PACS or zVision Dashboard. Electronically Signed   By: Claudie Revering M.D.   On: 09/13/2016 16:28   Dg Abd 2 Views  Result Date: 09/12/2016 CLINICAL DATA:  Nausea, vomiting and abdominal pain. Recurrent volvulus. EXAM: ABDOMEN - 2 VIEW COMPARISON:  09/11/2016 and CT 09/10/2015 FINDINGS: Again noted is a large  gas-filled loop of bowel in the right abdomen that is most compatible with dilated sigmoid colon. No significant gas in the rectum. Bowel gas pattern remains compatible with a sigmoid volvulus and similar to the previous examination. No evidence for free air on the left lateral decubitus image. Again noted is levoscoliosis of the thoracolumbar spine. Multiple phleboliths in  the pelvis. IMPRESSION: Distended bowel in the abdomen and the configuration may remains compatible with a sigmoid volvulus. Minimal change from the recent comparison examination. No evidence for free air. Electronically Signed   By: Markus Daft M.D.   On: 09/12/2016 10:39   ASSESSMENT / PLAN:  Sigmoid volvulus now s/p sigmoid colectomy w/ colostomy Plan NPO Post op care per surgery  Ventilator dependence  Chronic aspiration (CT abd showing limited field of chest was clear) Torticollis.  -left intubated post op d/t residual anesthetics. Anticipated difficult airway although anesthesia note states grade I airway Plan Continue vent support and daily SBTs Would need to optimize resp status further before extubation Will need difficult airway equipment at bedside when extubated.  Chronic dysphagia Plan Aspiration precautions after extubation  Hypernatremia  Hyperchloremic non gap acidosis  Hypernatremia Plan Change IVF to D5 with 100 meq bicarb Free water with the bicarb drip to correct hypernatremia Follow ABG and chemistries  CAF w/ CVR Plan Continue heparin Use Cardizem drip for 5-15 for rate control Cardiology on board. Appreciate reccs  h/o prostate cancer; CRI stage I Plan Follow urine output and lytes Replete phos  GERD Plan PPI  Mild chronic thrombocytopenia Anemia of chronic disease Plan Follow CBC Transfuse for Hb < 7  H/O CVA w/ Deconditioning Dementia  And very hard of hearing  Plan PT, OT when extubated  Discussion: He will need further optimization before extubation. For today we  will control afib better, correct phos, Na and acidosis.  The patient is critically ill with multiple organ system failure and requires high complexity decision making for assessment and support, frequent evaluation and titration of therapies, advanced monitoring, review of radiographic studies and interpretation of complex data.   Critical Care Time devoted to patient care services, exclusive of separately billable procedures, described in this note is 40 minutes.   Marshell Garfinkel MD [facility] Pager [phone] If no answer or after 3pm call: [phone] 09/14/2016, 9:21 AM

## 2016-09-14 NOTE — Progress Notes (Signed)
1 Day Post-Op  Subjective: Still on the Vent, wakes up, but has mittens on.  VSS, Ostomy is large, pink and edematous;  some fluid in the bag.  Objective: Vital signs in last 24 hours: Temp:  [96.8 F (36 C)-99.5 F (37.5 C)] 99.5 F (37.5 C) (03/23 0308) Resp:  [15-25] 18 (03/23 0200) BP: (96-136)/(55-84) 131/75 (03/23 0500) SpO2:  [99 %-100 %] 99 % (03/23 0500) FiO2 (%):  [50 %-100 %] 50 % (03/23 0500) Weight:  [68.2 kg (150 lb 5.7 oz)] 68.2 kg (150 lb 5.7 oz) (03/23 0100) Last BM Date: 09/13/16  4066 IV fluids Urine 1210 No BM Afebrile, VSS 3 AM labs OK  - on heparin and Cardizem drips CXR this AM:  Endotracheal tube tip is 1.3 cm above the carina. Nasogastric tube tip and side port are below the diaphragm with the side port seen within the stomach region. No pneumothorax. There is pleural effusion on the left with left lower lobe atelectasis. There may be a degree of superimposed pneumonia in the left base. Lungs elsewhere are clear. There is cardiomegaly with pulmonary vascularity within normal limits. No evident adenopathy. No bone lesions. There is atherosclerotic calcification in the aorta.   Intake/Output from previous day: 03/22 0701 - 03/23 0700 In: 4066 [I.V.:3306; IV Piggyback:760] Out: 1235 [Urine:1210; Blood:25] Intake/Output this shift: No intake/output data recorded.  General appearance: sleeping on the vent when I went in he did wake up. No distress, he does have Mittens in place. GI: Incision looks good, waffle dressing in place.  ostomy is large, pink and a little edematous.  He has some fluid coming from the ostomy now. But not much      Lab Results:   Recent Labs  09/13/16 0533 09/14/16 0325  WBC 4.7 5.7  HGB 10.2* 9.7*  HCT 32.3* 30.4*  PLT 106* 118*    BMET  Recent Labs  09/13/16 0533 09/14/16 0325  NA 148* 147*  K 4.1 3.7  CL 119* 121*  CO2 16* 18*  GLUCOSE 94 94  BUN 14 12  CREATININE 0.86 0.89  CALCIUM 8.6* 8.5*    PT/INR No results for input(s): LABPROT, INR in the last 72 hours.   Recent Labs Lab 09/09/16 1232 09/10/16 0331 09/11/16 0403 09/13/16 0533  AST 18 16 23  44*  ALT 12* 10* 12* 22  ALKPHOS 68 61 69 66  BILITOT 1.1 1.1 1.4* 1.7*  PROT 6.7 5.9* 5.8* 6.2*  ALBUMIN 3.8 3.3* 3.2* 3.4*     Lipase     Component Value Date/Time   LIPASE 20 09/09/2016 1232     Studies/Results: Dg Chest Port 1 View  Result Date: 09/14/2016 CLINICAL DATA:  Hypoxia EXAM: PORTABLE CHEST 1 VIEW COMPARISON:  September 13, 2016 FINDINGS: Endotracheal tube tip is 1.3 cm above the carina. Nasogastric tube tip and side port are below the diaphragm with the side port seen within the stomach region. No pneumothorax. There is pleural effusion on the left with left lower lobe atelectasis. There may be a degree of superimposed pneumonia in the left base. Lungs elsewhere are clear. There is cardiomegaly with pulmonary vascularity within normal limits. No evident adenopathy. No bone lesions. There is atherosclerotic calcification in the aorta. IMPRESSION: Tube and catheter positions as described without pneumothorax. Advise withdrawing endotracheal tube approximately 3 cm. Atelectasis and possible superimposed pneumonia left base with left pleural effusion, stable. Right lung clear. Stable cardiomegaly. There is aortic atherosclerosis. Note that in comparison with prior study, there has  been resolution of subcutaneous emphysema. Electronically Signed   By: Lowella Grip III M.D.   On: 09/14/2016 07:12   Portable Chest Xray  Result Date: 09/13/2016 CLINICAL DATA:  Intubated. EXAM: PORTABLE CHEST 1 VIEW COMPARISON:  09/09/2016. FINDINGS: Endotracheal tube tip at the carina. Interval nasogastric tube extending into the stomach. Stable enlarged cardiac silhouette. Interval patchy opacity at the left lung base and in the right perihilar region and medial right lung base. Interval bilateral subcutaneous emphysema. No pneumothorax  or pneumoperitoneum seen. Diffuse osteopenia with stable evidence of a chronic rotator cuff tear on the right. IMPRESSION: 1. Endotracheal tube tip at the carina. It is recommended that this be retracted 4.5 cm. 2. Interval dense left basilar atelectasis or pneumonia and right perihilar and medial right lung base probable pneumonia. These results will be called to the ordering clinician or representative by the Radiologist Assistant, and communication documented in the PACS or zVision Dashboard. Electronically Signed   By: Claudie Revering M.D.   On: 09/13/2016 16:28   Dg Abd 2 Views  Result Date: 09/12/2016 CLINICAL DATA:  Nausea, vomiting and abdominal pain. Recurrent volvulus. EXAM: ABDOMEN - 2 VIEW COMPARISON:  09/11/2016 and CT 09/10/2015 FINDINGS: Again noted is a large gas-filled loop of bowel in the right abdomen that is most compatible with dilated sigmoid colon. No significant gas in the rectum. Bowel gas pattern remains compatible with a sigmoid volvulus and similar to the previous examination. No evidence for free air on the left lateral decubitus image. Again noted is levoscoliosis of the thoracolumbar spine. Multiple phleboliths in the pelvis. IMPRESSION: Distended bowel in the abdomen and the configuration may remains compatible with a sigmoid volvulus. Minimal change from the recent comparison examination. No evidence for free air. Electronically Signed   By: Markus Daft M.D.   On: 09/12/2016 10:39    Medications: . chlorhexidine gluconate (MEDLINE KIT)  15 mL Mouth Rinse BID  . doxazosin  2 mg Oral QHS  . mouth rinse  15 mL Mouth Rinse QID  . mupirocin ointment  1 application Nasal BID  . pantoprazole (PROTONIX) IV  40 mg Intravenous Q12H  . potassium phosphate IVPB (mmol)  30 mmol Intravenous Once  . sodium chloride flush  3 mL Intravenous Q12H   . diltiazem (CARDIZEM) infusion 5 mg/hr (09/14/16 0500)  . heparin 700 Units/hr (09/14/16 0500)  . lactated ringers 75 mL/hr at 09/14/16 9371    Assessment/Plan Recurrent sigmoid volvulus,  Right lower quadrant spigelian hernia s/p Laparoscopic-assisted sigmoid colectomy with end-colostomy, Hartmann's procedure; Laparoscopic repair of spigelian hernia primarily, 09/13/16, Dr. Greer Pickerel Severe torticollis Severe hearing deficit Hx of CVA History of congestive heart failure History of chronic renal insufficiency History of prostate cancer - slow growing Osteoarthritis GERD DNR FEN:IV fluids/Intubated ID:  Cefotetan pre op 09/13/16 DVT:  Heparin drip   Plan:  When he is ready for extubation I think the recommendation was to have anesthesia available with Glidescope.  We will keep him NPO, and await bowel function return.  Wound care is aware of him they marked him Pre op.  They will follow the ostomy.       LOS: 5 days    Vallorie Niccoli 09/14/2016 [phone]

## 2016-09-15 ENCOUNTER — Inpatient Hospital Stay (HOSPITAL_COMMUNITY): Payer: Medicare Other

## 2016-09-15 DIAGNOSIS — K56609 Unspecified intestinal obstruction, unspecified as to partial versus complete obstruction: Secondary | ICD-10-CM

## 2016-09-15 DIAGNOSIS — I4891 Unspecified atrial fibrillation: Secondary | ICD-10-CM

## 2016-09-15 LAB — BASIC METABOLIC PANEL
ANION GAP: 6 (ref 5–15)
Anion gap: 6 (ref 5–15)
BUN: 10 mg/dL (ref 6–20)
BUN: 12 mg/dL (ref 6–20)
CALCIUM: 8.1 mg/dL — AB (ref 8.9–10.3)
CHLORIDE: 112 mmol/L — AB (ref 101–111)
CO2: 26 mmol/L (ref 22–32)
CO2: 26 mmol/L (ref 22–32)
CREATININE: 0.64 mg/dL (ref 0.61–1.24)
Calcium: 8.1 mg/dL — ABNORMAL LOW (ref 8.9–10.3)
Chloride: 110 mmol/L (ref 101–111)
Creatinine, Ser: 0.79 mg/dL (ref 0.61–1.24)
GFR calc non Af Amer: >60 mL/min (ref >60)
Glucose, Bld: 131 mg/dL — ABNORMAL HIGH (ref 65–99)
Glucose, Bld: 152 mg/dL — ABNORMAL HIGH (ref 65–99)
POTASSIUM: 2.7 mmol/L — AB (ref 3.5–5.1)
Potassium: 3.9 mmol/L (ref 3.5–5.1)
SODIUM: 144 mmol/L (ref 135–145)
SODIUM: 142 mmol/L (ref 135–145)

## 2016-09-15 LAB — CBC
HEMATOCRIT: 30.5 % — AB (ref 39.0–52.0)
Hemoglobin: 10.1 g/dL — ABNORMAL LOW (ref 13.0–17.0)
MCH: 29.3 pg (ref 26.0–34.0)
MCHC: 33.1 g/dL (ref 30.0–36.0)
MCV: 88.4 fL (ref 78.0–100.0)
PLATELETS: 118 10*3/uL — AB (ref 150–400)
RBC: 3.45 MIL/uL — ABNORMAL LOW (ref 4.22–5.81)
RDW: 18 % — AB (ref 11.5–15.5)
WBC: 5.4 10*3/uL (ref 4.0–10.5)

## 2016-09-15 LAB — BLOOD GAS, ARTERIAL
Acid-Base Excess: 5.6 mmol/L — ABNORMAL HIGH (ref 0.0–2.0)
BICARBONATE: 27.5 mmol/L (ref 20.0–28.0)
Drawn by: 235321
FIO2: 40
LHR: 18 {breaths}/min
O2 SAT: 99 %
PATIENT TEMPERATURE: 98.6
PEEP: 5 cmH2O
PH ART: 7.564 — AB (ref 7.350–7.450)
VT: 510 mL
pCO2 arterial: 30.4 mmHg — ABNORMAL LOW (ref 32.0–48.0)
pO2, Arterial: 118 mmHg — ABNORMAL HIGH (ref 83.0–108.0)

## 2016-09-15 LAB — GLUCOSE, CAPILLARY
GLUCOSE-CAPILLARY: 136 mg/dL — AB (ref 65–99)
GLUCOSE-CAPILLARY: 141 mg/dL — AB (ref 65–99)
GLUCOSE-CAPILLARY: 119 mg/dL — AB (ref 65–99)
Glucose-Capillary: 132 mg/dL — ABNORMAL HIGH (ref 65–99)

## 2016-09-15 LAB — APTT: aPTT: 80 seconds — ABNORMAL HIGH (ref 24–36)

## 2016-09-15 LAB — MAGNESIUM
MAGNESIUM: 1.7 mg/dL (ref 1.7–2.4)
Magnesium: 2.3 mg/dL (ref 1.7–2.4)

## 2016-09-15 LAB — PROCALCITONIN: PROCALCITONIN: 0.12 ng/mL

## 2016-09-15 LAB — HEPARIN LEVEL (UNFRACTIONATED): Heparin Unfractionated: 0.97 IU/mL — ABNORMAL HIGH (ref 0.30–0.70)

## 2016-09-15 LAB — PHOSPHORUS: Phosphorus: 1.1 mg/dL — ABNORMAL LOW (ref 2.5–4.6)

## 2016-09-15 MED ORDER — POTASSIUM CHLORIDE 20 MEQ/15ML (10%) PO SOLN
40.0000 meq | Freq: Once | ORAL | Status: AC
  Administered 2016-09-15: 40 meq via ORAL
  Filled 2016-09-15: qty 30

## 2016-09-15 MED ORDER — POTASSIUM PHOSPHATES 15 MMOLE/5ML IV SOLN
30.0000 mmol | Freq: Once | INTRAVENOUS | Status: AC
  Administered 2016-09-15: 30 mmol via INTRAVENOUS
  Filled 2016-09-15: qty 10

## 2016-09-15 MED ORDER — FUROSEMIDE 10 MG/ML IJ SOLN
40.0000 mg | Freq: Two times a day (BID) | INTRAMUSCULAR | Status: DC
  Administered 2016-09-15 – 2016-09-16 (×4): 40 mg via INTRAVENOUS
  Filled 2016-09-15 (×4): qty 4

## 2016-09-15 MED ORDER — POTASSIUM PHOSPHATES 15 MMOLE/5ML IV SOLN
40.0000 meq | Freq: Once | INTRAVENOUS | Status: AC
  Administered 2016-09-15: 40 meq via INTRAVENOUS
  Filled 2016-09-15: qty 9.09

## 2016-09-15 MED ORDER — POTASSIUM CHLORIDE 20 MEQ/15ML (10%) PO SOLN
40.0000 meq | ORAL | Status: AC
  Administered 2016-09-15 (×2): 40 meq
  Filled 2016-09-15 (×2): qty 30

## 2016-09-15 MED ORDER — MIDAZOLAM HCL 2 MG/2ML IJ SOLN
1.0000 mg | INTRAMUSCULAR | Status: DC | PRN
  Administered 2016-09-16 (×2): 1 mg via INTRAVENOUS
  Filled 2016-09-15 (×3): qty 2

## 2016-09-15 MED ORDER — POTASSIUM PHOSPHATES 15 MMOLE/5ML IV SOLN: 30.0000 mmol | Freq: Once | INTRAVENOUS | Status: DC

## 2016-09-15 MED ORDER — MAGNESIUM SULFATE 4 GM/100ML IV SOLN
4.0000 g | Freq: Once | INTRAVENOUS | Status: AC
  Administered 2016-09-15: 4 g via INTRAVENOUS
  Filled 2016-09-15: qty 100

## 2016-09-15 MED ORDER — METOPROLOL TARTRATE 5 MG/5ML IV SOLN
5.0000 mg | INTRAVENOUS | Status: DC | PRN
  Administered 2016-09-15: 5 mg via INTRAVENOUS
  Filled 2016-09-15: qty 5

## 2016-09-15 MED ORDER — MIDAZOLAM HCL 2 MG/2ML IJ SOLN
1.0000 mg | INTRAMUSCULAR | Status: DC | PRN
  Administered 2016-09-15 – 2016-09-17 (×4): 1 mg via INTRAVENOUS
  Filled 2016-09-15 (×3): qty 2

## 2016-09-15 MED ORDER — MAGNESIUM SULFATE 2 GM/50ML IV SOLN: 2.0000 g | Freq: Once | INTRAVENOUS | Status: DC

## 2016-09-15 NOTE — Progress Notes (Signed)
Pt became agitated and HR tachycardiac in 160s.  Administered 3mcg of fentanyl and informed Dr. Vaughan Browner.  5mg  of IV metoprolol ordered and admnistered.  Pt HR went back down to the 90s.  BP remained stable throughout episode.  Irven Baltimore, RN

## 2016-09-15 NOTE — Progress Notes (Addendum)
General Surgery [facility]  Assessment & Plan:  Recurrent sigmoid volvulus,  Right lower quadrant spigelian hernia POD#2  s/p Laparoscopic-assisted sigmoid colectomy with end-colostomy, Hartmann's procedure  s/p Laparoscopic repair of spigelian hernia primarily, 09/13/16, Dr. Greer Pickerel  Wean per CCM  Appreciate [place] nurse attention to ostomy  TF's at "trophic" rate  Will follow        Earnstine Regal, MD, [facility]       Office: [phone]    Subjective: Patient awake and responsive on vent; nursing at bedside  Objective: Vital signs in last 24 hours: Temp:  [98.1 F (36.7 C)-99.2 F (37.3 C)] 98.5 F (36.9 C) (03/24 0335) Resp:  [18-41] 24 (03/24 0500) BP: (114-160)/(61-119) 140/81 (03/24 0500) SpO2:  [82 %-100 %] 98 % (03/24 0500) FiO2 (%):  [40 %] 40 % (03/24 0443) Weight:  [71 kg (156 lb 8.4 oz)] 71 kg (156 lb 8.4 oz) (03/24 0400) Last BM Date: 09/14/16  Intake/Output from previous day: 03/23 0701 - 03/24 0700 In: 2810.2 [I.V.:1943.5; NG/GT:266.8; IV Piggyback:600] Out: 930 [Urine:930] Intake/Output this shift: No intake/output data recorded.  Physical Exam: Abdomen - soft, mild distension; ostomy with edema, minimal output; incisions dry and intact  Lab Results:   Recent Labs  09/14/16 0325 09/15/16 0305  WBC 5.7 5.4  HGB 9.7* 10.1*  HCT 30.4* 30.5*  PLT 118* 118*   BMET  Recent Labs  09/14/16 0325 09/15/16 0305  NA 147* 144  K 3.7 2.7*  CL 121* 112*  CO2 18* 26  GLUCOSE 94 131*  BUN 12 10  CREATININE 0.89 0.64  CALCIUM 8.5* 8.1*   PT/INR No results for input(s): LABPROT, INR in the last 72 hours. Comprehensive Metabolic Panel:    Component Value Date/Time   NA 144 09/15/2016 0305   NA 147 (H) 09/14/2016 0325   K 2.7 (LL) 09/15/2016 0305   K 3.7 09/14/2016 0325   CL 112 (H) 09/15/2016 0305   CL 121 (H) 09/14/2016 0325   CO2 26 09/15/2016 0305   CO2 18 (L) 09/14/2016 0325   BUN 10 09/15/2016 0305   BUN 12 09/14/2016 0325   CREATININE 0.64 09/15/2016 0305   CREATININE 0.89 09/14/2016 0325   CREATININE 1.14 03/23/2013 1648   GLUCOSE 131 (H) 09/15/2016 0305   GLUCOSE 94 09/14/2016 0325   CALCIUM 8.1 (L) 09/15/2016 0305   CALCIUM 8.5 (L) 09/14/2016 0325   AST 44 (H) 09/13/2016 0533   AST 23 09/11/2016 0403   ALT 22 09/13/2016 0533   ALT 12 (L) 09/11/2016 0403   ALKPHOS 66 09/13/2016 0533   ALKPHOS 69 09/11/2016 0403   BILITOT 1.7 (H) 09/13/2016 0533   BILITOT 1.4 (H) 09/11/2016 0403   PROT 6.2 (L) 09/13/2016 0533   PROT 5.8 (L) 09/11/2016 0403   ALBUMIN 3.4 (L) 09/13/2016 0533   ALBUMIN 3.2 (L) 09/11/2016 0403    Studies/Results: Dg Chest Port 1 View  Result Date: 09/14/2016 CLINICAL DATA:  Hypoxia EXAM: PORTABLE CHEST 1 VIEW COMPARISON:  September 13, 2016 FINDINGS: Endotracheal tube tip is 1.3 cm above the carina. Nasogastric tube tip and side port are below the diaphragm with the side port seen within the stomach region. No pneumothorax. There is pleural effusion on the left with left lower lobe atelectasis. There may be a degree of superimposed pneumonia in the left base. Lungs elsewhere are clear. There is cardiomegaly with pulmonary vascularity within normal limits. No  evident adenopathy. No bone lesions. There is atherosclerotic calcification in the aorta. IMPRESSION: Tube and catheter positions as described without pneumothorax. Advise withdrawing endotracheal tube approximately 3 cm. Atelectasis and possible superimposed pneumonia left base with left pleural effusion, stable. Right lung clear. Stable cardiomegaly. There is aortic atherosclerosis. Note that in comparison with prior study, there has been resolution of subcutaneous emphysema. Electronically Signed   By: Lowella Grip III M.D.   On: 09/14/2016 07:12   Portable Chest Xray  Result Date: 09/13/2016 CLINICAL DATA:  Intubated. EXAM: PORTABLE CHEST 1 VIEW COMPARISON:  09/09/2016. FINDINGS:  Endotracheal tube tip at the carina. Interval nasogastric tube extending into the stomach. Stable enlarged cardiac silhouette. Interval patchy opacity at the left lung base and in the right perihilar region and medial right lung base. Interval bilateral subcutaneous emphysema. No pneumothorax or pneumoperitoneum seen. Diffuse osteopenia with stable evidence of a chronic rotator cuff tear on the right. IMPRESSION: 1. Endotracheal tube tip at the carina. It is recommended that this be retracted 4.5 cm. 2. Interval dense left basilar atelectasis or pneumonia and right perihilar and medial right lung base probable pneumonia. These results will be called to the ordering clinician or representative by the Radiologist Assistant, and communication documented in the PACS or zVision Dashboard. Electronically Signed   By: Claudie Revering M.D.   On: 09/13/2016 16:28      Hesper Venturella M 09/15/2016  Patient ID: Eduardo Deleon, male   DOB: 1924-12-01, 83 y.o.   MRN: [id number]

## 2016-09-15 NOTE — Progress Notes (Signed)
    83 y.o. male w/ PMH of SSS, permanent Afib, GERD, prostate Ca, sigmoid volvulus, PVD, CVA, dementia, and torticollis who presented to [facility] ED on 09/09/2016 with abd pain 2/2 recent sigmoid volvulus. Cards consulted for Afib with RVR and surgical clearance. Underwent sigmoid colectomy with colostomy on 09/13/2016  Dr Emily Filbert notes rounding note reviewed from yesterday. We will follow telemetry over the weekend.  Afib with RVR on dilt gtt for rate control, plan to continue until taking PO. He is currently intubated This patients CHA2DS2-VASc Score and unadjusted Ischemic Stroke Rate (% per year) is equal to 11.2 % stroke rate/year from a score of 7 (HTN, PAD, Age (2), CVA (2). Eliquis on hold, continue heparin. Call with questions.     Carlyle Dolly, M.D.

## 2016-09-15 NOTE — Progress Notes (Signed)
[place] Progress Note Patient Name: Eduardo Deleon DOB: 01/19/1925 MRN: [id number]   Date of Service  09/15/2016  HPI/Events of Note  Hypomag, hypokalemia, hypophos  eICU Interventions  Mag, potassium and phos replaced     Intervention Category Intermediate Interventions: Electrolyte abnormality - evaluation and management  Shakir Petrosino 09/15/2016, 4:38 AM

## 2016-09-15 NOTE — Progress Notes (Addendum)
PROGRESS NOTE NOT CONSULT AS LISTED ABOVE  Name: Eduardo Deleon MRN: [id number] DOB: 30-Jul-1924    ADMISSION DATE:  09/09/2016 CONSULTATION DATE: 3/20  REFERRING MD : [name]   CHIEF COMPLAINT:  Pre-op pulmonary/critical care risk assessment   BRIEF PATIENT DESCRIPTION:  This is a 83 year old male who resides at a SNF. He has a sig medical h/o AF, prior CVA, severe torticollis, mild dementia, BPH w/ slow growing prostate cancer, Chronic diastolic HF, chronic aspiration, CKD stage I and non-healing left ankle ulcer. Presented to ER 3/18 w/ abd pain and vomiting. Found to have recurrent sigmoid volvulus. He was seen by GI medicine w/ unsuccessful decompression attempt & it was felt that it could not be flipped endoscopically. He was seen by surgery who feels that surgical management would require permanent colostomy. Given his multiple medical co-morbids PCCM was asked to see and comment on pulmonary risk.    SIGNIFICANT EVENTS  OR for sigmoid resection and colostomy 3/22. Returned to ICU on vent   STUDIES:  3/18 CT abd: Recurrent distal sigmoid colon volvulus resulting in upstream colonic obstruction. 3/24 CXR: Small left effusion with atelectasis. Cardiomegaly. ETT is ok position.  I had reviewed the images personally  SUBJECTIVE:  No major issues overnight Tolerating pressure support weans 5/5 Heart rate still in the 120s.  VITAL SIGNS: Temp:  [98.1 F (36.7 C)-99.6 F (37.6 C)] 99.6 F (37.6 C) (03/24 0700) Pulse Rate:  [98] 98 (03/24 0755) Resp:  [18-41] 29 (03/24 0700) BP: (110-160)/(61-119) 113/75 (03/24 0755) SpO2:  [82 %-100 %] 98 % (03/24 0755) FiO2 (%):  [40 %] 40 % (03/24 0755) Weight:  [156 lb 8.4 oz (71 kg)] 156 lb 8.4 oz (71 kg) (03/24 0400)   Intake/Output Summary (Last 24 hours) at 09/15/16 1052 Last data filed at 09/15/16 0800  Gross per 24 hour  Intake           2564.5 ml  Output              730 ml  Net           1834.5 ml   Gen:      Old, frail, no  acute distress HEENT:  EOMI, sclera anicteric, lt torticollis Neck:     No masses; no thyromegaly Lungs:    Clear to auscultation bilaterally; normal respiratory effort CV:         Regular rate and rhythm; no murmurs Abd:      + bowel sounds; soft, non-tender; no palpable masses, no distension Ext:    No edema; adequate peripheral perfusion Skin:      Warm and dry; no rash Neuro: Awake and responsive  CBC Recent Labs     09/13/16  0533  09/14/16  0325  09/15/16  0305  WBC  4.7  5.7  5.4  HGB  10.2*  9.7*  10.1*  HCT  32.3*  30.4*  30.5*  PLT  106*  118*  118*    Coag's Recent Labs     09/14/16  0556  09/14/16  1354  09/15/16  0305  APTT  82*  77*  80*    BMET Recent Labs     09/13/16  0533  09/14/16  0325  09/15/16  0305  NA  148*  147*  144  K  4.1  3.7  2.7*  CL  119*  121*  112*  CO2  16*  18*  26  BUN  14  12  10  CREATININE  0.86  0.89  0.64  GLUCOSE  94  94  131*    Electrolytes Recent Labs     09/13/16  0533  09/14/16  0325  09/15/16  0305  CALCIUM  8.6*  8.5*  8.1*  MG   --   1.8  1.7  PHOS   --   1.2*  1.1*    Sepsis Markers No results for input(s): PROCALCITON, O2SATVEN in the last 72 hours.  Invalid input(s): LACTICACIDVEN  ABG Recent Labs     09/13/16  1625  09/13/16  1900  09/15/16  0455  PHART  7.215*  7.279*  7.564*  PCO2ART  37.1  33.5  30.4*  PO2ART  433*  195*  118*    Liver Enzymes Recent Labs     09/13/16  0533  AST  44*  ALT  22  ALKPHOS  66  BILITOT  1.7*  ALBUMIN  3.4*    Cardiac Enzymes No results for input(s): TROPONINI, PROBNP in the last 72 hours.  Glucose Recent Labs     09/13/16  1958  09/14/16  1206  09/14/16  1526  09/14/16  1917  09/14/16  2312  09/15/16  0333  GLUCAP  105*  120*  115*  123*  149*  132*    Imaging Dg Chest Port 1 View  Result Date: 09/15/2016 CLINICAL DATA:  Hypoxia EXAM: PORTABLE CHEST 1 VIEW COMPARISON:  September 14, 2016 FINDINGS: Endotracheal tube tip is 4.3 cm above  the carina. Nasogastric tube tip and side port are in the stomach. There is no pneumothorax. There are small pleural effusions bilaterally. Small right effusion appears new. There is atelectasis with suspected mild consolidation in the left base. Heart remains enlarged with pulmonary vascular within normal limits. No adenopathy. No bone lesions. There is aortic atherosclerosis. IMPRESSION: New small right pleural effusion all. Small left pleural effusion. There is left base atelectasis with questionable superimposed consolidation, likely early pneumonia. Stable cardiac enlargement. Tube positions as described without pneumothorax. There is aortic atherosclerosis. Electronically Signed   By: Lowella Grip III M.D.   On: 09/15/2016 08:00   Dg Chest Port 1 View  Result Date: 09/14/2016 CLINICAL DATA:  Hypoxia EXAM: PORTABLE CHEST 1 VIEW COMPARISON:  September 13, 2016 FINDINGS: Endotracheal tube tip is 1.3 cm above the carina. Nasogastric tube tip and side port are below the diaphragm with the side port seen within the stomach region. No pneumothorax. There is pleural effusion on the left with left lower lobe atelectasis. There may be a degree of superimposed pneumonia in the left base. Lungs elsewhere are clear. There is cardiomegaly with pulmonary vascularity within normal limits. No evident adenopathy. No bone lesions. There is atherosclerotic calcification in the aorta. IMPRESSION: Tube and catheter positions as described without pneumothorax. Advise withdrawing endotracheal tube approximately 3 cm. Atelectasis and possible superimposed pneumonia left base with left pleural effusion, stable. Right lung clear. Stable cardiomegaly. There is aortic atherosclerosis. Note that in comparison with prior study, there has been resolution of subcutaneous emphysema. Electronically Signed   By: Lowella Grip III M.D.   On: 09/14/2016 07:12   Portable Chest Xray  Result Date: 09/13/2016 CLINICAL DATA:  Intubated.  EXAM: PORTABLE CHEST 1 VIEW COMPARISON:  09/09/2016. FINDINGS: Endotracheal tube tip at the carina. Interval nasogastric tube extending into the stomach. Stable enlarged cardiac silhouette. Interval patchy opacity at the left lung base and in the right perihilar region and medial right lung base. Interval bilateral  subcutaneous emphysema. No pneumothorax or pneumoperitoneum seen. Diffuse osteopenia with stable evidence of a chronic rotator cuff tear on the right. IMPRESSION: 1. Endotracheal tube tip at the carina. It is recommended that this be retracted 4.5 cm. 2. Interval dense left basilar atelectasis or pneumonia and right perihilar and medial right lung base probable pneumonia. These results will be called to the ordering clinician or representative by the Radiologist Assistant, and communication documented in the PACS or zVision Dashboard. Electronically Signed   By: Claudie Revering M.D.   On: 09/13/2016 16:28   ASSESSMENT / PLAN:  Sigmoid volvulus now s/p sigmoid colectomy w/ colostomy Plan NPO Post op care per surgery   Ventilator dependence  Chronic aspiration (CT abd showing limited field of chest was clear) Torticollis.  -left intubated post op d/t residual anesthetics. Anticipated difficult airway although anesthesia note states grade I airway Plan Continue PSV weans Will optimize resp status before extubation.  Chronic dysphagia Plan Aspiration precautions after extunation  Hypernatremia  Hyperchloremic non gap acidosis  Hypernatremia Plan Off bicarb Start lasix for diuresis Replete lytes as tolerarated  CAF w/ CVR Plan Continue heparin cardizem drip for rate control Cardiology on board. Appreciate reccs  h/o prostate cancer; CRI stage I Plan Follow urine output and lytes   GERD Plan PPI  Mild chronic thrombocytopenia Anemia of chronic disease Plan Follow CBC Transfuse for Hb < 7  H/O CVA w/ Deconditioning Dementia  And very hard of hearing  Plan PT,  OT when extubated  Discussion: He will need further optimization before extubation. For today we will control afib better, correct phos, Na. Start lasix for diuresis. Discussed with his POA Mr. Lucy Antigua over the phone 3/24. He is clear that both Mr. Barbra Sarks and his wife would not want him to be reintubated. He is full DO NOT RESUSCITATE Our plan for today is to further optimize him as noted above and plan for an one-way extubation tomorrow. I'm hopeful that he'll do well post extubation.  The patient is critically ill with multiple organ system failure and requires high complexity decision making for assessment and support, frequent evaluation and titration of therapies, advanced monitoring, review of radiographic studies and interpretation of complex data.   Critical Care Time devoted to patient care services, exclusive of separately billable procedures, described in this note is 40 minutes.   Marshell Garfinkel MD [facility] Pager [phone] If no answer or after 3pm call: [phone] 09/15/2016, 10:52 AM

## 2016-09-15 NOTE — Progress Notes (Signed)
Pt [place] talked with this RN about not wanting to have the pt extubated on 3/25 unless contacted first.  This RN informed him that we would not extubate pt without having the MD contact him first and him agreeing to it.  Irven Baltimore, RN

## 2016-09-15 NOTE — Progress Notes (Signed)
[place] Progress Note Patient Name: Eduardo Deleon DOB: June 10, 1925 MRN: [id number]   Date of Service  09/15/2016  HPI/Events of Note  Mixed alkalosis on Bicarb gtt  eICU Interventions  D/C bicarb gtt Will need to monitor potassium since replacement orders placed     Intervention Category Major Interventions: Acid-Base disturbance - evaluation and management  Lacy Taglieri 09/15/2016, 5:15 AM

## 2016-09-15 NOTE — Progress Notes (Signed)
ANTICOAGULATION CONSULT NOTE - Follow Up Consult  Pharmacy Consult for heparin Indication: atrial fibrillation  No Known Allergies  Patient Measurements: Height: 5\' 6"  (167.6 cm) Weight: 156 lb 8.4 oz (71 kg) IBW/kg (Calculated) : 63.8  Heparin Dosing Weight: using total body weight  Vital Signs: Temp: 98.5 F (36.9 C) (03/24 0335) Temp Source: Oral (03/24 0335) BP: 110/73 (03/24 0700)  Labs:  Recent Labs  09/12/16 2359  09/13/16 0533 09/14/16 0325 09/14/16 0556 09/14/16 1354 09/15/16 0305  HGB  --   < > 10.2* 9.7*  --   --  10.1*  HCT  --   --  32.3* 30.4*  --   --  30.5*  PLT  --   --  106* 118*  --   --  118*  APTT 83*  --   --   --  82* 77* 80*  HEPARINUNFRC 1.38*  --   --   --  0.99*  --  0.97*  CREATININE  --   --  0.86 0.89  --   --  0.64  < > = values in this interval not displayed.  Estimated Creatinine Clearance: 56.5 mL/min (by C-G formula based on SCr of 0.64 mg/dL).   Assessment: 83yo M admitted with N/V and abdominal pain on apixaban for A.fib. Recurrent volvulus was unable to be resolved endoscopically. Pharmacy is asked to start heparin infusion while apixaban on hold.  First heparin level (anti-Xa level) high as expected given recent apixaban use.  Due to the effects of apixaban on heparin levels, using aPTT to guide therapy until levels correlate (effects of apixaban diminished).  Last dose of apixaban reported as 3/18 at 0900.    Significant events: 3/22 lap sigmoid colectomy with end-colostomy, repair of spigelian hernia. Heparin infusion resumed ~6 hours postop per surgery's instructions without bolus.  Today, 09/15/2016: - This morning's heparin level 0.97 and aPTT 80 seconds with heparin rate at 700 units/hr.  Currently using aPTT levels to titrate heparin infusion.  Heparin level trending down.  Should begin to correlate soon as apixaban effects diminish. This morning's aPTT level therapeutic. - Hgb and platelets both low but stable.  - No  bleeding documented.   Goal of Therapy:  aPTT 66-102 sec Heparin level 0.3-0.7 units/ml Monitor platelets by anticoagulation protocol: Yes   Plan:  Continue heparin infusion at 700 units/hr Check aPTT and heparin level tomorrow morning. Check CBC daily while on heparin. F/u daily.  Hershal Coria, PharmD 09/15/2016 7:58 AM

## 2016-09-15 NOTE — Progress Notes (Signed)
[facility] Progress Note Patient Name: Eduardo Deleon DOB: Nov 28, 1924 MRN: [id number]   Date of Service  09/15/2016  HPI/Events of Note  Low potassium replaced  eICU Interventions       Intervention Category Minor Interventions: Electrolytes abnormality - evaluation and management  Mauri Brooklyn, P 09/15/2016, 9:15 PM

## 2016-09-15 NOTE — Progress Notes (Signed)
CRITICAL VALUE ALERT  Critical value received:  Potassium - 2.7  Date of notification:  09/15/2016  Time of notification:  3953  Critical value read back:Yes.    Nurse who received alert:  Rupert Stacks  MD notified (1st page):  Dr. Jimmy Footman  Time of first page:  [phone]  Responding MD:  Dr. Jimmy Footman  Time MD responded:  [phone]

## 2016-09-16 ENCOUNTER — Inpatient Hospital Stay (HOSPITAL_COMMUNITY): Payer: Medicare Other

## 2016-09-16 LAB — CBC
HCT: 30 % — ABNORMAL LOW (ref 39.0–52.0)
Hemoglobin: 10.1 g/dL — ABNORMAL LOW (ref 13.0–17.0)
MCH: 29.6 pg (ref 26.0–34.0)
MCHC: 33.7 g/dL (ref 30.0–36.0)
MCV: 88 fL (ref 78.0–100.0)
PLATELETS: 121 10*3/uL — AB (ref 150–400)
RBC: 3.41 MIL/uL — ABNORMAL LOW (ref 4.22–5.81)
RDW: 18.3 % — AB (ref 11.5–15.5)
WBC: 6.3 10*3/uL (ref 4.0–10.5)

## 2016-09-16 LAB — BASIC METABOLIC PANEL
Anion gap: 6 (ref 5–15)
BUN: 13 mg/dL (ref 6–20)
CALCIUM: 7.9 mg/dL — AB (ref 8.9–10.3)
CO2: 28 mmol/L (ref 22–32)
CREATININE: 0.76 mg/dL (ref 0.61–1.24)
Chloride: 107 mmol/L (ref 101–111)
GFR calc Af Amer: >60 mL/min (ref >60)
GFR calc non Af Amer: >60 mL/min (ref >60)
GLUCOSE: 110 mg/dL — AB (ref 65–99)
Potassium: 3.6 mmol/L (ref 3.5–5.1)
Sodium: 141 mmol/L (ref 135–145)

## 2016-09-16 LAB — GLUCOSE, CAPILLARY
GLUCOSE-CAPILLARY: 114 mg/dL — AB (ref 65–99)
GLUCOSE-CAPILLARY: 121 mg/dL — AB (ref 65–99)
Glucose-Capillary: 106 mg/dL — ABNORMAL HIGH (ref 65–99)
Glucose-Capillary: 110 mg/dL — ABNORMAL HIGH (ref 65–99)
Glucose-Capillary: 112 mg/dL — ABNORMAL HIGH (ref 65–99)

## 2016-09-16 LAB — HEPARIN LEVEL (UNFRACTIONATED)
HEPARIN UNFRACTIONATED: 1.94 [IU]/mL — AB (ref 0.30–0.70)
Heparin Unfractionated: 1.18 IU/mL — ABNORMAL HIGH (ref 0.30–0.70)

## 2016-09-16 LAB — BLOOD GAS, ARTERIAL
Acid-Base Excess: 7 mmol/L — ABNORMAL HIGH (ref 0.0–2.0)
Bicarbonate: 28.7 mmol/L — ABNORMAL HIGH (ref 20.0–28.0)
DRAWN BY: 232811
FIO2: 30
MECHVT: 510 mL
O2 Saturation: 97.6 %
PATIENT TEMPERATURE: 98.5
PCO2 ART: 29.9 mmHg — AB (ref 32.0–48.0)
PEEP/CPAP: 5 cmH2O
PO2 ART: 86.5 mmHg (ref 83.0–108.0)
RATE: 18 resp/min
pH, Arterial: 7.587 — ABNORMAL HIGH (ref 7.350–7.450)

## 2016-09-16 LAB — MAGNESIUM: Magnesium: 2.1 mg/dL (ref 1.7–2.4)

## 2016-09-16 LAB — PHOSPHORUS
PHOSPHORUS: 3 mg/dL (ref 2.5–4.6)
PHOSPHORUS: 4 mg/dL (ref 2.5–4.6)

## 2016-09-16 LAB — APTT: aPTT: 78 seconds — ABNORMAL HIGH (ref 24–36)

## 2016-09-16 LAB — PROCALCITONIN: Procalcitonin: 0.12 ng/mL

## 2016-09-16 MED ORDER — ACETAZOLAMIDE SODIUM 500 MG IJ SOLR
250.0000 mg | Freq: Once | INTRAMUSCULAR | Status: AC
Start: 2016-09-16 — End: 2016-09-16
  Administered 2016-09-16: 250 mg via INTRAVENOUS
  Filled 2016-09-16: qty 500

## 2016-09-16 MED ORDER — POTASSIUM CHLORIDE 20 MEQ/15ML (10%) PO SOLN: 40.0000 meq | Freq: Once | ORAL | Status: DC

## 2016-09-16 MED ORDER — POTASSIUM PHOSPHATES 15 MMOLE/5ML IV SOLN
40.0000 meq | Freq: Once | INTRAVENOUS | Status: AC
  Administered 2016-09-16: 40 meq via INTRAVENOUS
  Filled 2016-09-16: qty 9.09

## 2016-09-16 MED ORDER — HEPARIN (PORCINE) IN NACL 100-0.45 UNIT/ML-% IJ SOLN
700.0000 [IU]/h | INTRAMUSCULAR | Status: DC
  Administered 2016-09-16 – 2016-09-18 (×3): 700 [IU]/h via INTRAVENOUS
  Filled 2016-09-16 (×4): qty 250

## 2016-09-16 MED ORDER — HEPARIN (PORCINE) IN NACL 100-0.45 UNIT/ML-% IJ SOLN
600.0000 [IU]/h | INTRAMUSCULAR | Status: DC
  Filled 2016-09-16: qty 250

## 2016-09-16 NOTE — Progress Notes (Addendum)
PROGRESS NOTE NOT CONSULT AS LISTED ABOVE  Name: Eduardo Deleon MRN: [id number] DOB: Oct 04, 1924    ADMISSION DATE:  09/09/2016 CONSULTATION DATE: 3/20  REFERRING MD : [name]   CHIEF COMPLAINT:  Pre-op pulmonary/critical care risk assessment   BRIEF PATIENT DESCRIPTION:  This is a 83 year old male who resides at a SNF. He has a sig medical h/o AF, prior CVA, severe torticollis, mild dementia, BPH w/ slow growing prostate cancer, Chronic diastolic HF, chronic aspiration, CKD stage I and non-healing left ankle ulcer. Presented to ER 3/18 w/ abd pain and vomiting. Found to have recurrent sigmoid volvulus. He was seen by GI medicine w/ unsuccessful decompression attempt & it was felt that it could not be flipped endoscopically. He was seen by surgery who feels that surgical management would require permanent colostomy. Given his multiple medical co-morbids PCCM was asked to see and comment on pulmonary risk.    SIGNIFICANT EVENTS  OR for sigmoid resection and colostomy 3/22. Returned to ICU on vent   STUDIES:  3/18 CT abd: Recurrent distal sigmoid colon volvulus resulting in upstream colonic obstruction. 3/24 CXR: Small left effusion with atelectasis. Cardiomegaly. ETT is ok position.  I had reviewed the images personally  SUBJECTIVE:  No major issues Doing well on weaning yesterday  VITAL SIGNS: Temp:  [98.3 F (36.8 C)-99.9 F (37.7 C)] 98.9 F (37.2 C) (03/25 0700) Pulse Rate:  [70-73] 73 (03/25 0345) Resp:  [14-58] 20 (03/25 1100) BP: (92-147)/(57-107) 103/62 (03/25 1100) SpO2:  [84 %-100 %] 98 % (03/25 1100) FiO2 (%):  [30 %-40 %] 30 % (03/25 0744) Weight:  [153 lb 14.1 oz (69.8 kg)] 153 lb 14.1 oz (69.8 kg) (03/25 0402)   Intake/Output Summary (Last 24 hours) at 09/16/16 1156 Last data filed at 09/16/16 1100  Gross per 24 hour  Intake              709 ml  Output             3050 ml  Net            -2341 ml   Blood pressure 103/62, pulse 73, temperature 98.9 F (37.2  C), temperature source Axillary, resp. rate 20, height 5\' 6"  (1.676 m), weight 153 lb 14.1 oz (69.8 kg), SpO2 98 %. Gen:      Old frail, no acute distress HEENT:  EOMI, sclera anicteric Neck:     No masses; no thyromegaly Lungs:    Clear to auscultation bilaterally; normal respiratory effort CV:         Regular rate and rhythm; no murmurs Abd:      + bowel sounds; soft, non-tender; no palpable masses, no distension Ext:    No edema; adequate peripheral perfusion Skin:      Warm and dry; no rash Neuro: Awake, responsive  CBC Recent Labs     09/14/16  0325  09/15/16  0305  09/16/16  0319  WBC  5.7  5.4  6.3  HGB  9.7*  10.1*  10.1*  HCT  30.4*  30.5*  30.0*  PLT  118*  118*  121*    Coag's Recent Labs     09/14/16  1354  09/15/16  0305  09/16/16  0319  APTT  77*  80*  >200*    BMET Recent Labs     09/15/16  0305  09/15/16  1604  09/16/16  0319  NA  144  142  141  K  2.7*  3.9  3.6  CL  112*  110  107  CO2  26  26  28   BUN  10  12  13   CREATININE  0.64  0.79  0.76  GLUCOSE  131*  152*  110*    Electrolytes Recent Labs     09/14/16  0325  09/15/16  0305  09/15/16  1604  09/16/16  0319  CALCIUM  8.5*  8.1*  8.1*  7.9*  MG  1.8  1.7  2.3  2.1  PHOS  1.2*  1.1*   --   3.0    Sepsis Markers Recent Labs     09/15/16  1042  09/16/16  0319  PROCALCITON  0.12  0.12    ABG Recent Labs     09/13/16  1900  09/15/16  0455  09/16/16  0405  PHART  7.279*  7.564*  7.587*  PCO2ART  33.5  30.4*  29.9*  PO2ART  195*  118*  86.5    Liver Enzymes No results for input(s): AST, ALT, ALKPHOS, BILITOT, ALBUMIN in the last 72 hours.  Cardiac Enzymes No results for input(s): TROPONINI, PROBNP in the last 72 hours.  Glucose Recent Labs     09/15/16  0333  09/15/16  1235  09/15/16  1547  09/15/16  1947  09/16/16  0512  09/16/16  0813  GLUCAP  132*  136*  141*  119*  112*  110*    Imaging Dg Chest Port 1 View  Result Date: 09/16/2016 CLINICAL DATA:   Patient with acute respiratory failure. History of CHF. EXAM: PORTABLE CHEST 1 VIEW COMPARISON:  Chest radiograph 09/15/2016. FINDINGS: ET tube terminates in the mid trachea. Enteric tube tip and side-port project in the stomach. Stable cardiomegaly. Soft tissues overlie the upper thorax, limiting evaluation. Persistent heterogeneous opacities mid lower lungs bilaterally. Small bilateral pleural effusions. IMPRESSION: ET tube terminates in the mid trachea. Persistent mid lower lung heterogeneous opacities. Considerations include pneumonia or atelectasis. Small bilateral pleural effusions. Electronically Signed   By: Lovey Newcomer M.D.   On: 09/16/2016 07:09   Dg Chest Port 1 View  Result Date: 09/15/2016 CLINICAL DATA:  Hypoxia EXAM: PORTABLE CHEST 1 VIEW COMPARISON:  September 14, 2016 FINDINGS: Endotracheal tube tip is 4.3 cm above the carina. Nasogastric tube tip and side port are in the stomach. There is no pneumothorax. There are small pleural effusions bilaterally. Small right effusion appears new. There is atelectasis with suspected mild consolidation in the left base. Heart remains enlarged with pulmonary vascular within normal limits. No adenopathy. No bone lesions. There is aortic atherosclerosis. IMPRESSION: New small right pleural effusion all. Small left pleural effusion. There is left base atelectasis with questionable superimposed consolidation, likely early pneumonia. Stable cardiac enlargement. Tube positions as described without pneumothorax. There is aortic atherosclerosis. Electronically Signed   By: Lowella Grip III M.D.   On: 09/15/2016 08:00   ASSESSMENT / PLAN:  Sigmoid volvulus now s/p sigmoid colectomy w/ colostomy Plan Tube feeds Post op care per surgery  Ventilator dependence  Chronic aspiration (CT abd showing limited field of chest was clear) Torticollis.  -left intubated post op d/t residual anesthetics. Anticipated difficult airway although anesthesia note states grade  I airway Plan Continue PSV weans Optimize resp status before extubation Would give one more day of diuresis  Chronic dysphagia Plan Aspiration precautions after extubation  Hypernatremia  Hyperchloremic non gap acidosis  Hypernatremia Plan Continue lasix for diuresis  CAF w/ CVR Plan Continue heparin and Cardizem gtt  h/o prostate cancer; CRI stage I Plan Follow urine lytes, and Cr  GERD Plan PPI  Mild chronic thrombocytopenia Anemia of chronic disease Plan Follow CBC Transfuse for Hb <7  H/O CVA w/ Deconditioning Dementia  And very hard of hearing  Plan PT, OT after extubation  Discussion: He will need further optimization before extubation. For today we will control afib better, continue lasix for diuresis. Discussed with his POA Mr. Lucy Antigua over the phone 3/24 and 3/25. After reflection he is not sure if he want to make Mr. Hietala a full DNR. The living will states that DNR/DNI in case of terminal condition, vegetative state. He will come in tomorrow to discuss further with the team.   The patient is critically ill with multiple organ system failure and requires high complexity decision making for assessment and support, frequent evaluation and titration of therapies, advanced monitoring, review of radiographic studies and interpretation of complex data.   Critical Care Time devoted to patient care services, exclusive of separately billable procedures, described in this note is 40 minutes.   Marshell Garfinkel MD [facility] Pager [phone] If no answer or after 3pm call: [phone] 09/16/2016, 12:02 PM

## 2016-09-16 NOTE — Progress Notes (Addendum)
ANTICOAGULATION CONSULT NOTE - Follow Up Consult  Pharmacy Consult for heparin Indication: atrial fibrillation  No Known Allergies  Patient Measurements: Height: 5\' 6"  (167.6 cm) Weight: 156 lb 8.4 oz (71 kg) IBW/kg (Calculated) : 63.8  Heparin Dosing Weight: using total body weight  Vital Signs: Temp: 98.3 F (36.8 C) (03/25 0400) Temp Source: Axillary (03/25 0400) BP: 104/73 (03/25 0345) Pulse Rate: 73 (03/25 0345)  Labs:  Recent Labs  09/14/16 0325  09/14/16 0556 09/14/16 1354 09/15/16 0305 09/15/16 1604 09/16/16 0319  HGB 9.7*  --   --   --  10.1*  --  10.1*  HCT 30.4*  --   --   --  30.5*  --  30.0*  PLT 118*  --   --   --  118*  --  121*  APTT  --   < > 82* 77* 80*  --  >200*  HEPARINUNFRC  --   --  0.99*  --  0.97*  --  1.94*  CREATININE 0.89  --   --   --  0.64 0.79 0.76  < > = values in this interval not displayed.  Estimated Creatinine Clearance: 56.5 mL/min (by C-G formula based on SCr of 0.76 mg/dL).   Assessment: 83yo M admitted with N/V and abdominal pain on apixaban for A.fib. Recurrent volvulus was unable to be resolved endoscopically. Pharmacy is asked to start heparin infusion while apixaban on hold.  First heparin level (anti-Xa level) high as expected given recent apixaban use.  Due to the effects of apixaban on heparin levels, using aPTT to guide therapy until levels correlate (effects of apixaban diminished).  Last dose of apixaban reported as 3/18 at 0900.    Significant events: 3/22 lap sigmoid colectomy with end-colostomy, repair of spigelian hernia. Heparin infusion resumed ~6 hours postop per surgery's instructions without bolus.  3/24 - This morning's heparin level 0.97 and aPTT 80 seconds with heparin rate at 700 units/hr.  Currently using aPTT levels to titrate heparin infusion.  Heparin level trending down.  Should begin to correlate soon as apixaban effects diminish. This morning's aPTT level therapeutic. - Hgb and platelets both low  but stable.  - No bleeding documented. Today, 3/25 -aptt=>200 and HL=1.94- spoke with Tasha from lab and she confirmed she drew from right arm and RN Rob says heparin running in left arm???  Unsure why lab is now so abnormal after several days at therapeutic aptts?   Goal of Therapy:  aPTT 66-102 sec Heparin level 0.3-0.7 units/ml Monitor platelets by anticoagulation protocol: Yes   Plan:  Will hold heparin drip x 1 hr Decrease heparin drip to 600 units/hr Recheck aptt and HL in 6 hours vs 8 hours Check CBC daily while on heparin.   Dorrene German, PharmD 09/16/2016 4:38 AM   Addendum:  Leonor Liv RN called 0530 said heparin drip was running in right arm- apparently tubing was crossing body and he realized when bathing pt.   Heparin has been on hold x 1 hr.  Plan: Resume heparin drip now at previous rate of 700 units/hr Recheck HL and aptt in 6 hours  Thanks Dorrene German 09/16/2016 5:42 AM

## 2016-09-16 NOTE — Progress Notes (Signed)
General Surgery [facility]  Assessment & Plan:  Recurrent sigmoid volvulus, Right lower quadrant spigelian hernia POD#3             s/p Laparoscopic-assisted sigmoid colectomy with end-colostomy, Hartmann's procedure             s/p Laparoscopic repair of spigelian hernia primarily, 09/13/16, Dr. Greer Pickerel             Wean per CCM - hoping to one-way extubate today if optimized             Appreciate WOC nurse attention to ostomy             TF's at "trophic" rate             Will follow       Earnstine Regal, MD, [facility]       Office: [phone]  Subjective: Patient responsive to voice; intubated in ICU  Objective: Vital signs in last 24 hours: Temp:  [98.3 F (36.8 C)-99.9 F (37.7 C)] 98.3 F (36.8 C) (03/25 0500) Pulse Rate:  [70-73] 73 (03/25 0345) Resp:  [18-58] 23 (03/25 0700) BP: (92-147)/(57-107) 108/70 (03/25 0700) SpO2:  [84 %-100 %] 99 % (03/25 0700) FiO2 (%):  [30 %-40 %] 30 % (03/25 0744) Weight:  [69.8 kg (153 lb 14.1 oz)] 69.8 kg (153 lb 14.1 oz) (03/25 0402) Last BM Date: 09/15/16  Intake/Output from previous day: 03/24 0701 - 03/25 0700 In: 677 [I.V.:332; NG/GT:345] Out: 3100 [Urine:3100] Intake/Output this shift: No intake/output data recorded.  Physical Exam: HEENT - sclerae clear, mucous membranes moist Neck - torticollis (severe) Abdomen - soft, mild distension; stoma viable with less edema, small gas and fluid in bag; incision dry and intact   Lab Results:   Recent Labs  09/15/16 0305 09/16/16 0319  WBC 5.4 6.3  HGB 10.1* 10.1*  HCT 30.5* 30.0*  PLT 118* 121*   BMET  Recent Labs  09/15/16 1604 09/16/16 0319  NA 142 141  K 3.9 3.6  CL 110 107  CO2 26 28  GLUCOSE 152* 110*  BUN 12 13  CREATININE 0.79 0.76  CALCIUM 8.1* 7.9*   PT/INR No results for input(s): LABPROT, INR in the last 72 hours. Comprehensive Metabolic Panel:    Component Value Date/Time   NA 141  09/16/2016 0319   NA 142 09/15/2016 1604   K 3.6 09/16/2016 0319   K 3.9 09/15/2016 1604   CL 107 09/16/2016 0319   CL 110 09/15/2016 1604   CO2 28 09/16/2016 0319   CO2 26 09/15/2016 1604   BUN 13 09/16/2016 0319   BUN 12 09/15/2016 1604   CREATININE 0.76 09/16/2016 0319   CREATININE 0.79 09/15/2016 1604   CREATININE 1.14 03/23/2013 1648   GLUCOSE 110 (H) 09/16/2016 0319   GLUCOSE 152 (H) 09/15/2016 1604   CALCIUM 7.9 (L) 09/16/2016 0319   CALCIUM 8.1 (L) 09/15/2016 1604   AST 44 (H) 09/13/2016 0533   AST 23 09/11/2016 0403   ALT 22 09/13/2016 0533   ALT 12 (L) 09/11/2016 0403   ALKPHOS 66 09/13/2016 0533   ALKPHOS 69 09/11/2016 0403   BILITOT 1.7 (H) 09/13/2016 0533   BILITOT 1.4 (H) 09/11/2016 0403   PROT 6.2 (L) 09/13/2016 0533   PROT 5.8 (L) 09/11/2016 0403   ALBUMIN 3.4 (L) 09/13/2016 0533   ALBUMIN 3.2 (L) 09/11/2016 0403    Studies/Results: Dg Chest [employer]  1 View  Result Date: 09/16/2016 CLINICAL DATA:  Patient with acute respiratory failure. History of CHF. EXAM: PORTABLE CHEST 1 VIEW COMPARISON:  Chest radiograph 09/15/2016. FINDINGS: ET tube terminates in the mid trachea. Enteric tube tip and side-port project in the stomach. Stable cardiomegaly. Soft tissues overlie the upper thorax, limiting evaluation. Persistent heterogeneous opacities mid lower lungs bilaterally. Small bilateral pleural effusions. IMPRESSION: ET tube terminates in the mid trachea. Persistent mid lower lung heterogeneous opacities. Considerations include pneumonia or atelectasis. Small bilateral pleural effusions. Electronically Signed   By: Lovey Newcomer M.D.   On: 09/16/2016 07:09   Dg Chest Port 1 View  Result Date: 09/15/2016 CLINICAL DATA:  Hypoxia EXAM: PORTABLE CHEST 1 VIEW COMPARISON:  September 14, 2016 FINDINGS: Endotracheal tube tip is 4.3 cm above the carina. Nasogastric tube tip and side port are in the stomach. There is no pneumothorax. There are small pleural effusions bilaterally. Small  right effusion appears new. There is atelectasis with suspected mild consolidation in the left base. Heart remains enlarged with pulmonary vascular within normal limits. No adenopathy. No bone lesions. There is aortic atherosclerosis. IMPRESSION: New small right pleural effusion all. Small left pleural effusion. There is left base atelectasis with questionable superimposed consolidation, likely early pneumonia. Stable cardiac enlargement. Tube positions as described without pneumothorax. There is aortic atherosclerosis. Electronically Signed   By: Lowella Grip III M.D.   On: 09/15/2016 08:00      Eduardo Deleon M 09/16/2016  Patient ID: Eduardo Deleon, male   DOB: Nov 19, 1924, 83 y.o.   MRN: [id number]

## 2016-09-16 NOTE — Progress Notes (Signed)
CRITICAL VALUE ALERT  Critical value received:  pTT > 200  Date of notification:  09/16/2016  Time of notification:  0412  Critical value read back:Yes.    Nurse who received alert:  Lenny Pastel., RN  MD notified (1st page):  [email or web address]  Time of first page:  [phone]  MD notified (2nd page):  Time of second page:  Responding MD:  E-Link  Time MD responded:  [phone]

## 2016-09-16 NOTE — Progress Notes (Signed)
Informed [facility] about Coag results.  Irven [place], RN

## 2016-09-16 NOTE — Progress Notes (Signed)
ANTICOAGULATION CONSULT NOTE - Follow Up Consult  Pharmacy Consult for heparin Indication: atrial fibrillation  No Known Allergies  Patient Measurements: Height: 5\' 6"  (167.6 cm) Weight: 153 lb 14.1 oz (69.8 kg) IBW/kg (Calculated) : 63.8  Heparin Dosing Weight: using total body weight  Vital Signs: Temp: 98.9 F (37.2 C) (03/25 0700) Temp Source: Axillary (03/25 0700) BP: 108/70 (03/25 0700) Pulse Rate: 73 (03/25 0345)  Labs:  Recent Labs  09/14/16 0325  09/14/16 0556 09/14/16 1354 09/15/16 0305 09/15/16 1604 09/16/16 0319  HGB 9.7*  --   --   --  10.1*  --  10.1*  HCT 30.4*  --   --   --  30.5*  --  30.0*  PLT 118*  --   --   --  118*  --  121*  APTT  --   < > 82* 77* 80*  --  >200*  HEPARINUNFRC  --   --  0.99*  --  0.97*  --  1.94*  CREATININE 0.89  --   --   --  0.64 0.79 0.76  < > = values in this interval not displayed.  Estimated Creatinine Clearance: 56.5 mL/min (by C-G formula based on SCr of 0.76 mg/dL).   Assessment: 82yo M admitted with N/V and abdominal pain on apixaban for A.fib. Recurrent volvulus was unable to be resolved endoscopically. Pharmacy is asked to start heparin infusion while apixaban on hold.  First heparin level (anti-Xa level) high as expected given recent apixaban use.  Due to the effects of apixaban on heparin levels, using aPTT to guide therapy until levels correlate (effects of apixaban diminished).  Last dose of apixaban reported as 3/18 at 0900.    Significant events: 3/22 lap sigmoid colectomy with end-colostomy, repair of spigelian hernia. Heparin infusion resumed ~6 hours postop per surgery's instructions without bolus.  Today, 09/16/2016: - This morning's lab drawn inappropriately from same side heparin was infusing.  Labs redrawn this afternoon following resumption of heparin = heparin level 1.18 and aPTT 78 seconds with heparin rate at 700 units/hr.  Patient's aPTT has been therapeutic at this rate for several days. Currently  using aPTT levels to titrate heparin infusion.  Heparin levels should begin to correlate soon as apixaban effects diminish.  - Hgb and platelets both low but stable.  - No bleeding documented.   Goal of Therapy:  aPTT 66-102 sec Heparin level 0.3-0.7 units/ml Monitor platelets by anticoagulation protocol: Yes   Plan:  Continue heparin infusion at 700 units/hr Check aPTT and heparin level tomorrow morning. Check CBC daily while on heparin. F/u for transition back to Eliquis once extubated.  Hershal Coria, PharmD 09/16/2016 9:32 AM

## 2016-09-16 NOTE — Progress Notes (Signed)
Telemetry reviewed, afib overall rate controlled. Continue dilt gtt, once extubated can work to convert to oral regiment and restart oral anticoagulation, currently on heparin gtt. No further cardiology recs today, call with questions.   Zandra Abts MD

## 2016-09-17 ENCOUNTER — Inpatient Hospital Stay (HOSPITAL_COMMUNITY): Payer: Medicare Other

## 2016-09-17 DIAGNOSIS — I482 Chronic atrial fibrillation: Secondary | ICD-10-CM

## 2016-09-17 DIAGNOSIS — E876 Hypokalemia: Secondary | ICD-10-CM

## 2016-09-17 DIAGNOSIS — J9601 Acute respiratory failure with hypoxia: Secondary | ICD-10-CM

## 2016-09-17 LAB — BLOOD GAS, ARTERIAL
ACID-BASE EXCESS: 6.5 mmol/L — AB (ref 0.0–2.0)
BICARBONATE: 28.1 mmol/L — AB (ref 20.0–28.0)
Drawn by: 232811
FIO2: 30
LHR: 18 {breaths}/min
O2 Saturation: 98 %
PATIENT TEMPERATURE: 98.6
PEEP/CPAP: 5 cmH2O
PH ART: 7.588 — AB (ref 7.350–7.450)
VT: 510 mL
pCO2 arterial: 29.3 mmHg — ABNORMAL LOW (ref 32.0–48.0)
pO2, Arterial: 96.7 mmHg (ref 83.0–108.0)

## 2016-09-17 LAB — MAGNESIUM: Magnesium: 2 mg/dL (ref 1.7–2.4)

## 2016-09-17 LAB — BASIC METABOLIC PANEL
ANION GAP: 10 (ref 5–15)
BUN: 17 mg/dL (ref 6–20)
CALCIUM: 8.4 mg/dL — AB (ref 8.9–10.3)
CHLORIDE: 102 mmol/L (ref 101–111)
CO2: 27 mmol/L (ref 22–32)
Creatinine, Ser: 0.92 mg/dL (ref 0.61–1.24)
GFR calc non Af Amer: >60 mL/min (ref >60)
GLUCOSE: 103 mg/dL — AB (ref 65–99)
Potassium: 3.2 mmol/L — ABNORMAL LOW (ref 3.5–5.1)
Sodium: 139 mmol/L (ref 135–145)

## 2016-09-17 LAB — CBC
HEMATOCRIT: 32.5 % — AB (ref 39.0–52.0)
Hemoglobin: 10.8 g/dL — ABNORMAL LOW (ref 13.0–17.0)
MCH: 29 pg (ref 26.0–34.0)
MCHC: 33.2 g/dL (ref 30.0–36.0)
MCV: 87.4 fL (ref 78.0–100.0)
Platelets: 150 10*3/uL (ref 150–400)
RBC: 3.72 MIL/uL — ABNORMAL LOW (ref 4.22–5.81)
RDW: 17.8 % — ABNORMAL HIGH (ref 11.5–15.5)
WBC: 6.3 10*3/uL (ref 4.0–10.5)

## 2016-09-17 LAB — GLUCOSE, CAPILLARY
GLUCOSE-CAPILLARY: 128 mg/dL — AB (ref 65–99)
GLUCOSE-CAPILLARY: 108 mg/dL — AB (ref 65–99)
GLUCOSE-CAPILLARY: 101 mg/dL — AB (ref 65–99)
Glucose-Capillary: 110 mg/dL — ABNORMAL HIGH (ref 65–99)
Glucose-Capillary: 98 mg/dL (ref 65–99)
Glucose-Capillary: 106 mg/dL — ABNORMAL HIGH (ref 65–99)
Glucose-Capillary: 97 mg/dL (ref 65–99)

## 2016-09-17 LAB — APTT: APTT: 69 s — AB (ref 24–36)

## 2016-09-17 LAB — PROCALCITONIN: Procalcitonin: 0.13 ng/mL

## 2016-09-17 LAB — PHOSPHORUS: PHOSPHORUS: 4.7 mg/dL — AB (ref 2.5–4.6)

## 2016-09-17 LAB — HEPARIN LEVEL (UNFRACTIONATED): Heparin Unfractionated: 1 IU/mL — ABNORMAL HIGH (ref 0.30–0.70)

## 2016-09-17 MED ORDER — POTASSIUM CHLORIDE 10 MEQ/100ML IV SOLN
10.0000 meq | INTRAVENOUS | Status: AC
  Administered 2016-09-17 (×2): 10 meq via INTRAVENOUS
  Filled 2016-09-17 (×3): qty 100

## 2016-09-17 MED ORDER — MUPIROCIN 2 % EX OINT
1.0000 "application " | TOPICAL_OINTMENT | Freq: Two times a day (BID) | CUTANEOUS | Status: DC
  Administered 2016-09-17 – 2016-09-18 (×3): 1 via NASAL

## 2016-09-17 MED ORDER — PRO-STAT SUGAR FREE PO LIQD
30.0000 mL | Freq: Every day | ORAL | Status: DC
  Administered 2016-09-17: 30 mL
  Filled 2016-09-17: qty 30

## 2016-09-17 MED ORDER — SODIUM CHLORIDE 0.9 % IV SOLN: 30.0000 meq | Freq: Once | INTRAVENOUS | Status: DC

## 2016-09-17 MED ORDER — FUROSEMIDE 10 MG/ML IJ SOLN
40.0000 mg | Freq: Every day | INTRAMUSCULAR | Status: AC
  Administered 2016-09-17: 40 mg via INTRAVENOUS
  Filled 2016-09-17: qty 4

## 2016-09-17 MED ORDER — FUROSEMIDE 10 MG/ML IJ SOLN: 40.0000 mg | Freq: Every day | INTRAMUSCULAR | Status: DC

## 2016-09-17 MED ORDER — FREE WATER
100.0000 mL | Freq: Four times a day (QID) | Status: DC
Start: 2016-09-17 — End: 2016-09-18
  Administered 2016-09-17: 100 mL

## 2016-09-17 MED ORDER — CHLORHEXIDINE GLUCONATE CLOTH 2 % EX PADS
6.0000 | MEDICATED_PAD | Freq: Every day | CUTANEOUS | Status: DC
  Administered 2016-09-18: 6 via TOPICAL

## 2016-09-17 MED ORDER — POTASSIUM CHLORIDE 20 MEQ/15ML (10%) PO SOLN: 40.0000 meq | Freq: Once | ORAL | Status: DC

## 2016-09-17 MED ORDER — MIDAZOLAM HCL 2 MG/2ML IJ SOLN: 1.0000 mg | INTRAMUSCULAR | Status: DC | PRN

## 2016-09-17 MED ORDER — OSMOLITE 1.2 CAL PO LIQD: 1000.0000 mL | ORAL | Status: DC

## 2016-09-17 NOTE — Progress Notes (Signed)
PROGRESS NOTE NOT CONSULT AS LISTED ABOVE  Name: Eduardo Deleon MRN: [id number] DOB: [birth year]    ADMISSION DATE:  09/09/2016 CONSULTATION DATE: 3/20  REFERRING MD : [name]   CHIEF COMPLAINT:  Pre-op pulmonary/critical care risk assessment   BRIEF PATIENT DESCRIPTION:  83 year old male who resides at a SNF. He has a sig medical h/o AF, prior CVA, severe torticollis, mild dementia, BPH w/ slow growing prostate cancer, Chronic diastolic HF, chronic aspiration, CKD stage I and non-healing left ankle ulcer. Presented to ER 3/18 w/ abd pain and vomiting. Found to have recurrent sigmoid volvulus. He was seen by GI medicine w/ unsuccessful decompression attempt & it was felt that it could not be flipped endoscopically. He was seen by surgery who feels that surgical management would require permanent colostomy. Given his multiple medical co-morbids PCCM was asked to see and comment on pulmonary risk.    SUBJECTIVE:  RT reports pt weaning on 12/5, no distress.  Potassium replaced overnight.   VITAL SIGNS: Temp:  [98.1 F (36.7 C)-99.4 F (37.4 C)] 98.1 F (36.7 C) (03/26 0800) Pulse Rate:  [65-91] 65 (03/26 0315) Resp:  [14-32] 23 (03/26 0800) BP: (79-134)/(48-108) 134/100 (03/26 0800) SpO2:  [97 %-100 %] 100 % (03/26 0831) FiO2 (%):  [30 %] 30 % (03/26 0831) Weight:  [153 lb 14.1 oz (69.8 kg)] 153 lb 14.1 oz (69.8 kg) (03/26 0700)   Intake/Output Summary (Last 24 hours) at 09/17/16 0851 Last data filed at 09/17/16 0801  Gross per 24 hour  Intake           797.75 ml  Output             5685 ml  Net         -4887.25 ml   General: frail, chronically ill appearing male in NAD on vent HEENT: MM pink/moist, ETT, severe torticollis  PSY: calm Neuro: Awake, nods appropriately, tracks CV: s1s2 rrr, no m/r/g PULM: even/non-labored, clear on R, diminished on L GY:IRSW, non-tender, bsx4 active  Extremities: warm/dry, no edema Skin: no rashes or lesions  CBC Recent Labs     09/15/16  0305  09/16/16  0319  09/17/16  0336  WBC  5.4  6.3  6.3  HGB  10.1*  10.1*  10.8*  HCT  30.5*  30.0*  32.5*  PLT  118*  121*  150    Coag's Recent Labs     09/16/16  0319  09/16/16  1148  09/17/16  0336  APTT  >200*  78*  69*    BMET Recent Labs     09/15/16  1604  09/16/16  0319  09/17/16  0336  NA  142  141  139  K  3.9  3.6  3.2*  CL  110  107  102  CO2  26  28  27   BUN  12  13  17   CREATININE  0.79  0.76  0.92  GLUCOSE  152*  110*  103*    Electrolytes Recent Labs     09/15/16  1604  09/16/16  0319  09/16/16  1610  09/17/16  0336  CALCIUM  8.1*  7.9*   --   8.4*  MG  2.3  2.1   --   2.0  PHOS   --   3.0  4.0  4.7*    Sepsis Markers Recent Labs     09/15/16  1042  09/16/16  0319  09/17/16  0336  PROCALCITON  0.12  0.12  0.13    ABG Recent Labs     09/15/16  0455  09/16/16  0405  09/17/16  0320  PHART  7.564*  7.587*  7.588*  PCO2ART  30.4*  29.9*  29.3*  PO2ART  118*  86.5  96.7    Liver Enzymes No results for input(s): AST, ALT, ALKPHOS, BILITOT, ALBUMIN in the last 72 hours.  Cardiac Enzymes No results for input(s): TROPONINI, PROBNP in the last 72 hours.  Glucose Recent Labs     09/16/16  0813  09/16/16  1241  09/16/16  2003  09/16/16  2348  09/17/16  0356  09/17/16  0813  GLUCAP  110*  114*  121*  106*  110*  98    Imaging Dg Chest Port 1 View  Result Date: 09/17/2016 CLINICAL DATA:  Acute respiratory failure. Atrial fibrillation, CHF, nonsmoker. EXAM: PORTABLE CHEST 1 VIEW COMPARISON:  Portable chest x-ray of September 16, 2016 FINDINGS: The lungs are mildly hypoinflated. The endotracheal tube tip lies 2.7 cm above the carina. The esophagogastric tube tip lies in the proximal gastric body. Bibasilar densities have improved somewhat. The cardiac silhouette remains enlarged. The pulmonary vascularity is not engorged. IMPRESSION: Mild hypoinflation. Improving bibasilar atelectasis or pneumonia. No pulmonary edema or significant  pleural effusion. The support tubes are in reasonable position. Electronically Signed   By: David  Martinique M.D.   On: 09/17/2016 07:03   Dg Chest Port 1 View  Result Date: 09/16/2016 CLINICAL DATA:  Patient with acute respiratory failure. History of CHF. EXAM: PORTABLE CHEST 1 VIEW COMPARISON:  Chest radiograph 09/15/2016. FINDINGS: ET tube terminates in the mid trachea. Enteric tube tip and side-port project in the stomach. Stable cardiomegaly. Soft tissues overlie the upper thorax, limiting evaluation. Persistent heterogeneous opacities mid lower lungs bilaterally. Small bilateral pleural effusions. IMPRESSION: ET tube terminates in the mid trachea. Persistent mid lower lung heterogeneous opacities. Considerations include pneumonia or atelectasis. Small bilateral pleural effusions. Electronically Signed   By: Lovey Newcomer M.D.   On: 09/16/2016 07:09   SIGNIFICANT EVENTS:  3/18  Admit with sigmoid volvulus > flexible sigmoidoscopy unsuccessful at decompression  3/22  To OR for sigmoid resection + colostomy, returned to ICU on vent    STUDIES:  3/18 CT ABD >> Recurrent distal sigmoid colon volvulus resulting in upstream colonic obstruction. 3/24 CXR >> Small left effusion with atelectasis. Cardiomegaly. ETT is ok position.   CULTURES:   ANTIBIOTICS:    ASSESSMENT / PLAN:  A:  Sigmoid volvulus s/p sigmoid colectomy w/ colostomy (3/22) P: TF per nutrition  GI / CCS following  Post-op recommendations per surgery WOC for colostomy care  A: Ventilator dependence - remained intubated post-op due to residual anesthetics, Grade I airway per anesthesia notes Chronic aspiration (CT abd showing limited field of chest was clear) Torticollis P: PRVC 8 cc/kg  Daily PSV / WUA  Diuresis as renal function / BP permit  Trend CXR   A: Chronic dysphagia GERD P: Will need speech eval once extubated Continue TF for now  PPI for stress ulcer prophylaxis   A: Hypernatremia  Hyperchloremic non  gap acidosis  Hypernatremia P: Trend BMP / urinary output Replace electrolytes as indicated Avoid nephrotoxic agents, ensure adequate renal perfusion Reduce lasix to 40 mg IV QD for today, then d/c  A: CAF w/ CVR P: ICU monitoring of hemodynamics Continue heparin gtt per pharmacy  Cardizem gtt  PRN lopressor  A: Hx prostate cancer; CRI stage I P:  Monitor urine output  Supportive care  A:  Mild chronic thrombocytopenia Anemia of chronic disease P: Trend CBC  Transfuse for Hgb <7   A: H/O CVA w/ Deconditioning Dementia  And very hard of hearing  P:  PT / OT post extubation  Minimize sedation as able    Family: Pending discussion with HCPOA am 3/26   GOC:  Full code for now.  Mr. Lucy Antig[facility]) states living will states DNR/DNI in the case of terminal illness.    NP CC Time: 30 minutes  Noe Gens, NP-C [facility] Pgr: [phone] or if no answer [phone] 09/17/2016, 8:51 AM

## 2016-09-17 NOTE — Progress Notes (Signed)
ANTICOAGULATION CONSULT NOTE - Follow Up Consult  Pharmacy Consult for heparin Indication: atrial fibrillation  No Known Allergies  Patient Measurements: Height: 5\' 6"  (167.6 cm) Weight: 153 lb 14.1 oz (69.8 kg) IBW/kg (Calculated) : 63.8  Heparin Dosing Weight: using total body weight  Vital Signs: Temp: 98.1 F (36.7 C) (03/26 0800) Temp Source: Oral (03/26 0800) BP: 134/100 (03/26 0800) Pulse Rate: 65 (03/26 0315)  Labs:  Recent Labs  09/15/16 0305 09/15/16 1604 09/16/16 0319 09/16/16 1148 09/17/16 0336  HGB 10.1*  --  10.1*  --  10.8*  HCT 30.5*  --  30.0*  --  32.5*  PLT 118*  --  121*  --  150  APTT 80*  --  >200* 78* 69*  HEPARINUNFRC 0.97*  --  1.94* 1.18* 1.00*  CREATININE 0.64 0.79 0.76  --  0.92   Estimated Creatinine Clearance: 49.1 mL/min (by C-G formula based on SCr of 0.92 mg/dL).  Assessment: 83yo M admitted with N/V and abdominal pain on apixaban for A.fib. Recurrent volvulus was unable to be resolved endoscopically. Pharmacy is asked to start heparin infusion while apixaban on hold.  First heparin level (anti-Xa level) high as expected given recent apixaban use.  Due to the effects of apixaban on heparin levels, using aPTT to guide therapy until levels correlate (effects of apixaban diminished).  Last dose of apixaban reported as 3/18 at 0900.    Significant events: 3/22 lap sigmoid colectomy with end-colostomy, repair of spigelian hernia. Heparin infusion resumed ~6 hours postop per surgery's instructions without bolus. 3/25 This morning's lab drawn inappropriately from same side heparin was infusing (aPTT > 200 sec, HL 1.94 units/ml).  Labs redrawn this afternoon following resumption of heparin = heparin level 1.18 and aPTT 78 seconds with heparin rate at 700 units/hr.  Today, 09/17/2016: -  APTT and Hep level decreasing as Apixaban effect subsides - aPTT 69 sec, HL 1.0 units/ml - Hgb and platelets both low but stable, Plt improved to 150 - No  bleeding documented.   Goal of Therapy:  aPTT 66-102 sec Heparin level 0.3-0.7 units/ml Monitor platelets by anticoagulation protocol: Yes   Plan:  Continue heparin infusion at 700 units/hr Check aPTT and heparin level tomorrow morning. Check CBC daily while on heparin. F/u for transition back to Eliquis once extubated.  Minda Ditto PharmD Pager [phone] 09/17/2016, 8:26 AM

## 2016-09-17 NOTE — Progress Notes (Signed)
[facility] Physician Progress Note and Electrolyte Replacement  Patient Name: Eduardo Deleon DOB: Mar 19, 1925 MRN: [id number]  Date of Service  09/17/2016   HPI/Events of Note    Recent Labs Lab 09/14/16 0325 09/15/16 0305 09/15/16 1604 09/16/16 0319 09/16/16 1610 09/17/16 0336  NA 147* 144 142 141  --  139  K 3.7 2.7* 3.9 3.6  --  3.2*  CL 121* 112* 110 107  --  102  CO2 18* 26 26 28   --  27  GLUCOSE 94 131* 152* 110*  --  103*  BUN 12 10 12 13   --  17  CREATININE 0.89 0.64 0.79 0.76  --  0.92  CALCIUM 8.5* 8.1* 8.1* 7.9*  --  8.4*  MG 1.8 1.7 2.3 2.1  --  2.0  PHOS 1.2* 1.1*  --  3.0 4.0 4.7*    Estimated Creatinine Clearance: 49.1 mL/min (by C-G formula based on SCr of 0.92 mg/dL).  Intake/Output      03/25 0701 - 03/26 0700   I.V. (mL/kg) 325.8 (4.7)   NG/GT 345   Total Intake(mL/kg) 670.8 (9.6)   Urine (mL/kg/hr) 5075 (3)   Other 260 (0.2)   Total Output 5335   Net -4664.3        - I/O DETAILED x 24h    Total I/O In: 296.8 [I.V.:131.8; NG/GT:165] Out: 2485 [Urine:2225; Other:260] - I/O THIS SHIFT    ASSESSMENT Mild low K  eICURN Interventions  Replete K via IV   ASSESSMENT: MAJOR ELECTROLYTE      Dr. Brand Males, M.D., [facility].C.P Pulmonary and Critical Care Medicine Staff Physician [facility] Pager: [phone], If no answer or between  15:00h - 7:00h: call [phone]  09/17/2016 6:39 AM

## 2016-09-17 NOTE — Progress Notes (Signed)
[facility] Progress Note Patient Name: Eduardo Deleon DOB: October 22, 1924 MRN: [id number]   Date of Service  09/17/2016  HPI/Events of Note  Mr. Skoda nurse contacted me because the patient noted that he did not want to be re-intubated.  I called Vicente Males, his HCPOA to discuss this further.  I stated that given his advanced age and recent surgery that in the event of acute respiratory failure mechanical ventilation would be unlikely to provide medical benefit.  eICU Interventions  Will write order for DNR.     Intervention Category Major Interventions: End of life / care limitation discussion  Simonne Maffucci 09/17/2016, 9:01 PM

## 2016-09-17 NOTE — Progress Notes (Signed)
Family Discussion Note: Lengthy discussion with the patient's healthcare power of attorney Mr. Lucy Antigua this morning. We discussed his lack of spontaneous respiration. We also discussed the poor prognosis in the event of cardiac arrest. He feels that the patient would not wish to undergo resuscitation in the event of cardiac arrest. We also discussed his desire for a relatively independent lifestyle and values. Particularly, the patient desired to return to eating which was the main reasoning behind his recent surgery and ostomy placement per the patient's healthcare power of attorney. We discussed the fact that the patient cannot remained endotracheally intubated indefinitely. The patient's healthcare power of attorney would like to be notified in any event of planned extubation. We did briefly discuss the potential to extubate to BiPAP and further palliate the patient's symptoms if  his respiratory support requirements began to escalate.  Sonia Baller Ashok Cordia, M.D. [facility] Pager:  [phone] After 3pm or if no response, call [phone] 11:57 AM 09/17/16

## 2016-09-17 NOTE — Progress Notes (Signed)
[facility] Progress Note Patient Name: Eduardo Deleon DOB: 10/06/1924 MRN: [id number]   Date of Service  09/17/2016  HPI/Events of Note  Self extubated Breathing comfortably O2 saturation 100% RA  eICU Interventions  Continue current management     Intervention Category Minor Interventions: Routine modifications to care plan (e.g. PRN medications for pain, fever)  Simonne Maffucci 09/17/2016, 5:59 PM

## 2016-09-17 NOTE — Progress Notes (Signed)
Progress Note  Patient Name: Eduardo Deleon Date of Encounter: 09/17/2016  Primary Cardiologist: Dr Claiborne Billings  Patient Profile     83 y.o. male w/ hx  SSS, permanent Afib, CHADS2VASC=6 (age x 2, CVA x 2, PAD, CHF) GERD, EF 50-55% echo 03/2016, prostate Ca, sigmoid volvulus, PVD, CVA, dementia, and torticollis who presented to [facility] ED on 09/09/2016 with abd pain 2/2 recent sigmoid volvulus. Cards consulted for Afib with RVR and surgical clearance. Underwent sigmoid colectomy with colostomy on 09/13/2016.   Subjective   Awake on the vent, not clearly responsive to commands or questions  Inpatient Medications    Scheduled Meds: . chlorhexidine gluconate (MEDLINE KIT)  15 mL Mouth Rinse BID  . feeding supplement (VITAL HIGH PROTEIN)  1,000 mL Per Tube Q24H  . furosemide  40 mg Intravenous Q12H  . mouth rinse  15 mL Mouth Rinse QID  . pantoprazole (PROTONIX) IV  40 mg Intravenous Q12H  . potassium chloride  10 mEq Intravenous Q1 Hr x 3  . sodium chloride flush  3 mL Intravenous Q12H   Continuous Infusions: . diltiazem (CARDIZEM) infusion 5 mg/hr (09/17/16 0900)  . heparin 700 Units/hr (09/17/16 0900)   PRN Meds: fentaNYL (SUBLIMAZE) injection, fentaNYL (SUBLIMAZE) injection, metoprolol, midazolam, midazolam, ondansetron (ZOFRAN) IV   Vital Signs    Vitals:   09/17/16 0600 09/17/16 0700 09/17/16 0800 09/17/16 0831  BP: 103/65 97/74 (!) 134/100   Pulse:      Resp: 18 (!) 21 (!) 23   Temp:   98.1 F (36.7 C)   TempSrc:   Oral   SpO2: 100% 100% 98% 100%  Weight:  153 lb 14.1 oz (69.8 kg)    Height:        Intake/Output Summary (Last 24 hours) at 09/17/16 7026 Last data filed at 09/17/16 0900  Gross per 24 hour  Intake           782.75 ml  Output             5685 ml  Net         -4902.25 ml   Filed Weights   09/15/16 0400 09/16/16 0402 09/17/16 0700  Weight: 156 lb 8.4 oz (71 kg) 153 lb 14.1 oz (69.8 kg) 153 lb 14.1 oz (69.8 kg)    Telemetry    Atrial fib, rate  generally ok - Personally Reviewed  ECG    n/a - Personally Reviewed  Physical Exam   General: Well developed, well nourished, male appearing in no acute distress. Head: Normocephalic, atraumatic.  Neck: Supple without bruits, JVD not seen elevated, difficult to assess 2nd pt habitus and equipment. Lungs:  Resp regular and unlabored, decreased BS bases w/ some rales. Heart: Irreg R&R, S1, S2, no S3, S4, or murmur; no rub. Abdomen: Soft, non-tender, non-distended with normoactive bowel sounds. No hepatomegaly. No rebound/guarding. No obvious abdominal masses. Extremities: No clubbing, cyanosis, no edema. Distal pedal pulses are 2+ bilaterally. Neuro: Alert and oriented X 1. Moves all extremities spontaneously.  Labs    Hematology  Recent Labs Lab 09/15/16 0305 09/16/16 0319 09/17/16 0336  WBC 5.4 6.3 6.3  RBC 3.45* 3.41* 3.72*  HGB 10.1* 10.1* 10.8*  HCT 30.5* 30.0* 32.5*  MCV 88.4 88.0 87.4  MCH 29.3 29.6 29.0  MCHC 33.1 33.7 33.2  RDW 18.0* 18.3* 17.8*  PLT 118* 121* 150    Chemistry Recent Labs Lab 09/11/16 0403  09/13/16 0533  09/15/16 1604 09/16/16 0319 09/17/16 0336  NA 143  < >  148*  < > 142 141 139  K 3.1*  < > 4.1  < > 3.9 3.6 3.2*  CL 112*  < > 119*  < > 110 107 102  CO2 23  < > 16*  < > 26 28 27   GLUCOSE 74  < > 94  < > 152* 110* 103*  BUN 19  < > 14  < > 12 13 17   CREATININE 0.81  < > 0.86  < > 0.79 0.76 0.92  CALCIUM 8.2*  < > 8.6*  < > 8.1* 7.9* 8.4*  PROT 5.8*  --  6.2*  --   --   --   --   ALBUMIN 3.2*  --  3.4*  --   --   --   --   AST 23  --  44*  --   --   --   --   ALT 12*  --  22  --   --   --   --   ALKPHOS 69  --  66  --   --   --   --   BILITOT 1.4*  --  1.7*  --   --   --   --   GFRNONAA >60  < > >60  < > >60 >60 >60  GFRAA >60  < > >60  < > >60 >60 >60  ANIONGAP 8  < > 13  < > 6 6 10   < > = values in this interval not displayed.    Radiology    Dg Chest Port 1 View Result Date: 09/17/2016 CLINICAL DATA:  Acute respiratory  failure. Atrial fibrillation, CHF, nonsmoker. EXAM: PORTABLE CHEST 1 VIEW COMPARISON:  Portable chest x-ray of September 16, 2016 FINDINGS: The lungs are mildly hypoinflated. The endotracheal tube tip lies 2.7 cm above the carina. The esophagogastric tube tip lies in the proximal gastric body. Bibasilar densities have improved somewhat. The cardiac silhouette remains enlarged. The pulmonary vascularity is not engorged. IMPRESSION: Mild hypoinflation. Improving bibasilar atelectasis or pneumonia. No pulmonary edema or significant pleural effusion. The support tubes are in reasonable position. Electronically Signed   By: Phelicia Dantes  Martinique M.D.   On: 09/17/2016 07:03   Dg Chest Port 1 View Result Date: 09/16/2016 CLINICAL DATA:  Patient with acute respiratory failure. History of CHF. EXAM: PORTABLE CHEST 1 VIEW COMPARISON:  Chest radiograph 09/15/2016. FINDINGS: ET tube terminates in the mid trachea. Enteric tube tip and side-port project in the stomach. Stable cardiomegaly. Soft tissues overlie the upper thorax, limiting evaluation. Persistent heterogeneous opacities mid lower lungs bilaterally. Small bilateral pleural effusions. IMPRESSION: ET tube terminates in the mid trachea. Persistent mid lower lung heterogeneous opacities. Considerations include pneumonia or atelectasis. Small bilateral pleural effusions. Electronically Signed   By: Lovey Newcomer M.D.   On: 09/16/2016 07:09   Cardiac Studies   None  Patient Profile     83 y.o. male w/ hx SSS, permanent Afib, CHADS2VASC=6 (age x 2, CVA x 2, PAD, CHF) GERD, EF 50-55% echo 03/2016, prostate Ca, sigmoid volvulus, PVD, CVA, dementia, and torticollis who presented to [facility] ED on 09/09/2016 with abd pain 2/2 recent sigmoid volvulus. Cards consulted for Afib with RVR and surgical clearance. Underwent sigmoid colectomy with colostomy on 09/13/2016.  Assessment & Plan     Active Problems:   Volvulus (HCC)   Abdominal pain   Abnormal CT of the abdomen   Preoperative  cardiovascular examination   SBO (small bowel obstruction)   S/P  colon resection  1. s/p colon resection: pt still intubated, Mgt per CCM, CCS - weaning   2. Atrial fib, RVR - now CVR - rate improved on low-dose IV Cardizem, continue to follow - on Eliquis PTA, held and now on heparin.  Otherwise, per CCM, K+ being supplemented   Signed, Rosaria Ferries , PA-C 9:07 AM 09/17/2016 Pager: [phone]  I have seen, examined and evaluated the patient this PM along with Rosaria Ferries, PA-C.  After reviewing all the available data and chart, we discussed the patients laboratory, study & physical findings as well as symptoms in detail. I agree with her findings, examination as well as impression recommendations as per our discussion.    Afib rate controlled on Dilt gtt with stable BP - plan to convert to PO when ready from a post-op standpoint.  PRN BB for RVR - on Heparin until taking PO - then restart DOAC.  No active cardiac issues.   Glenetta Hew, M.D., M.S. Interventional Cardiologist   Pager # [phone] Phone # [phone] [address] [place], [place] [address]

## 2016-09-17 NOTE — Progress Notes (Addendum)
Nutrition Follow-up  DOCUMENTATION CODES:   Not applicable  INTERVENTION:  - Will change TF regimen: 30 ml Prostat once/day with Osmolite 1.2 @ 20 mL/hr to increase by 10 mL every 8 hours to reach goal rate of Osmolite 1.2 @ 50 mL/hr. At goal rate this regimen will provide 1540 kcal (96% estimated kcal need), 82 grams of protein (98% minimum estimated protein need), and 984 mL free water.  - Will order 100 mL free water QID.   NUTRITION DIAGNOSIS:   Inadequate oral intake related to inability to eat as evidenced by NPO status. -ongoing  GOAL:   Patient will meet greater than or equal to 90% of their needs -unmet with current TF regimen.   MONITOR:   Vent status, TF tolerance, Weight trends, Labs, Skin  ASSESSMENT:   83 y.o. male presenting via EMS from [facility] with 1 week of sudden onset suprapubic pain which starts just below the navel and radiates down but doesn't involve his genitalia. He has had a small soft bowel movement yesterday. He also reports a productive cough for the past two weeks  3/26 Pt with OGT and receiving Vital High Protein @ 15 mL/hr. Reviewed all CCM and Surgery notes since previous RD note. God son, who is HCPOA, arrived on the unit a short time ago and team to discuss POC with him. Pt was originally made DNR with planned one-way extubation 3/25. Decision was later rescinded and plan to re-discuss today.   Talked with RN and PCCM NP earlier this AM. Pt has not been able to successfully wean today. NP states deferment to Surgery concerning TF; page to Surgery PA and awaiting call back.  Weight trending 68.2-71 kg since 3/20; used CBW of 69.8 kg to re-estimate needs this AM.   Patient is currently intubated on ventilator support MV: 10.5 L/min Temp (24hrs), Avg:98.5 F (36.9 C), Min:98.1 F (36.7 C), Max:99.4 F (37.4 C) BP: 99/59 and MAP: 65  Medications reviewed; 40 mg IV Lasix/day, 40 mg IV Protonix BID, 40 mEq KCl per OGT x1 dose  today, 40 mEq IV KPhos x1 run yesterday.  Labs reviewed; CBGs: 98 and 110 mg/dL this AM, K: 3.2 mmo/L, Phos: 4.7 mg/dL, Ca: 8.4 mg/dL.  Drip: Heparin @ 700 units/hr.   3/23 - POD #1 lap colon resection of sigmoid with colonoscopy and hernial repair.  - Pt intubated with OGT in since time of procedure yesterday.  - Estimated nutrition needs updated based on this event.  - No family/visitors present this AM.  - Consult for TF initiation of trophic/trickle feeds only.  - Per notes, pt with chronic dysphagia and will be on aspiration precautions following extubation.  - Pt with stage 1 prostate cancer.   Patient is currently intubated on ventilator support MV: 10.7 L/min Temp (24hrs), Avg:98.4 F (36.9 C), Min:96.8 F (36 C), Max:99.5 F (37.5 C) Propofol: none Phos: 1.2 mg/dL. IVF: D5-100 mEq sodium bicarb @ 75 mL/hr (306 kcal from dextrose).   3/19 - Pt has been NPO since admission.  - Attempted flex sig yesterday but GI NP note at 1239 today states that this procedure was unsuccessful.  - Per GI NP note:Previously deemed a poor surgical candidate by Surgery but would ask for reevaluation for consideration of adiverting colostomy.  - No family/visitors at bedside.  - When introduced self to pt he stated a sequence of several numbers but was unable to state what any of these numbers meant.  - Asked pt about any  abdominal pain or nausea but he did not respond to this.  - Per notes, pt had denied these symptoms earlier today.  - Pt seemed to be very HOH during visit although this is not documented in other notes.  - Per MD note, pt with chronic aspiration.  - Pt missing many teeth.  - Physical assessment limited to upper body only at this time and shows some mild muscle wasting around shoulder and clavicle areas; may be related to anatomy d/t torticollis.  - Per chart review, weight has been stable since October 2017.  IVF:NS @ 75 mL/hr.     Diet Order:  Diet NPO time  specified  Skin:  Wound (see comment) (Stage 2 nonhealing L ankle pressure injury)  Last BM:  3/23  Height:   Ht Readings from Last 1 Encounters:  09/15/16 5\' 6"  (1.676 m)    Weight:   Wt Readings from Last 1 Encounters:  09/17/16 153 lb 14.1 oz (69.8 kg)    Ideal Body Weight:  64.54 kg  BMI:  Body mass index is 24.84 kg/m.  Estimated Nutritional Needs:   Kcal:  1603  Protein:  84-105 grams (1.2-1.5 grams/kg)  Fluid:  >/= 1.7 L/day  EDUCATION NEEDS:   No education needs identified at this time    Jarome Matin, MS, RD, LDN, CNSC Inpatient Clinical Dietitian Pager # [phone] After hours/weekend pager # [phone]

## 2016-09-17 NOTE — Progress Notes (Addendum)
[facility] Attending Note  Presenting HPI:  83 y.o. male who resides at a SNF. He has a sig medical h/o AF, prior CVA, severe torticollis, mild dementia, BPH w/ slow growing prostate cancer, Chronic diastolic HF, chronic aspiration, CKD stage I and non-healing left ankle ulcer. Presented to ER 3/18 w/ abd pain and vomiting. Found to have recurrent sigmoid volvulus. He was seen by GI medicine w/ unsuccessful decompression attempt & it was felt that it could not be flipped endoscopically. He was seen by surgery who feels that surgical management would require permanent colostomy. Given his multiple medical co-morbids PCCM was asked to see and comment on pulmonary risk.   Subjective:  No acute events overnight. Patient's potassium was replaced. Patient had no respiratory effort during his ventilator weaning this morning and was switched back over to full support.   Review of Systems:  Unable to obtain given intubation.   Vent Mode: CPAP;PSV FiO2 (%):  [30 %] 30 % Set Rate:  [18 bmp] 18 bmp Vt Set:  [510 mL] 510 mL PEEP:  [5 cmH20] 5 cmH20 Pressure Support:  [12 cmH20] 12 cmH20 Plateau Pressure:  [20 cmH20-22 cmH20] 22 cmH20  Temp:  [98.1 F (36.7 C)-99.4 F (37.4 C)] 98.1 F (36.7 C) (03/26 0800) Pulse Rate:  [65-91] 65 (03/26 0315) Resp:  [17-32] 23 (03/26 0800) BP: (79-134)/(48-108) 134/100 (03/26 0800) SpO2:  [98 %-100 %] 100 % (03/26 0831) FiO2 (%):  [30 %] 30 % (03/26 0831) Weight:  [153 lb 14.1 oz (69.8 kg)] 153 lb 14.1 oz (69.8 kg) (03/26 0700)  General:  No family at bedside. Intubated. No distress. Integument:  Warm & dry. No rash on exposed skin. HEENT:  Moist mucus memebranes. No scleral icterus. Endotracheal tube in place. Severe torticollis. Neurological:  Pupils symmetric. Nods to questions & attends to voice. Musculoskeletal:  No joint effusion or erythema appreciated.  Pulmonary:  Symmetric chest wall rise on ventilator. Coarse breath sounds  bilaterally. Cardiovascular:  Regular rate. No appreciable JVD. Normal S1 & S2. Abdomen:  Soft. Nondistended. Normoactive bowel sounds. Ostomy is pink.   LINES/TUBES: OETT 7.0 3/22 >>> Foley 3/22 >>> OGT 3/22 >>> PIV  CBC Latest Ref Rng & Units 09/17/2016 09/16/2016 09/15/2016  WBC 4.0 - 10.5 K/uL 6.3 6.3 5.4  Hemoglobin 13.0 - 17.0 g/dL 10.8(L) 10.1(L) 10.1(L)  Hematocrit 39.0 - 52.0 % 32.5(L) 30.0(L) 30.5(L)  Platelets 150 - 400 K/uL 150 121(L) 118(L)    BMP Latest Ref Rng & Units 09/17/2016 09/16/2016 09/15/2016  Glucose 65 - 99 mg/dL 103(H) 110(H) 152(H)  BUN 6 - 20 mg/dL _0 Creatinine 0.61 - 1.24 mg/dL 0.92 0.76 0.79  Sodium 135 - 145 mmol/L 139 141 142  Potassium 3.5 - 5.1 mmol/L 3.2(L) 3.6 3.9  Chloride 101 - 111 mmol/L 102 107 110  CO2 22 - 32 mmol/L _1 Calcium 8.9 - 10.3 mg/dL 8.4(L) 7.9(L) 8.1(L)   Hepatic Function Latest Ref Rng & Units 09/13/2016 09/11/2016 09/10/2016  Total Protein 6.5 - 8.1 g/dL 6.2(L) 5.8(L) 5.9(L)  Albumin 3.5 - 5.0 g/dL 3.4(L) 3.2(L) 3.3(L)  AST 15 - 41 U/L 44(H) 23 16  ALT 17 - 63 U/L 22 12(L) 10(L)  Alk Phosphatase 38 - 126 U/L 66 69 61  Total Bilirubin 0.3 - 1.2 mg/dL 1.7(H) 1.4(H) 1.1    IMAGING/STUDIES: CT ABD/PELVIS 3/18:  Recurrent sigmoid colono volvulus resulting in upstream colonic obstruction. PORT CXR 3/26:  Personally reviewed by me. Endotracheal  tube in acceptable position. Improving left lower lobe opacity with decreased silhouetting of left hemidiaphragm. No new right lung opacity.  MICROBIOLOGY: MRSA PCR 2/19:  Positive  Urine Culture 2/21:  Proteus mirabilis  ANTIBIOTICS: Cefotan 3/22 (peri-op prophylaxis)  SIGNIFICANT EVENTS: 03/18 - Admit 03/21 - Started on Diltiazem drip 03/22 - Sigmoid resection & colostomy. Returned to ICU on ventilator. Started on Heparin drip post-op.   ASSESSMENT/PLAN:  83 y.o. male with sigmoid volvulus status post resection and subsequent colostomy on 3/22. Patient's perioperative  course complicated by atrial fibrillation with rapid ventricular response. Patient's imaging concerning for chronic aspiration likely due to his torticollis.  1. Sigmoid volvulus: Status post sigmoid colectomy with colostomy on 3/22. Management per surgery service. 2. Acute hypoxic respiratory failure: Continuing daily pressure support wean & spontaneous breathing trial. Volume status currently optimized. Discontinuing further diuretics after today. 3. Atrial fibrillation with rapid ventricular response: Continuous telemetry monitoring. Appreciate recommendations from cardiology. Patient continuing on systemic and coagulation with heparin drip & diltiazem drip for rate control. 4. Hypokalemia: Status post KCl 30 mEq IV this morning. Administering additional 40 mEq by his tube. Repeat electrolytes in the morning. 5. Anemia: Mild & hemoglobin stable. No signs of active bleeding. Trending cell counts daily with CBC while on systemic anticoagulation with heparin drip. 6. Hypernatremia/hyperchloremia: Resolved. 7. Chronic dysphagia: Plan for speech therapy evaluation and aspiration precautions postextubation. 8. History of prostate cancer: No treatment at this time. 9. History of CVA with deconditioning/dementia/significant hearing loss: Minimizing sedating medications. Plan for PT/OT consultation postextubation. Desired RASS 0.  10. Chronic diastolic congestive heart failure: Currently euvolemic. Holding on further diuresis at this time.  Prophylaxis:  Protonix IV q12hr & therapeutic anticoagulation with heparin drip per pharmacy protocol.  Diet:  NPO. Continuing tube feedings.  Code Status:  Full Code per previous physician discussions.  Disposition:  Patient remains in the ICU on mechanical ventilation.  Family Update:  No family at bedside during rounds this morning.   I have personally spent a total of 32 minutes of critical care time today caring for the patient & reviewing the aptient's  electronic medical record.  Sonia Baller Ashok Cordia, M.D. [facility] Pager:  [phone] After 3pm or if no response, call [phone] 9:26 AM 09/17/16

## 2016-09-17 NOTE — Progress Notes (Signed)
4 Days Post-Op  Subjective: He is very awake this AM, hands are in mittens.  Still on Vent with TF at very low rate.  Motioning with his hands around the endotracheal tube.   Ostomy is still pretty edematous, and pink.  He has gas and black colored fluid coming from the ostomy.  Objective: Vital signs in last 24 hours: Temp:  [98.1 F (36.7 C)-99.4 F (37.4 C)] 98.7 F (37.1 C) (03/26 0430) Pulse Rate:  [65-91] 65 (03/26 0315) Resp:  [14-32] 18 (03/26 0600) BP: (79-134)/(48-108) 103/65 (03/26 0600) SpO2:  [97 %-100 %] 100 % (03/26 0600) FiO2 (%):  [30 %] 30 % (03/26 0430) Last BM Date: 09/15/16 325 IV 345 TF Urine 5075 260 ostomy Afebrile, SBP 90-120 range, remains in AF rate controlled On Vent K+ 3.2/mag 2.0 CBC is normal    Intake/Output from previous day: 03/25 0701 - 03/26 0700 In: 670.8 [I.V.:325.8; NG/GT:345] Out: 5335 [Urine:5075] Intake/Output this shift: No intake/output data recorded.  General appearance: alert and still on Vent.  and OG for tube feeds.  Very awake this AM.  he is very deaf, so it's hard to know how much of what you tell him he is understanding.   GI: soft, few very hypoactive BS, ostomy is pink and healthy, still very large and edematous.  He has gas and dark black colored fluid comming from it.  Lab Results:   Recent Labs  09/16/16 0319 09/17/16 0336  WBC 6.3 6.3  HGB 10.1* 10.8*  HCT 30.0* 32.5*  PLT 121* 150    BMET  Recent Labs  09/16/16 0319 09/17/16 0336  NA 141 139  K 3.6 3.2*  CL 107 102  CO2 28 27  GLUCOSE 110* 103*  BUN 13 17  CREATININE 0.76 0.92  CALCIUM 7.9* 8.4*   PT/INR No results for input(s): LABPROT, INR in the last 72 hours.   Recent Labs Lab 09/11/16 0403 09/13/16 0533  AST 23 44*  ALT 12* 22  ALKPHOS 69 66  BILITOT 1.4* 1.7*  PROT 5.8* 6.2*  ALBUMIN 3.2* 3.4*     Lipase     Component Value Date/Time   LIPASE 20 09/09/2016 1232     Studies/Results: Dg Chest Port 1 View  Result  Date: 09/17/2016 CLINICAL DATA:  Acute respiratory failure. Atrial fibrillation, CHF, nonsmoker. EXAM: PORTABLE CHEST 1 VIEW COMPARISON:  Portable chest x-ray of September 16, 2016 FINDINGS: The lungs are mildly hypoinflated. The endotracheal tube tip lies 2.7 cm above the carina. The esophagogastric tube tip lies in the proximal gastric body. Bibasilar densities have improved somewhat. The cardiac silhouette remains enlarged. The pulmonary vascularity is not engorged. IMPRESSION: Mild hypoinflation. Improving bibasilar atelectasis or pneumonia. No pulmonary edema or significant pleural effusion. The support tubes are in reasonable position. Electronically Signed   By: David  Martinique M.D.   On: 09/17/2016 07:03   Dg Chest Port 1 View  Result Date: 09/16/2016 CLINICAL DATA:  Patient with acute respiratory failure. History of CHF. EXAM: PORTABLE CHEST 1 VIEW COMPARISON:  Chest radiograph 09/15/2016. FINDINGS: ET tube terminates in the mid trachea. Enteric tube tip and side-port project in the stomach. Stable cardiomegaly. Soft tissues overlie the upper thorax, limiting evaluation. Persistent heterogeneous opacities mid lower lungs bilaterally. Small bilateral pleural effusions. IMPRESSION: ET tube terminates in the mid trachea. Persistent mid lower lung heterogeneous opacities. Considerations include pneumonia or atelectasis. Small bilateral pleural effusions. Electronically Signed   By: Lovey Newcomer M.D.   On: 09/16/2016 07:09  Medications: . chlorhexidine gluconate (MEDLINE KIT)  15 mL Mouth Rinse BID  . feeding supplement (VITAL HIGH PROTEIN)  1,000 mL Per Tube Q24H  . furosemide  40 mg Intravenous Q12H  . mouth rinse  15 mL Mouth Rinse QID  . pantoprazole (PROTONIX) IV  40 mg Intravenous Q12H  . potassium chloride  10 mEq Intravenous Q1 Hr x 3  . sodium chloride flush  3 mL Intravenous Q12H   . diltiazem (CARDIZEM) infusion 5 mg/hr (09/17/16 0600)  . heparin 700 Units/hr (09/17/16 0600)     Assessment/Plan Recurrent sigmoid volvulus,  Right lower quadrant spigelian hernia s/p Laparoscopic-assisted sigmoid colectomy with end-colostomy, Hartmann's procedure; Laparoscopic repair of spigelian hernia primarily, 09/13/16, Dr. Greer Pickerel  POD4 Post op ventilator dependence Hypokalemia - CCM following Severe torticollis Severe hearing deficit Hx of CVA History of congestive heart failure History of chronic renal insufficiency - creatinine is stable and remains normal History of prostate cancer - slow growing Osteoarthritis GERD DNR FEN:IV fluids/Intubated ID:  Cefotetan pre op 09/13/16 DVT:  Heparin drip    Plan:   Await extubation, I think he's doing well from the surgery.  He will be slow to recover.  I will check a prealbumin in the AM, he is on trophic feeds.  I would think once he is extubated he may need a swallow study before we try PO's.  Appreciate CCM's care.    LOS: 8 days    Valin Massie 09/17/2016 [phone]

## 2016-09-18 ENCOUNTER — Inpatient Hospital Stay (HOSPITAL_COMMUNITY): Payer: Medicare Other

## 2016-09-18 LAB — RENAL FUNCTION PANEL
ANION GAP: 7 (ref 5–15)
Albumin: 2.9 g/dL — ABNORMAL LOW (ref 3.5–5.0)
BUN: 22 mg/dL — ABNORMAL HIGH (ref 6–20)
CO2: 26 mmol/L (ref 22–32)
Calcium: 8.5 mg/dL — ABNORMAL LOW (ref 8.9–10.3)
Chloride: 103 mmol/L (ref 101–111)
Creatinine, Ser: 0.93 mg/dL (ref 0.61–1.24)
GFR calc non Af Amer: >60 mL/min (ref >60)
GLUCOSE: 97 mg/dL (ref 65–99)
PHOSPHORUS: 4.7 mg/dL — AB (ref 2.5–4.6)
POTASSIUM: 3.3 mmol/L — AB (ref 3.5–5.1)
Sodium: 136 mmol/L (ref 135–145)

## 2016-09-18 LAB — CBC WITH DIFFERENTIAL/PLATELET
Basophils Absolute: 0 10*3/uL (ref 0.0–0.1)
Basophils Relative: 0 %
Eosinophils Absolute: 0.2 10*3/uL (ref 0.0–0.7)
Eosinophils Relative: 5 %
HEMATOCRIT: 33.9 % — AB (ref 39.0–52.0)
HEMOGLOBIN: 11.3 g/dL — AB (ref 13.0–17.0)
LYMPHS ABS: 1.5 10*3/uL (ref 0.7–4.0)
LYMPHS PCT: 29 %
MCH: 29.5 pg (ref 26.0–34.0)
MCHC: 33.3 g/dL (ref 30.0–36.0)
MCV: 88.5 fL (ref 78.0–100.0)
MONO ABS: 0.3 10*3/uL (ref 0.1–1.0)
MONOS PCT: 7 %
NEUTROS ABS: 2.9 10*3/uL (ref 1.7–7.7)
Neutrophils Relative %: 59 %
Platelets: 158 10*3/uL (ref 150–400)
RBC: 3.83 MIL/uL — ABNORMAL LOW (ref 4.22–5.81)
RDW: 17.8 % — AB (ref 11.5–15.5)
WBC: 5 10*3/uL (ref 4.0–10.5)

## 2016-09-18 LAB — MAGNESIUM: Magnesium: 2 mg/dL (ref 1.7–2.4)

## 2016-09-18 LAB — GLUCOSE, CAPILLARY
GLUCOSE-CAPILLARY: 99 mg/dL (ref 65–99)
GLUCOSE-CAPILLARY: 87 mg/dL (ref 65–99)
GLUCOSE-CAPILLARY: 89 mg/dL (ref 65–99)
Glucose-Capillary: 97 mg/dL (ref 65–99)
Glucose-Capillary: 110 mg/dL — ABNORMAL HIGH (ref 65–99)
Glucose-Capillary: 87 mg/dL (ref 65–99)

## 2016-09-18 LAB — MRSA PCR SCREENING: MRSA by PCR: NEGATIVE

## 2016-09-18 LAB — APTT: aPTT: 74 seconds — ABNORMAL HIGH (ref 24–36)

## 2016-09-18 LAB — HEPARIN LEVEL (UNFRACTIONATED): HEPARIN UNFRACTIONATED: 1.05 [IU]/mL — AB (ref 0.30–0.70)

## 2016-09-18 MED ORDER — POTASSIUM CHLORIDE 10 MEQ/100ML IV SOLN
10.0000 meq | INTRAVENOUS | Status: AC
  Administered 2016-09-18 (×3): 10 meq via INTRAVENOUS
  Filled 2016-09-18 (×3): qty 100

## 2016-09-18 MED ORDER — CHLORHEXIDINE GLUCONATE 0.12 % MT SOLN
15.0000 mL | Freq: Two times a day (BID) | OROMUCOSAL | Status: DC
  Administered 2016-09-19 – 2016-09-21 (×4): 15 mL via OROMUCOSAL
  Filled 2016-09-18 (×4): qty 15

## 2016-09-18 MED ORDER — SODIUM CHLORIDE 0.9 % IV SOLN: 30.0000 meq | Freq: Once | INTRAVENOUS | Status: DC

## 2016-09-18 MED ORDER — DILTIAZEM HCL 100 MG IV SOLR
5.0000 mg/h | INTRAVENOUS | Status: DC
  Administered 2016-09-18: 5 mg/h via INTRAVENOUS
  Filled 2016-09-18: qty 100

## 2016-09-18 MED ORDER — ORAL CARE MOUTH RINSE
15.0000 mL | Freq: Two times a day (BID) | OROMUCOSAL | Status: DC
  Administered 2016-09-20 – 2016-09-21 (×2): 15 mL via OROMUCOSAL

## 2016-09-18 NOTE — Evaluation (Signed)
Clinical/Bedside Swallow Evaluation Patient Details  Name: Eduardo Deleon MRN: [id number] Date of Birth: 1925/06/17  Today's Date: 09/18/2016 Time: SLP Start Time (ACUTE ONLY): 1443 SLP Stop Time (ACUTE ONLY): 1615 SLP Time Calculation (min) (ACUTE ONLY): 45 min  Past Medical History:  Past Medical History:  Diagnosis Date  . Anal fissure    Hx of  . Anemia   . Atrial fibrillation (HCC)    chronic anticaog - weintraub  . Congestive heart failure ([place])   . CRI (chronic renal insufficiency)   . Diverticulosis 04/12/1998   Left colon--Flex Sig  . GERD (gastroesophageal reflux disease)   . Internal hemorrhoids   . Osteoarthritis   . Prostate cancer ([place])   . PVD (peripheral vascular disease) ([place])   . Sigmoid volvulus ([place])    recurrent - 10/17, 2/18  . Splenic lesion   . Stroke [facility]) 1978 lower brain stem  . Torticollis, acquired   . Tubular adenoma   . Venous stasis dermatitis    hx venous ulcer/cellulitis 06/2011 R and 04/2011 L   Past Surgical History:  Past Surgical History:  Procedure Laterality Date  . COLON RESECTION SIGMOID N/A 09/13/2016   Procedure: LAPAROSCOPIC ASSISTED COLON RESECTION SIGMOID WITH COLOSTOMY;  Surgeon: Greer Pickerel, MD;  Location: [facility];  Service: General;  Laterality: N/A;  . [place]  . DIRECT LARYNGOSCOPY N/A 03/31/2014   Procedure: DIRECT LARYNGOSCOPY/EXAM UNDER ANESTHESIA;  Surgeon: [name], MD;  Location: [facility];  Service: ENT;  Laterality: N/A;  . FLEXIBLE SIGMOIDOSCOPY N/A 04/10/2016   Procedure: FLEXIBLE SIGMOIDOSCOPY;  Surgeon: Jerene Bears, MD;  Location: [facility] ENDOSCOPY;  Service: Endoscopy;  Laterality: N/A;  . FLEXIBLE SIGMOIDOSCOPY N/A 08/13/2016   Procedure: FLEXIBLE SIGMOIDOSCOPY;  Surgeon: Doran Stabler, MD;  Location: [facility] ENDOSCOPY;  Service: Gastroenterology;  Laterality: N/A;  . FLEXIBLE SIGMOIDOSCOPY N/A 09/09/2016   Procedure: FLEXIBLE SIGMOIDOSCOPY;  Surgeon: Carol Ada, MD;  Location: [facility] ENDOSCOPY;   Service: Endoscopy;  Laterality: N/A;  . HEMORRHOID SURGERY    . HERNIA REPAIR     left-1982, right - 1985  . JOINT REPLACEMENT  bilaterial knees  . LUMBAR LAMINECTOMY  Per patient  heriated disc in mid spine  . SPIGELIAN HERNIA  09/13/2016   Procedure: SPIGELIAN HERNIA;  Surgeon: Greer Pickerel, MD;  Location: [facility];  Service: General;;  . TONSILLECTOMY     HPI:  pt is an 83 yo male adm to [facility] with recurrent sigmoid volvulus s/p Laparoscopic-assisted sigmoid colectomy with end-colostomy.  Pt with h/o cervical spondylosis= torticollis, GERD, muscle weakness, cardiomegaly, ? Lower brainstem stroke.  He resides at [facility]. Pt was placed on vent from 3/22-3/26/2018.  He self extubated and pulled his NG yesterday.  Swallow evaluation ordered.  Pt has severe hearing loss and has some ongoing confusion per surgery note.     Assessment / Plan / Recommendation Clinical Impression  Pt with episodic increased work of breathing during session = RR up to 25.  He demonstrates signs of possible pharyngeal deficits c/b throat clearing, multiple swallows and cough after 80% of boluses.  Pt with baseline cough - but this largely ceased after pt stopped po during trials.  Pt admits to premorbid "getting choked easily" and states this "got better".  Can not rule out aspiration given pt's intubation x5 days- self extubation, signs of deficits and h/o dysphagia.  Pt required multiple written cues to understand he was in a hospital and he had surgery -  he kept repeating need to "find a room".    Recommend NPO except single ice chips and medication with puree.  Will follow up next date.  Using written cues advised pt to need to strengthen his cough and "hock" to clear secretions.  Pt also with intermittent belching.    Unfortunately given pt's torticollis/cervical spondylosis - MBS view may be limited however given h/o lower brainstem stroke (1978), it would be beneficial to attempt.  Otherwise clinical evaluation  may be only means of testing oropharyngeal swallow.  RN made aware of evaluation and recommendation.  Thanks.      SLP Visit Diagnosis: Dysphagia, pharyngeal phase (R13.13)    Aspiration Risk  Moderate aspiration risk    Diet Recommendation NPO except meds;Ice chips PRN after oral care   Medication Administration: Crushed with puree Supervision: Full supervision/cueing for compensatory strategies;Staff to assist with self feeding Postural Changes: Remain upright for at least 30 minutes after po intake    Other  Recommendations Oral Care Recommendations: Oral care QID   Follow up Recommendations        Frequency and Duration min 2x/week  2 weeks       Prognosis Prognosis for Safe Diet Advancement: Fair Barriers to Reach Goals: Other (Comment) (premorbid deficits)      Swallow Study   General Date of Onset: 09/18/16 HPI: pt is an 83 yo male adm to [facility] with recurrent sigmoid volvulus s/p Laparoscopic-assisted sigmoid colectomy with end-colostomy.  Pt with h/o cervical spondylosis= torticollis, GERD, muscle weakness, cardiomegaly.  He resides at [facility]. Pt was placed on vent from 3/22-3/26/2018.  He self extubated and pulled his NG yesterday.  Swallow evaluation ordered.  Pt has severe hearing loss and has some ongoing confusion per surgery note.   Type of Study: Bedside Swallow Evaluation Diet Prior to this Study: NPO Temperature Spikes Noted: No Respiratory Status: Room air History of Recent Intubation: Yes Length of Intubations (days): 5 days Date extubated: 09/17/16 (self extubated, 7 mm tube) Behavior/Cognition: Alert Oral Cavity Assessment: Dry Oral Cavity - Dentition: Poor condition Vision: Functional for self-feeding Self-Feeding Abilities: Other (Comment) (pt did not feed himself during session, confused and kept talking about need to drive to find his room) Patient Positioning: Postural control interferes with function Baseline Vocal Quality: Low vocal  intensity (mildly dysphonic) Volitional Cough:  (cough appeared strong but not productive, pt indicated cough in "chest" ) Volitional Swallow: Able to elicit    Oral/Motor/Sensory Function Overall Oral Motor/Sensory Function: Generalized oral weakness   Ice Chips Ice chips: Impaired Presentation: Spoon Pharyngeal Phase Impairments: Throat Clearing - Delayed;Cough - Delayed   Thin Liquid Thin Liquid: Not tested    Nectar Thick Nectar Thick Liquid: Impaired Presentation: Straw;Spoon Pharyngeal Phase Impairments: Multiple swallows;Cough - Delayed;Throat Clearing - Immediate   Honey Thick Honey Thick Liquid: Not tested   Puree Puree: Impaired Presentation: Spoon Pharyngeal Phase Impairments: Multiple swallows   Solid   GO   Solid: Not tested        Luanna Salk, [place] [facility] SLP [phone]

## 2016-09-18 NOTE — Progress Notes (Signed)
Nutrition Follow-up  DOCUMENTATION CODES:   Not applicable  INTERVENTION:  - Diet advancement per SLP recommendation. - RD will provide appropriate oral nutrition supplements following diet advancement.  NUTRITION DIAGNOSIS:   Inadequate oral intake related to inability to eat as evidenced by NPO status. -ongoing  GOAL:   Patient will meet greater than or equal to 90% of their needs -unmet  MONITOR:   Diet advancement, Weight trends, Labs, Skin  ASSESSMENT:   83 y.o. male presenting via EMS from [place] home with 1 week of sudden onset suprapubic pain which starts just below the navel and radiates down but doesn't involve his genitalia. He has had a small soft bowel movement yesterday. He also reports a productive cough for the past two weeks  3/27 Pt self-extubated ~1800 yesterday; OGT also removed at that time. Estimated nutrition needs updated based on this event. Weight now the lowest it has been since admission; will continue to monitor closely. SLP to see pt as he has hx of chronic dysphagia.   Medications reviewed; 10 mEq IV KCl x3 runs today, 40 mg IV Protonix BID. Labs reviewed; CBGs: 97 and 99 mg/dL this AM, K: 3.3 mmol/L, Phos: 4.7 mg/dL, BUN: 22 mg/dL, Ca: 8.5 mg/dL.    3/26 - Pt with OGT and receiving Vital High Protein @ 15 mL/hr.  - Reviewed all CCM and Surgery notes since previous RD note.  - God son, who is HCPOA, arrived on the unit a short time ago and team to discuss POC with him.  - Pt was originally made DNR with planned one-way extubation 3/25.  - Decision was later rescinded and plan to re-discuss today.  - Talked with RN and PCCM NP earlier this AM.  - Pt has not been able to successfully wean today. .  - Weight trend: 68.2-71 kg since 3/20; used CBW of 69.8 kg to re-estimate needs this AM.   Patient is currently intubated on ventilator support MV: 10.5 L/min Temp (24hrs), Avg:98.5 F (36.9 C), Min:98.1 F (36.7 C), Max:99.4 F  (37.4 C) BP: 99/59 and MAP: 65 K: 3.2 mmo/L, Phos: 4.7 mg/dL, Ca: 8.4 mg/dL. Drip: Heparin @ 700 units/hr.   3/23 - POD #1 lap colon resection of sigmoid with colonoscopy and hernial repair.  - Pt intubated with OGT in since time of procedure yesterday.  - Estimated nutrition needs updated based on this event.  - No family/visitors present this AM.  - Consult for TF initiation of trophic/trickle feeds only.  - Per notes, pt with chronic dysphagia and will be on aspiration precautions following extubation.  - Pt with stage 1 prostate cancer.   Patient is currently intubated on ventilator support MV: 10.7L/min Temp (24hrs), Avg:98.4 F (36.9 C), Min:96.8 F (36 C), Max:99.5 F (37.5 C) Propofol: none Phos: 1.2 mg/dL. IVF:D5-100 mEq sodium bicarb @ 75 mL/hr (306 kcal from dextrose).    Diet Order:  Diet NPO time specified  Skin:  Wound (see comment) (Stage 2 nonhealing L ankle pressure injury)  Last BM:  3/26  Height:   Ht Readings from Last 1 Encounters:  09/15/16 5\' 6"  (1.676 m)    Weight:   Wt Readings from Last 1 Encounters:  09/18/16 140 lb 3.4 oz (63.6 kg)    Ideal Body Weight:  64.54 kg  BMI:  Body mass index is 22.63 kg/m.  Estimated Nutritional Needs:   Kcal:  1525-1720 (24-27 kcal/kg)  Protein:  70-80 grams  Fluid:  >/= 1.7 L/day  EDUCATION NEEDS:  No education needs identified at this time    Jarome Matin, MS, RD, LDN, [facility] Inpatient Clinical Dietitian Pager # [phone] After hours/weekend pager # [phone]

## 2016-09-18 NOTE — Progress Notes (Signed)
ANTICOAGULATION CONSULT NOTE - Follow Up Consult  Pharmacy Consult for heparin Indication: atrial fibrillation  No Known Allergies  Patient Measurements: Height: 5\' 6"  (167.6 cm) Weight: 140 lb 3.4 oz (63.6 kg) IBW/kg (Calculated) : 63.8  Heparin Dosing Weight: using total body weight  Vital Signs: Temp: 96.9 F (36.1 C) (03/27 0701) Temp Source: Axillary (03/27 0701) BP: 107/58 (03/27 0600)  Labs:  Recent Labs  09/16/16 0319 09/16/16 1148 09/17/16 0336 09/18/16 0310  HGB 10.1*  --  10.8* 11.3*  HCT 30.0*  --  32.5* 33.9*  PLT 121*  --  150 158  APTT >200* 78* 69* 74*  HEPARINUNFRC 1.94* 1.18* 1.00* 1.05*  CREATININE 0.76  --  0.92 0.93   Estimated Creatinine Clearance: 48.4 mL/min (by C-G formula based on SCr of 0.93 mg/dL).  Assessment: 83yo M admitted with N/V and abdominal pain on apixaban for A.fib. Recurrent volvulus was unable to be resolved endoscopically. Pharmacy is asked to start heparin infusion while apixaban on hold.  First heparin level (anti-Xa level) high as expected given recent apixaban use.  Due to the effects of apixaban on heparin levels, using aPTT to guide therapy until levels correlate (effects of apixaban diminished).  Last dose of apixaban reported as 3/18 at 0900.    Significant events: 3/22 lap sigmoid colectomy with end-colostomy, repair of spigelian hernia. Heparin infusion resumed ~6 hours postop per surgery's instructions without bolus. 3/25 This morning's lab drawn inappropriately from same side heparin was infusing (aPTT > 200 sec, HL 1.94 units/ml).  Labs redrawn this afternoon following resumption of heparin = heparin level 1.18 and aPTT 78 seconds with heparin rate at 700 units/hr.  Today, 09/18/2016: -  APTT within therapeutic range, Hep level remains elevated,  - aPTT 74 sec, HL 1.05 units/ml - Hgb and platelets both low but stable, Plt improved to 158 - No bleeding documented.   Goal of Therapy:  aPTT 66-102 sec Heparin level  0.3-0.7 units/ml Monitor platelets by anticoagulation protocol: Yes   Plan:  Continue heparin infusion at 700 units/hr Check aPTT and heparin level tomorrow morning. Check CBC daily while on heparin. F/u for transition back to Eliquis once passes swallow study  Minda Ditto PharmD Pager [phone] 09/18/2016, 9:50 AM

## 2016-09-18 NOTE — Progress Notes (Signed)
[place] [facility] Attending Note  Presenting HPI:  83 y.o. male who resides at a SNF. He has a sig medical h/o AF, prior CVA, severe torticollis, mild dementia, BPH w/ slow growing prostate cancer, Chronic diastolic HF, chronic aspiration, CKD stage I and non-healing left ankle ulcer. Presented to ER 3/18 w/ abd pain and vomiting. Found to have recurrent sigmoid volvulus. He was seen by GI medicine w/ unsuccessful decompression attempt & it was felt that it could not be flipped endoscopically. He was seen by surgery who feels that surgical management would require permanent colostomy. Given his multiple medical co-morbids PCCM was asked to see and comment on pulmonary risk.   Subjective:  Patient self-extubated yesterday. Patient reports breathing is doing ok. Denies any nausea at this time. Has remained NPO since he extubated yesterday.   Review of Systems:  Patient denies any pain or nausea.   Temp:  [96.9 F (36.1 C)-98.2 F (36.8 C)] 96.9 F (36.1 C) (03/27 0701) Resp:  [18-29] 22 (03/27 0600) BP: (71-157)/(42-83) 107/58 (03/27 0600) SpO2:  [87 %-100 %] 100 % (03/27 0600) FiO2 (%):  [30 %] 30 % (03/26 1618) Weight:  [140 lb 3.4 oz (63.6 kg)] 140 lb 3.4 oz (63.6 kg) (03/27 0500)  Gen.: No family at bedside. Comfortable. No acute distress. Integument: No rash or bruising on exposed skin. Warm and dry. HEENT: Continued severe torticollis. Endotracheal tube removed. No scleral icterus. Moist mucous membranes. Neurological: Following commands. Answers to loud voice. Pupils symmetric. Pulmonary: Overall clear with auscultation. Normal work of breathing on room air. Intermittent coughing witnessed. Cardiovascular: Regular rate. No edema or JVD appreciated. Abdomen: Soft. Normal bowel sounds. Ostomy in place.  LINES/TUBES: OETT 7.0 3/22 - 3/26 (self-extubated) OGT 3/22 - 3/26 Foley 3/22 >>> PIV  CBC Latest Ref Rng & Units 09/18/2016 09/17/2016 09/16/2016  WBC 4.0 - 10.5 K/uL  5.0 6.3 6.3  Hemoglobin 13.0 - 17.0 g/dL 11.3(L) 10.8(L) 10.1(L)  Hematocrit 39.0 - 52.0 % 33.9(L) 32.5(L) 30.0(L)  Platelets 150 - 400 K/uL 158 150 121(L)    BMP Latest Ref Rng & Units 09/18/2016 09/17/2016 09/16/2016  Glucose 65 - 99 mg/dL 97 103(H) 110(H)  BUN 6 - 20 mg/dL 22(H) 17 13  Creatinine 0.61 - 1.24 mg/dL 0.93 0.92 0.76  Sodium 135 - 145 mmol/L 136 139 141  Potassium 3.5 - 5.1 mmol/L 3.3(L) 3.2(L) 3.6  Chloride 101 - 111 mmol/L 103 102 107  CO2 22 - 32 mmol/L 26 27 28   Calcium 8.9 - 10.3 mg/dL 8.5(L) 8.4(L) 7.9(L)   Hepatic Function Latest Ref Rng & Units 09/18/2016 09/13/2016 09/11/2016  Total Protein 6.5 - 8.1 g/dL - 6.2(L) 5.8(L)  Albumin 3.5 - 5.0 g/dL 2.9(L) 3.4(L) 3.2(L)  AST 15 - 41 U/L - 44(H) 23  ALT 17 - 63 U/L - 22 12(L)  Alk Phosphatase 38 - 126 U/L - 66 69  Total Bilirubin 0.3 - 1.2 mg/dL - 1.7(H) 1.4(H)    IMAGING/STUDIES: CT ABD/PELVIS 3/18:  Recurrent sigmoid colono volvulus resulting in upstream colonic obstruction. PORT CXR 3/26:  Previously reviewed by me. Endotracheal tube in acceptable position. Improving left lower lobe opacity with decreased silhouetting of left hemidiaphragm. No new right lung opacity. PORT CXR 3/27:  Personally reviewed by me. Endotracheal tube removed. Persistent mild silhouetting of left hemidiaphragm. No new focal opacity appreciated.  MICROBIOLOGY: MRSA PCR 2/19:  Positive  Urine Culture 2/21:  Proteus mirabilis  ANTIBIOTICS: Cefotan 3/22 (peri-op prophylaxis)  SIGNIFICANT EVENTS: 03/18 - Admit  03/21 - Started on Diltiazem drip 03/22 - Sigmoid resection & colostomy. Returned to ICU on ventilator. Started on Heparin drip post-op.   ASSESSMENT/PLAN:  83 y.o. male with sigmoid volvulus post resection and subsequent colostomy on 3/22. Patient self extubated yesterday and respiratory status remains stable. Atrial fibrillation rate controlled with diltiazem drip for now. Given patient's continued clinical stability we will  transition him to a stepdown bed.   1. Sigmoid volvulus: Status post sigmoid colectomy and colostomy on 3/22. Management per surgery service. 2. Acute hypoxic respiratory failure: Resolved. Ordering incentive spirometry for pulmonary toilette. Continuous monitoring with pulse oximetry. 3. Atrial fibrillation: Currently rate controlled with diltiazem drip. Continuing systemic anticoagulation with heparin drip. Monitoring patient on telemetry. Cardiology consulted. 4. Hypokalemia: Replaced with KCl 30 mEq IV this morning. Monitoring electrolytes daily. 5. Anemia: No evidence of active bleeding. Hemoglobin stable. Trending cell counts daily with CBC while on heparin drip. 6. Hypernatremia/hyperchloremia: Resolved. 7. Chronic dysphasia: Consult speech therapy for dysphagia. 8. History of prostate cancer: Not currently on treatment. 9. History of CVA with deconditioning/dementia/significant hearing loss. Minimizing sedating medications. PT consulted. 10. Chronic diastolic congestive heart failure: Currently euvolemic. Holding on further diuresis.  Prophylaxis:  Protonix IV q12hr & therapeutic anticoagulation with heparin drip per pharmacy protocol.  Diet:  NPO pending speech evaluation. Code Status:  Patient's code status changed to Full DNR yesterday after he self-extubated. Disposition:  Transitioning to Stepdown unit today. Family Update:  HCPOA updated yesterday.  I have spent a total of 36 minutes of time today caring for the patient, reviewing the patient's electronic medical record, and with more than 50% of that time spent coordinating care with the patient as well as reviewing the continuing plan of care with the patient and his nurse at bedside.  TRH to assume care & PCCM off as of 3/28.   Sonia Baller Ashok Cordia, M.D. [facility] Pager:  [phone] After 3pm or if no response, call [phone] 8:51 AM 09/18/16

## 2016-09-18 NOTE — Progress Notes (Signed)
5 Days Post-Op  Subjective: Pt pulled his NG and endotracheal tube out yesterday. He is to get a swallowing study today and if that is safe we can try him on some liquids.  He cannot hear at all today.  He can't really tell me where he is and what he does talk about shows some ongoing confusion.  Objective: Vital signs in last 24 hours: Temp:  [96.9 F (36.1 C)-98 F (36.7 C)] 98 F (36.7 C) (03/27 1200) Resp:  [18-31] 29 (03/27 1200) BP: (71-157)/(42-77) 102/58 (03/27 1200) SpO2:  [87 %-100 %] 98 % (03/27 1200) FiO2 (%):  [30 %] 30 % (03/26 1618) Weight:  [63.6 kg (140 lb 3.4 oz)] 63.6 kg (140 lb 3.4 oz) (03/27 0500) Last BM Date: 09/15/16 1062 IV 3825  Urine 30 stool Afebrile,  BP 65-035 systolic BP MAP  >46 per CCM K+ 3.3/MAG 2.0 CBC stable CXR this AM:  Cardiac shadow is mildly enlarged but stable. The upper portion of the exam is somewhat limited due to the overlying neck and skull. The lungs are well aerated without focal infiltrate. The nasogastric catheter and endotracheal tube have been removed in the interval.    Intake/Output from previous day: 03/26 0701 - 03/27 0700 In: 1062 [I.V.:297; NG/GT:265; IV Piggyback:500] Out: 5681 [Urine:3825; Stool:30] Intake/Output this shift: Total I/O In: 103 [I.V.:103] Out: 20 [Stool:20]  General appearance: alert, cooperative and no distress Resp: clear to auscultation bilaterally GI: soft, not tender, stool/TF consistency in his ostomy bag.  Incision ok.    Lab Results:   Recent Labs  09/17/16 0336 09/18/16 0310  WBC 6.3 5.0  HGB 10.8* 11.3*  HCT 32.5* 33.9*  PLT 150 158    BMET  Recent Labs  09/17/16 0336 09/18/16 0310  NA 139 136  K 3.2* 3.3*  CL 102 103  CO2 27 26  GLUCOSE 103* 97  BUN 17 22*  CREATININE 0.92 0.93  CALCIUM 8.4* 8.5*   PT/INR No results for input(s): LABPROT, INR in the last 72 hours.   Recent Labs Lab 09/13/16 0533 09/18/16 0310  AST 44*  --   ALT 22  --   ALKPHOS 66  --    BILITOT 1.7*  --   PROT 6.2*  --   ALBUMIN 3.4* 2.9*     Lipase     Component Value Date/Time   LIPASE 20 09/09/2016 1232     Studies/Results: Dg Chest Port 1 View  Result Date: 09/18/2016 CLINICAL DATA:  Respiratory failure EXAM: PORTABLE CHEST 1 VIEW COMPARISON:  09/17/2016 FINDINGS: Cardiac shadow is mildly enlarged but stable. The upper portion of the exam is somewhat limited due to the overlying neck and skull. The lungs are well aerated without focal infiltrate. The nasogastric catheter and endotracheal tube have been removed in the interval. IMPRESSION: No acute abnormality noted. Electronically Signed   By: Inez Catalina M.D.   On: 09/18/2016 07:05   Dg Chest Port 1 View  Result Date: 09/17/2016 CLINICAL DATA:  Acute respiratory failure. Atrial fibrillation, CHF, nonsmoker. EXAM: PORTABLE CHEST 1 VIEW COMPARISON:  Portable chest x-ray of September 16, 2016 FINDINGS: The lungs are mildly hypoinflated. The endotracheal tube tip lies 2.7 cm above the carina. The esophagogastric tube tip lies in the proximal gastric body. Bibasilar densities have improved somewhat. The cardiac silhouette remains enlarged. The pulmonary vascularity is not engorged. IMPRESSION: Mild hypoinflation. Improving bibasilar atelectasis or pneumonia. No pulmonary edema or significant pleural effusion. The support tubes are in reasonable position.  Electronically Signed   By: David  Martinique M.D.   On: 09/17/2016 07:03    Medications: . Chlorhexidine Gluconate Cloth  6 each Topical Q0600  . mupirocin ointment  1 application Nasal BID  . pantoprazole (PROTONIX) IV  40 mg Intravenous Q12H  . sodium chloride flush  3 mL Intravenous Q12H   . diltiazem (CARDIZEM) infusion 5 mg/hr (09/18/16 1200)  . heparin 700 Units/hr (09/18/16 1200)   Assessment/Plan Recurrent sigmoid volvulus, Right lower quadrant spigelian hernia s/p Laparoscopic-assisted sigmoid colectomy with end-colostomy, Hartmann's procedure; Laparoscopic  repair of spigelian hernia primarily, 09/13/16, Dr. Greer Pickerel  POD 5 Post op ventilator dependence Hypokalemia - CCM following Severe torticollis Severe hearing deficit Hx of CVA History of congestive heart failure History of chronic renal insufficiency - creatinine is stable and remains normal History of prostate cancer - slow growing Osteoarthritis GERD DNR FEN:IV fluids/Intubated ID: Cefotetan pre op 09/13/16 DVT: Heparin drip   Plan:  Remove waffle dressing with next ostomy bag change.  Swallow study and if OK start him on clears.    LOS: 9 days    Jamin Humphries 09/18/2016 [phone]

## 2016-09-18 NOTE — Progress Notes (Signed)
Date:  September 18, 2016 Chart reviewed for concurrent status and case management needs. Will continue to follow patient progress. Self extubated, family updated on condition/code status changed to full No code Blue-DNR. Discharge Planning: following for needs Expected discharge date: [id number] Velva Harman, BSN, [place], [place]

## 2016-09-18 NOTE — Progress Notes (Signed)
[facility] ADULT ICU REPLACEMENT PROTOCOL FOR AM LAB REPLACEMENT ONLY  The patient does apply for the [facility] Adult ICU Electrolyte Replacment Protocol based on the criteria listed below:   1. Is GFR >/= 40 ml/min? Yes.    Patient's GFR today is >60 2. Is urine output >/= 0.5 ml/kg/hr for the last 6 hours? Yes.   Patient's UOP is 0.59 ml/kg/hr 3. Is BUN < 60 mg/dL? Yes.    Patient's BUN today is 22 4. Abnormal electrolyte(s): K+3.3 5. Ordered repletion with: Protocol 6. If a panic level lab has been reported, has the CCM MD in charge been notified? No..   Physician:  Joanne Gavel 09/18/2016 4:17 AM

## 2016-09-18 NOTE — Progress Notes (Signed)
MRSA swab negative.Contact precautions d/c per infection control.

## 2016-09-18 NOTE — Progress Notes (Signed)
CSW continues to follow to assist with d/c planning. [facility] contacted and updated. SNF will readmit, once stable for dc, providing there is availability. CSW will continue to follow to assist with d/c planning needs.  Werner Lean LCSW [phone]

## 2016-09-18 NOTE — Consult Note (Signed)
[facility] Nurse ostomy follow up Stoma type/location: LUQ colostomy. Activities of past 48 hours appreciated. Stomal assessment/size: visualized through pouch, red, moist, edematous Peristomal assessment: Not seen today Treatment options for stomal/peristomal skin: skin barrier rings Output: thin discharge (stool) in pouch Ostomy pouching: 2pc. 4-inch pouching system in tact and in place.  Will monitor and change later this week. Supplies at bedside. Education provided: None today Enrolled patient in [employer] Discharge program: No WOC nursing team will follow,and will remain available to this patient, the nursing, surgical and medical teams.   Thanks, Maudie Flakes, MSN, RN, [place], Arther Abbott  Pager# [phone]

## 2016-09-19 ENCOUNTER — Inpatient Hospital Stay (HOSPITAL_COMMUNITY): Payer: Medicare Other

## 2016-09-19 LAB — GLUCOSE, CAPILLARY
GLUCOSE-CAPILLARY: 88 mg/dL (ref 65–99)
GLUCOSE-CAPILLARY: 82 mg/dL (ref 65–99)
GLUCOSE-CAPILLARY: 100 mg/dL — AB (ref 65–99)
Glucose-Capillary: 105 mg/dL — ABNORMAL HIGH (ref 65–99)

## 2016-09-19 LAB — CBC WITH DIFFERENTIAL/PLATELET
BASOS ABS: 0 10*3/uL (ref 0.0–0.1)
Basophils Relative: 0 %
EOS PCT: 3 %
Eosinophils Absolute: 0.2 10*3/uL (ref 0.0–0.7)
HEMATOCRIT: 34.6 % — AB (ref 39.0–52.0)
HEMOGLOBIN: 11.2 g/dL — AB (ref 13.0–17.0)
LYMPHS ABS: 1.4 10*3/uL (ref 0.7–4.0)
LYMPHS PCT: 27 %
MCH: 29.1 pg (ref 26.0–34.0)
MCHC: 32.4 g/dL (ref 30.0–36.0)
MCV: 89.9 fL (ref 78.0–100.0)
Monocytes Absolute: 0.3 10*3/uL (ref 0.1–1.0)
Monocytes Relative: 7 %
NEUTROS ABS: 3.3 10*3/uL (ref 1.7–7.7)
NEUTROS PCT: 63 %
Platelets: 185 10*3/uL (ref 150–400)
RBC: 3.85 MIL/uL — AB (ref 4.22–5.81)
RDW: 17.7 % — ABNORMAL HIGH (ref 11.5–15.5)
WBC: 5.3 10*3/uL (ref 4.0–10.5)

## 2016-09-19 LAB — RENAL FUNCTION PANEL
ALBUMIN: 3.1 g/dL — AB (ref 3.5–5.0)
Anion gap: 7 (ref 5–15)
BUN: 19 mg/dL (ref 6–20)
CHLORIDE: 107 mmol/L (ref 101–111)
CO2: 25 mmol/L (ref 22–32)
Calcium: 8.6 mg/dL — ABNORMAL LOW (ref 8.9–10.3)
Creatinine, Ser: 0.88 mg/dL (ref 0.61–1.24)
GFR calc Af Amer: >60 mL/min (ref >60)
Glucose, Bld: 88 mg/dL (ref 65–99)
PHOSPHORUS: 2.9 mg/dL (ref 2.5–4.6)
POTASSIUM: 3.7 mmol/L (ref 3.5–5.1)
Sodium: 139 mmol/L (ref 135–145)

## 2016-09-19 LAB — HEPARIN LEVEL (UNFRACTIONATED): Heparin Unfractionated: 0.83 IU/mL — ABNORMAL HIGH (ref 0.30–0.70)

## 2016-09-19 LAB — APTT: aPTT: 74 seconds — ABNORMAL HIGH (ref 24–36)

## 2016-09-19 LAB — MAGNESIUM: Magnesium: 2.2 mg/dL (ref 1.7–2.4)

## 2016-09-19 MED ORDER — RESOURCE THICKENUP CLEAR PO POWD
ORAL | Status: DC | PRN
  Filled 2016-09-19: qty 125

## 2016-09-19 MED ORDER — DILTIAZEM HCL 60 MG PO TABS
30.0000 mg | ORAL_TABLET | Freq: Three times a day (TID) | ORAL | Status: DC
  Administered 2016-09-19 – 2016-09-21 (×7): 30 mg via ORAL
  Filled 2016-09-19 (×7): qty 1

## 2016-09-19 MED ORDER — APIXABAN 5 MG PO TABS
5.0000 mg | ORAL_TABLET | Freq: Two times a day (BID) | ORAL | Status: DC
  Administered 2016-09-19 – 2016-09-21 (×5): 5 mg via ORAL
  Filled 2016-09-19 (×5): qty 1

## 2016-09-19 NOTE — Progress Notes (Signed)
[facility] Progress Note  Patient: Eduardo Deleon PNT:[id number]   PCP: Pcp Not In System DOB: [birth year]   DOA: 09/09/2016   DOS: 09/19/2016   Date of Service: the patient was seen and examined on 09/19/2016  Subjective: Patient is feeling well, denies any acute pain anywhere, his breathing is getting better, denies any nausea. Communication was done with the use of recent fevers the patient is to  Brief hospital course: Pt. with PMH of atrial fibrillation, CVA, severe torticollis pH, dementia, prostate cancer, CHF, chronic aspiration; admitted on 09/09/2016, with complaint of abdominal pain and distention, was found to have sigmoid volvulus. He was seen by GI and had an unsuccessful attempt at decompression. Surgery evaluated the patient and patient underwent permanent colostomy. The patient was admitted to CCM. Currently further plan is monitor diet advance.  Assessment and Plan: 1. 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 Surgery was consulted. Currently has signed off. We'll advance the diet to clear liquid diet with nectar thick. We will discuss with surgery regarding further diet advance 1. Still has sutures she needs to be removed in 7-10 days. Good output from the colostomy bag.  2. Acute hypoxic respiratory failure. 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 Self extubated on 09/18/2016. Patient high risk for recurrent aspiration pneumonia, continues to have some bilateral rhonchi  check with chest x-ray. Continue incentive spirometry and pulmonary toilet.  3. A. fib with RVR. Cardiology was consulted. Currently rate controlled, we'll transition him to oral cortisone. Also on heparin drip on Eliquis at home. Transition back to Eliquis.  5. Chronic diastolic CHF. Currently euvolemic monitor.  6. Chronic anemia. At creatinine deficiency. No active bleeding. Monitor H&H.  7. Hypokalemia. Monitor and  replace.  8. History of CVA. On therapeutic anticoagulation.  9. Goals of care discussion. Discussed with patient he would not want to go back to ventilator or having any surgery. Discussed with POA who agrees with this recommendation. Patient is at high risk for recurrent aspiration pneumonia as well as recurrent hospitalization. I recommended continuing palliative care approach at the SNF along with therapy.  Diet: clear liquid, nectar thick DVT Prophylaxis: therapeutic anticoagulation  Advance goals of care discussion: DNR DNI, se above  Family Communication: family was present at bedside, at the time of interview. The pt provided permission to discuss medical plan with the family. Opportunity was given to ask question and all questions were answered satisfactorily.   Disposition:  Discharge to SNF. Expected discharge date: Friday,   Consultants: Gen. surgery, CCM, cardiology Procedures: Sigmoid colectomy and colostomy  Antibiotics: Anti-infectives    Start     Dose/Rate Route Frequency Ordered Stop   09/13/16 0946  cefoTEtan in Dextrose 5% (CEFOTAN) 2-2.08 GM-% IVPB    Comments:  Key, Kristopher   : cabinet override      09/13/16 0946 09/13/16 1102   09/13/16 0700  cefoTEtan in Dextrose 5% (CEFOTAN) IVPB 2 g     2 g Intravenous On call 09/12/16 1604 09/13/16 1102       Objective: Physical Exam: Vitals:   09/19/16 0900 09/19/16 1000 09/19/16 1100 09/19/16 1200  BP: 107/60 110/77 127/65   Pulse:      Resp: (!) 22 17 (!) 26   Temp:    98.3 F (36.8 C)  TempSrc:    Oral  SpO2: 99% 99% 99%   Weight:      Height:        Intake/Output Summary (Last 24  hours) at 09/19/16 1305 Last data filed at 09/19/16 1100  Gross per 24 hour  Intake              451 ml  Output              850 ml  Net             -399 ml   Filed Weights   09/17/16 0700 09/18/16 0500 09/19/16 0500  Weight: 69.8 kg (153 lb 14.1 oz) 63.6 kg (140 lb 3.4 oz) 63.5 kg (139 lb 15.9 oz)   General:  Alert, Awake and Oriented to Place and Person. Appear in mild distress, affect appropriate Eyes: PERRL, Conjunctiva normal ENT: Oral Mucosa clear moist. Severe torticolis Neck: no JVD, no Abnormal Mass Or lumps Cardiovascular: S1 and S2 Present, no Murmur, Respiratory: Bilateral Air entry equal and Decreased, no use of accessory muscle, bilateral Crackles, no wheezes Abdomen: Bowel Sound present, Soft and no tenderness Skin: o redness, no Rash, no induration Extremities: no Pedal edema, no calf tenderness Neurologic: Grossly no focal neuro deficit. Bilaterally Equal motor strength  Data Reviewed: CBC:  Recent Labs Lab 09/15/16 0305 09/16/16 0319 09/17/16 0336 09/18/16 0310 09/19/16 0312  WBC 5.4 6.3 6.3 5.0 5.3  NEUTROABS  --   --   --  2.9 3.3  HGB 10.1* 10.1* 10.8* 11.3* 11.2*  HCT 30.5* 30.0* 32.5* 33.9* 34.6*  MCV 88.4 88.0 87.4 88.5 89.9  PLT 118* 121* 150 158 846   Basic Metabolic Panel:  Recent Labs Lab 09/15/16 1604 09/16/16 0319 09/16/16 1610 09/17/16 0336 09/18/16 0310 09/19/16 0312  NA 142 141  --  139 136 139  K 3.9 3.6  --  3.2* 3.3* 3.7  CL 110 107  --  102 103 107  CO2 26 28  --  27 26 25   GLUCOSE 152* 110*  --  103* 97 88  BUN 12 13  --  17 22* 19  CREATININE 0.79 0.76  --  0.92 0.93 0.88  CALCIUM 8.1* 7.9*  --  8.4* 8.5* 8.6*  MG 2.3 2.1  --  2.0 2.0 2.2  PHOS  --  3.0 4.0 4.7* 4.7* 2.9    Liver Function Tests:  Recent Labs Lab 09/13/16 0533 09/18/16 0310 09/19/16 0312  AST 44*  --   --   ALT 22  --   --   ALKPHOS 66  --   --   BILITOT 1.7*  --   --   PROT 6.2*  --   --   ALBUMIN 3.4* 2.9* 3.1*   No results for input(s): LIPASE, AMYLASE in the last 168 hours. No results for input(s): AMMONIA in the last 168 hours. Coagulation Profile: No results for input(s): INR, PROTIME in the last 168 hours. Cardiac Enzymes: No results for input(s): CKTOTAL, CKMB, CKMBINDEX, TROPONINI in the last 168 hours. BNP (last 3 results) No results for  input(s): PROBNP in the last 8760 hours. CBG:  Recent Labs Lab 09/18/16 1928 09/18/16 2320 09/19/16 0328 09/19/16 0739 09/19/16 1115  GLUCAP 87 89 88 82 100*   Studies: No results found.  Scheduled Meds: . chlorhexidine  15 mL Mouth Rinse BID  . diltiazem  30 mg Oral Q8H  . mouth rinse  15 mL Mouth Rinse q12n4p  . pantoprazole (PROTONIX) IV  40 mg Intravenous Q12H  . sodium chloride flush  3 mL Intravenous Q12H   Continuous Infusions: . diltiazem (CARDIZEM) infusion 5 mg/hr (09/19/16 1100)  . heparin  700 Units/hr (09/19/16 1100)   PRN Meds: metoprolol, RESOURCE THICKENUP CLEAR  Time spent: 35 minutes  Author: Berle Mull, MD [facility] Pager: [phone] 09/19/2016 1:05 PM  If 7PM-7AM, please contact night-coverage at [email or web address], password [facility]

## 2016-09-19 NOTE — Progress Notes (Addendum)
ANTICOAGULATION CONSULT NOTE - Follow Up Consult  Pharmacy Consult for heparin > Apixaban Indication: atrial fibrillation  No Known Allergies  Patient Measurements: Height: 5\' 6"  (167.6 cm) Weight: 139 lb 15.9 oz (63.5 kg) IBW/kg (Calculated) : 63.8  Heparin Dosing Weight: using total body weight  Vital Signs: Temp: 97.9 F (36.6 C) (03/28 0800) Temp Source: Oral (03/28 0800) BP: 104/62 (03/28 0800)  Labs:  Recent Labs  09/17/16 0336 09/18/16 0310 09/19/16 0312  HGB 10.8* 11.3* 11.2*  HCT 32.5* 33.9* 34.6*  PLT 150 158 185  APTT 69* 74* 74*  HEPARINUNFRC 1.00* 1.05* 0.83*  CREATININE 0.92 0.93 0.88   Estimated Creatinine Clearance: 51.1 mL/min (by C-G formula based on SCr of 0.88 mg/dL).  Assessment: 83yo M admitted with N/V and abdominal pain on apixaban for A.fib. Recurrent volvulus was unable to be resolved endoscopically. Pharmacy is asked to start heparin infusion while apixaban on hold.  First heparin level (anti-Xa level) high as expected given recent apixaban use.  Due to the effects of apixaban on heparin levels, using aPTT to guide therapy until levels correlate (effects of apixaban diminished).  Last dose of apixaban reported as 3/18 at 0900.    Significant events: 3/22 lap sigmoid colectomy with end-colostomy, repair of spigelian hernia. Heparin infusion resumed ~6 hours postop per surgery's instructions without bolus. 3/25 This morning's lab drawn inappropriately from same side heparin was infusing (aPTT > 200 sec, HL 1.94 units/ml).  Labs redrawn this afternoon following resumption of heparin = heparin level 1.18 and aPTT 78 seconds with heparin rate at 700 units/hr. 3/27: Failed swallow study, unable to resume Apixaban (Eliquis) at this time.  Today, 09/19/2016: -  APTT within therapeutic range, Hep level finally reduced (anticipate d/c aPTT after tomorrow) - aPTT 74 sec, HL 83 units/ml - Hgb and platelets both low but stable, Plt improved to 185 - No  bleeding documented   Goal of Therapy:  aPTT 66-102 sec Heparin level 0.3-0.7 units/ml Monitor platelets by anticoagulation protocol: Yes   Plan:  Continue heparin infusion at 700 units/hr Check aPTT and heparin level tomorrow morning. Check CBC daily while on heparin.  Minda Ditto PharmD Pager [phone] 09/19/2016, 10:57 AM  Addendum 13:45  Discontinue Heparin infusion, d/c aPTT, Hep level Resume Apixaban 5mg  bid, stop Heparin infusion at same time  Minda Ditto PharmD Pager [phone] 09/19/2016, 1:45 PM

## 2016-09-19 NOTE — Progress Notes (Signed)
   Has remained stable & improving steadily - extubated. Plans are d/c to SNF soom.  HC sign off note -- rates have remained stable. Convert to pO CCB & restart oral anticoagulation for d/c.   We will sign off.  Glenetta Hew, MD

## 2016-09-19 NOTE — NC FL2 (Signed)
[place] LEVEL OF CARE SCREENING TOOL     IDENTIFICATION  Patient Name: Eduardo Deleon Birthdate: [birth year] Sex: male Admission Date (Current Location): 09/09/2016  [facility] and [place] Number:  Herbalist and Address:  [facility],  [place] [address] [place]      Provider Number: [id number]  Attending Physician Name and Address:  Lavina Hamman, MD  Relative Name and Phone Number:       Current Level of Care: Hospital Recommended Level of Care: [place] Prior Approval Number:    Date Approved/Denied:   PASRR Number: [id number]  Discharge Plan: SNF    Current Diagnoses: Patient Active Problem List   Diagnosis Date Noted  . S/P colon resection 09/13/2016  . Preoperative cardiovascular examination   . SBO (small bowel obstruction)   . Abdominal pain   . Abnormal CT of the abdomen   . Abdominal distension   . Generalized abdominal pain   . Pressure injury of skin 04/11/2016  . Hypokalemia   . Volvulus ([place]) 04/10/2016  . Sigmoid volvulus ([place])   . Venous stasis dermatitis of both lower extremities 05/26/2015  . Non-compliant behavior 04/27/2014  . Acute respiratory failure ([place]) 03/29/2014  . Prostate CA ([place]) 10/11/2013  . Elevated brain natriuretic peptide (BNP) level 10/11/2013  . Altered mental status 10/08/2013  . Atrial fibrillation ([place]) 11/21/2012  . Long term current use of anticoagulant therapy 11/21/2012  . Heme positive stool 06/29/2011  . Lower extremity venous stasis 06/28/2011  . Torticollis 06/28/2011  . CRI (chronic renal insufficiency) 06/28/2011  . Anemia 05/20/2011  . GERD (gastroesophageal reflux disease) 05/19/2011  . BPH (benign prostatic hyperplasia) 05/19/2011  . Chronic systolic CHF (congestive heart failure) ([place]) 05/19/2011    Orientation RESPIRATION BLADDER Height & Weight     Self, Place  Normal Incontinent Weight: 139 lb 15.9 oz (63.5 kg) Height:  5\' 6"   (167.6 cm)  BEHAVIORAL SYMPTOMS/MOOD NEUROLOGICAL BOWEL NUTRITION STATUS  Other (Comment) (no behaviors)   Colostomy Diet (clears)  AMBULATORY STATUS COMMUNICATION OF NEEDS Skin   Extensive Assist Verbally Surgical wounds                       Personal Care Assistance Level of Assistance  Bathing, Feeding, Dressing Bathing Assistance: Maximum assistance Feeding assistance: Maximum assistance Dressing Assistance: Maximum assistance     Functional Limitations Info  Sight, Hearing, Speech Sight Info: Adequate Hearing Info: Impaired Speech Info: Adequate    SPECIAL CARE FACTORS FREQUENCY                       Contractures Contractures Info: Not present    Additional Factors Info  Code Status Code Status Info: DNR             Current Medications (09/19/2016):  This is the current hospital active medication list Current Facility-Administered Medications  Medication Dose Route Frequency Provider Last Rate Last Dose  . chlorhexidine (PERIDEX) 0.12 % solution 15 mL  15 mL Mouth Rinse BID Praveen Mannam, MD      . diltiazem (CARDIZEM) 100 mg in dextrose 5 % 100 mL (1 mg/mL) infusion  5-15 mg/hr Intravenous Titrated Praveen Mannam, MD 5 mL/hr at 09/19/16 1100 5 mg/hr at 09/19/16 1100  . diltiazem (CARDIZEM) tablet 30 mg  30 mg Oral Q8H Lavina Hamman, MD   30 mg at 09/19/16 1220  . heparin ADULT infusion 100 units/mL (25000 units/286mL  sodium chloride 0.45%)  700 Units/hr Intravenous Continuous Dorrene German, RPH 7 mL/hr at 09/19/16 1100 700 Units/hr at 09/19/16 1100  . MEDLINE mouth rinse  15 mL Mouth Rinse q12n4p Praveen Mannam, MD      . metoprolol (LOPRESSOR) injection 5 mg  5 mg Intravenous Q4H PRN Praveen Mannam, MD   5 mg at 09/15/16 1445  . pantoprazole (PROTONIX) injection 40 mg  40 mg Intravenous Q12H Erick Colace, NP   40 mg at 09/19/16 0940  . RESOURCE THICKENUP CLEAR   Oral PRN Lavina Hamman, MD      . sodium chloride flush (NS) 0.9 % injection 3 mL   3 mL Intravenous Q12H Nita Sells, MD   3 mL at 09/19/16 1000     Discharge Medications: Please see discharge summary for a list of discharge medications.  Relevant Imaging Results:  Relevant Lab Results:   Additional Information SSN [id number]. 08/13/16 MRSA + by PCR  Artin Mceuen, Randall An, LCSW

## 2016-09-19 NOTE — Progress Notes (Signed)
  Speech Language Pathology Treatment: Dysphagia  Patient Details Name: Eduardo Deleon MRN: [id number] DOB: Jun 08, 1925 Today's Date: 09/19/2016 Time: 5681-2751 SLP Time Calculation (min) (ACUTE ONLY): 30 min  Assessment / Plan / Recommendation Clinical Impression  Pt today appears with improved voice and mentation but continues with congested nonproductive cough (which he reports is near baseline).  Cough at baseline noted = suspect due to his CHF.   Pt denies problems swallowing prior to admission and states he did not see a speech therapist at [facility] and did not have swallow treatment.    SlP provided pt with ice chips, applesauce, nectar juice and thin water.  He presents with intermittent cough and dry swallows during intake.  Overt immediate congested cough noted with thin water and pt reports he "drank too much".  Suspect dysphagia due to known GERD hx, ? Impact of intubation/self extubation causing edema, and cervical spondylosis.  However upon further examination of pt, MBS not able to be conducted due to his cervical spondylosis.  Clinically pt tolerated nectar better than thins - likely due to improved oral control/bolus flow.   Pt does state he would like to eat and drink even if he is having some aspiration - Note referral for palliative consult. Will follow for tolerance of nectar diet  And ice chip allowance.  Pt educated to findings/recommendations using teach back with written information (as he is deaf).     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  Pt with h/o cervical spondylosis= torticollis, GERD, muscle weakness, cardiomegaly.  He resides at [facility]. Pt was placed on vent from 3/22-3/26/2018.  He self extubated and pulled his NG yesterday.  Swallow evaluation ordered.  Pt has severe hearing loss and has some ongoing confusion per surgery note.        SLP Plan  Continue with current plan of care       Recommendations  Diet recommendations: Nectar-thick liquid;Other(comment) (clears- nectar thick) Medication Administration: Whole meds with puree Compensations: Minimize environmental distractions;Slow rate;Small sips/bites Postural Changes and/or Swallow Maneuvers: Seated upright 90 degrees;Upright 30-60 min after meal                Oral Care Recommendations: Oral care QID SLP Visit Diagnosis: Dysphagia, pharyngeal phase (R13.13) Plan: Continue with current plan of care       [place], [facility] SLP [phone]

## 2016-09-19 NOTE — Progress Notes (Addendum)
  Speech Language Pathology Treatment: Dysphagia  Patient Details Name: Eduardo Deleon MRN: [id number] DOB: 30-Nov-1924 Today's Date: 09/19/2016 Time: 9211-9417 SLP Time Calculation (min) (ACUTE ONLY): 25 min  Assessment / Plan / Recommendation Clinical Impression  SLP heard from SNF SLP who reports pt was on a regular/thin diet at SNF and did not desire SLP treatment or diet modification.  Provided written information to pt re: this information SLP received to which he stated "That's right".   Prior FEES study in 2015 showed (after pt was intubated) severe pharyngeal dysphagia with aspiration across consistencies and residuals.  Cough was present inconsistently with aspiration on FEES.  Pt at that time pt underwent palliative referral and consumed "comfort po".    SLP provided Pocket Talker *amplifier* for pt to try but it was not effective despite multiple headphone styles/volume control modulation.  At SNF, white board is used for communication.  Writing is only effective form of communication with pt that this SLP found effective.  Observed pt consuming nectar thickened juice via straw - intermittent congested cough noted during po.  Unfortunately cough is not productive.  Nectar thickened liquids may marginally mitigate pt's aspiration risk but do not anticipate to be preventative.  Pt admits to premorbid coughing with intake and states swallowing is "normal" per SLP direct question cues.    Will follow up for pt education/help in guiding pt's plan of care with his suspected acute on chronic dysphagia.      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  Pt with h/o cervical spondylosis= torticollis, GERD, muscle weakness, cardiomegaly.  He resides at [facility]. Pt was placed on vent from 3/22-3/26/2018.  He self extubated and pulled his NG yesterday.  Swallow evaluation ordered.  Pt has severe  hearing loss and has some ongoing confusion per surgery note.        SLP Plan  Continue with current plan of care       Recommendations  Diet recommendations: Nectar-thick liquid (clears) Medication Administration: Whole meds with puree (crush with puree if large) Compensations: Minimize environmental distractions;Slow rate;Small sips/bites Postural Changes and/or Swallow Maneuvers: Seated upright 90 degrees;Upright 30-60 min after meal                Oral Care Recommendations: Oral care QID SLP Visit Diagnosis: Dysphagia, pharyngeal phase (R13.13) Plan: Continue with current plan of care       [place], [facility] SLP [phone]

## 2016-09-19 NOTE — Consult Note (Signed)
[place] Nurse ostomy follow up Stoma type/location: LUQ colostomy Stomal assessment/size: 2 inches Peristomal assessment: intact, clear Treatment options for stomal/peristomal skin: skin barrier ring Output: brown liquid stool Ostomy pouching: 2pc., 2 and 3/4 inch pouching system with skin barrier ring.  Three skin barriers, 3 pouches and 3 skin barrier rings (along with a sizing guide) are in the room packaged for discharge back to facility. Education provided: None today Enrolled patient in [employer] Discharge program: No. Patient will be returning to the extended care facility at which he resides.  Pouches are changed twice weekly (on Wednesday and Saturdays) and a new one is applied today. The supplies are not specialty ostomy supplies and can be easily obtained via suppliers. [facility] nursing team will follow while in house, and will remain available to this patient, the nursing, surgical and medical teams.   Thanks, Maudie Flakes, MSN, RN, [place], Arther Abbott  Pager# [phone]

## 2016-09-19 NOTE — Progress Notes (Signed)
Mr Eduardo Deleon was still getting situated from transfer from ICU.   He is agreeable to me coming back tomorrow morning.  We have set a tentative time of [place], MD [place] Team [phone]

## 2016-09-19 NOTE — Progress Notes (Signed)
PT Cancellation Note  Patient Details Name: Eduardo Deleon MRN: [id number] DOB: 02/10/1925   Cancelled Treatment:     PT order received but pt continues to refuse after multiple attempts.  Pt states he does not move much at SNF and maybe we could try back next week after he has time to get himself "psyched up".   Makalynn Berwanger 09/19/2016, 12:08 PM

## 2016-09-19 NOTE — Progress Notes (Signed)
CSW following to assist with d/c planning. CSW met with Attending / HCPOA ( Mr. Lucy Antigua ) this afternoon. [facility] is willing to accept pt with palliative care once stable for d/c provided bed is available. Attending reports pt may be ready for d/c on Friday ( earliest). [facility] is aware and will contact CSW asap if bed will not be available. HPOA aware that new SNF my be needed.  CSW will continue to follow to assist with d/c planning needs.  Werner Lean LCSW [phone]

## 2016-09-19 NOTE — Progress Notes (Signed)
Patient transferred from ICU. Vital signs stable. Patient has no complaints. Will continue to monitor patient. Telemetry applied. Call bell within reach.

## 2016-09-20 DIAGNOSIS — R1310 Dysphagia, unspecified: Secondary | ICD-10-CM

## 2016-09-20 DIAGNOSIS — Z515 Encounter for palliative care: Secondary | ICD-10-CM

## 2016-09-20 DIAGNOSIS — Z7189 Other specified counseling: Secondary | ICD-10-CM

## 2016-09-20 LAB — GLUCOSE, CAPILLARY
Glucose-Capillary: 89 mg/dL (ref 65–99)
Glucose-Capillary: 92 mg/dL (ref 65–99)
Glucose-Capillary: 102 mg/dL — ABNORMAL HIGH (ref 65–99)
Glucose-Capillary: 114 mg/dL — ABNORMAL HIGH (ref 65–99)
Glucose-Capillary: 94 mg/dL (ref 65–99)
Glucose-Capillary: 93 mg/dL (ref 65–99)
Glucose-Capillary: 91 mg/dL (ref 65–99)

## 2016-09-20 LAB — BASIC METABOLIC PANEL
Anion gap: 6 (ref 5–15)
BUN: 17 mg/dL (ref 6–20)
CALCIUM: 8.5 mg/dL — AB (ref 8.9–10.3)
CO2: 26 mmol/L (ref 22–32)
CREATININE: 0.8 mg/dL (ref 0.61–1.24)
Chloride: 107 mmol/L (ref 101–111)
GLUCOSE: 106 mg/dL — AB (ref 65–99)
Potassium: 3.8 mmol/L (ref 3.5–5.1)
Sodium: 139 mmol/L (ref 135–145)

## 2016-09-20 LAB — CBC
HEMATOCRIT: 35.5 % — AB (ref 39.0–52.0)
Hemoglobin: 11.5 g/dL — ABNORMAL LOW (ref 13.0–17.0)
MCH: 29.5 pg (ref 26.0–34.0)
MCHC: 32.4 g/dL (ref 30.0–36.0)
MCV: 91 fL (ref 78.0–100.0)
PLATELETS: 187 10*3/uL (ref 150–400)
RBC: 3.9 MIL/uL — ABNORMAL LOW (ref 4.22–5.81)
RDW: 17.6 % — AB (ref 11.5–15.5)
WBC: 6.8 10*3/uL (ref 4.0–10.5)

## 2016-09-20 LAB — MAGNESIUM: Magnesium: 2.1 mg/dL (ref 1.7–2.4)

## 2016-09-20 MED ORDER — ENSURE ENLIVE PO LIQD
237.0000 mL | Freq: Two times a day (BID) | ORAL | Status: DC
  Administered 2016-09-21: 237 mL via ORAL

## 2016-09-20 MED ORDER — BOOST / RESOURCE BREEZE PO LIQD
1.0000 | Freq: Three times a day (TID) | ORAL | Status: DC
Start: 2016-09-20 — End: 2016-09-20

## 2016-09-20 MED ORDER — GUAIFENESIN 100 MG/5ML PO SOLN
5.0000 mL | Freq: Four times a day (QID) | ORAL | Status: DC
  Administered 2016-09-20 – 2016-09-21 (×5): 100 mg via ORAL
  Filled 2016-09-20 (×5): qty 10

## 2016-09-20 NOTE — Progress Notes (Signed)
Assessment unchanged agree with previous assessment. SRP, RN

## 2016-09-20 NOTE — Progress Notes (Signed)
Speech Language Pathology Discharge Patient Details Name: Eduardo Deleon MRN: [id number] DOB: [birth year] Today's Date: 09/20/2016    Patient discharged from SLP services secondary to goals met and no further SLP needs identified.  Please see latest therapy progress note for current level of functioning and progress toward goals.    Progress and discharge plan discussed with patient and/or caregiver: pt spoke to palliative MD regarding his chronic dysphagia  [place], [facility] SLP [phone]

## 2016-09-20 NOTE — Progress Notes (Signed)
PT Cancellation Note  Patient Details Name: CHOU BUSLER MRN: [id number] DOB: 11/07/1924   Cancelled Treatment:    Reason Eval/Treat Not Completed: Attempted PT eval-pt politely declined participation. Per chart, plan is for pt to return to SNF. Will defer PT eval to SNF, if necessary. Please reorder if pt needs to be seen prior to d/c.  Thanks.    Weston Anna, MPT Pager: [phone]

## 2016-09-20 NOTE — Discharge Instructions (Signed)
Colostomy Home Guide, Adult A colostomy is a surgical procedure to make an opening (stoma) for stool (feces) to leave your body. This surgery is done when a medical condition prevents stool from leaving your body through the end of the large intestine (rectum). During the surgery, part of the large intestine (colon) is attached to the stoma that is made in the front of your abdomen. A bag (pouch) is fitted over the stoma. Stool and gas will collect in the bag. After having this surgery, you will need to empty and change your colostomy bag as needed. You will also need to care for the stoma. How do I care for my stoma? Your stoma should look pink, red, and moist, like the inside of your cheek. At first, the stoma may be swollen, but this swelling will go away within 6 weeks. To care for the stoma:  Keep the skin around the stoma clean and dry.  Use a clean, soft washcloth to gently wash the stoma and the skin around it.  Use warm water and only use cleansers recommended by your health care provider.  Rinse the stoma area with plain water.  Dry the area well.  Use stoma powder or ointment on your skin only as told by your health care provider. Do not use any other powders, gels, wipes, or creams on your skin.  Change your colostomy bag if your skin becomes irritated. Irritation may indicate that the bag is leaking.  Check your stoma area every day for signs of infection. Check for:  More redness, swelling, or pain.  More fluid or blood.  Pus or warmth.  Measure the stoma opening regularly and record the size. Watch for changes. Share this information with your health care provider. How do I care for my colostomy bag? The bag that fits over the stoma can have either one or two pieces.  One-piece bag: The skin barrier and the bag are combined in a single unit.  Two-piece bag: The skin barrier and the bag are separate pieces that attach to each other. 1.  Empty your bag at bedtime and  whenever it is one-third to one-half full. Do not let more stool or gas build up. This could cause the bag to leak. Some colostomy bags have a built-in gas release valve. Change the bag every 3-4 days or as told by your health care provider. Also change the bag if it is leaking or separating from the skin or your skin looks irritated. How do I empty my colostomy bag? Before you leave the hospital, you will be taught how to empty your bag. Follow these basic steps: 1. Wash your hands with soap and water. 2. Sit far back on the toilet. 3. Put several pieces of toilet paper into the toilet water. This will prevent splashing as you empty the stool into the toilet. 4. Remove the clip or the velcro from the tail end of the bag. 5. Unroll the tail, then empty stool into the toilet. 6.  7. Reroll the tail, and close it with the clip or velcro. 8. Wash your hands again. How do I change my colostomy bag? Before you leave the hospital, you will be taught how to change your bag. Always have colostomy supplies with you, and follow these basic steps: 1. Wash your hands with soap and water. Have paper towels or tissues near you to clean any discharge. 2. Use a template to pre-cut the skin barrier. Smooth any rough edges. 3. If using  a two-piece bag, attach the bag and the skin barrier to each other. Add the barrier ring, if you use one. 4. If your stools are watery, add a few cotton balls to the new bag to absorb the liquid. 5. Remove the old bag and skin barrier. Gently push the skin away from the barrier with your fingers or a warm cloth. 6. Wash your hands again. Then clean the stoma area as directed with water or with mild soap and water. Use water to rinse away any soap. 7. Dry the skin. You may use the cool setting on a hair dryer to do this. 8. If directed, apply stoma powder or skin barrier gel to the skin. 9. Dry the skin again. 10. Warm the skin barrier with your hands or a warm  compress. 11. Remove the paper from the sticky (adhesive) strip of the skin barrier. 12. Press the adhesive strip onto the skin around the stoma. 13. Gently rub the skin barrier onto the skin. This creates heat that helps the barrier to stick. 14. Apply stoma tape to the edges of the skin barrier. What are some general tips?  Avoid wearing clothes that are tight directly over your stoma.  You may shower or bathe with the colostomy bag on or off. Do not use harsh or oily soaps or lotions. Dry the skin and bag after bathing.  Store all supplies in a cool, dry place. Do not leave supplies in extreme heat because parts can melt.  Whenever you leave home, take an extra skin barrier and colostomy bag with you.  If your colostomy bag gets wet, you can dry it with a hair dryer on the cool setting.  To prevent odor, put drops of ostomy deodorizer in the colostomy bag. Your health care provider may also recommend putting ostomy lubricant inside the bag. This helps the stool to slide out of the bag more easily and completely. Contact a health care provider if:  You have more redness, swelling, or pain around your stoma.  You have more fluid or blood coming from your stoma.  Your stoma feels warm to the touch.  You have pus coming from your stoma.  Your stoma extends in or out farther than normal.  You need to change the bag every day.  You have a fever. Get help right away if:  Your stool is bloody.  You vomit.  You have trouble breathing. This information is not intended to replace advice given to you by your health care provider. Make sure you discuss any questions you have with your health care provider. Document Released: 06/14/2003 Document Revised: 10/20/2015 Document Reviewed: 10/14/2013 Elsevier Interactive Patient Education  2017 [facility], [place] [phone]  OPEN ABDOMINAL SURGERY: POST OP INSTRUCTIONS  Always review your discharge  instruction sheet given to you by the facility where your surgery was performed.  IF YOU HAVE DISABILITY OR FAMILY LEAVE FORMS, YOU MUST BRING THEM TO THE OFFICE FOR PROCESSING.  PLEASE DO NOT GIVE THEM TO YOUR DOCTOR.  1. A prescription for pain medication may be given to you upon discharge.  Take your pain medication as prescribed, if needed.  If narcotic pain medicine is not needed, then you may take acetaminophen (Tylenol) or ibuprofen (Advil) as needed. 2. Take your usually prescribed medications unless otherwise directed. 3. If you need a refill on your pain medication, please contact your pharmacy. They will contact our office to request authorization.  Prescriptions will not be filled after 5pm or on week-ends. 4. You should follow a light diet the first few days after arrival home, such as soup and crackers, pudding, etc.unless your doctor has advised otherwise. A high-fiber, low fat diet can be resumed as tolerated.   Be sure to include lots of fluids daily. Most patients will experience some swelling and bruising on the chest and neck area.  Ice packs will help.  Swelling and bruising can take several days to resolve 5. Most patients will experience some swelling and bruising in the area of the incision. Ice pack will help. Swelling and bruising can take several days to resolve..  6. It is common to experience some constipation if taking pain medication after surgery.  Increasing fluid intake and taking a stool softener will usually help or prevent this problem from occurring.  A mild laxative (Milk of Magnesia or Miralax) should be taken according to package directions if there are no bowel movements after 48 hours. 7.  You may have steri-strips (small skin tapes) in place directly over the incision.  These strips should be left on the skin for 7-10 days.  If your surgeon used skin glue on the incision, you may shower in 24 hours.  The glue will flake off over the next 2-3 weeks.  Any sutures  or staples will be removed at the office during your follow-up visit. You may find that a light gauze bandage over your incision may keep your staples from being rubbed or pulled. You may shower and replace the bandage daily. 8. ACTIVITIES:  You may resume regular (light) daily activities beginning the next day--such as daily self-care, walking, climbing stairs--gradually increasing activities as tolerated.  You may have sexual intercourse when it is comfortable.  Refrain from any heavy lifting or straining until approved by your doctor. a. You may drive when you no longer are taking prescription pain medication, you can comfortably wear a seatbelt, and you can safely maneuver your car and apply brakes b. Return to Work: ___________________________________ 68. You should see your doctor in the office for a follow-up appointment approximately two weeks after your surgery.  Make sure that you call for this appointment within a day or two after you arrive home to insure a convenient appointment time. OTHER INSTRUCTIONS:  _____________________________________________________________ _____________________________________________________________  WHEN TO CALL YOUR DOCTOR: 1. Fever over 101.0 2. Inability to urinate 3. Nausea and/or vomiting 4. Extreme swelling or bruising 5. Continued bleeding from incision. 6. Increased pain, redness, or drainage from the incision. 7. Difficulty swallowing or breathing 8. Muscle cramping or spasms. 9. Numbness or tingling in hands or feet or around lips.  The clinic staff is available to answer your questions during regular business hours.  Please dont hesitate to call and ask to speak to one of the nurses if you have concerns.  For further questions, please visit [email or web address]

## 2016-09-20 NOTE — Progress Notes (Addendum)
Nutrition Follow-Up Note    DOCUMENTATION CODES:   Not applicable  INTERVENTION:   Boost Breeze po TID, each supplement provides 250 kcal and 9 grams of protein  NUTRITION DIAGNOSIS:   Inadequate oral intake related to acute illness as evidenced by other (see comment) (clear liquid diet).   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 He was seen by GI and had an unsuccessful attempt at decompression. Surgery evaluated the patient and patient now s/p sigmoid colectomy and colostomy on 09/13/2016.   Pt self extubated 3/27. Pt continues to do well today. Pt with history of CVA; SLP evaluated pt on 3/27 and recommended pt to be NPO. SLP reevaluated pt on 3/28 and approved pt for Nectar-thick liquids. Pt currently eating 100% of nectar thick clear liquid diet. RD spoke to RN about appropriate supplements for this pt; plan is to thicken Boost Breeze for now and advance to Ensure if patient is able. Good output from colostomy today. Pt with many weight fluctuations and weight loss since admit; RD unsure if this is related to pt's CHF as pt with edema. Plan is for patient to discharge to SNF. Pt requesting a more comfort approach and reports he would not want to be intubated again. Palliative care consult pending.    Medications reviewed and include: protonix  Labs reviewed: Ca 8.5(L) adj. 9.22 wnl, alb 3.1(L)  Diet Order:  Diet clear liquid Room service appropriate? Yes with Assist; Fluid consistency: Nectar Thick  Skin:  Wound (see comment) (Stage 2 nonhealing L ankle pressure injury)  Last BM:  3/29 (colostomy)  Height:   Ht Readings from Last 1  Encounters:  09/15/16 5\' 6"  (1.676 m)    Weight:   Wt Readings from Last 1 Encounters:  09/20/16 136 lb 11 oz (62 kg)    Ideal Body Weight:  64.54 kg  BMI:  Body mass index is 22.06 kg/m.  Estimated Nutritional Needs:   Kcal:  1600-1900kcal/day   Protein:  93-105g/day   Fluid:  >1.6L/day   EDUCATION NEEDS:   No education needs identified at this time  Koleen Distance, RD, LDN Pager #[phone]

## 2016-09-20 NOTE — Progress Notes (Signed)
Palliative medicine progress note  Reason for encounter: Goals of care in light of volvulus s/p colostomy, risk for recurrent aspiration  I met today with [name] and also discussed care plan and conversation I had with [name] with Eduardo Deleon, his godson/POA.  [name] is extremely hard of hearing and communication facilitated by my writing notes and him answering written questions verbally.  We reviewed his clinical course as well as risk associated with his compromised swallowing.  He reports understanding risk and that this is something that has also been discussed with his in the past.  He was actually able to recall prior conversations about risk in detail.  He is clear that he wants to be able to continue to direct his diet based on his preferences regardless of risk for aspiration events.    He wants to be able to get back to his wife as soon as it is safe for him to do so.  He is agreeable to palliative care following with his at SNF on discharge.  I also reviewed this conversation with his godson/POA Eduardo Deleon.  He reports that this is consistent with his prior conversations.  Eduardo Deleon agrees that [name] is clear in his wishes and fully capable of making his decisions.  I discussed with Dr. Posey Pronto as well as Tammy from speech.  Time in 0950 Time out 1110 Total face to face time: 75 minutes  [place], MD [place] Team [phone]

## 2016-09-20 NOTE — Progress Notes (Signed)
[facility] Progress Note  Patient: Eduardo Deleon YQM:[id number]   PCP: Pcp Not In System DOB: [birth year]   DOA: 09/09/2016   DOS: 09/20/2016   Date of Service: the patient was seen and examined on 09/20/2016  Subjective: discussed with pt with help of writing. Denies any acute pain, breathing is better, appetite is better as well. No nausea. Still remains confused about his orientation and location.  Brief hospital course: Pt. with PMH of atrial fibrillation, CVA, severe torticollis pH, dementia, prostate cancer, CHF, chronic aspiration; admitted on 09/09/2016, with complaint of abdominal pain and distention, was found to have sigmoid volvulus. He was seen by GI and had an unsuccessful attempt at decompression. Surgery evaluated the patient and patient underwent permanent colostomy. The patient was admitted to CCM. Currently further plan is monitor diet advance.  Assessment and Plan: 1. 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 Surgery was consulted. Currently has signed off. Still has staples, needs to be removed on 09/27/2016 Good output from the colostomy bag. No nausea or vomiting. Tolerating clear liquid diet, discussed with the surgery will advance to full liquid diet if tolerate that will advance to soft diet tomorrow.  2. Acute hypoxic respiratory failure. 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 Self extubated on 09/18/2016. Patient high risk for recurrent aspiration pneumonia, continues to have some bilateral rhonchi  check with chest x-ray. Continue incentive spirometry and pulmonary toilet.  3. A. fib with RVR. Cardiology was consulted. Currently rate controlled, we'll transition him to oral Cardizem. Also on heparin drip on Eliquis at home. Transition back to Eliquis.  5. Chronic diastolic CHF. Currently euvolemic monitor.  6. Chronic anemia. Likely iron deficiency. No active bleeding. Monitor  H&H.  7. Hypokalemia. Monitor and replace as needed.  8. History of CVA. On therapeutic anticoagulation.  9. Goals of care discussion. Discussed with patient he would not want to go back to ventilator or having any surgery. Discussed with POA who agrees with this recommendation. Patient is at high risk for recurrent aspiration pneumonia as well as recurrent hospitalization. I recommended continuing palliative care approach at the SNF along with therapy.  10. Mild confusion. ICU delirium, should get better once the patient is back to his normal environment. No evidence of agitation or other behavioral disturbances.  Diet: Full liquid, nectar thick DVT Prophylaxis: therapeutic anticoagulation  Advance goals of care discussion: DNR DNI, se above  Family Communication: no family was present at bedside, at the time of interview. Discussed with patient's POA on phone, Opportunity was given to ask question and all questions were answered satisfactorily.   Disposition:  Discharge to SNF. Expected discharge date: Friday,   Consultants: Gen. surgery, CCM, cardiology Procedures: Sigmoid colectomy and colostomy  Antibiotics: Anti-infectives    Start     Dose/Rate Route Frequency Ordered Stop   09/13/16 0946  cefoTEtan in Dextrose 5% (CEFOTAN) 2-2.08 GM-% IVPB    Comments:  Key, Kristopher   : cabinet override      09/13/16 0946 09/13/16 1102   09/13/16 0700  cefoTEtan in Dextrose 5% (CEFOTAN) IVPB 2 g     2 g Intravenous On call 09/12/16 1604 09/13/16 1102       Objective: Physical Exam: Vitals:   09/19/16 1529 09/19/16 2035 09/20/16 0452 09/20/16 1427  BP: (!) 102/53 122/63 (!) 107/56 98/72  Pulse: 78 76 70 86  Resp: (!) 22 20 (!) 22 18  Temp:  98.1 F (36.7 C) 98.2 F (36.8 C)  98.2 F (36.8 C)  TempSrc:  Oral Oral Oral  SpO2: 97% 97% 99% 98%  Weight:   62 kg (136 lb 11 oz)   Height:        Intake/Output Summary (Last 24 hours) at 09/20/16 1434 Last data filed at  09/20/16 1300  Gross per 24 hour  Intake              720 ml  Output              125 ml  Net              595 ml   Filed Weights   09/18/16 0500 09/19/16 0500 09/20/16 0452  Weight: 63.6 kg (140 lb 3.4 oz) 63.5 kg (139 lb 15.9 oz) 62 kg (136 lb 11 oz)   General: Alert, Awake and Oriented to Person. Appear in mild distress, affect appropriate Eyes: PERRL, Conjunctiva normal ENT: Oral Mucosa clear moist. Severe torticolis Neck: no JVD, no Abnormal Mass Or lumps Cardiovascular: S1 and S2 Present, no Murmur, Respiratory: Bilateral Air entry equal and Decreased, no use of accessory muscle, bilateral Crackles, no wheezes Abdomen: Bowel Sound present, Soft and no tenderness Skin: o redness, no Rash, no induration Extremities: no Pedal edema, no calf tenderness Neurologic: Grossly no focal neuro deficit. Bilaterally Equal motor strength  Data Reviewed: CBC:  Recent Labs Lab 09/16/16 0319 09/17/16 0336 09/18/16 0310 09/19/16 0312 09/20/16 0542  WBC 6.3 6.3 5.0 5.3 6.8  NEUTROABS  --   --  2.9 3.3  --   HGB 10.1* 10.8* 11.3* 11.2* 11.5*  HCT 30.0* 32.5* 33.9* 34.6* 35.5*  MCV 88.0 87.4 88.5 89.9 91.0  PLT 121* 150 158 185 017   Basic Metabolic Panel:  Recent Labs Lab 09/16/16 0319 09/16/16 1610 09/17/16 0336 09/18/16 0310 09/19/16 0312 09/20/16 0542  NA 141  --  139 136 139 139  K 3.6  --  3.2* 3.3* 3.7 3.8  CL 107  --  102 103 107 107  CO2 28  --  27 26 25 26   GLUCOSE 110*  --  103* 97 88 106*  BUN 13  --  17 22* 19 17  CREATININE 0.76  --  0.92 0.93 0.88 0.80  CALCIUM 7.9*  --  8.4* 8.5* 8.6* 8.5*  MG 2.1  --  2.0 2.0 2.2 2.1  PHOS 3.0 4.0 4.7* 4.7* 2.9  --     Liver Function Tests:  Recent Labs Lab 09/18/16 0310 09/19/16 0312  ALBUMIN 2.9* 3.1*   No results for input(s): LIPASE, AMYLASE in the last 168 hours. No results for input(s): AMMONIA in the last 168 hours. Coagulation Profile: No results for input(s): INR, PROTIME in the last 168  hours. Cardiac Enzymes: No results for input(s): CKTOTAL, CKMB, CKMBINDEX, TROPONINI in the last 168 hours. BNP (last 3 results) No results for input(s): PROBNP in the last 8760 hours. CBG:  Recent Labs Lab 09/19/16 2032 09/19/16 2309 09/20/16 0457 09/20/16 0757 09/20/16 1143  GLUCAP 89 92 102* 114* 94   Studies: No results found.  Scheduled Meds: . apixaban  5 mg Oral BID  . chlorhexidine  15 mL Mouth Rinse BID  . diltiazem  30 mg Oral Q8H  . feeding supplement  1 Container Oral TID BM  . guaiFENesin  5 mL Oral QID  . mouth rinse  15 mL Mouth Rinse q12n4p  . pantoprazole (PROTONIX) IV  40 mg Intravenous Q12H  . sodium chloride flush  3 mL  Intravenous Q12H   Continuous Infusions:  PRN Meds: metoprolol, RESOURCE THICKENUP CLEAR  Time spent: 35 minutes  Author: Berle Mull, MD [facility] Pager: [phone] 09/20/2016 2:34 PM  If 7PM-7AM, please contact night-coverage at [email or web address], password [facility]

## 2016-09-20 NOTE — Care Management Important Message (Signed)
Important Message  Patient Details  Name: Eduardo Deleon MRN: [id number] Date of Birth: [birth year]   Medicare Important Message Given:  Yes    Kerin Salen 09/20/2016, 9:45 AMImportant Message  Patient Details  Name: Eduardo Deleon MRN: [id number] Date of Birth: 1924/07/16   Medicare Important Message Given:  Yes    Kerin Salen 09/20/2016, 9:44 AM

## 2016-09-21 LAB — BASIC METABOLIC PANEL
Anion gap: 5 (ref 5–15)
BUN: 12 mg/dL (ref 6–20)
CALCIUM: 8.7 mg/dL — AB (ref 8.9–10.3)
CO2: 26 mmol/L (ref 22–32)
CREATININE: 0.8 mg/dL (ref 0.61–1.24)
Chloride: 107 mmol/L (ref 101–111)
GFR calc non Af Amer: >60 mL/min (ref >60)
GLUCOSE: 97 mg/dL (ref 65–99)
Potassium: 3.8 mmol/L (ref 3.5–5.1)
Sodium: 138 mmol/L (ref 135–145)

## 2016-09-21 LAB — CBC
HEMATOCRIT: 35.7 % — AB (ref 39.0–52.0)
Hemoglobin: 11.3 g/dL — ABNORMAL LOW (ref 13.0–17.0)
MCH: 28.1 pg (ref 26.0–34.0)
MCHC: 31.7 g/dL (ref 30.0–36.0)
MCV: 88.8 fL (ref 78.0–100.0)
Platelets: 223 10*3/uL (ref 150–400)
RBC: 4.02 MIL/uL — ABNORMAL LOW (ref 4.22–5.81)
RDW: 17.5 % — AB (ref 11.5–15.5)
WBC: 5.2 10*3/uL (ref 4.0–10.5)

## 2016-09-21 LAB — GLUCOSE, CAPILLARY
GLUCOSE-CAPILLARY: 82 mg/dL (ref 65–99)
Glucose-Capillary: 101 mg/dL — ABNORMAL HIGH (ref 65–99)
Glucose-Capillary: 101 mg/dL — ABNORMAL HIGH (ref 65–99)
Glucose-Capillary: 97 mg/dL (ref 65–99)

## 2016-09-21 MED ORDER — GUAIFENESIN 100 MG/5ML PO SOLN: 5.0000 mL | Freq: Four times a day (QID) | ORAL | 0 refills | Status: AC

## 2016-09-21 MED ORDER — TAMSULOSIN HCL 0.4 MG PO CAPS: 0.4000 mg | ORAL_CAPSULE | Freq: Every day | ORAL | 0 refills | Status: AC

## 2016-09-21 NOTE — Progress Notes (Signed)
RT instructed pt on how to do the flutter. Pt had poor effort and stated he has never had the flutter before. RT educated pt on the use of the flutter. Pt had a good understanding - but poor effort at this time

## 2016-09-21 NOTE — Discharge Summary (Signed)
[facility] Discharge Summary   Patient: Eduardo Deleon ZOX:[id number]   PCP: Pcp Not In System DOB: [birth year]   Date of admission: 09/09/2016   Date of discharge:  09/21/2016    Discharge Diagnoses:  Principal Problem:   SBO (small bowel obstruction) Active Problems:   GERD (gastroesophageal reflux disease)   Chronic systolic CHF (congestive heart failure) (HCC)   Anemia   Torticollis   Atrial fibrillation (HCC)   Volvulus (HCC)   Preoperative cardiovascular examination   S/P colon resection  Admitted From: SNF Disposition:  SNF  Recommendations for Outpatient Follow-up:  1. Please follow up with PCP and general surgery  2. Still has staples, needs to be removed on 09/27/2016 3. Continue palliative care follow up at the SNF, if condition deteriorates consider hospice and comfort care.   Follow-up Information    Gayland Curry, MD Follow up on 09/28/2016.   Specialty:  General Surgery Why:  staple removal by our nurses at 10 AM, be at the office 30 minutes early for check in.  Our office will set up a time for you to follow up with Dr. [name]. Contact information: [address] [place] [address] [phone]        PCP. Schedule an appointment as soon as possible for a visit in 1 week(s).          Diet recommendation: dysphagia 2 nector thick  Activity: The patient is advised to gradually reintroduce usual activities.  Discharge Condition: good  Code Status: DNR DNI  History of present illness: As per the H and P dictated on admission, "Patient poor historian-cannot really tell me what is going on other than to say someone came by and will "fix him" Called [facility]-spoke to nursing at facility.  This am ~ lunch time pain in abd-worst pain in the abd-no stool on trying to use RR.  Told RN he had a stool 3 days prio-tylenol given. He vomited undigested food and was sent to ED.  At baseline can get up with assist-can feed self.  Watches TV.   Communicates by writing.  Some confusion at times.has a nephew who is around.  He wheels himself around in manual WC  In the emergency room found her BUN/creatinine elevated 24/0.9 above baseline 11/0.9 LFTs normal lactic acid 1, hemoglobin 11 white count 9 platelet 140 CT abdomen and pelvis recurrent distal sigmoid colon volvulus resulting in upstream obstruction Chest x-ray stable cardiomegaly with right atelectasis and rotator cuff on right"  Hospital Course:  Summary of his active problems in the hospital is as following. 1. 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 Surgery was consulted. Currently has signed off. Still has staples, needs to be removed on 09/27/2016 Good output from the colostomy bag. No nausea or vomiting. Tolerating clear liquid diet, discussed with the surgery will advance to full liquid diet if tolerate that will advance to soft diet tomorrow.  2. Acute hypoxic respiratory failure. 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 Self extubated on 09/18/2016. Patient high risk for recurrent aspiration pneumonia, continues to have some bilateral rhonchi. Chest x ray shows no evidence of pneumonia at present Continue flutter device, mucinex, Continue incentive spirometry and pulmonary toilet.  3. A. fib with RVR. Cardiology was consulted. Currently rate controlled, we'll transition him to oral Cardizem. Also on heparin drip on Eliquis at home. Transition back to Eliquis.  5. Chronic diastolic CHF. Currently euvolemic monitor.  6. Chronic anemia. Likely iron  deficiency. No active bleeding. Monitor H&H.  7. Hypokalemia. Monitor and replace as needed.  8. History of CVA. On therapeutic anticoagulation.  9. Goals of care discussion. Discussed with patient he would not want to go back to ventilator or having any surgery. Discussed with POA who agrees with this recommendation. Patient is at high  risk for recurrent aspiration pneumonia as well as recurrent hospitalization. I recommended continuing palliative care approach at the SNF along with therapy.  10. Mild confusion. ICU delirium, should get better once the patient is back to his normal environment. No evidence of agitation or other behavioral disturbances.  All other chronic medical condition were stable during the hospitalization.  Patient was seen by physical therapy, who recommended SNF, which was arranged by Education officer, museum and case Freight forwarder. On the day of the discharge the patient's vitals were stable, and no other acute medical condition were reported by patient. the patient was felt safe to be discharge at SNF with therapy and palliative care.  Procedures and Results:  Sigmoid colectomy and colostomy   Consultations:  General surgery   Gastroenterology   Palliative care  CCM  DISCHARGE MEDICATION: Current Discharge Medication List    START taking these medications   Details  guaiFENesin (ROBITUSSIN) 100 MG/5ML SOLN Take 5 mLs (100 mg total) by mouth 4 (four) times daily. Qty: 1200 mL, Refills: 0    tamsulosin (FLOMAX) 0.4 MG CAPS capsule Take 1 capsule (0.4 mg total) by mouth daily after supper. Qty: 30 capsule, Refills: 0      CONTINUE these medications which have NOT CHANGED   Details  acetaminophen (TYLENOL) 325 MG tablet Take 650 mg by mouth every 6 (six) hours as needed for moderate pain.    apixaban (ELIQUIS) 5 MG TABS tablet Take 1 tablet (5 mg total) by mouth 2 (two) times daily.    cholecalciferol (VITAMIN D) 1000 UNITS tablet Take 1,000 Units by mouth daily.     diltiazem (CARDIZEM CD) 180 MG 24 hr capsule Take 1 capsule (180 mg total) by mouth daily. Qty: 30 capsule, Refills: 11    finasteride (PROSCAR) 5 MG tablet Take 5 mg by mouth daily.    furosemide (LASIX) 20 MG tablet Take 20 mg by mouth daily.    iron polysaccharides (FERREX 150) 150 MG capsule Take 1 capsule (150 mg total) by  mouth daily. Qty: 30 capsule, Refills: 5    magnesium oxide (MAG-OX) 400 MG tablet Take 400 mg by mouth daily.    Multiple Vitamins-Minerals (PRESERVISION AREDS 2 PO) Take 1 capsule by mouth 2 (two) times daily.    pantoprazole (PROTONIX) 40 MG tablet Take 1 tablet (40 mg total) by mouth daily.    polyethylene glycol (MIRALAX / GLYCOLAX) packet Take 17 g by mouth daily.    potassium chloride (KLOR-CON) 10 MEQ CR tablet Take 10 mEq by mouth daily.       STOP taking these medications     doxazosin (CARDURA) 2 MG tablet        No Known Allergies Discharge Instructions    DIET DYS 2    Complete by:  As directed    Fluid consistency:  Nectar Thick   Discharge instructions    Complete by:  As directed    It is important that you read following instructions as well as go over your medication list with RN to help you understand your care after this hospitalization.  Discharge Instructions: Please follow-up with PCP in one week  Please request your  primary care physician to go over all Hospital Tests and Procedure/Radiological results at the follow up,  Please get all Hospital records sent to your PCP by signing hospital release before you go home.   Do not take more than prescribed Pain, Sleep and Anxiety Medications. You were cared for by a hospitalist during your hospital stay. If you have any questions about your discharge medications or the care you received while you were in the hospital after you are discharged, you can call the unit and ask to speak with the hospitalist on call if the hospitalist that took care of you is not available.  Once you are discharged, your primary care physician will handle any further medical issues. Please note that NO REFILLS for any discharge medications will be authorized once you are discharged, as it is imperative that you return to your primary care physician (or establish a relationship with a primary care physician if you do not have one) for  your aftercare needs so that they can reassess your need for medications and monitor your lab values. You Must read complete instructions/literature along with all the possible adverse reactions/side effects for all the Medicines you take and that have been prescribed to you. Take any new Medicines after you have completely understood and accept all the possible adverse reactions/side effects. Wear Seat belts while driving.   Increase activity slowly    Complete by:  As directed      Discharge Exam: Filed Weights   09/19/16 0500 09/20/16 0452 09/21/16 0340  Weight: 63.5 kg (139 lb 15.9 oz) 62 kg (136 lb 11 oz) 61.1 kg (134 lb 11.2 oz)   Vitals:   09/21/16 0535 09/21/16 0804  BP: 112/85   Pulse: (!) 102 92  Resp: 18 16  Temp: 98.1 F (36.7 C)    General: Appear in no distress, no Rash; Oral Mucosa moist. Cardiovascular: S1 and S2 Present, no Murmur, no JVD Respiratory: Bilateral Air entry present and bilateral Crackles, no wheezes Abdomen: Bowel Sound [resent, Soft and no tenderness Extremities: no Pedal edema, no calf tenderness Neurology: Grossly no focal neuro deficit., severe torticollis present   The results of significant diagnostics from this hospitalization (including imaging, microbiology, ancillary and laboratory) are listed below for reference.    Significant Diagnostic Studies: Dg Chest 2 View  Result Date: 09/09/2016 CLINICAL DATA:  Productive cough for the past 2 weeks. EXAM: CHEST  2 VIEW COMPARISON:  08/13/2016. FINDINGS: Stable enlarged cardiac silhouette. Mild right basilar atelectasis. Clear left lung. Aortic arch calcification. Superior migration of the right humeral head and mild right AC joint spur formation. IMPRESSION: 1. Stable cardiomegaly 2. Mild right basilar atelectasis. 3. Evidence of a large chronic rotator cuff tear on the right. Electronically Signed   By: Claudie Revering M.D.   On: 09/09/2016 13:41   Ct Abdomen Pelvis W Contrast  Result Date:  09/09/2016 CLINICAL DATA:  Low abdominal pain. EXAM: CT ABDOMEN AND PELVIS WITH CONTRAST TECHNIQUE: Multidetector CT imaging of the abdomen and pelvis was performed using the standard protocol following bolus administration of intravenous contrast. CONTRAST:  ISOVUE-300 IOPAMIDOL (ISOVUE-300) INJECTION 61% COMPARISON:  CT abdomen pelvis 04/10/2016 FINDINGS: Lower chest: Bibasilar atelectasis, left greater than right. Hepatobiliary: No focal liver abnormality is seen. No gallstones, gallbladder wall thickening, or biliary dilatation. Pancreas:  Atrophic pancreas. 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 Adrenals/Urinary Tract: Adrenal glands are unremarkable. Kidneys are normal, without renal calculi, focal lesion,  or hydronephrosis. Bladder is unremarkable. Stable cysts in the left inferior renal pole. Stomach/Bowel: The stomach and small bowel and decompressed. There is recurrent volvulus of the distal portion of the sigmoid colon, resulting in dilation of the upstream colonic loops. The transverse caliber of the dilated sigmoid colon reaches 9.2 cm. Small amount of fluid stool is seen proximal to the level of the volvulus. Vascular/Lymphatic: Aortic atherosclerosis. No enlarged abdominal or pelvic lymph nodes. Reproductive: The prostate gland is mildly enlarged. Other: Fluid containing right indirect inguinal hernia noted. Musculoskeletal: Multilevel degenerative disc disease ; S shaped thoraco lumbar spine scoliosis. IMPRESSION: Recurrent distal sigmoid colon volvulus resulting in upstream colonic obstruction. The small bowel is decompressed. These results were called by telephone at the time of interpretation on 09/09/2016 at 3:35 pm to [facility] [place] , who verbally acknowledged these results. Electronically Signed   By: Fidela Salisbury M.D.   On: 09/09/2016 15:37   Dg Chest Port 1 View  Result Date: 09/19/2016 CLINICAL DATA:   Cough EXAM: PORTABLE CHEST 1 VIEW COMPARISON:  09/18/2016 FINDINGS: Stable cardiomegaly. No pneumonic consolidation nor overt pulmonary edema. No effusion. Lung apices are obscured by the patient's head and neck, similar to prior. No acute appearing osseous abnormality. IMPRESSION: No active disease. Electronically Signed   By: Ashley Royalty M.D.   On: 09/19/2016 13:56   Dg Chest Port 1 View  Result Date: 09/18/2016 CLINICAL DATA:  Respiratory failure EXAM: PORTABLE CHEST 1 VIEW COMPARISON:  09/17/2016 FINDINGS: Cardiac shadow is mildly enlarged but stable. The upper portion of the exam is somewhat limited due to the overlying neck and skull. The lungs are well aerated without focal infiltrate. The nasogastric catheter and endotracheal tube have been removed in the interval. IMPRESSION: No acute abnormality noted. Electronically Signed   By: Inez Catalina M.D.   On: 09/18/2016 07:05   Dg Chest Port 1 View  Result Date: 09/17/2016 CLINICAL DATA:  Acute respiratory failure. Atrial fibrillation, CHF, nonsmoker. EXAM: PORTABLE CHEST 1 VIEW COMPARISON:  Portable chest x-ray of September 16, 2016 FINDINGS: The lungs are mildly hypoinflated. The endotracheal tube tip lies 2.7 cm above the carina. The esophagogastric tube tip lies in the proximal gastric body. Bibasilar densities have improved somewhat. The cardiac silhouette remains enlarged. The pulmonary vascularity is not engorged. IMPRESSION: Mild hypoinflation. Improving bibasilar atelectasis or pneumonia. No pulmonary edema or significant pleural effusion. The support tubes are in reasonable position. Electronically Signed   By: David  Martinique M.D.   On: 09/17/2016 07:03   Dg Chest Port 1 View  Result Date: 09/16/2016 CLINICAL DATA:  Patient with acute respiratory failure. History of CHF. EXAM: PORTABLE CHEST 1 VIEW COMPARISON:  Chest radiograph 09/15/2016. FINDINGS: ET tube terminates in the mid trachea. Enteric tube tip and side-port project in the stomach.  Stable cardiomegaly. Soft tissues overlie the upper thorax, limiting evaluation. Persistent heterogeneous opacities mid lower lungs bilaterally. Small bilateral pleural effusions. IMPRESSION: ET tube terminates in the mid trachea. Persistent mid lower lung heterogeneous opacities. Considerations include pneumonia or atelectasis. Small bilateral pleural effusions. Electronically Signed   By: Lovey Newcomer M.D.   On: 09/16/2016 07:09   Dg Chest Port 1 View  Result Date: 09/15/2016 CLINICAL DATA:  Hypoxia EXAM: PORTABLE CHEST 1 VIEW COMPARISON:  September 14, 2016 FINDINGS: Endotracheal tube tip is 4.3 cm above the carina. Nasogastric tube tip and side port are in the stomach. There is no pneumothorax. There are small pleural effusions bilaterally. Small right effusion appears new. There  is atelectasis with suspected mild consolidation in the left base. Heart remains enlarged with pulmonary vascular within normal limits. No adenopathy. No bone lesions. There is aortic atherosclerosis. IMPRESSION: New small right pleural effusion all. Small left pleural effusion. There is left base atelectasis with questionable superimposed consolidation, likely early pneumonia. Stable cardiac enlargement. Tube positions as described without pneumothorax. There is aortic atherosclerosis. Electronically Signed   By: Lowella Grip III M.D.   On: 09/15/2016 08:00   Dg Chest Port 1 View  Result Date: 09/14/2016 CLINICAL DATA:  Hypoxia EXAM: PORTABLE CHEST 1 VIEW COMPARISON:  September 13, 2016 FINDINGS: Endotracheal tube tip is 1.3 cm above the carina. Nasogastric tube tip and side port are below the diaphragm with the side port seen within the stomach region. No pneumothorax. There is pleural effusion on the left with left lower lobe atelectasis. There may be a degree of superimposed pneumonia in the left base. Lungs elsewhere are clear. There is cardiomegaly with pulmonary vascularity within normal limits. No evident adenopathy. No bone  lesions. There is atherosclerotic calcification in the aorta. IMPRESSION: Tube and catheter positions as described without pneumothorax. Advise withdrawing endotracheal tube approximately 3 cm. Atelectasis and possible superimposed pneumonia left base with left pleural effusion, stable. Right lung clear. Stable cardiomegaly. There is aortic atherosclerosis. Note that in comparison with prior study, there has been resolution of subcutaneous emphysema. Electronically Signed   By: Lowella Grip III M.D.   On: 09/14/2016 07:12   Portable Chest Xray  Result Date: 09/13/2016 CLINICAL DATA:  Intubated. EXAM: PORTABLE CHEST 1 VIEW COMPARISON:  09/09/2016. FINDINGS: Endotracheal tube tip at the carina. Interval nasogastric tube extending into the stomach. Stable enlarged cardiac silhouette. Interval patchy opacity at the left lung base and in the right perihilar region and medial right lung base. Interval bilateral subcutaneous emphysema. No pneumothorax or pneumoperitoneum seen. Diffuse osteopenia with stable evidence of a chronic rotator cuff tear on the right. IMPRESSION: 1. Endotracheal tube tip at the carina. It is recommended that this be retracted 4.5 cm. 2. Interval dense left basilar atelectasis or pneumonia and right perihilar and medial right lung base probable pneumonia. These results will be called to the ordering clinician or representative by the Radiologist Assistant, and communication documented in the PACS or zVision Dashboard. Electronically Signed   By: Claudie Revering M.D.   On: 09/13/2016 16:28   Dg Abd 2 Views  Result Date: 09/12/2016 CLINICAL DATA:  Nausea, vomiting and abdominal pain. Recurrent volvulus. EXAM: ABDOMEN - 2 VIEW COMPARISON:  09/11/2016 and CT 09/10/2015 FINDINGS: Again noted is a large gas-filled loop of bowel in the right abdomen that is most compatible with dilated sigmoid colon. No significant gas in the rectum. Bowel gas pattern remains compatible with a sigmoid volvulus  and similar to the previous examination. No evidence for free air on the left lateral decubitus image. Again noted is levoscoliosis of the thoracolumbar spine. Multiple phleboliths in the pelvis. IMPRESSION: Distended bowel in the abdomen and the configuration may remains compatible with a sigmoid volvulus. Minimal change from the recent comparison examination. No evidence for free air. Electronically Signed   By: Markus Daft M.D.   On: 09/12/2016 10:39   Dg Abd Portable 1v  Result Date: 09/11/2016 CLINICAL DATA:  83 year old male with abdominal distention. Subsequent encounter. EXAM: PORTABLE ABDOMEN - 1 VIEW COMPARISON:  09/10/2016 plain film exam.  09/09/2016 CT. FINDINGS: Persistent abnormal bowel gas pattern with gas distended (measuring up to 9 cm as on prior exam)  superiorly extending sigmoid colon which may reflect changes of sigmoid volvulus. The possibility of free intraperitoneal air cannot be assessed on a supine view. Scoliosis convex left.  Hip degenerative change greater on left. IMPRESSION: Findings consistent with sigmoid volvulus similar to prior exam as noted above. Electronically Signed   By: Genia Del M.D.   On: 09/11/2016 09:46   Dg Abd Portable 1v  Result Date: 09/10/2016 CLINICAL DATA:  Sigmoid volvulus EXAM: PORTABLE ABDOMEN - 1 VIEW COMPARISON:  09/09/2016 FINDINGS: Persistent gaseous distension of the sigmoid colon with paucity of bowel gas within pelvis highly suspicious for recurrent sigmoid colon volvulus. IMPRESSION: Persistent gaseous distension of the sigmoid colon with paucity of bowel gas within pelvis highly suspicious for recurrent sigmoid colon volvulus. Electronically Signed   By: Lahoma Crocker M.D.   On: 09/10/2016 17:25    Microbiology: Recent Results (from the past 240 hour(s))  MRSA PCR Screening     Status: None   Collection Time: 09/18/16  9:35 AM  Result Value Ref Range Status   MRSA by PCR NEGATIVE NEGATIVE Final    Comment:        The GeneXpert MRSA  Assay (FDA approved for NASAL specimens only), is one component of a comprehensive MRSA colonization surveillance program. It is not intended to diagnose MRSA infection nor to guide or monitor treatment for MRSA infections.      Labs: CBC:  Recent Labs Lab 09/17/16 0336 09/18/16 0310 09/19/16 0312 09/20/16 0542 09/21/16 0522  WBC 6.3 5.0 5.3 6.8 5.2  NEUTROABS  --  2.9 3.3  --   --   HGB 10.8* 11.3* 11.2* 11.5* 11.3*  HCT 32.5* 33.9* 34.6* 35.5* 35.7*  MCV 87.4 88.5 89.9 91.0 88.8  PLT 150 158 185 187 604   Basic Metabolic Panel:  Recent Labs Lab 09/16/16 0319 09/16/16 1610 09/17/16 0336 09/18/16 0310 09/19/16 0312 09/20/16 0542 09/21/16 0522  NA 141  --  139 136 139 139 138  K 3.6  --  3.2* 3.3* 3.7 3.8 3.8  CL 107  --  102 103 107 107 107  CO2 28  --  27 26 25 26 26   GLUCOSE 110*  --  103* 97 88 106* 97  BUN 13  --  17 22* 19 17 12   CREATININE 0.76  --  0.92 0.93 0.88 0.80 0.80  CALCIUM 7.9*  --  8.4* 8.5* 8.6* 8.5* 8.7*  MG 2.1  --  2.0 2.0 2.2 2.1  --   PHOS 3.0 4.0 4.7* 4.7* 2.9  --   --    Liver Function Tests:  Recent Labs Lab 09/18/16 0310 09/19/16 0312  ALBUMIN 2.9* 3.1*   No results for input(s): LIPASE, AMYLASE in the last 168 hours. No results for input(s): AMMONIA in the last 168 hours. Cardiac Enzymes: No results for input(s): CKTOTAL, CKMB, CKMBINDEX, TROPONINI in the last 168 hours. BNP (last 3 results) No results for input(s): BNP in the last 8760 hours. CBG:  Recent Labs Lab 09/20/16 1627 09/20/16 2003 09/21/16 0014 09/21/16 0344 09/21/16 0753  GLUCAP 93 91 97 82 101*   Time spent: 30 minutes  Signed:  Berle Mull  [facility]  09/21/2016  , 9:47 AM

## 2016-09-21 NOTE — Progress Notes (Signed)
Pt ready to return to [facility] today. Pt / nephew are in agreement with d/c back to SNF today. [facility] transport required. Medical necessity form completed. Family is aware out of pocket costs may be associated with [facility] transport. No scripts printed. D/C Summary sent to SNF for review. # for report provided to nsg.  Werner Lean LCSW [phone]

## 2017-02-13 NOTE — Addendum Note (Signed)
Addendum  created 02/13/17 1344 by Albertha Ghee, MD   Sign clinical note

## 2018-07-27 ENCOUNTER — Encounter (HOSPITAL_COMMUNITY): Payer: Self-pay

## 2018-07-27 ENCOUNTER — Emergency Department (HOSPITAL_COMMUNITY)
Admission: EM | Admit: 2018-07-27 | Discharge: 2018-07-27 | Disposition: A | Discharge status: Home / Self Care | Payer: Medicare Other | Attending: Emergency Medicine | Admitting: Emergency Medicine

## 2018-07-27 DIAGNOSIS — Z8546 Personal history of malignant neoplasm of prostate: Secondary | ICD-10-CM | POA: Diagnosis not present

## 2018-07-27 DIAGNOSIS — Z7901 Long term (current) use of anticoagulants: Secondary | ICD-10-CM | POA: Insufficient documentation

## 2018-07-27 DIAGNOSIS — Z96652 Presence of left artificial knee joint: Secondary | ICD-10-CM | POA: Diagnosis not present

## 2018-07-27 DIAGNOSIS — Z96651 Presence of right artificial knee joint: Secondary | ICD-10-CM | POA: Insufficient documentation

## 2018-07-27 DIAGNOSIS — F0390 Unspecified dementia without behavioral disturbance: Secondary | ICD-10-CM | POA: Insufficient documentation

## 2018-07-27 DIAGNOSIS — R040 Epistaxis: Secondary | ICD-10-CM | POA: Diagnosis present

## 2018-07-27 DIAGNOSIS — Z79899 Other long term (current) drug therapy: Secondary | ICD-10-CM | POA: Diagnosis not present

## 2018-07-27 DIAGNOSIS — Z8673 Personal history of transient ischemic attack (TIA), and cerebral infarction without residual deficits: Secondary | ICD-10-CM | POA: Insufficient documentation

## 2018-07-27 MED ORDER — SILVER NITRATE-POT NITRATE 75-25 % EX MISC
1.0000 "application " | Freq: Once | CUTANEOUS | Status: AC
  Administered 2018-07-27: 1 via TOPICAL
  Filled 2018-07-27: qty 1

## 2018-07-27 NOTE — ED Notes (Addendum)
[facility] called for transport.  

## 2018-07-27 NOTE — ED Notes (Signed)
RN cleaned patient. Patient had lots of blood on his chest and was using his gown to wipe his nose. RN did a gown change and washed patients face, chest, and neck. Patient reports he is more comfortable.

## 2018-07-27 NOTE — ED Notes (Signed)
Bed: YL69 Expected date:  Expected time:  Means of arrival:  Comments: 85 yo epistaxis

## 2018-07-27 NOTE — ED Provider Notes (Signed)
[place] DEPT Provider Note   CSN: [id number] Arrival date & time: 07/27/18  9629     History   Chief Complaint Chief Complaint  Patient presents with  . Epistaxis    HPI Eduardo Deleon is a 84 y.o. male.  HPI   Patient presents from his nursing care facility for epistaxis.  He is anticoagulated with Eliquis for atrial fibrillation.  Apparently symptomatic measures have not been helpful to stop the nosebleeding.  Patient has dementia and cannot contribute to history.  Also of note he has a MOST form which specifies no aggressive treatments.  Level 5 caveat-dementia  Past Medical History:  Diagnosis Date  . Anal fissure    Hx of  . Anemia   . Atrial fibrillation (HCC)    chronic anticaog - we[place]raub  . Congestive heart failure ([place])   . CRI (chronic renal insufficiency)   . Diverticulosis 04/12/1998   Left colon--Flex Sig  . GERD (gastroesophageal reflux disease)   . Internal hemorrhoids   . Osteoarthritis   . Prostate cancer ([place])   . PVD (peripheral vascular disease) ([place])   . 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 Splenic lesion   . Stroke [facility]) 1978 lower brain stem  . Torticollis, acquired   . Tubular adenoma   . Venous stasis dermatitis    hx venous ulcer/cellulitis 06/2011 R and 04/2011 L    Patient Active Problem List   Diagnosis Date Noted  . S/P colon resection 09/13/2016  . Preoperative cardiovascular examination   . SBO (small bowel obstruction) ([place])   . Abdominal pain   . Abnormal CT of the abdomen   . Abdominal distension   . Generalized abdominal pain   . Pressure injury of skin 04/11/2016  . Hypokalemia   . Volvulus ([place]) 04/10/2016  . Sigmoid volvulus ([place])   . Venous stasis dermatitis of both lower extremities 05/26/2015  . Non-compliant behavior 04/27/2014  . Acute respiratory failure ([place]) 03/29/2014  . Prostate CA ([place]) 10/11/2013  . Elevated brain natriuretic peptide (BNP) level  10/11/2013  . Altered mental status 10/08/2013  . Atrial fibrillation ([place]) 11/21/2012  . Long term current use of anticoagulant therapy 11/21/2012  . Heme positive stool 06/29/2011  . Lower extremity venous stasis 06/28/2011  . Torticollis 06/28/2011  . CRI (chronic renal insufficiency) 06/28/2011  . Anemia 05/20/2011  . GERD (gastroesophageal reflux disease) 05/19/2011  . BPH (benign prostatic hyperplasia) 05/19/2011  . Chronic systolic CHF (congestive heart failure) ([place]) 05/19/2011    Past Surgical History:  Procedure Laterality Date  . COLON RESECTION SIGMOID N/A 09/13/2016   Procedure: LAPAROSCOPIC ASSISTED COLON RESECTION SIGMOID WITH COLOSTOMY;  Surgeon: Greer Pickerel, MD;  Location: [facility];  Service: General;  Laterality: N/A;  . [place]  . DIRECT LARYNGOSCOPY N/A 03/31/2014   Procedure: DIRECT LARYNGOSCOPY/EXAM UNDER ANESTHESIA;  Surgeon: [name], MD;  Location: [place];  Service: ENT;  Laterality: N/A;  . FLEXIBLE SIGMOIDOSCOPY N/A 04/10/2016   Procedure: FLEXIBLE SIGMOIDOSCOPY;  Surgeon: Jerene Bears, MD;  Location: [facility] END[facility]COPY;  Service: Endoscopy;  Laterality: N/A;  . FLEXIBLE SIGMOIDOSCOPY N/A 08/13/2016   Procedure: FLEXIBLE SIGMOIDOSCOPY;  Surgeon: Doran Stabler, MD;  Location: [facility];  Service: Gastroenterology;  Laterality: N/A;  . FLEXIBLE SIGMOIDOSCOPY N/A 09/09/2016   Procedure: FLEXIBLE SIGMOIDOSCOPY;  Surgeon: Carol Ada, MD;  Location: [facility];  Service: Endoscopy;  Laterality: N/A;  . HEMORRHOID SURGERY    .  HERNIA REPAIR     left-1982, right - 1985  . JOINT REPLACEMENT  bilaterial knees  . LUMBAR LAMINECTOMY  Per patient  heriated disc in mid spine  . SPIGELIAN HERNIA  09/13/2016   Procedure: SPIGELIAN HERNIA;  Surgeon: Greer Pickerel, MD;  Location: [facility];  Service: General;;  . TONSILLECTOMY          Home Medications    Prior to Admission medications   Medication Sig Start Date End Date Taking? Authorizing Provider    acetaminophen (TYLENOL) 325 MG tablet Take 650 mg by mouth every 6 (six) hours as needed for moderate pain.   Yes [provider]  apixaban (ELIQUIS) 5 MG TABS tablet Take 1 tablet (5 mg total) by mouth 2 (two) times daily. 04/12/16  Yes Barton Dubois, MD  diltiazem (CARDIZEM CD) 180 MG 24 hr capsule Take 1 capsule (180 mg total) by mouth daily. 04/07/13  Yes Lorretta Harp, MD  ergocalciferol (VITAMIN D2) 1.25 MG (50000 UT) capsule Take 50,000 Units by mouth every 30 (thirty) days.   Yes [provider]  finasteride (PROSCAR) 5 MG tablet Take 5 mg by mouth daily.   Yes [provider]  furosemide (LASIX) 20 MG tablet Take 20 mg by mouth daily.   Yes [provider]  guaiFENesin (ROBITUSSIN) 100 MG/5ML SOLN Take 5 mLs (100 mg total) by mouth 4 (four) times daily. Patient taking differently: Take 5 mLs by mouth 3 (three) times daily.  09/21/16  Yes Lavina Hamman, MD  iron polysaccharides (FERREX 150) 150 MG capsule Take 1 capsule (150 mg total) by mouth daily. 09/25/13  Yes Troy Sine, MD  L-Arginine 1000 MG TABS Take 1,000 mg by mouth daily.   Yes [provider]  magnesium oxide (MAG-OX) 400 MG tablet Take 400 mg by mouth daily.   Yes [provider]  Multiple Vitamin (MULTIVITAMIN WITH MINERALS) TABS tablet Take 1 tablet by mouth daily.   Yes [provider]  Multiple Vitamins-Minerals (PRESERVISION AREDS 2 PO) Take 1 capsule by mouth 2 (two) times daily.   Yes [provider]  oxymetazoline (AFRIN) 0.05 % nasal spray Place 1 spray into both nostrils 2 (two) times daily as needed (Nose bleed).   Yes [provider]  pantoprazole (PROTONIX) 40 MG tablet Take 1 tablet (40 mg total) by mouth daily. 04/13/14  Yes Charlynne Cousins, MD  polyethylene glycol [employer] [facility] Floria Raveling) packet Take 17 g by mouth daily. 04/12/16  Yes Barton Dubois, MD  potassium chloride (KLOR-CON) 10 MEQ CR tablet Take 10 mEq by mouth  daily.    Yes [provider]  tamsulosin (FLOMAX) 0.4 MG CAPS capsule Take 1 capsule (0.4 mg total) by mouth daily after supper. 09/21/16  Yes Lavina Hamman, MD  vitamin C (ASCORBIC ACID) 500 MG tablet Take 500 mg by mouth daily.   Yes [provider]    Family History Family History  Problem Relation Age of Onset  . Heart disease Father   . Breast cancer Mother   . Diabetes Brother   . Diabetes Other        grandmother  . Colon cancer Neg Hx     Social History Social History   Tobacco Use  . Smoking status: Never Smoker  . Smokeless tobacco: Never Used  . Tobacco comment: married, wife in [place] since 2013  Substance Use Topics  . Alcohol use: No    Alcohol/week: 0.0 standard drinks  . Drug use:  No     Allergies   Patient has no known allergies.   Review of Systems Review of Systems  Unable to perform ROS: Dementia     Physical Exam Updated Vital Signs BP 101/68   Pulse 80   Temp 98.2 F (36.8 C) (Oral)   Resp (!) 21   SpO2 95%   Physical Exam Vitals signs and nursing note reviewed.  Constitutional:      Appearance: He is well-developed. He is not ill-appearing or diaphoretic.     Comments: Elderly, frail  HENT:     Head: Normocephalic and atraumatic.     Right Ear: External ear normal.     Left Ear: External ear normal.     Nose:     Comments: On initial evaluation very small amount of bleeding in the left nares.  This was cleared with a Q-tip and no rebleeding was seen.  No bleeding from the mouth, on initial evaluation.    Mouth/Throat:     Mouth: Mucous membranes are dry.  Eyes:     Conjunctiva/sclera: Conjunctivae normal.     Pupils: Pupils are equal, round, and reactive to light.  Neck:     Musculoskeletal: Normal range of motion and neck supple.     Trachea: Phonation normal.  Cardiovascular:     Rate and Rhythm: Normal rate.  Pulmonary:     Effort: Pulmonary effort is normal.  Musculoskeletal:     Comments: Rigid  deformity of the neck, with left lateral head bending.  Skin:    General: Skin is warm and dry.  Neurological:     Mental Status: He is alert.     Cranial Nerves: No cranial nerve deficit.     Sensory: No sensory deficit.     Motor: No abnormal muscle tone.     Coordination: Coordination normal.  Psychiatric:        Behavior: Behavior normal.      ED Treatments / Results  Labs (all labs ordered are listed, but only abnormal results are displayed) Labs Reviewed - No data to display  EKG None  Radiology No results found.  Procedures .Epistaxis Management Date/Time: 07/27/2018 10:04 AM Performed by: Daleen Bo, MD Authorized by: Daleen Bo, MD   Consent:    Consent obtained:  Emergent situation Anesthesia (see MAR for exact dosages):    Anesthesia method:  None Procedure details:    Treatment site:  L posterior and L anterior   Treatment method:  Nasal balloon   Treatment complexity:  Limited   Treatment episode: initial   Post-procedure details:    Assessment:  Bleeding decreased   Patient tolerance of procedure:  Tolerated well, no immediate complications Comments:     Balloon inflated to 8 cc, with air   (including critical care time)  Medications Ordered in ED Medications  silver nitrate applicators applicator 1 application (1 application Topical Given by Other 07/27/18 1000)     Initial Impression / Assessment and Plan / ED Course  I have reviewed the triage vital signs and the nursing notes.  Pertinent labs & imaging results that were available during my care of the patient were reviewed by me and considered in my medical decision making (see chart for details).  Clinical Course as of Jul 27 1338  Sun Jul 27, 2018  1004 Reevaluation this time indicates that the patient has began to have active bleeding from the left nares and is coughing blood clots and spitting them out.  This requires aggressive  treatment of nosebleeding.   [EW]    Clinical  Course User Index [EW] Daleen Bo, MD     Patient Vitals for the past 24 hrs:  BP Temp Temp src Pulse Resp SpO2  07/27/18 0930 101/68 - - 80 (!) 21 95 %  07/27/18 0849 105/68 98.2 F (36.8 C) Oral 87 15 93 %  07/27/18 0836 - - - - - 97 %    1:38 PM Reevaluation with update and discussion. After initial assessment and treatment, an updated evaluation reveals bleeding remains controlled after observation for 2 hours following insertion of nasal balloon. Daleen Bo   Medical Decision Making: Epistaxis, left side, nonspecific.  Bleeding controlled with nasal balloon.  No occasion for further evaluation or treatment at this time.  CRITICAL CARE-no Performed by: Daleen Bo  Nursing Notes Reviewed/ Care Coordinated Applicable Imaging Reviewed Interpretation of Laboratory Data incorporated into ED treatment  The patient appears reasonably screened and/or stabilized for discharge and I doubt any other medical condition or other [facility] requiring further screening, evaluation, or treatment in the ED at this time prior to discharge.  Plan: Home Medications-continue current medications except hold Eliquis for 2 days.; Home Treatments-care for nasal balloon; return here if the recommended treatment, does not improve the symptoms; Recommended follow up-PCP, PRN.  Remove balloon in 3 or 4 days.  Final Clinical Impressions(s) / ED Diagnoses   Final diagnoses:  Epistaxis    ED Discharge Orders    None       Daleen Bo, MD 07/27/18 1340

## 2018-07-27 NOTE — ED Triage Notes (Addendum)
Patient arrived via [facility] from [facility].   Patient had a nose bleed between 5:00 and 5:30am this morning. Staff gave afrin that did not help. Nose bleed stopped at 6:45 for about 15 min then started bleeding again worse.  Bleeding is significantly slow now at this time per ems.  EMS states they were on scene for an hour trying to get the nose bleed to stop.   Nursing home is concerned because the epistaxis has become more frequent and states patient is not picking at his nose.   Disoriented to time.  Hx. Dementia, AFIB  DNR and most form at bedside.

## 2018-07-27 NOTE — Discharge Instructions (Addendum)
Do not take the Eliquis, until 07/30/2018.  Have your doctor remove the balloon from your nose by withdrawing 8 cm of air, in 3 or 4 days.  Return here, as needed, for problems.

## 2018-07-27 NOTE — ED Notes (Signed)
Condom cath has been placed and suction at bedside.

## 2019-01-14 IMAGING — CT CT ABD-PELV W/ CM
2 of 5 series · 16 of 46 positions shown, 18 images · IV contrast (ISOVUE)
Comparison: 04/10/2016

CLINICAL DATA: Lower abdominal pain.

EXAM:
CT ABDOMEN AND PELVIS WITH CONTRAST
TECHNIQUE: Multidetector CT imaging of the abdomen and pelvis was performed
using the standard protocol following bolus administration of
intravenous contrast.
CONTRAST:  80 mL [id number]

[Series 2: abd/pel with · axial · 0.81mm/px · z∈[+1153,+1528]mm · 13 of 89 slices shown, 15 images]
[im 7/89  soft-tissue]
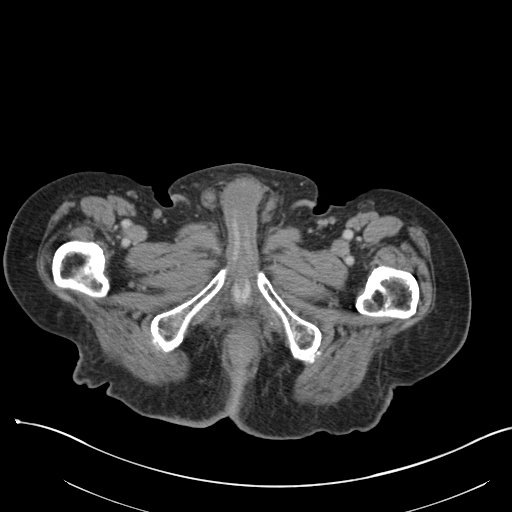
[im 7/89  bone]
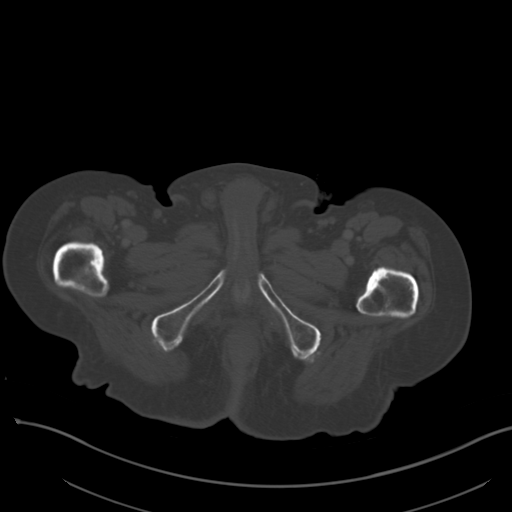
[im 13/89  soft-tissue]
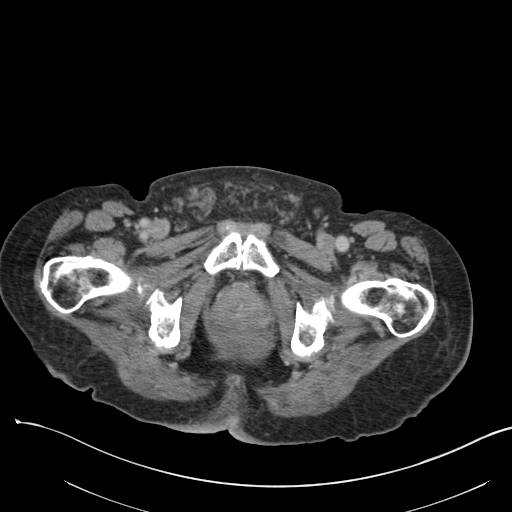
[im 19/89  soft-tissue]
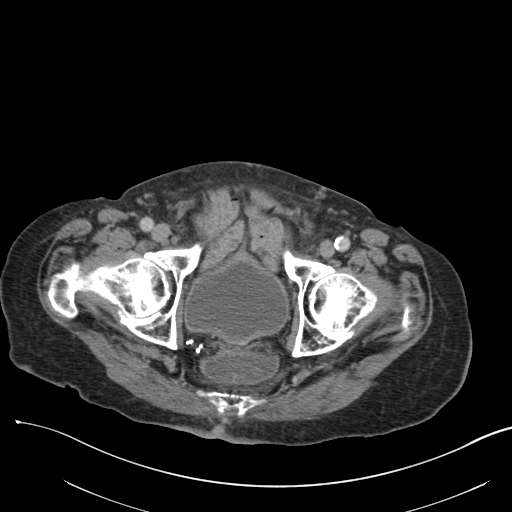
[im 26/89  soft-tissue]
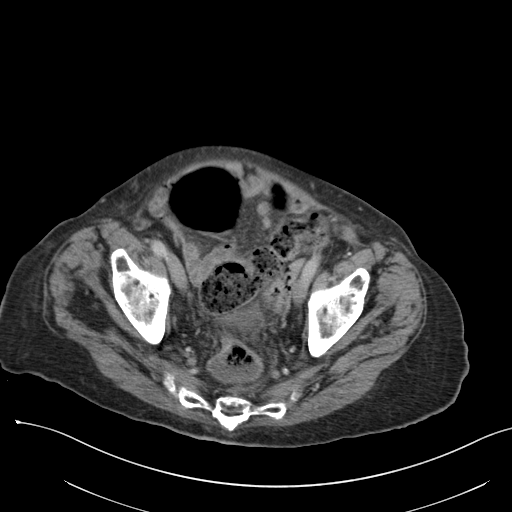
[im 32/89  soft-tissue]
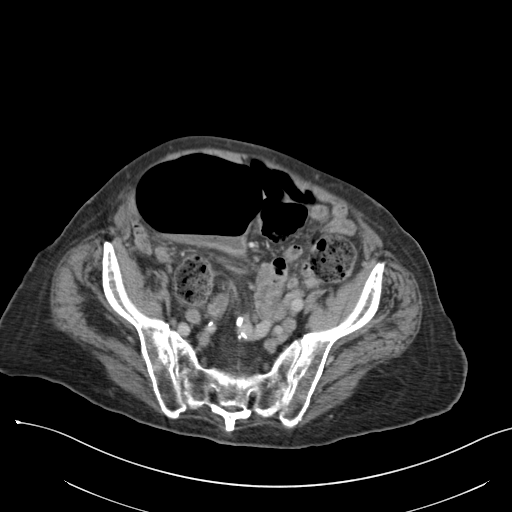
[im 38/89  soft-tissue]
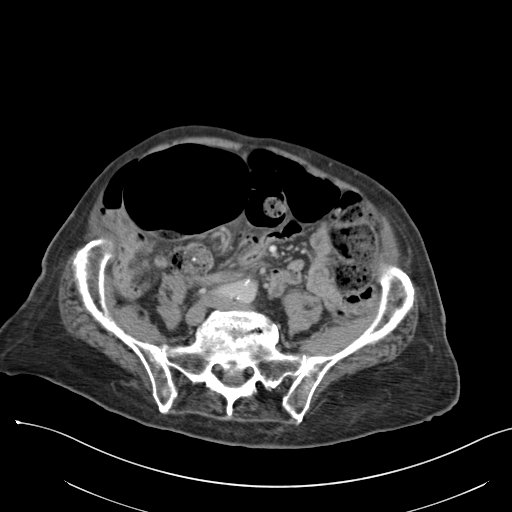
[im 45/89  soft-tissue]
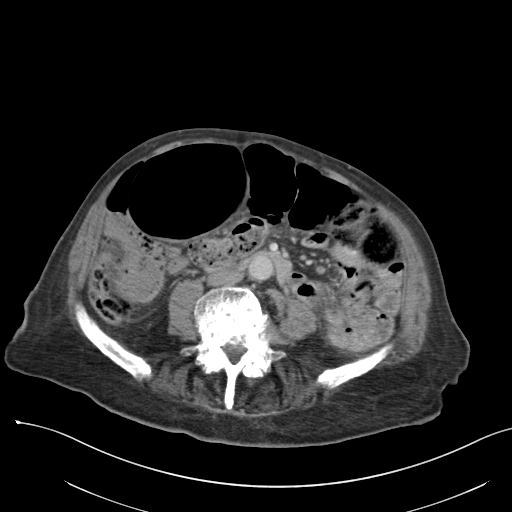
[im 51/89  soft-tissue]
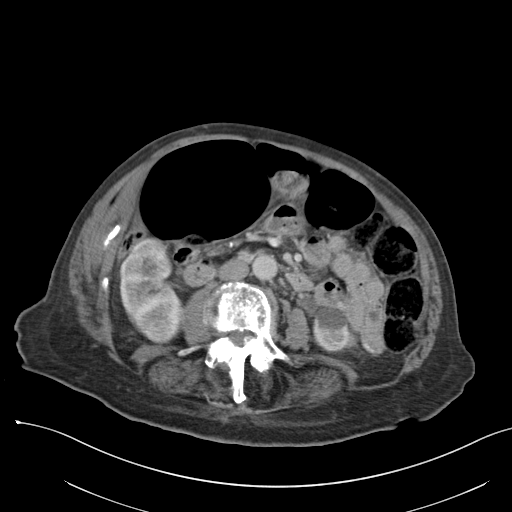
[im 57/89  soft-tissue]
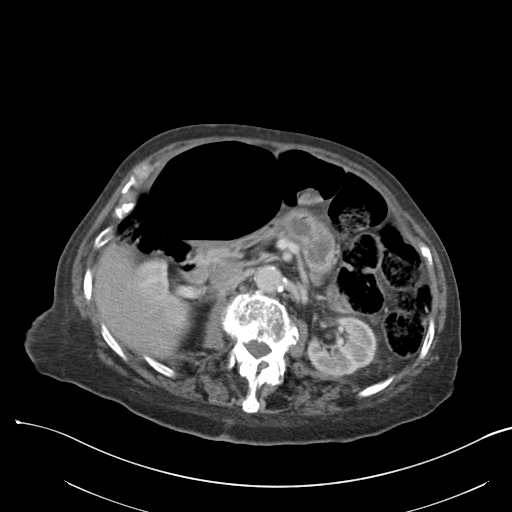
[im 57/89  bone]
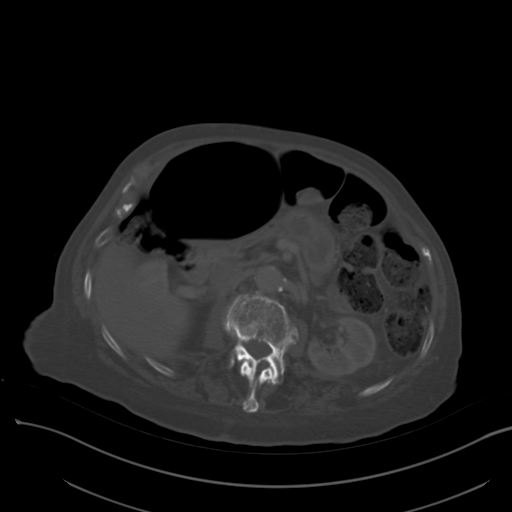
[im 63/89  soft-tissue]
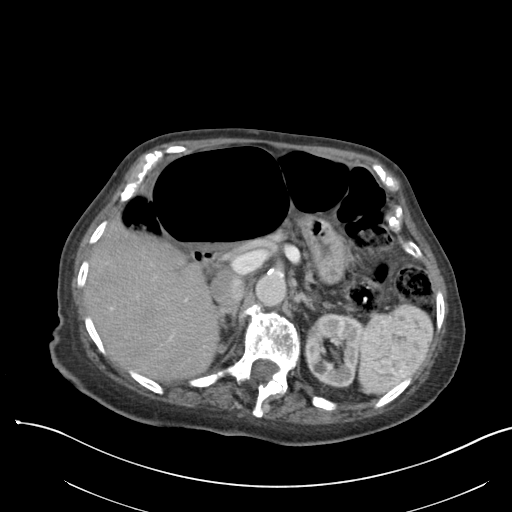
[im 70/89  soft-tissue]
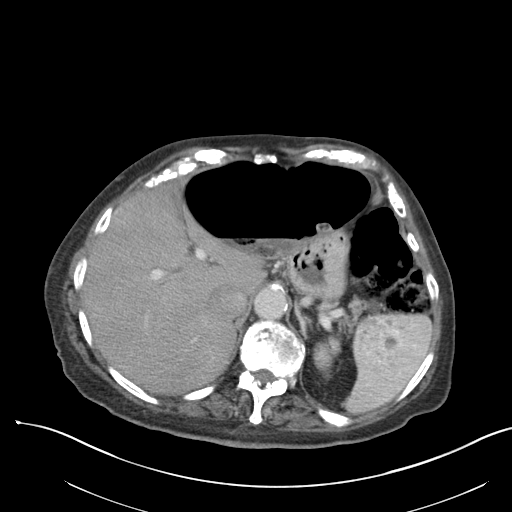
[im 76/89  soft-tissue]
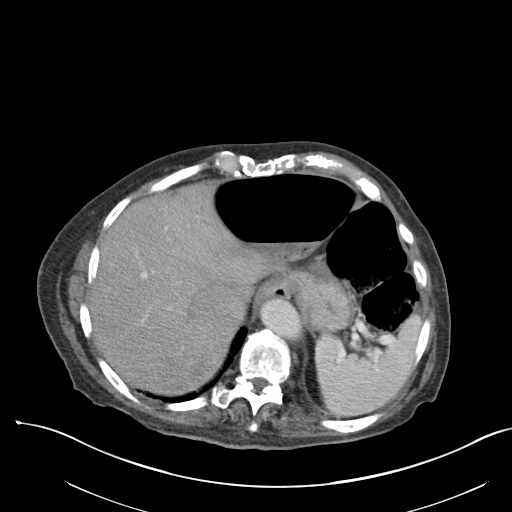
[im 82/89  soft-tissue]
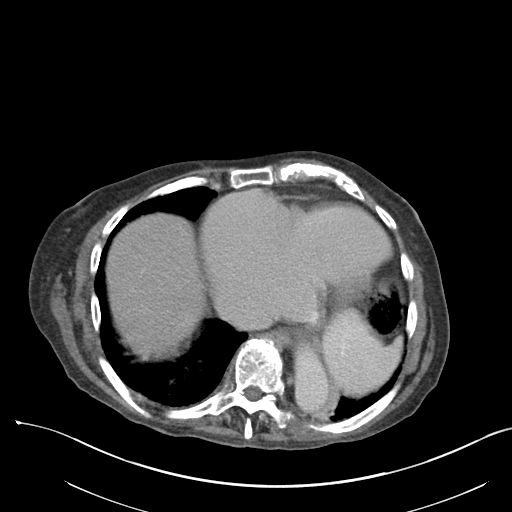

[Series 3: coronal a/|p · coronal · 0.78mm/px · 3 of 125 slices shown]
[im 42/125  soft-tissue]
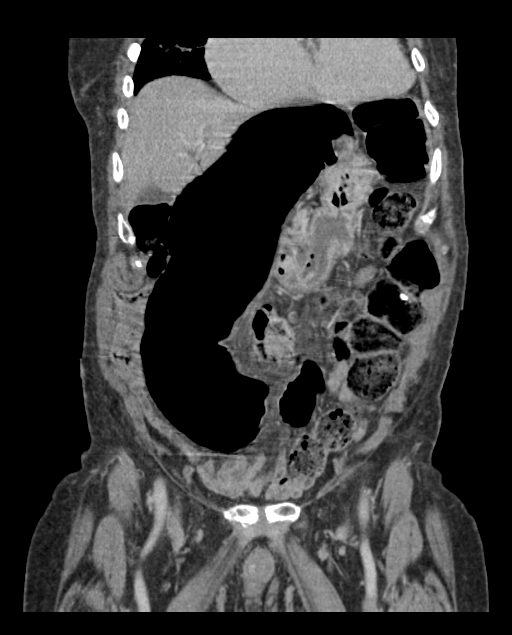
[im 56/125  soft-tissue]
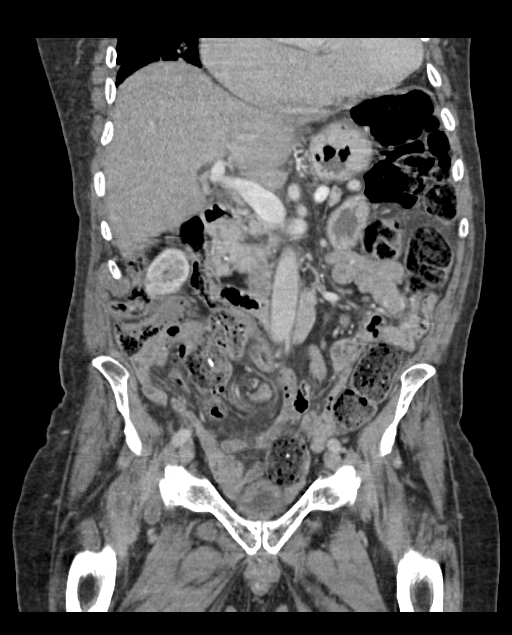
[im 69/125  soft-tissue]
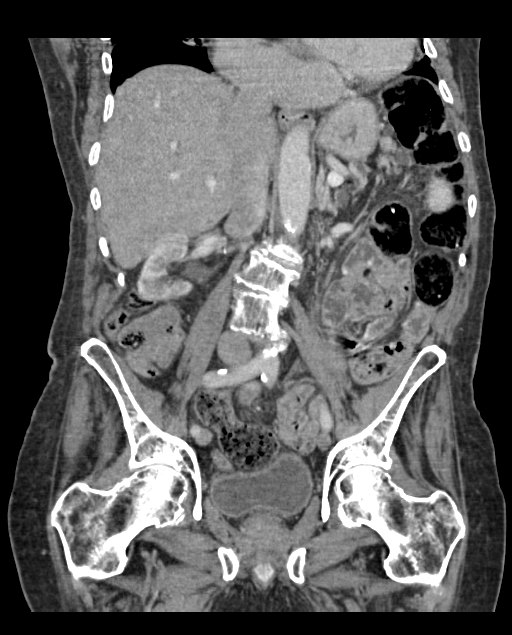

[16 of 46 positions shown; findings below may reference images not displayed]

FINDINGS: Lower chest: Subsegmental atelectasis in the lung bases. No pleural
effusion. Right atrial enlargement.

Hepatobiliary: No focal liver abnormality is seen. No gallstones,
gallbladder wall thickening, or biliary dilatation.

Pancreas: Diffuse fatty infiltration as previously seen.

Spleen: Similar appearance of approximately 7 cm heterogeneously
enhancing mass inferiorly in the spleen which is likely benign since
it was also present in 6110 and may represent a hemangioma.

Adrenals/Urinary Tract: Unremarkable adrenal glands. No evidence of
renal calculi or hydronephrosis. Unchanged hypodense lesions in the
left kidney which likely represent cysts and measure up to 2.7 cm in
the lower pole. Unremarkable bladder.

Stomach/Bowel: The stomach is within normal limits. There is marked
gaseous distension of redundant mid to distal sigmoid colon which
measures up to 11 cm in diameter. There is a swirled appearance of
the mesentery at the distal aspect of the dilated sigmoid loop with
abrupt transition to decompressed distal sigmoid and rectum. A
moderate amount of stool is present more proximally in the colon.
There is no small bowel dilatation.

Vascular/Lymphatic: Abdominal aortic atherosclerosis without
aneurysm. No enlarged lymph nodes.

Reproductive: Unremarkable prostate for age.

Other: No intraperitoneal free fluid or free air. No abdominal wall
mass or hernia.

Musculoskeletal: Thoracolumbar scoliosis with advanced diffuse disc
and facet degeneration.
IMPRESSION: Recurrent sigmoid volvulus.

Critical Value/emergent results were called by telephone at the time
of interpretation on 08/13/2016 at [DATE] to Dr. RAKEESH ROJI , who
verbally acknowledged these results.

## 2019-07-27 DEATH — deceased
